# Patient Record
Sex: Female | Born: 1935
Health system: Southern US, Community
[De-identification: ages and names within clinical notes are randomized; demographics above are authoritative.]

## PROBLEM LIST (undated history)

## (undated) DIAGNOSIS — E079 Disorder of thyroid, unspecified: Secondary | ICD-10-CM

## (undated) DIAGNOSIS — K219 Gastro-esophageal reflux disease without esophagitis: Secondary | ICD-10-CM

## (undated) DIAGNOSIS — N289 Disorder of kidney and ureter, unspecified: Secondary | ICD-10-CM

## (undated) DIAGNOSIS — D649 Anemia, unspecified: Secondary | ICD-10-CM

## (undated) DIAGNOSIS — K8020 Calculus of gallbladder without cholecystitis without obstruction: Secondary | ICD-10-CM

## (undated) DIAGNOSIS — I10 Essential (primary) hypertension: Secondary | ICD-10-CM

## (undated) DIAGNOSIS — N186 End stage renal disease: Secondary | ICD-10-CM

## (undated) DIAGNOSIS — I82409 Acute embolism and thrombosis of unspecified deep veins of unspecified lower extremity: Secondary | ICD-10-CM

## (undated) HISTORY — PX: COLONOSCOPY: SHX174

## (undated) HISTORY — DX: End stage renal disease: N18.6

## (undated) HISTORY — PX: LIPOMA EXCISION: SHX5283

## (undated) HISTORY — DX: Anemia, unspecified: D64.9

## (undated) HISTORY — DX: Acute embolism and thrombosis of unspecified deep veins of unspecified lower extremity: I82.409

## (undated) HISTORY — PX: EYE SURGERY: SHX253

## (undated) HISTORY — PX: DILATION AND CURETTAGE OF UTERUS: SHX78

## (undated) HISTORY — DX: Gastro-esophageal reflux disease without esophagitis: K21.9

---
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
## 2015-05-25 DIAGNOSIS — H524 Presbyopia: Secondary | ICD-10-CM | POA: Diagnosis not present

## 2015-05-25 DIAGNOSIS — H26493 Other secondary cataract, bilateral: Secondary | ICD-10-CM | POA: Diagnosis not present

## 2015-06-13 DIAGNOSIS — H26492 Other secondary cataract, left eye: Secondary | ICD-10-CM | POA: Diagnosis not present

## 2015-06-20 DIAGNOSIS — R5382 Chronic fatigue, unspecified: Secondary | ICD-10-CM | POA: Diagnosis not present

## 2015-06-20 DIAGNOSIS — E785 Hyperlipidemia, unspecified: Secondary | ICD-10-CM | POA: Diagnosis not present

## 2015-06-20 DIAGNOSIS — E039 Hypothyroidism, unspecified: Secondary | ICD-10-CM | POA: Diagnosis not present

## 2015-06-20 DIAGNOSIS — Z0000 Encounter for general adult medical examination without abnormal findings: Secondary | ICD-10-CM | POA: Diagnosis not present

## 2015-06-20 DIAGNOSIS — R001 Bradycardia, unspecified: Secondary | ICD-10-CM | POA: Diagnosis not present

## 2015-06-20 DIAGNOSIS — R05 Cough: Secondary | ICD-10-CM | POA: Diagnosis not present

## 2015-06-20 DIAGNOSIS — M129 Arthropathy, unspecified: Secondary | ICD-10-CM | POA: Diagnosis not present

## 2015-06-20 DIAGNOSIS — E559 Vitamin D deficiency, unspecified: Secondary | ICD-10-CM | POA: Diagnosis not present

## 2015-06-20 DIAGNOSIS — N3000 Acute cystitis without hematuria: Secondary | ICD-10-CM | POA: Diagnosis not present

## 2015-08-03 DIAGNOSIS — N184 Chronic kidney disease, stage 4 (severe): Secondary | ICD-10-CM | POA: Diagnosis not present

## 2015-08-03 DIAGNOSIS — I10 Essential (primary) hypertension: Secondary | ICD-10-CM | POA: Diagnosis not present

## 2015-08-03 DIAGNOSIS — E039 Hypothyroidism, unspecified: Secondary | ICD-10-CM | POA: Diagnosis not present

## 2015-08-03 DIAGNOSIS — D649 Anemia, unspecified: Secondary | ICD-10-CM | POA: Diagnosis not present

## 2015-08-07 DIAGNOSIS — N184 Chronic kidney disease, stage 4 (severe): Secondary | ICD-10-CM | POA: Diagnosis not present

## 2015-08-07 DIAGNOSIS — E211 Secondary hyperparathyroidism, not elsewhere classified: Secondary | ICD-10-CM | POA: Diagnosis not present

## 2015-08-07 DIAGNOSIS — I10 Essential (primary) hypertension: Secondary | ICD-10-CM | POA: Diagnosis not present

## 2015-11-07 ENCOUNTER — Emergency Department (HOSPITAL_COMMUNITY)
Admission: EM | Admit: 2015-11-07 | Discharge: 2015-11-07 | Disposition: A | Discharge status: Home / Self Care | Payer: Medicare Other | Attending: Emergency Medicine | Admitting: Emergency Medicine

## 2015-11-07 ENCOUNTER — Encounter (HOSPITAL_COMMUNITY): Payer: Self-pay | Admitting: *Deleted

## 2015-11-07 ENCOUNTER — Emergency Department (HOSPITAL_COMMUNITY): Payer: Medicare Other

## 2015-11-07 DIAGNOSIS — Z79899 Other long term (current) drug therapy: Secondary | ICD-10-CM | POA: Insufficient documentation

## 2015-11-07 DIAGNOSIS — N189 Chronic kidney disease, unspecified: Secondary | ICD-10-CM | POA: Insufficient documentation

## 2015-11-07 DIAGNOSIS — I129 Hypertensive chronic kidney disease with stage 1 through stage 4 chronic kidney disease, or unspecified chronic kidney disease: Secondary | ICD-10-CM | POA: Diagnosis not present

## 2015-11-07 DIAGNOSIS — R0602 Shortness of breath: Secondary | ICD-10-CM | POA: Diagnosis not present

## 2015-11-07 DIAGNOSIS — R05 Cough: Secondary | ICD-10-CM | POA: Diagnosis not present

## 2015-11-07 DIAGNOSIS — R059 Cough, unspecified: Secondary | ICD-10-CM

## 2015-11-07 DIAGNOSIS — R0989 Other specified symptoms and signs involving the circulatory and respiratory systems: Secondary | ICD-10-CM | POA: Diagnosis not present

## 2015-11-07 DIAGNOSIS — R002 Palpitations: Secondary | ICD-10-CM | POA: Diagnosis not present

## 2015-11-07 HISTORY — DX: Disorder of kidney and ureter, unspecified: N28.9

## 2015-11-07 HISTORY — DX: Essential (primary) hypertension: I10

## 2015-11-07 HISTORY — DX: Disorder of thyroid, unspecified: E07.9

## 2015-11-07 LAB — I-STAT CHEM 8, ED
BUN: 41 mg/dL — ABNORMAL HIGH (ref 6–20)
CHLORIDE: 101 mmol/L (ref 101–111)
Calcium, Ion: 1.19 mmol/L (ref 1.12–1.23)
Creatinine, Ser: 2.3 mg/dL — ABNORMAL HIGH (ref 0.44–1.00)
Glucose, Bld: 123 mg/dL — ABNORMAL HIGH (ref 65–99)
HEMATOCRIT: 32 % — AB (ref 36.0–46.0)
Hemoglobin: 10.9 g/dL — ABNORMAL LOW (ref 12.0–15.0)
POTASSIUM: 4.2 mmol/L (ref 3.5–5.1)
SODIUM: 132 mmol/L — AB (ref 135–145)
TCO2: 23 mmol/L (ref 0–100)

## 2015-11-07 LAB — CBC WITH DIFFERENTIAL/PLATELET
BASOS PCT: 0 %
Basophils Absolute: 0 10*3/uL (ref 0.0–0.1)
EOS ABS: 0.2 10*3/uL (ref 0.0–0.7)
Eosinophils Relative: 3 %
HCT: 30.4 % — ABNORMAL LOW (ref 36.0–46.0)
HEMOGLOBIN: 11 g/dL — AB (ref 12.0–15.0)
LYMPHS ABS: 2.4 10*3/uL (ref 0.7–4.0)
Lymphocytes Relative: 36 %
MCH: 32.5 pg (ref 26.0–34.0)
MCHC: 36.2 g/dL — ABNORMAL HIGH (ref 30.0–36.0)
MCV: 89.9 fL (ref 78.0–100.0)
Monocytes Absolute: 0.4 10*3/uL (ref 0.1–1.0)
Monocytes Relative: 6 %
NEUTROS PCT: 55 %
Neutro Abs: 3.7 10*3/uL (ref 1.7–7.7)
Platelets: 223 10*3/uL (ref 150–400)
RBC: 3.38 MIL/uL — AB (ref 3.87–5.11)
RDW: 12.9 % (ref 11.5–15.5)
WBC: 6.7 10*3/uL (ref 4.0–10.5)

## 2015-11-07 LAB — BRAIN NATRIURETIC PEPTIDE: B NATRIURETIC PEPTIDE 5: 145.1 pg/mL — AB (ref 0.0–100.0)

## 2015-11-07 LAB — T4, FREE: FREE T4: 1.28 ng/dL — AB (ref 0.61–1.12)

## 2015-11-07 LAB — I-STAT TROPONIN, ED: Troponin i, poc: 0.01 ng/mL (ref 0.00–0.08)

## 2015-11-07 LAB — TSH: TSH: 4.195 u[IU]/mL (ref 0.350–4.500)

## 2015-11-07 LAB — MAGNESIUM: MAGNESIUM: 2.3 mg/dL (ref 1.7–2.4)

## 2015-11-07 MED ORDER — IPRATROPIUM-ALBUTEROL 0.5-2.5 (3) MG/3ML IN SOLN
RESPIRATORY_TRACT | Status: AC
  Filled 2015-11-07: qty 3

## 2015-11-07 MED ORDER — IPRATROPIUM-ALBUTEROL 0.5-2.5 (3) MG/3ML IN SOLN
3.0000 mL | Freq: Once | RESPIRATORY_TRACT | Status: AC
  Administered 2015-11-07: 3 mL via RESPIRATORY_TRACT

## 2015-11-07 MED ORDER — IPRATROPIUM-ALBUTEROL 0.5-2.5 (3) MG/3ML IN SOLN
3.0000 mL | Freq: Once | RESPIRATORY_TRACT | Status: AC
  Administered 2015-11-07: 3 mL via RESPIRATORY_TRACT
  Filled 2015-11-07: qty 3

## 2015-11-07 MED ORDER — ALBUTEROL SULFATE (2.5 MG/3ML) 0.083% IN NEBU: 5.0000 mg | INHALATION_SOLUTION | Freq: Once | RESPIRATORY_TRACT | Status: DC

## 2015-11-07 MED ORDER — PREDNISONE 20 MG PO TABS: 60.0000 mg | ORAL_TABLET | Freq: Every day | ORAL | Status: DC

## 2015-11-07 MED ORDER — ALBUTEROL SULFATE HFA 108 (90 BASE) MCG/ACT IN AERS
2.0000 | INHALATION_SPRAY | Freq: Once | RESPIRATORY_TRACT | Status: AC
  Administered 2015-11-07: 2 via RESPIRATORY_TRACT
  Filled 2015-11-07: qty 6.7

## 2015-11-07 NOTE — ED Notes (Signed)
Pt states that she woke up from sleep this am around 1:15am feeling short of breath and having palpations; pt states that she feels like she cannot catch her breath and that her heart is racing

## 2015-11-07 NOTE — ED Provider Notes (Signed)
CSN: [id number]     Arrival date & time 11/07/15  0305 History   First MD Initiated Contact with Patient 11/07/15 0319     Chief Complaint  Patient presents with  . Shortness of Breath     (Consider location/radiation/quality/duration/timing/severity/associated sxs/prior Treatment) HPI  Yvonne Powell is a 82 y.o. female with PMH HTN, Graves Disease, presenting with palpitations, SOB and throat tightening.  This started all of a sudden while asleep.  States this occurred once before 2 yrs ago and resolved with breathing treatment.  She denies any chest pain, SOB, or recent infection. She is visiting from [place].  She denies heart attack or history of clots.  She denies food being stuck or any unusual foods last night.  There are no further complaints.  10 Systems reviewed and are negative for acute change except as noted in the HPI.     Past Medical History  Diagnosis Date  . Hypertension   . Renal disorder   . Thyroid disease     graves dx   History reviewed. No pertinent past surgical history. No family history on file. Social History  Substance Use Topics  . Smoking status: Never Smoker   . Smokeless tobacco: None  . Alcohol Use: No   OB History    No data available     Review of Systems    Allergies  Compazine; Iodine; Penicillins; and Sulfa antibiotics  Home Medications   Prior to Admission medications   Not on File   BP 162/62 mmHg  Pulse 73  Temp(Src) 97.4 F (36.3 C) (Oral)  Resp 15  SpO2 98% Physical Exam  Constitutional: She is oriented to person, place, and time. She appears well-developed and well-nourished. No distress.  HENT:  Head: Normocephalic and atraumatic.  Nose: Nose normal.  Mouth/Throat: Oropharynx is clear and moist. No oropharyngeal exudate.  Eyes: Conjunctivae and EOM are normal. Pupils are equal, round, and reactive to light. No scleral icterus.  Neck: Normal range of motion. Neck supple. No JVD present. No tracheal deviation present.  No thyromegaly present.  Cardiovascular: Normal rate, regular rhythm and normal heart sounds.  Exam reveals no gallop and no friction rub.   No murmur heard. Pulmonary/Chest: Effort normal and breath sounds normal. No respiratory distress. She has no wheezes. She exhibits no tenderness.  Abdominal: Soft. Bowel sounds are normal. She exhibits no distension and no mass. There is no tenderness. There is no rebound and no guarding.  Musculoskeletal: Normal range of motion. She exhibits no edema or tenderness.  Lymphadenopathy:    She has no cervical adenopathy.  Neurological: She is alert and oriented to person, place, and time. No cranial nerve deficit. She exhibits normal muscle tone.  Skin: Skin is warm and dry. No rash noted. No erythema. No pallor.  Nursing note and vitals reviewed.   ED Course  Procedures (including critical care time) Labs Review Labs Reviewed  CBC WITH DIFFERENTIAL/PLATELET - Abnormal; Notable for the following:    RBC 3.38 (*)    Hemoglobin 11.0 (*)    HCT 30.4 (*)    MCHC 36.2 (*)    All other components within normal limits  I-STAT CHEM 8, ED - Abnormal; Notable for the following:    Sodium 132 (*)    BUN 41 (*)    Creatinine, Ser 2.30 (*)    Glucose, Bld 123 (*)    Hemoglobin 10.9 (*)    HCT 32.0 (*)    All other components within normal limits  MAGNESIUM  BRAIN NATRIURETIC PEPTIDE  T4, FREE  TSH  I-STAT TROPOININ, ED    Imaging Review Dg Chest 2 View  11/07/2015  CLINICAL DATA:  Acute onset of shortness of breath and heart palpitations. Initial encounter. EXAM: CHEST  2 VIEW COMPARISON:  None. FINDINGS: The lungs are well-aerated and clear. There is no evidence of focal opacification, pleural effusion or pneumothorax. The heart is normal in size; the mediastinal contour is within normal limits. No acute osseous abnormalities are seen. IMPRESSION: No acute cardiopulmonary process seen. Electronically Signed   By: Garald Balding M.D.   On: 11/07/2015  03:32   I have personally reviewed and evaluated these images and lab results as part of my medical decision-making.   EKG Interpretation   Date/Time:  Wednesday November 07 2015 03:31:31 EDT Ventricular Rate:  83 PR Interval:    QRS Duration: 97 QT Interval:  421 QTC Calculation: 495 R Axis:   0 Text Interpretation:  Sinus rhythm Interpretation limited secondary to  artifact STD seen in prior EKG likely from artifact Confirmed by Glynn Octave [phone]) on 11/07/2015 3:42:23 AM      MDM   Final diagnoses:  None   Patient presents to the ED for throat tightening and SOB.  Will obtain CXR, EKG and cardiac work  Up.  She was also given breathing treatment as she states this made it better in the past.    EKG does not show any signs of ischemia.  CXR is negative. Labs are unremarkable as patient has a history of CKD.  She is requesting another treatment for her throat tightness.  Again, I see no airway involvement and there was no choking history to suggest FB.  She states she was dx with tracheitis last time and got better with albuterol inhaler. This will be provided as well as prednisone for 5 days.  She appears well and in NAD. VS remain within her normal limits and she is safe for DC.  Everlene Balls, MD 11/07/15 [id number]

## 2015-11-07 NOTE — Discharge Instructions (Signed)
Cough, Adult Yvonne Powell, use albuterol inhaler 2 puffs, every 6 hours as needed.  Take steroids daily for 5 days.  See a primary care doctor within 3 days for close follow up. If symptoms worsen, come back to the ED immediately. Thank you.  A cough helps to clear your throat and lungs. A cough may last only 2-3 weeks (acute), or it may last longer than 8 weeks (chronic). Many different things can cause a cough. A cough may be a sign of an illness or another medical condition. HOME CARE  Pay attention to any changes in your cough.  Take medicines only as told by your doctor.  If you were prescribed an antibiotic medicine, take it as told by your doctor. Do not stop taking it even if you start to feel better.  Talk with your doctor before you try using a cough medicine.  Drink enough fluid to keep your pee (urine) clear or pale yellow.  If the air is dry, use a cold steam vaporizer or humidifier in your home.  Stay away from things that make you cough at work or at home.  If your cough is worse at night, try using extra pillows to raise your head up higher while you sleep.  Do not smoke, and try not to be around smoke. If you need help quitting, ask your doctor.  Do not have caffeine.  Do not drink alcohol.  Rest as needed. GET HELP IF:  You have new problems (symptoms).  You cough up yellow fluid (pus).  Your cough does not get better after 2-3 weeks, or your cough gets worse.  Medicine does not help your cough and you are not sleeping well.  You have pain that gets worse or pain that is not helped with medicine.  You have a fever.  You are losing weight and you do not know why.  You have night sweats. GET HELP RIGHT AWAY IF:  You cough up blood.  You have trouble breathing.  Your heartbeat is very fast.   This information is not intended to replace advice given to you by your health care provider. Make sure you discuss any questions you have with your health care  provider.   Document Released: 01/02/2011 Document Revised: 01/10/2015 Document Reviewed: 06/28/2014 Elsevier Interactive Patient Education [employer].

## 2015-11-28 ENCOUNTER — Emergency Department (HOSPITAL_COMMUNITY): Payer: Medicare Other

## 2015-11-28 ENCOUNTER — Emergency Department (HOSPITAL_COMMUNITY)
Admission: EM | Admit: 2015-11-28 | Discharge: 2015-11-28 | Disposition: A | Discharge status: Home / Self Care | Payer: Medicare Other | Attending: Physician Assistant | Admitting: Physician Assistant

## 2015-11-28 ENCOUNTER — Encounter (HOSPITAL_COMMUNITY): Payer: Self-pay | Admitting: Emergency Medicine

## 2015-11-28 DIAGNOSIS — R0602 Shortness of breath: Secondary | ICD-10-CM | POA: Diagnosis not present

## 2015-11-28 DIAGNOSIS — Z79899 Other long term (current) drug therapy: Secondary | ICD-10-CM | POA: Diagnosis not present

## 2015-11-28 DIAGNOSIS — K219 Gastro-esophageal reflux disease without esophagitis: Secondary | ICD-10-CM | POA: Insufficient documentation

## 2015-11-28 DIAGNOSIS — R0789 Other chest pain: Secondary | ICD-10-CM | POA: Insufficient documentation

## 2015-11-28 DIAGNOSIS — I10 Essential (primary) hypertension: Secondary | ICD-10-CM | POA: Insufficient documentation

## 2015-11-28 LAB — CBC WITH DIFFERENTIAL/PLATELET
Basophils Absolute: 0 10*3/uL (ref 0.0–0.1)
Basophils Relative: 0 %
EOS ABS: 0.1 10*3/uL (ref 0.0–0.7)
EOS PCT: 1 %
HCT: 29.1 % — ABNORMAL LOW (ref 36.0–46.0)
Hemoglobin: 10.4 g/dL — ABNORMAL LOW (ref 12.0–15.0)
LYMPHS ABS: 1.6 10*3/uL (ref 0.7–4.0)
LYMPHS PCT: 29 %
MCH: 32.4 pg (ref 26.0–34.0)
MCHC: 35.7 g/dL (ref 30.0–36.0)
MCV: 90.7 fL (ref 78.0–100.0)
MONO ABS: 0.4 10*3/uL (ref 0.1–1.0)
MONOS PCT: 8 %
Neutro Abs: 3.4 10*3/uL (ref 1.7–7.7)
Neutrophils Relative %: 62 %
PLATELETS: 174 10*3/uL (ref 150–400)
RBC: 3.21 MIL/uL — AB (ref 3.87–5.11)
RDW: 13.3 % (ref 11.5–15.5)
WBC: 5.5 10*3/uL (ref 4.0–10.5)

## 2015-11-28 LAB — COMPREHENSIVE METABOLIC PANEL
ALT: 18 U/L (ref 14–54)
ANION GAP: 8 (ref 5–15)
AST: 18 U/L (ref 15–41)
Albumin: 3.7 g/dL (ref 3.5–5.0)
Alkaline Phosphatase: 50 U/L (ref 38–126)
BUN: 46 mg/dL — ABNORMAL HIGH (ref 6–20)
CHLORIDE: 105 mmol/L (ref 101–111)
CO2: 21 mmol/L — AB (ref 22–32)
CREATININE: 2.44 mg/dL — AB (ref 0.44–1.00)
Calcium: 9.2 mg/dL (ref 8.9–10.3)
GFR, EST AFRICAN AMERICAN: 20 mL/min — AB (ref >60)
GFR, EST NON AFRICAN AMERICAN: 17 mL/min — AB (ref >60)
Glucose, Bld: 106 mg/dL — ABNORMAL HIGH (ref 65–99)
POTASSIUM: 4.4 mmol/L (ref 3.5–5.1)
SODIUM: 134 mmol/L — AB (ref 135–145)
Total Bilirubin: 0.9 mg/dL (ref 0.3–1.2)
Total Protein: 5.9 g/dL — ABNORMAL LOW (ref 6.5–8.1)

## 2015-11-28 LAB — I-STAT TROPONIN, ED: TROPONIN I, POC: 0.01 ng/mL (ref 0.00–0.08)

## 2015-11-28 MED ORDER — OMEPRAZOLE 20 MG PO CPDR: 20.0000 mg | DELAYED_RELEASE_CAPSULE | Freq: Every day | ORAL | 0 refills | Status: DC

## 2015-11-28 MED ORDER — GI COCKTAIL ~~LOC~~
30.0000 mL | Freq: Once | ORAL | Status: AC
  Administered 2015-11-28: 30 mL via ORAL
  Filled 2015-11-28: qty 30

## 2015-11-28 NOTE — ED Triage Notes (Signed)
Pt states she woke up this morning and felt like her throat was closing up on her  Pt states the feeling has increased throughout the day  Pt states she has used her inhaler without relief   Pt states she was seen here on the 5th of July for palpitations  Pt states this feels different

## 2015-11-28 NOTE — ED Provider Notes (Signed)
[facility] DEPT Provider Note   CSN: [id number] Arrival date & time: 11/28/15  [id number]  First Provider Contact:  First MD Initiated Contact with Patient 11/28/15 2155        History   Chief Complaint Chief Complaint  Patient presents with  . Shortness of Breath    HPI Yvonne Powell is a 82 y.o. female.  HPI   Patient is a very pleasant 82 year old female presenting with feeling of tightness in her throat. She was here recently with the same thing. Patient reports that it wakes her up in the middle of the night. It is associated with that bad taste in her mouth and trouble swallowing. It is not associated with exercise or radiation to jaw or shoulder. It is been constant since this morning upon wakening.  Patient had workup in the emergency department on July 5 for this and nothing was found. Patient was discharged home. Patient is returning home to state where she lives on Monday and she has follow-up with her primary care physician scheduled for then.  Patient had no chest pain. No shortness of breath.  Past Medical History:  Diagnosis Date  . Hypertension   . Renal disorder   . Thyroid disease    graves dx    There are no active problems to display for this patient.   History reviewed. No pertinent surgical history.  OB History    No data available       Home Medications    Prior to Admission medications   Medication Sig Start Date End Date Taking? Authorizing Provider  calcitRIOL (ROCALTROL) 0.25 MCG capsule Take 0.25 mcg by mouth every other day.   Yes Historical Provider, MD  cetirizine (ZYRTEC) 10 MG tablet Take 10 mg by mouth daily.   Yes Historical Provider, MD  diltiazem (TIAZAC) 300 MG 24 hr capsule Take 300 mg by mouth daily.   Yes Historical Provider, MD  docusate sodium (COLACE) 100 MG capsule Take 100 mg by mouth 2 (two) times daily.   Yes Historical Provider, MD  doxazosin (CARDURA) 8 MG tablet Take 8 mg by mouth daily.   Yes Historical  Provider, MD  Iron-FA-DSS-B Cmplx-Vit C (NEPHRON FA PO) Take 1 tablet by mouth daily.   Yes Historical Provider, MD  levothyroxine (SYNTHROID, LEVOTHROID) 88 MCG tablet Take 88 mcg by mouth daily before breakfast.   Yes Historical Provider, MD  Vitamin D, Ergocalciferol, (DRISDOL) 50000 units CAPS capsule Take 50,000 Units by mouth every 30 (thirty) days.   Yes Historical Provider, MD  predniSONE (DELTASONE) 20 MG tablet Take 3 tablets (60 mg total) by mouth daily. Patient not taking: Reported on 11/28/2015 11/07/15   Everlene Balls, MD    Family History History reviewed. No pertinent family history.  Social History Social History  Substance Use Topics  . Smoking status: Never Smoker  . Smokeless tobacco: Never Used  . Alcohol use No     Allergies   Compazine [prochlorperazine edisylate]; Iodine; Sulfa antibiotics; and Penicillins   Review of Systems Review of Systems  Constitutional: Negative for chills and fever.  HENT: Positive for trouble swallowing. Negative for ear pain, sinus pressure and sore throat.   Eyes: Negative for pain and visual disturbance.  Respiratory: Negative for cough and shortness of breath.   Cardiovascular: Negative for chest pain and palpitations.  Gastrointestinal: Negative for abdominal pain and vomiting.  Genitourinary: Negative for dysuria and hematuria.  Musculoskeletal: Negative for arthralgias and back pain.  Skin: Negative for color change and  rash.  Neurological: Negative for seizures and syncope.  All other systems reviewed and are negative.    Physical Exam Updated Vital Signs BP 160/64 (BP Location: Right Arm)   Pulse 66   Temp 97.5 F (36.4 C) (Oral)   Resp 16   SpO2 99%   Physical Exam  Constitutional: She appears well-developed and well-nourished. No distress.  HENT:  Head: Normocephalic and atraumatic.  Eyes: Conjunctivae are normal.  Neck: Neck supple.  Cardiovascular: Normal rate and regular rhythm.   No murmur  heard. Pulmonary/Chest: Effort normal and breath sounds normal. No respiratory distress.  Abdominal: Soft. There is no tenderness.  Musculoskeletal: She exhibits no edema.  Neurological: She is alert.  Skin: Skin is warm and dry.  Psychiatric: She has a normal mood and affect.  Nursing note and vitals reviewed.    ED Treatments / Results  Labs (all labs ordered are listed, but only abnormal results are displayed) Labs Reviewed  CBC WITH DIFFERENTIAL/PLATELET - Abnormal; Notable for the following:       Result Value   RBC 3.21 (*)    Hemoglobin 10.4 (*)    HCT 29.1 (*)    All other components within normal limits  COMPREHENSIVE METABOLIC PANEL - Abnormal; Notable for the following:    Sodium 134 (*)    CO2 21 (*)    Glucose, Bld 106 (*)    BUN 46 (*)    Creatinine, Ser 2.44 (*)    Total Protein 5.9 (*)    GFR calc non Af Amer 17 (*)    GFR calc Af Amer 20 (*)    All other components within normal limits  I-STAT TROPOININ, ED    EKG  EKG Interpretation  Date/Time:  Wednesday November 28 2015 22:00:33 EDT Ventricular Rate:  65 PR Interval:    QRS Duration: 92 QT Interval:  408 QTC Calculation: 425 R Axis:   38 Text Interpretation:  Sinus rhythm Borderline prolonged PR interval No significant change since last tracing Confirmed by Gerald Leitz ([id number]) on 11/28/2015 10:08:31 PM       Radiology Dg Chest 2 View  Result Date: 11/28/2015 CLINICAL DATA:  82 year old female with shortness of breath EXAM: CHEST  2 VIEW COMPARISON:  Chest radiograph dated 11/07/2015 FINDINGS: Two views of the chest demonstrate emphysematous changes lower lungs with flattening of the diaphragm. There is no focal consolidation, pleural effusion, or pneumothorax. The cardiac silhouette is within normal limits. No acute osseous pathology. IMPRESSION: No active cardiopulmonary disease. Electronically Signed   By: Anner Crete M.D.   On: 11/28/2015 22:19   Procedures Procedures (including  critical care time)  Medications Ordered in ED Medications  gi cocktail (Maalox,Lidocaine,Donnatal) (30 mLs Oral Given 11/28/15 2221)     Initial Impression / Assessment and Plan / ED Course  I have reviewed the triage vital signs and the nursing notes.  Pertinent labs & imaging results that were available during my care of the patient were reviewed by me and considered in my medical decision making (see chart for details).  Clinical Course    Patient's very friendly 82 year old female presenting with atypical pain in her esophagus with trouble swallowing. It sounds to me as this is GERD given the location, and accompanied symptoms. Patient has not had any chest pain or any radiation. However given the fact that she is elderly female we will do single troponin. Her pain has been going on for 7 hours and anticipate being positive if it were true Cardiac  pain. Her EKG is normal. We'll treat her present really for GERD and have her follow-up with her primary care physician on Monday. Patient has a normal physical exam vital signs and baseline labs.  Final Clinical Impressions(s) / ED Diagnoses   Final diagnoses:  None    New Prescriptions New Prescriptions   No medications on file     La Dibella Julio Alm, MD 11/28/15 2321

## 2015-11-28 NOTE — Discharge Instructions (Signed)
We are unsure what is causing her symptoms. However it may be that you're having increased acid. We want you to take this pill for 2 weeks and monitor symptoms. Please follow-up with your primary care physician on Monday as planned.

## 2015-11-28 NOTE — ED Notes (Signed)
Patient transported to X-ray 

## 2015-11-28 NOTE — Progress Notes (Signed)
Patient listed as having Medicare insurance without a pcp.  EDCM spoke to patient at bedside.  Patient reports she is visiting from [place] and has a pcp there.  No further EDCM needs at this time.

## 2015-11-28 NOTE — ED Notes (Signed)
Pt having complaints of SOB. Pt able to talk without difficulty and sats remain at 100%-RA. 2L of O2 placed for pt comfort.

## 2015-12-07 DIAGNOSIS — K219 Gastro-esophageal reflux disease without esophagitis: Secondary | ICD-10-CM | POA: Diagnosis not present

## 2015-12-07 DIAGNOSIS — R1084 Generalized abdominal pain: Secondary | ICD-10-CM | POA: Diagnosis not present

## 2015-12-14 DIAGNOSIS — D649 Anemia, unspecified: Secondary | ICD-10-CM | POA: Diagnosis not present

## 2015-12-14 DIAGNOSIS — I10 Essential (primary) hypertension: Secondary | ICD-10-CM | POA: Diagnosis not present

## 2015-12-14 DIAGNOSIS — N2581 Secondary hyperparathyroidism of renal origin: Secondary | ICD-10-CM | POA: Diagnosis not present

## 2015-12-14 DIAGNOSIS — E039 Hypothyroidism, unspecified: Secondary | ICD-10-CM | POA: Diagnosis not present

## 2015-12-14 DIAGNOSIS — N184 Chronic kidney disease, stage 4 (severe): Secondary | ICD-10-CM | POA: Diagnosis not present

## 2015-12-18 DIAGNOSIS — E871 Hypo-osmolality and hyponatremia: Secondary | ICD-10-CM | POA: Diagnosis not present

## 2015-12-18 DIAGNOSIS — N184 Chronic kidney disease, stage 4 (severe): Secondary | ICD-10-CM | POA: Diagnosis not present

## 2015-12-18 DIAGNOSIS — I10 Essential (primary) hypertension: Secondary | ICD-10-CM | POA: Diagnosis not present

## 2016-02-16 DIAGNOSIS — J358 Other chronic diseases of tonsils and adenoids: Secondary | ICD-10-CM | POA: Diagnosis not present

## 2016-02-16 DIAGNOSIS — E039 Hypothyroidism, unspecified: Secondary | ICD-10-CM | POA: Diagnosis not present

## 2016-02-16 DIAGNOSIS — N183 Chronic kidney disease, stage 3 (moderate): Secondary | ICD-10-CM | POA: Diagnosis not present

## 2016-02-16 DIAGNOSIS — I10 Essential (primary) hypertension: Secondary | ICD-10-CM | POA: Diagnosis not present

## 2016-02-22 DIAGNOSIS — R002 Palpitations: Secondary | ICD-10-CM | POA: Diagnosis not present

## 2016-02-22 DIAGNOSIS — J209 Acute bronchitis, unspecified: Secondary | ICD-10-CM | POA: Diagnosis not present

## 2016-02-22 DIAGNOSIS — T7840XA Allergy, unspecified, initial encounter: Secondary | ICD-10-CM | POA: Diagnosis not present

## 2016-02-22 DIAGNOSIS — T364X5A Adverse effect of tetracyclines, initial encounter: Secondary | ICD-10-CM | POA: Diagnosis not present

## 2016-03-17 DIAGNOSIS — N2581 Secondary hyperparathyroidism of renal origin: Secondary | ICD-10-CM | POA: Diagnosis not present

## 2016-03-17 DIAGNOSIS — E559 Vitamin D deficiency, unspecified: Secondary | ICD-10-CM | POA: Diagnosis not present

## 2016-03-17 DIAGNOSIS — D509 Iron deficiency anemia, unspecified: Secondary | ICD-10-CM | POA: Diagnosis not present

## 2016-03-17 DIAGNOSIS — N184 Chronic kidney disease, stage 4 (severe): Secondary | ICD-10-CM | POA: Diagnosis not present

## 2016-03-17 DIAGNOSIS — N183 Chronic kidney disease, stage 3 (moderate): Secondary | ICD-10-CM | POA: Diagnosis not present

## 2016-03-17 DIAGNOSIS — I10 Essential (primary) hypertension: Secondary | ICD-10-CM | POA: Diagnosis not present

## 2016-03-17 DIAGNOSIS — E1121 Type 2 diabetes mellitus with diabetic nephropathy: Secondary | ICD-10-CM | POA: Diagnosis not present

## 2016-03-17 DIAGNOSIS — E785 Hyperlipidemia, unspecified: Secondary | ICD-10-CM | POA: Diagnosis not present

## 2016-03-20 DIAGNOSIS — I10 Essential (primary) hypertension: Secondary | ICD-10-CM | POA: Diagnosis not present

## 2016-03-20 DIAGNOSIS — N184 Chronic kidney disease, stage 4 (severe): Secondary | ICD-10-CM | POA: Diagnosis not present

## 2016-04-14 DIAGNOSIS — L2089 Other atopic dermatitis: Secondary | ICD-10-CM | POA: Diagnosis not present

## 2016-04-14 DIAGNOSIS — L2084 Intrinsic (allergic) eczema: Secondary | ICD-10-CM | POA: Diagnosis not present

## 2016-04-14 DIAGNOSIS — T7840XA Allergy, unspecified, initial encounter: Secondary | ICD-10-CM | POA: Diagnosis not present

## 2016-04-14 DIAGNOSIS — D485 Neoplasm of uncertain behavior of skin: Secondary | ICD-10-CM | POA: Diagnosis not present

## 2016-04-14 DIAGNOSIS — L570 Actinic keratosis: Secondary | ICD-10-CM | POA: Diagnosis not present

## 2016-04-14 DIAGNOSIS — L299 Pruritus, unspecified: Secondary | ICD-10-CM | POA: Diagnosis not present

## 2016-04-15 DIAGNOSIS — R239 Unspecified skin changes: Secondary | ICD-10-CM | POA: Diagnosis not present

## 2016-04-15 DIAGNOSIS — L820 Inflamed seborrheic keratosis: Secondary | ICD-10-CM | POA: Diagnosis not present

## 2016-04-29 DIAGNOSIS — L2089 Other atopic dermatitis: Secondary | ICD-10-CM | POA: Diagnosis not present

## 2016-04-29 DIAGNOSIS — T7840XA Allergy, unspecified, initial encounter: Secondary | ICD-10-CM | POA: Diagnosis not present

## 2016-04-29 DIAGNOSIS — L2084 Intrinsic (allergic) eczema: Secondary | ICD-10-CM | POA: Diagnosis not present

## 2016-04-29 DIAGNOSIS — L570 Actinic keratosis: Secondary | ICD-10-CM | POA: Diagnosis not present

## 2016-04-29 DIAGNOSIS — L299 Pruritus, unspecified: Secondary | ICD-10-CM | POA: Diagnosis not present

## 2016-04-29 DIAGNOSIS — D485 Neoplasm of uncertain behavior of skin: Secondary | ICD-10-CM | POA: Diagnosis not present

## 2016-05-05 HISTORY — PX: LUMBAR EPIDURAL INJECTION: SHX1980

## 2016-05-30 DIAGNOSIS — N184 Chronic kidney disease, stage 4 (severe): Secondary | ICD-10-CM | POA: Diagnosis not present

## 2016-05-30 DIAGNOSIS — E559 Vitamin D deficiency, unspecified: Secondary | ICD-10-CM | POA: Diagnosis not present

## 2016-05-30 DIAGNOSIS — E1121 Type 2 diabetes mellitus with diabetic nephropathy: Secondary | ICD-10-CM | POA: Diagnosis not present

## 2016-05-30 DIAGNOSIS — N183 Chronic kidney disease, stage 3 (moderate): Secondary | ICD-10-CM | POA: Diagnosis not present

## 2016-05-30 DIAGNOSIS — D509 Iron deficiency anemia, unspecified: Secondary | ICD-10-CM | POA: Diagnosis not present

## 2016-06-03 DIAGNOSIS — J019 Acute sinusitis, unspecified: Secondary | ICD-10-CM | POA: Diagnosis not present

## 2016-06-03 DIAGNOSIS — K1120 Sialoadenitis, unspecified: Secondary | ICD-10-CM | POA: Diagnosis not present

## 2016-06-03 DIAGNOSIS — J209 Acute bronchitis, unspecified: Secondary | ICD-10-CM | POA: Diagnosis not present

## 2016-06-04 DIAGNOSIS — I10 Essential (primary) hypertension: Secondary | ICD-10-CM | POA: Diagnosis not present

## 2016-06-04 DIAGNOSIS — N184 Chronic kidney disease, stage 4 (severe): Secondary | ICD-10-CM | POA: Diagnosis not present

## 2016-06-04 DIAGNOSIS — R809 Proteinuria, unspecified: Secondary | ICD-10-CM | POA: Diagnosis not present

## 2016-06-06 DIAGNOSIS — K1120 Sialoadenitis, unspecified: Secondary | ICD-10-CM | POA: Diagnosis not present

## 2016-06-06 DIAGNOSIS — J209 Acute bronchitis, unspecified: Secondary | ICD-10-CM | POA: Diagnosis not present

## 2016-06-06 DIAGNOSIS — J019 Acute sinusitis, unspecified: Secondary | ICD-10-CM | POA: Diagnosis not present

## 2016-06-24 DIAGNOSIS — N189 Chronic kidney disease, unspecified: Secondary | ICD-10-CM | POA: Diagnosis not present

## 2016-06-24 DIAGNOSIS — N281 Cyst of kidney, acquired: Secondary | ICD-10-CM | POA: Diagnosis not present

## 2016-07-01 DIAGNOSIS — N281 Cyst of kidney, acquired: Secondary | ICD-10-CM | POA: Diagnosis not present

## 2016-07-01 DIAGNOSIS — K8080 Other cholelithiasis without obstruction: Secondary | ICD-10-CM | POA: Diagnosis not present

## 2016-07-04 DIAGNOSIS — Z23 Encounter for immunization: Secondary | ICD-10-CM | POA: Diagnosis not present

## 2016-07-04 DIAGNOSIS — R001 Bradycardia, unspecified: Secondary | ICD-10-CM | POA: Diagnosis not present

## 2016-07-04 DIAGNOSIS — C649 Malignant neoplasm of unspecified kidney, except renal pelvis: Secondary | ICD-10-CM | POA: Diagnosis not present

## 2016-07-04 DIAGNOSIS — E559 Vitamin D deficiency, unspecified: Secondary | ICD-10-CM | POA: Diagnosis not present

## 2016-07-04 DIAGNOSIS — Z0000 Encounter for general adult medical examination without abnormal findings: Secondary | ICD-10-CM | POA: Diagnosis not present

## 2016-07-04 DIAGNOSIS — N183 Chronic kidney disease, stage 3 (moderate): Secondary | ICD-10-CM | POA: Diagnosis not present

## 2016-07-04 DIAGNOSIS — R05 Cough: Secondary | ICD-10-CM | POA: Diagnosis not present

## 2016-07-04 DIAGNOSIS — E039 Hypothyroidism, unspecified: Secondary | ICD-10-CM | POA: Diagnosis not present

## 2016-07-04 DIAGNOSIS — M810 Age-related osteoporosis without current pathological fracture: Secondary | ICD-10-CM | POA: Diagnosis not present

## 2016-07-04 DIAGNOSIS — E785 Hyperlipidemia, unspecified: Secondary | ICD-10-CM | POA: Diagnosis not present

## 2016-07-10 DIAGNOSIS — N2889 Other specified disorders of kidney and ureter: Secondary | ICD-10-CM | POA: Diagnosis not present

## 2016-07-10 DIAGNOSIS — R3129 Other microscopic hematuria: Secondary | ICD-10-CM | POA: Diagnosis not present

## 2016-07-14 DIAGNOSIS — L259 Unspecified contact dermatitis, unspecified cause: Secondary | ICD-10-CM | POA: Diagnosis not present

## 2016-07-14 DIAGNOSIS — L570 Actinic keratosis: Secondary | ICD-10-CM | POA: Diagnosis not present

## 2016-07-15 DIAGNOSIS — H26491 Other secondary cataract, right eye: Secondary | ICD-10-CM | POA: Diagnosis not present

## 2016-07-15 DIAGNOSIS — H04123 Dry eye syndrome of bilateral lacrimal glands: Secondary | ICD-10-CM | POA: Diagnosis not present

## 2016-07-15 DIAGNOSIS — H524 Presbyopia: Secondary | ICD-10-CM | POA: Diagnosis not present

## 2016-10-28 DIAGNOSIS — N184 Chronic kidney disease, stage 4 (severe): Secondary | ICD-10-CM | POA: Diagnosis not present

## 2016-10-28 DIAGNOSIS — D638 Anemia in other chronic diseases classified elsewhere: Secondary | ICD-10-CM | POA: Diagnosis not present

## 2016-10-28 DIAGNOSIS — M4716 Other spondylosis with myelopathy, lumbar region: Secondary | ICD-10-CM | POA: Diagnosis not present

## 2016-10-28 DIAGNOSIS — I10 Essential (primary) hypertension: Secondary | ICD-10-CM | POA: Diagnosis not present

## 2016-10-28 DIAGNOSIS — M1612 Unilateral primary osteoarthritis, left hip: Secondary | ICD-10-CM | POA: Diagnosis not present

## 2016-10-28 DIAGNOSIS — E559 Vitamin D deficiency, unspecified: Secondary | ICD-10-CM | POA: Diagnosis not present

## 2016-10-28 DIAGNOSIS — N2889 Other specified disorders of kidney and ureter: Secondary | ICD-10-CM | POA: Diagnosis not present

## 2016-11-25 DIAGNOSIS — I10 Essential (primary) hypertension: Secondary | ICD-10-CM | POA: Diagnosis not present

## 2016-11-25 DIAGNOSIS — N184 Chronic kidney disease, stage 4 (severe): Secondary | ICD-10-CM | POA: Diagnosis not present

## 2016-11-25 DIAGNOSIS — M1612 Unilateral primary osteoarthritis, left hip: Secondary | ICD-10-CM | POA: Diagnosis not present

## 2016-12-03 ENCOUNTER — Ambulatory Visit
Admission: RE | Admit: 2016-12-03 | Discharge: 2016-12-03 | Disposition: A | Discharge status: Home / Self Care | Payer: Medicare Other | Source: Ambulatory Visit | Attending: Internal Medicine | Admitting: Internal Medicine

## 2016-12-03 ENCOUNTER — Other Ambulatory Visit: Payer: Self-pay | Admitting: Internal Medicine

## 2016-12-03 DIAGNOSIS — R2230 Localized swelling, mass and lump, unspecified upper limb: Secondary | ICD-10-CM | POA: Diagnosis not present

## 2016-12-03 DIAGNOSIS — M79632 Pain in left forearm: Secondary | ICD-10-CM | POA: Diagnosis not present

## 2016-12-03 DIAGNOSIS — N184 Chronic kidney disease, stage 4 (severe): Secondary | ICD-10-CM | POA: Diagnosis not present

## 2016-12-03 DIAGNOSIS — N2581 Secondary hyperparathyroidism of renal origin: Secondary | ICD-10-CM | POA: Diagnosis not present

## 2016-12-03 DIAGNOSIS — I10 Essential (primary) hypertension: Secondary | ICD-10-CM | POA: Diagnosis not present

## 2016-12-03 DIAGNOSIS — D638 Anemia in other chronic diseases classified elsewhere: Secondary | ICD-10-CM | POA: Diagnosis not present

## 2016-12-03 DIAGNOSIS — M19042 Primary osteoarthritis, left hand: Secondary | ICD-10-CM | POA: Diagnosis not present

## 2016-12-03 DIAGNOSIS — D631 Anemia in chronic kidney disease: Secondary | ICD-10-CM | POA: Diagnosis not present

## 2017-02-02 DIAGNOSIS — D631 Anemia in chronic kidney disease: Secondary | ICD-10-CM | POA: Diagnosis not present

## 2017-02-02 DIAGNOSIS — N184 Chronic kidney disease, stage 4 (severe): Secondary | ICD-10-CM | POA: Diagnosis not present

## 2017-02-02 DIAGNOSIS — N281 Cyst of kidney, acquired: Secondary | ICD-10-CM | POA: Diagnosis not present

## 2017-02-02 DIAGNOSIS — Z23 Encounter for immunization: Secondary | ICD-10-CM | POA: Diagnosis not present

## 2017-02-02 DIAGNOSIS — N2581 Secondary hyperparathyroidism of renal origin: Secondary | ICD-10-CM | POA: Diagnosis not present

## 2017-02-12 ENCOUNTER — Other Ambulatory Visit: Payer: Self-pay

## 2017-02-12 DIAGNOSIS — N185 Chronic kidney disease, stage 5: Secondary | ICD-10-CM

## 2017-03-12 ENCOUNTER — Ambulatory Visit (HOSPITAL_COMMUNITY)
Admission: RE | Admit: 2017-03-12 | Discharge: 2017-03-12 | Disposition: A | Discharge status: Home / Self Care | Payer: Medicare Other | Source: Ambulatory Visit | Attending: Vascular Surgery | Admitting: Vascular Surgery

## 2017-03-12 ENCOUNTER — Encounter: Payer: Self-pay | Admitting: Vascular Surgery

## 2017-03-12 ENCOUNTER — Ambulatory Visit (INDEPENDENT_AMBULATORY_CARE_PROVIDER_SITE_OTHER): Payer: Medicare Other | Admitting: Vascular Surgery

## 2017-03-12 ENCOUNTER — Encounter: Payer: Self-pay | Admitting: *Deleted

## 2017-03-12 ENCOUNTER — Ambulatory Visit (INDEPENDENT_AMBULATORY_CARE_PROVIDER_SITE_OTHER)
Admission: RE | Admit: 2017-03-12 | Discharge: 2017-03-12 | Disposition: A | Discharge status: Home / Self Care | Payer: Medicare Other | Source: Ambulatory Visit | Attending: Vascular Surgery | Admitting: Vascular Surgery

## 2017-03-12 VITALS — BP 164/68 | HR 67 | Temp 96.7°F | Resp 18 | Wt 129.2 lb

## 2017-03-12 DIAGNOSIS — N185 Chronic kidney disease, stage 5: Secondary | ICD-10-CM | POA: Insufficient documentation

## 2017-03-12 DIAGNOSIS — N184 Chronic kidney disease, stage 4 (severe): Secondary | ICD-10-CM

## 2017-03-12 NOTE — H&P (View-Only) (Signed)
Referring Physician: Dr Hollie Salk Patient name: Yvonne Powell MRN: [id number] DOB: 1928/07/12 Sex: female  REASON FOR CONSULT: Hemodialysis access  HPI: Yvonne Powell is a 83 y.o. female referred for placement of long-term hemodialysis access. The patient is left-handed. She has had no prior access procedures. She is not currently on hemodialysis. Her renal failure is thought to be secondary to hypertension. She is CKD4.  Past Medical History:  Diagnosis Date  . Anemia   . DVT (deep venous thrombosis) ([place])   . Hypertension   . Renal disorder   . Thyroid disease    graves dx   History reviewed. No pertinent surgical history.  History reviewed. No pertinent family history.  SOCIAL HISTORY: Social History   Socioeconomic History  . Marital status: Divorced    Spouse name: Not on file  . Number of children: Not on file  . Years of education: Not on file  . Highest education level: Not on file  Social Needs  . Financial resource strain: Not on file  . Food insecurity - worry: Not on file  . Food insecurity - inability: Not on file  . Transportation needs - medical: Not on file  . Transportation needs - non-medical: Not on file  Occupational History  . Not on file  Tobacco Use  . Smoking status: Never Smoker  . Smokeless tobacco: Never Used  Substance and Sexual Activity  . Alcohol use: No  . Drug use: No  . Sexual activity: Not on file  Other Topics Concern  . Not on file  Social History Narrative  . Not on file    Current Outpatient Medications  Medication Sig Dispense Refill  . calcitRIOL (ROCALTROL) 0.25 MCG capsule Take 0.25 mcg by mouth every other day.    . diltiazem (TIAZAC) 300 MG 24 hr capsule Take 300 mg by mouth daily.    Marland Kitchen doxazosin (CARDURA) 8 MG tablet Take 8 mg by mouth daily.    . Iron-FA-DSS-B Cmplx-Vit C (NEPHRON FA PO) Take 1 tablet by mouth daily.    Marland Kitchen levothyroxine (SYNTHROID, LEVOTHROID) 88 MCG tablet Take 88 mcg by mouth daily before  breakfast.    . Vitamin D, Ergocalciferol, (DRISDOL) 50000 units CAPS capsule Take 50,000 Units by mouth every 30 (thirty) days.    . cetirizine (ZYRTEC) 10 MG tablet Take 10 mg by mouth daily.    Marland Kitchen docusate sodium (COLACE) 100 MG capsule Take 100 mg by mouth 2 (two) times daily.    Marland Kitchen omeprazole (PRILOSEC) 20 MG capsule Take 1 capsule (20 mg total) by mouth daily. (Patient not taking: Reported on 03/12/2017) 20 capsule 0  . predniSONE (DELTASONE) 20 MG tablet Take 3 tablets (60 mg total) by mouth daily. (Patient not taking: Reported on 11/28/2015) 15 tablet 0   No current facility-administered medications for this visit.     ROS:   General:  No weight loss, Fever, chills  HEENT: No recent headaches, no nasal bleeding, no visual changes, no sore throat  Neurologic: No dizziness, blackouts, seizures. No recent symptoms of stroke or mini- stroke. No recent episodes of slurred speech, or temporary blindness.  Cardiac: No recent episodes of chest pain/pressure, no shortness of breath at rest.  + shortness of breath with exertion.  Denies history of atrial fibrillation or irregular heartbeat  Vascular: No history of rest pain in feet.  No history of claudication.  No history of non-healing ulcer, +history of DVT   Pulmonary: No home oxygen, no productive cough, no hemoptysis,  No asthma or wheezing  Musculoskeletal:  [ ]  Arthritis, [ ]  Low back pain,  [ ]  Joint pain  Hematologic:No history of hypercoagulable state.  No history of easy bleeding.  + history of anemia  Gastrointestinal: No hematochezia or melena,  No gastroesophageal reflux, no trouble swallowing  Urinary: [X]  chronic Kidney disease, [ ]  on HD - [ ]  MWF or [ ]  TTHS, [ ]  Burning with urination, [ ]  Frequent urination, [ ]  Difficulty urinating;   Skin: No rashes  Psychological: No history of anxiety,  No history of depression   Physical Examination  Vitals:   03/12/17 1243 03/12/17 1244  BP: (!) 162/67 (!) 164/68  Pulse:  67   Resp: 18   Temp: (!) 96.7 F (35.9 C)   TempSrc: Oral   SpO2: 98%   Weight: 129 lb 3.2 oz (58.6 kg)     There is no height or weight on file to calculate BMI.  General:  Alert and oriented, no acute distress Cardiac: Regular Rate and Rhythm  Extremity Pulses:  2+ radial, brachial pulses bilaterally Musculoskeletal: No deformity or edema  Neurologic: Upper and lower extremity motor 5/5 and symmetric  DATA:  Patient had bilateral upper extremity vein mapping as well as approximate arterial duplex exam. Radial artery was 3 mm on the left 2 on the right brachial artery was 4 mm bilaterally upper arm cephalic vein was 3 mm on the right poor quality in the forearm right basilic vein was 4-5 mm. On the left side the left cephalic vein was 3 mm throughout most of its course except at the wrist where it dropped down to 2 mm the basilic vein was 3-4 mm  ASSESSMENT:  Patient needs long-term hemodialysis access. Since she is left-handed we will go in the nondominant arm first. She has an upper arm cephalic vein that appears to be of good quality.   PLAN:  Right brachiocephalic AV fistula planned for 04/07/2017 since the patient wanted to wait until after Thanksgiving. Risks benefits possible complications and procedure details including but not limited to bleeding infection non-maturation of the fistula ischemic steal were all explained to the patient today. She understands and agrees to proceed.   [name], MD [facility] Office: [phone] Pager: [phone]

## 2017-03-12 NOTE — Progress Notes (Signed)
Referring Physician: Dr Hollie Salk Patient name: Yvonne Powell MRN: [id number] DOB: 1928-06-25 Sex: female  REASON FOR CONSULT: Hemodialysis access  HPI: Yvonne Powell is a 83 y.o. female referred for placement of long-term hemodialysis access. The patient is left-handed. She has had no prior access procedures. She is not currently on hemodialysis. Her renal failure is thought to be secondary to hypertension. She is CKD4.  Past Medical History:  Diagnosis Date  . Anemia   . DVT (deep venous thrombosis) ([facility])   . Hypertension   . Renal disorder   . Thyroid disease    graves dx   History reviewed. No pertinent surgical history.  History reviewed. No pertinent family history.  SOCIAL HISTORY: Social History   Socioeconomic History  . Marital status: Divorced    Spouse name: Not on file  . Number of children: Not on file  . Years of education: Not on file  . Highest education level: Not on file  Social Needs  . Financial resource strain: Not on file  . Food insecurity - worry: Not on file  . Food insecurity - inability: Not on file  . Transportation needs - medical: Not on file  . Transportation needs - non-medical: Not on file  Occupational History  . Not on file  Tobacco Use  . Smoking status: Never Smoker  . Smokeless tobacco: Never Used  Substance and Sexual Activity  . Alcohol use: No  . Drug use: No  . Sexual activity: Not on file  Other Topics Concern  . Not on file  Social History Narrative  . Not on file    Current Outpatient Medications  Medication Sig Dispense Refill  . calcitRIOL (ROCALTROL) 0.25 MCG capsule Take 0.25 mcg by mouth every other day.    . diltiazem (TIAZAC) 300 MG 24 hr capsule Take 300 mg by mouth daily.    Marland Kitchen doxazosin (CARDURA) 8 MG tablet Take 8 mg by mouth daily.    . Iron-FA-DSS-B Cmplx-Vit C (NEPHRON FA PO) Take 1 tablet by mouth daily.    Marland Kitchen levothyroxine (SYNTHROID, LEVOTHROID) 88 MCG tablet Take 88 mcg by mouth daily before  breakfast.    . Vitamin D, Ergocalciferol, (DRISDOL) 50000 units CAPS capsule Take 50,000 Units by mouth every 30 (thirty) days.    . cetirizine (ZYRTEC) 10 MG tablet Take 10 mg by mouth daily.    Marland Kitchen docusate sodium (COLACE) 100 MG capsule Take 100 mg by mouth 2 (two) times daily.    Marland Kitchen omeprazole (PRILOSEC) 20 MG capsule Take 1 capsule (20 mg total) by mouth daily. (Patient not taking: Reported on 03/12/2017) 20 capsule 0  . predniSONE (DELTASONE) 20 MG tablet Take 3 tablets (60 mg total) by mouth daily. (Patient not taking: Reported on 11/28/2015) 15 tablet 0   No current facility-administered medications for this visit.     ROS:   General:  No weight loss, Fever, chills  HEENT: No recent headaches, no nasal bleeding, no visual changes, no sore throat  Neurologic: No dizziness, blackouts, seizures. No recent symptoms of stroke or mini- stroke. No recent episodes of slurred speech, or temporary blindness.  Cardiac: No recent episodes of chest pain/pressure, no shortness of breath at rest.  + shortness of breath with exertion.  Denies history of atrial fibrillation or irregular heartbeat  Vascular: No history of rest pain in feet.  No history of claudication.  No history of non-healing ulcer, +history of DVT   Pulmonary: No home oxygen, no productive cough, no hemoptysis,  No asthma or wheezing  Musculoskeletal:  [ ]  Arthritis, [ ]  Low back pain,  [ ]  Joint pain  Hematologic:No history of hypercoagulable state.  No history of easy bleeding.  + history of anemia  Gastrointestinal: No hematochezia or melena,  No gastroesophageal reflux, no trouble swallowing  Urinary: [X]  chronic Kidney disease, [ ]  on HD - [ ]  MWF or [ ]  TTHS, [ ]  Burning with urination, [ ]  Frequent urination, [ ]  Difficulty urinating;   Skin: No rashes  Psychological: No history of anxiety,  No history of depression   Physical Examination  Vitals:   03/12/17 1243 03/12/17 1244  BP: (!) 162/67 (!) 164/68  Pulse:  67   Resp: 18   Temp: (!) 96.7 F (35.9 C)   TempSrc: Oral   SpO2: 98%   Weight: 129 lb 3.2 oz (58.6 kg)     There is no height or weight on file to calculate BMI.  General:  Alert and oriented, no acute distress Cardiac: Regular Rate and Rhythm  Extremity Pulses:  2+ radial, brachial pulses bilaterally Musculoskeletal: No deformity or edema  Neurologic: Upper and lower extremity motor 5/5 and symmetric  DATA:  Patient had bilateral upper extremity vein mapping as well as approximate arterial duplex exam. Radial artery was 3 mm on the left 2 on the right brachial artery was 4 mm bilaterally upper arm cephalic vein was 3 mm on the right poor quality in the forearm right basilic vein was 4-5 mm. On the left side the left cephalic vein was 3 mm throughout most of its course except at the wrist where it dropped down to 2 mm the basilic vein was 3-4 mm  ASSESSMENT:  Patient needs long-term hemodialysis access. Since she is left-handed we will go in the nondominant arm first. She has an upper arm cephalic vein that appears to be of good quality.   PLAN:  Right brachiocephalic AV fistula planned for 04/07/2017 since the patient wanted to wait until after Thanksgiving. Risks benefits possible complications and procedure details including but not limited to bleeding infection non-maturation of the fistula ischemic steal were all explained to the patient today. She understands and agrees to proceed.   [name], MD [facility] Office: [phone] Pager: [phone]

## 2017-03-13 ENCOUNTER — Encounter: Payer: Self-pay | Admitting: Nephrology

## 2017-04-01 ENCOUNTER — Other Ambulatory Visit: Payer: Self-pay | Admitting: *Deleted

## 2017-04-03 ENCOUNTER — Encounter (HOSPITAL_COMMUNITY): Payer: Self-pay | Admitting: *Deleted

## 2017-04-03 ENCOUNTER — Other Ambulatory Visit: Payer: Self-pay

## 2017-04-03 NOTE — Progress Notes (Signed)
Gave patient pre-op instructions patient verbalized understanding of instructions and had not further questions.  7 days prior to surgery STOP taking any Aspirin(unless otherwise instructed by your surgeon), Aleve, Naproxen, Ibuprofen, Motrin, Advil, Goody's, BC's, all herbal medications, fish oil, and all vitamins

## 2017-04-06 ENCOUNTER — Encounter (HOSPITAL_COMMUNITY): Payer: Self-pay | Admitting: Anesthesiology

## 2017-04-06 MED ORDER — SODIUM CHLORIDE 0.9 % IV SOLN
1250.0000 mg | INTRAVENOUS | Status: DC
  Filled 2017-04-06: qty 1250

## 2017-04-06 NOTE — Anesthesia Preprocedure Evaluation (Addendum)
Anesthesia Evaluation  Patient identified by MRN, date of birth, ID band Patient awake    Reviewed: Allergy & Precautions, NPO status , Patient's Chart, lab work & pertinent test results  Airway Mallampati: III  TM Distance: >3 FB Neck ROM: Full    Dental  (+) Teeth Intact   Pulmonary neg pulmonary ROS,    Pulmonary exam normal breath sounds clear to auscultation       Cardiovascular hypertension, Pt. on medications Normal cardiovascular exam Rhythm:Regular Rate:Normal     Neuro/Psych negative neurological ROS  negative psych ROS   GI/Hepatic negative GI ROS, Neg liver ROS,   Endo/Other  negative endocrine ROS  Renal/GU ESRFRenal disease  negative genitourinary   Musculoskeletal Low back pain    Abdominal   Peds  Hematology  (+) anemia ,   Anesthesia Other Findings   Reproductive/Obstetrics                            Anesthesia Physical Anesthesia Plan  ASA: III  Anesthesia Plan: MAC   Post-op Pain Management:    Induction: Intravenous  PONV Risk Score and Plan: 2 and Ondansetron and Propofol infusion  Airway Management Planned: Natural Airway, Nasal Cannula and Simple Face Mask  Additional Equipment:   Intra-op Plan:   Post-operative Plan:   Informed Consent: I have reviewed the patients History and Physical, chart, labs and discussed the procedure including the risks, benefits and alternatives for the proposed anesthesia with the patient or authorized representative who has indicated his/her understanding and acceptance.   Dental advisory given  Plan Discussed with: CRNA, Anesthesiologist and Surgeon  Anesthesia Plan Comments:        Anesthesia Quick Evaluation

## 2017-04-07 ENCOUNTER — Ambulatory Visit (HOSPITAL_COMMUNITY): Payer: Medicare Other | Admitting: Certified Registered Nurse Anesthetist

## 2017-04-07 ENCOUNTER — Other Ambulatory Visit: Payer: Self-pay

## 2017-04-07 ENCOUNTER — Encounter (HOSPITAL_COMMUNITY): Payer: Self-pay | Admitting: *Deleted

## 2017-04-07 ENCOUNTER — Encounter (HOSPITAL_COMMUNITY)
Admission: RE | Disposition: A | Discharge status: Home Health | Payer: Self-pay | Source: Ambulatory Visit | Attending: Vascular Surgery

## 2017-04-07 ENCOUNTER — Telehealth: Payer: Self-pay | Admitting: Vascular Surgery

## 2017-04-07 ENCOUNTER — Ambulatory Visit (HOSPITAL_COMMUNITY)
Admission: RE | Admit: 2017-04-07 | Discharge: 2017-04-07 | Disposition: A | Discharge status: Home Health | Payer: Medicare Other | Source: Ambulatory Visit | Attending: Vascular Surgery | Admitting: Vascular Surgery

## 2017-04-07 DIAGNOSIS — M545 Low back pain: Secondary | ICD-10-CM | POA: Insufficient documentation

## 2017-04-07 DIAGNOSIS — I129 Hypertensive chronic kidney disease with stage 1 through stage 4 chronic kidney disease, or unspecified chronic kidney disease: Secondary | ICD-10-CM | POA: Insufficient documentation

## 2017-04-07 DIAGNOSIS — I120 Hypertensive chronic kidney disease with stage 5 chronic kidney disease or end stage renal disease: Secondary | ICD-10-CM | POA: Diagnosis not present

## 2017-04-07 DIAGNOSIS — Z86718 Personal history of other venous thrombosis and embolism: Secondary | ICD-10-CM | POA: Insufficient documentation

## 2017-04-07 DIAGNOSIS — Z79899 Other long term (current) drug therapy: Secondary | ICD-10-CM | POA: Diagnosis not present

## 2017-04-07 DIAGNOSIS — D631 Anemia in chronic kidney disease: Secondary | ICD-10-CM | POA: Insufficient documentation

## 2017-04-07 DIAGNOSIS — E0500 Thyrotoxicosis with diffuse goiter without thyrotoxic crisis or storm: Secondary | ICD-10-CM | POA: Diagnosis not present

## 2017-04-07 DIAGNOSIS — D649 Anemia, unspecified: Secondary | ICD-10-CM | POA: Diagnosis not present

## 2017-04-07 DIAGNOSIS — N184 Chronic kidney disease, stage 4 (severe): Secondary | ICD-10-CM | POA: Insufficient documentation

## 2017-04-07 DIAGNOSIS — N186 End stage renal disease: Secondary | ICD-10-CM | POA: Diagnosis not present

## 2017-04-07 HISTORY — PX: AV FISTULA PLACEMENT: SHX1204

## 2017-04-07 LAB — POCT I-STAT 4, (NA,K, GLUC, HGB,HCT)
Glucose, Bld: 110 mg/dL — ABNORMAL HIGH (ref 65–99)
HCT: 28 % — ABNORMAL LOW (ref 36.0–46.0)
HEMOGLOBIN: 9.5 g/dL — AB (ref 12.0–15.0)
POTASSIUM: 4.6 mmol/L (ref 3.5–5.1)
Sodium: 140 mmol/L (ref 135–145)

## 2017-04-07 SURGERY — ARTERIOVENOUS (AV) FISTULA CREATION
Anesthesia: Monitor Anesthesia Care | Site: Arm Upper | Laterality: Right

## 2017-04-07 MED ORDER — LIDOCAINE HCL 1 % IJ SOLN
INTRAMUSCULAR | Status: AC
  Filled 2017-04-07: qty 20

## 2017-04-07 MED ORDER — SODIUM CHLORIDE 0.9 % IV SOLN
INTRAVENOUS | Status: DC | PRN
  Administered 2017-04-07: 1250 mg via INTRAVENOUS

## 2017-04-07 MED ORDER — ONDANSETRON HCL 4 MG/2ML IJ SOLN
INTRAMUSCULAR | Status: AC
  Filled 2017-04-07: qty 2

## 2017-04-07 MED ORDER — OXYCODONE-ACETAMINOPHEN 5-325 MG PO TABS: 1.0000 | ORAL_TABLET | Freq: Three times a day (TID) | ORAL | 0 refills | Status: DC | PRN

## 2017-04-07 MED ORDER — HEPARIN SODIUM (PORCINE) 1000 UNIT/ML IJ SOLN
INTRAMUSCULAR | Status: DC | PRN
  Administered 2017-04-07: 3000 [IU] via INTRAVENOUS

## 2017-04-07 MED ORDER — OXYCODONE HCL 5 MG PO TABS: 5.0000 mg | ORAL_TABLET | Freq: Once | ORAL | Status: DC | PRN

## 2017-04-07 MED ORDER — FENTANYL CITRATE (PF) 100 MCG/2ML IJ SOLN
INTRAMUSCULAR | Status: DC | PRN
  Administered 2017-04-07 (×4): 25 ug via INTRAVENOUS

## 2017-04-07 MED ORDER — ONDANSETRON HCL 4 MG/2ML IJ SOLN
INTRAMUSCULAR | Status: DC | PRN
  Administered 2017-04-07: 4 mg via INTRAVENOUS

## 2017-04-07 MED ORDER — LIDOCAINE 2% (20 MG/ML) 5 ML SYRINGE
INTRAMUSCULAR | Status: AC
  Filled 2017-04-07: qty 5

## 2017-04-07 MED ORDER — PROPOFOL 10 MG/ML IV BOLUS
INTRAVENOUS | Status: AC
  Filled 2017-04-07: qty 20

## 2017-04-07 MED ORDER — LIDOCAINE HCL (CARDIAC) 20 MG/ML IV SOLN
INTRAVENOUS | Status: DC | PRN
  Administered 2017-04-07: 60 mg via INTRATRACHEAL

## 2017-04-07 MED ORDER — PROPOFOL 500 MG/50ML IV EMUL
INTRAVENOUS | Status: DC | PRN
  Administered 2017-04-07: 25 ug/kg/min via INTRAVENOUS

## 2017-04-07 MED ORDER — LIDOCAINE HCL (PF) 1 % IJ SOLN
INTRAMUSCULAR | Status: DC | PRN
  Administered 2017-04-07: 20 mL

## 2017-04-07 MED ORDER — SODIUM CHLORIDE 0.9 % IV SOLN
INTRAVENOUS | Status: DC | PRN
  Administered 2017-04-07: 08:00:00

## 2017-04-07 MED ORDER — CHLORHEXIDINE GLUCONATE 4 % EX LIQD: 60.0000 mL | Freq: Once | CUTANEOUS | Status: DC

## 2017-04-07 MED ORDER — FENTANYL CITRATE (PF) 100 MCG/2ML IJ SOLN: 25.0000 ug | INTRAMUSCULAR | Status: DC | PRN

## 2017-04-07 MED ORDER — FENTANYL CITRATE (PF) 250 MCG/5ML IJ SOLN
INTRAMUSCULAR | Status: AC
  Filled 2017-04-07: qty 5

## 2017-04-07 MED ORDER — OXYCODONE HCL 5 MG/5ML PO SOLN
5.0000 mg | Freq: Once | ORAL | Status: DC | PRN
Start: 2017-04-07 — End: 2017-04-07

## 2017-04-07 MED ORDER — ONDANSETRON HCL 4 MG/2ML IJ SOLN: 4.0000 mg | Freq: Once | INTRAMUSCULAR | Status: DC | PRN

## 2017-04-07 MED ORDER — GLYCOPYRROLATE 0.2 MG/ML IJ SOLN
INTRAMUSCULAR | Status: DC | PRN
  Administered 2017-04-07: 0.1 mg via INTRAVENOUS
  Administered 2017-04-07: .1 mg via INTRAVENOUS

## 2017-04-07 MED ORDER — PROPOFOL 10 MG/ML IV BOLUS
INTRAVENOUS | Status: DC | PRN
  Administered 2017-04-07: 20 mg via INTRAVENOUS
  Administered 2017-04-07: 50 mg via INTRAVENOUS
  Administered 2017-04-07: 20 mg via INTRAVENOUS

## 2017-04-07 MED ORDER — 0.9 % SODIUM CHLORIDE (POUR BTL) OPTIME
TOPICAL | Status: DC | PRN
  Administered 2017-04-07: 1000 mL

## 2017-04-07 MED ORDER — SODIUM CHLORIDE 0.9 % IV SOLN
INTRAVENOUS | Status: DC
  Administered 2017-04-07: 07:00:00 via INTRAVENOUS

## 2017-04-07 MED ORDER — MEPERIDINE HCL 25 MG/ML IJ SOLN: 6.2500 mg | INTRAMUSCULAR | Status: DC | PRN

## 2017-04-07 MED ORDER — HEPARIN SODIUM (PORCINE) 1000 UNIT/ML IJ SOLN
INTRAMUSCULAR | Status: AC
  Filled 2017-04-07: qty 1

## 2017-04-07 SURGICAL SUPPLY — 34 items
ARMBAND PINK RESTRICT EXTREMIT (MISCELLANEOUS) ×4 IMPLANT
CANISTER SUCT 3000ML PPV (MISCELLANEOUS) ×2 IMPLANT
CANNULA VESSEL 3MM 2 BLNT TIP (CANNULA) ×2 IMPLANT
CATH SILICONE 5CC 18FR (INSTRUMENTS) ×2 IMPLANT
CLIP VESOCCLUDE MED 6/CT (CLIP) ×2 IMPLANT
CLIP VESOCCLUDE SM WIDE 6/CT (CLIP) ×2 IMPLANT
COVER PROBE W GEL 5X96 (DRAPES) IMPLANT
DECANTER SPIKE VIAL GLASS SM (MISCELLANEOUS) ×2 IMPLANT
DERMABOND ADVANCED (GAUZE/BANDAGES/DRESSINGS) ×1
DERMABOND ADVANCED .7 DNX12 (GAUZE/BANDAGES/DRESSINGS) ×1 IMPLANT
DRAIN PENROSE 1/4X12 LTX STRL (WOUND CARE) ×2 IMPLANT
ELECT REM PT RETURN 9FT ADLT (ELECTROSURGICAL) ×2
ELECTRODE REM PT RTRN 9FT ADLT (ELECTROSURGICAL) ×1 IMPLANT
GLOVE BIOGEL PI IND STRL 6.5 (GLOVE) ×6 IMPLANT
GLOVE BIOGEL PI INDICATOR 6.5 (GLOVE) ×6
GLOVE SURG SS PI 6.5 STRL IVOR (GLOVE) ×6 IMPLANT
GLOVE SURG SS PI 7.5 STRL IVOR (GLOVE) ×2 IMPLANT
GOWN STRL REUS W/ TWL LRG LVL3 (GOWN DISPOSABLE) ×5 IMPLANT
GOWN STRL REUS W/TWL LRG LVL3 (GOWN DISPOSABLE) ×5
KIT BASIN OR (CUSTOM PROCEDURE TRAY) ×2 IMPLANT
KIT ROOM TURNOVER OR (KITS) ×2 IMPLANT
LOOP VESSEL MINI RED (MISCELLANEOUS) IMPLANT
NS IRRIG 1000ML POUR BTL (IV SOLUTION) ×2 IMPLANT
PACK CV ACCESS (CUSTOM PROCEDURE TRAY) ×2 IMPLANT
PAD ARMBOARD 7.5X6 YLW CONV (MISCELLANEOUS) ×4 IMPLANT
SPONGE SURGIFOAM ABS GEL 100 (HEMOSTASIS) IMPLANT
SUT PROLENE 6 0 BV (SUTURE) ×2 IMPLANT
SUT PROLENE 7 0 BV 1 (SUTURE) ×2 IMPLANT
SUT VIC AB 3-0 SH 27 (SUTURE) ×1
SUT VIC AB 3-0 SH 27X BRD (SUTURE) ×1 IMPLANT
SUT VICRYL 4-0 PS2 18IN ABS (SUTURE) ×2 IMPLANT
TOWEL GREEN STERILE (TOWEL DISPOSABLE) ×2 IMPLANT
UNDERPAD 30X30 (UNDERPADS AND DIAPERS) ×2 IMPLANT
WATER STERILE IRR 1000ML POUR (IV SOLUTION) ×2 IMPLANT

## 2017-04-07 NOTE — Anesthesia Postprocedure Evaluation (Signed)
Anesthesia Post Note  Patient: Yvonne Powell  Procedure(s) Performed: ARTERIOVENOUS (AV) FISTULA CREATION (Right Arm Upper)     Patient location during evaluation: PACU Anesthesia Type: MAC Level of consciousness: awake and alert and oriented Pain management: pain level controlled Vital Signs Assessment: post-procedure vital signs reviewed and stable Respiratory status: spontaneous breathing, nonlabored ventilation and respiratory function stable Cardiovascular status: stable and blood pressure returned to baseline Postop Assessment: no apparent nausea or vomiting Anesthetic complications: no    Last Vitals:  Vitals:   04/07/17 0915 04/07/17 0924  BP: (!) 118/91 136/72  Pulse: 77 79  Resp: 11 15  Temp:  (!) 36.1 C  SpO2: 99% 97%    Last Pain:  Vitals:   04/07/17 0625  TempSrc: Tympanic                 Fatih Stalvey A.

## 2017-04-07 NOTE — Interval H&P Note (Signed)
History and Physical Interval Note:  04/07/2017 7:24 AM  Yvonne Powell  has presented today for surgery, with the diagnosis of Chronic kidney disease  The various methods of treatment have been discussed with the patient and family. After consideration of risks, benefits and other options for treatment, the patient has consented to  Procedure(s): ARTERIOVENOUS (AV) FISTULA CREATION (Right) as a surgical intervention .  The patient's history has been reviewed, patient examined, no change in status, stable for surgery.  I have reviewed the patient's chart and labs.  Questions were answered to the patient's satisfaction.     [name]

## 2017-04-07 NOTE — Discharge Instructions (Signed)
° °  [facility]  Discharge Instructions  AV Fistula or Graft Surgery for Dialysis Access  Please refer to the following instructions for your post-procedure care. Your surgeon or physician assistant will discuss any changes with you.  Activity  You may drive the day following your surgery, if you are comfortable and no longer taking prescription pain medication. Resume full activity as the soreness in your incision resolves.  Bathing/Showering  You may shower after you go home. Keep your incision dry for 48 hours. Do not soak in a bathtub, hot tub, or swim until the incision heals completely. You may not shower if you have a hemodialysis catheter.  Incision Care  Clean your incision with mild soap and water after 48 hours. Pat the area dry with a clean towel. You do not need a bandage unless otherwise instructed. Do not apply any ointments or creams to your incision. You may have skin glue on your incision. Do not peel it off. It will come off on its own in about one week. Your arm may swell a bit after surgery. To reduce swelling use pillows to elevate your arm so it is above your heart. Your doctor will tell you if you need to lightly wrap your arm with an ACE bandage.  Diet  Resume your normal diet. There are not special food restrictions following this procedure. In order to heal from your surgery, it is CRITICAL to get adequate nutrition. Your body requires vitamins, minerals, and protein. Vegetables are the best source of vitamins and minerals. Vegetables also provide the perfect balance of protein. Processed food has little nutritional value, so try to avoid this.  Medications  Resume taking all of your medications. If your incision is causing pain, you may take over-the counter pain relievers such as acetaminophen (Tylenol). If you were prescribed a stronger pain medication, please be aware these medications can cause nausea and constipation. Prevent  nausea by taking the medication with a snack or meal. Avoid constipation by drinking plenty of fluids and eating foods with high amount of fiber, such as fruits, vegetables, and grains. Do not take Tylenol if you are taking prescription pain medications.  Follow up Your surgeon may want to see you in the office following your access surgery. If so, this will be arranged at the time of your surgery.  Please call us immediately for any of the following conditions:  Increased pain, redness, drainage (pus) from your incision site Fever of 101 degrees or higher Severe or worsening pain at your incision site Hand pain or numbness.  Reduce your risk of vascular disease:  Stop smoking. If you would like help, call [facility] at 1-800-QUIT-NOW 219-457-2186) or [place] at [place] your cholesterol Maintain a desired weight Control your diabetes Keep your blood pressure down  Dialysis  It will take several weeks to several months for your new dialysis access to be ready for use. Your surgeon will determine when it is OK to use it. Your nephrologist will continue to direct your dialysis. You can continue to use your Permcath until your new access is ready for use.   04/07/2017 Yvonne Powell [id number] 08/27/1928  Surgeon(s): Elam Dutch, MD  Procedure(s): Right Brachial Cephalic AV Fistula   x Do not stick fistula for 12 weeks    If you have any questions, please call the office at [phone].

## 2017-04-07 NOTE — Anesthesia Procedure Notes (Signed)
Procedure Name: MAC Date/Time: 04/07/2017 7:38 AM Performed by: Candis Shine, CRNA Pre-anesthesia Checklist: Patient identified, Emergency Drugs available, Suction available, Patient being monitored and Timeout performed Patient Re-evaluated:Patient Re-evaluated prior to induction Oxygen Delivery Method: Simple face mask Dental Injury: Teeth and Oropharynx as per pre-operative assessment

## 2017-04-07 NOTE — Telephone Encounter (Signed)
-----   Message from Mena Goes, RN sent at 04/07/2017  9:32 AM EST ----- Regarding: 4-6 weeks w/ duplex   ----- Message ----- From: Gabriel Earing, PA-C Sent: 04/07/2017   8:44 AM To: Vvs Charge Pool  S/p right BC AVF.  F/u with Dr. Oneida Alar with duplex in 4-6 weeks.  Thanks

## 2017-04-07 NOTE — Op Note (Addendum)
Procedure: Right Brachial Cephalic AV fistula  Preop: CKD 4  Postop: same  Anesthesia: Local with sedation  Assistant: Leontine Locket PA-C  Findings: 3 mm cephalic vein  Procedure: After obtaining informed consent, the patient was taken to the operating room.  After adequate sedation, the right upper extremity was prepped and draped in usual sterile fashion.  Local anesthesia was infiltrated near the antecubital crease.  A transverse incision was then made near the antecubital crease the right arm. The incision was carried into the subcutaneous tissues down to level of the cephalic vein. The cephalic vein was approximately 3 mm in diameter. It was of good quality. This was dissected free circumferentially and small side branches ligated and divided between silk ties or clips. Next the brachial artery was dissected free in the medial portion of the incision. The artery was  3 mm in diameter. The vessel loops were placed proximal and distal to the planned site of arteriotomy. The patient was given 3000 units of intravenous heparin. After appropriate circulation time, the vessel loops were used to control the artery. A longitudinal opening was made in the brachial artery.  The vein was ligated distally with a 3-0 silk tie. The vein was controlled proximally with a fine bulldog clamp. The vein was then swung over to the artery and sewn end of vein to side of artery using a running 7-0 Prolene suture. Just prior to completion of the anastomosis, everything was fore bled back bled and thoroughly flushed. The anastomosis was secured, vessel loops released, and there was a palpable thrill in the fistula immediately. After hemostasis was obtained, the subcutaneous tissues were reapproximated using a running 3-0 Vicryl suture. The skin was then closed with a 4 0 Vicryl subcuticular stitch. Dermabond was applied to the skin incision.  The patient had a palpable radial pulse at the end of the case.  [name], MD [facility] Office: [phone] Pager: [phone]

## 2017-04-07 NOTE — Telephone Encounter (Signed)
Sched appt 05/21/16; lab at 11:00 and MD at 11:30. Spoke to pt.

## 2017-04-07 NOTE — Transfer of Care (Signed)
Immediate Anesthesia Transfer of Care Note  Patient: Veronnica Hennings  Procedure(s) Performed: ARTERIOVENOUS (AV) FISTULA CREATION (Right Arm Upper)  Patient Location: PACU  Anesthesia Type:MAC  Level of Consciousness: awake, alert , oriented and patient cooperative  Airway & Oxygen Therapy: Patient Spontanous Breathing  Post-op Assessment: Report given to RN  Post vital signs: Reviewed and stable  Last Vitals:  Vitals:   04/07/17 0625 04/07/17 0853  BP:    Pulse:  81  Resp:  14  Temp: (!) 36.1 C (!) 36.3 C  SpO2:  99%    Last Pain:  Vitals:   04/07/17 0625  TempSrc: Tympanic      Patients Stated Pain Goal: 7 (37/54/23 7023)  Complications: No apparent anesthesia complications

## 2017-04-08 ENCOUNTER — Other Ambulatory Visit: Payer: Self-pay

## 2017-04-08 ENCOUNTER — Encounter (HOSPITAL_COMMUNITY): Payer: Self-pay | Admitting: Vascular Surgery

## 2017-04-08 DIAGNOSIS — Z48812 Encounter for surgical aftercare following surgery on the circulatory system: Secondary | ICD-10-CM

## 2017-04-08 DIAGNOSIS — N186 End stage renal disease: Secondary | ICD-10-CM

## 2017-04-12 ENCOUNTER — Emergency Department (HOSPITAL_COMMUNITY)
Admission: EM | Admit: 2017-04-12 | Discharge: 2017-04-12 | Disposition: A | Discharge status: Home / Self Care | Payer: Medicare Other | Attending: Emergency Medicine | Admitting: Emergency Medicine

## 2017-04-12 ENCOUNTER — Other Ambulatory Visit: Payer: Self-pay

## 2017-04-12 ENCOUNTER — Encounter (HOSPITAL_COMMUNITY): Payer: Self-pay | Admitting: Emergency Medicine

## 2017-04-12 ENCOUNTER — Emergency Department (HOSPITAL_COMMUNITY): Payer: Medicare Other

## 2017-04-12 DIAGNOSIS — J029 Acute pharyngitis, unspecified: Secondary | ICD-10-CM | POA: Insufficient documentation

## 2017-04-12 DIAGNOSIS — R070 Pain in throat: Secondary | ICD-10-CM | POA: Diagnosis present

## 2017-04-12 DIAGNOSIS — K122 Cellulitis and abscess of mouth: Secondary | ICD-10-CM | POA: Insufficient documentation

## 2017-04-12 DIAGNOSIS — I10 Essential (primary) hypertension: Secondary | ICD-10-CM | POA: Insufficient documentation

## 2017-04-12 DIAGNOSIS — R05 Cough: Secondary | ICD-10-CM | POA: Diagnosis not present

## 2017-04-12 DIAGNOSIS — Z79899 Other long term (current) drug therapy: Secondary | ICD-10-CM | POA: Diagnosis not present

## 2017-04-12 MED ORDER — DIPHENHYDRAMINE HCL 25 MG PO TABS: 25.0000 mg | ORAL_TABLET | Freq: Four times a day (QID) | ORAL | 0 refills | Status: DC | PRN

## 2017-04-12 MED ORDER — GI COCKTAIL ~~LOC~~
30.0000 mL | Freq: Once | ORAL | Status: AC
  Administered 2017-04-12: 30 mL via ORAL
  Filled 2017-04-12: qty 30

## 2017-04-12 MED ORDER — DIPHENHYDRAMINE HCL 25 MG PO CAPS
25.0000 mg | ORAL_CAPSULE | Freq: Once | ORAL | Status: AC
  Administered 2017-04-12: 25 mg via ORAL
  Filled 2017-04-12: qty 1

## 2017-04-12 MED ORDER — DEXAMETHASONE SODIUM PHOSPHATE 10 MG/ML IJ SOLN
10.0000 mg | Freq: Once | INTRAMUSCULAR | Status: AC
  Administered 2017-04-12: 10 mg via INTRAMUSCULAR
  Filled 2017-04-12: qty 1

## 2017-04-12 NOTE — Discharge Instructions (Signed)
You were seen today for sore throat.  This is likely related to your recent intubation.  You have evidence of uvula swelling which is likely again related to intubation and inflammation.  Take Benadryl as needed at home.  You are given his dose of steroids.  You can also use Chloraseptic for any pain.  If you develop difficulty swallowing, worsening pain or symptoms, you should be reevaluated.

## 2017-04-12 NOTE — ED Notes (Signed)
Patient transported to X-ray 

## 2017-04-12 NOTE — ED Triage Notes (Signed)
Pt states she had a surgery on her right arm 2 days ago and her pcp told her that she may develop some sore throat, pt states she is having this today with difficulty swallowing, pt able to talk on complete sentences no swollen noticed.

## 2017-04-12 NOTE — ED Provider Notes (Signed)
[facility] EMERGENCY DEPARTMENT Provider Note   CSN: [id number] Arrival date & time: 04/12/17  0053     History   Chief Complaint Chief Complaint  Patient presents with  . Sore Throat    HPI Yvonne Powell is a 83 y.o. female.  HPI  This is an 83 year old female with a history of anemia, DVT, hypertension, renal disease who presents with sore throat.  Patient had a fistula placed on Tuesday.  She was intubated for the procedure.  She reports since that time she has had sore throat.  She has had no difficulty swallowing or eating.  However, today she had worsening of swelling sensation and felt like "my throat was going to close up."  Denies any recent fevers or infectious symptoms.  She does report nonproductive cough.  She has not taken anything for her symptoms. Past Medical History:  Diagnosis Date  . Anemia    due to kidney  . DVT (deep venous thrombosis) ([facility])    hx  . Hypertension   . Renal disorder   . Thyroid disease    graves dx    There are no active problems to display for this patient.   Past Surgical History:  Procedure Laterality Date  . AV FISTULA PLACEMENT Right 04/07/2017   Procedure: ARTERIOVENOUS (AV) FISTULA CREATION;  Surgeon: Elam Dutch, MD;  Location: [facility];  Service: Vascular;  Laterality: Right;  . COLONOSCOPY    . DILATION AND CURETTAGE OF UTERUS    . EYE SURGERY     cactaracts  . LIPOMA EXCISION    . LUMBAR EPIDURAL INJECTION  2018    OB History    No data available       Home Medications    Prior to Admission medications   Medication Sig Start Date End Date Taking? Authorizing Provider  calcitRIOL (ROCALTROL) 0.25 MCG capsule Take 0.25 mcg by mouth every other day.    [provider]  Cholecalciferol (VITAMIN D) 2000 units tablet Take 2,000 Units by mouth daily.    [provider]  diltiazem (TIAZAC) 300 MG 24 hr capsule Take 300 mg by mouth daily.    [provider]    diphenhydrAMINE (BENADRYL) 25 MG tablet Take 1 tablet (25 mg total) by mouth every 6 (six) hours as needed. 04/12/17   Horton, Barbette Hair, MD  docusate sodium (COLACE) 100 MG capsule Take 100 mg by mouth daily as needed for moderate constipation.     [provider]  doxazosin (CARDURA) 8 MG tablet Take 8 mg by mouth daily.    [provider]  Iron-FA-DSS-B Cmplx-Vit C (NEPHRON FA PO) Take 1 tablet by mouth daily.    [provider]  levothyroxine (SYNTHROID, LEVOTHROID) 88 MCG tablet Take 88 mcg by mouth daily before breakfast.    [provider]  oxyCODONE-acetaminophen (ROXICET) 5-325 MG tablet Take 1 tablet by mouth every 8 (eight) hours as needed for severe pain. 04/07/17   Gabriel Earing, PA-C    Family History No family history on file.  Social History Social History   Tobacco Use  . Smoking status: Never Smoker  . Smokeless tobacco: Never Used  Substance Use Topics  . Alcohol use: No  . Drug use: No     Allergies   Doxycycline; Clindamycin/lincomycin; Compazine [prochlorperazine edisylate]; Iodine; Sulfa antibiotics; and Penicillins   Review of Systems Review of Systems  Constitutional: Negative for fever.  HENT: Positive for sore throat. Negative for trouble swallowing.  Respiratory: Positive for cough. Negative for shortness of breath.   Cardiovascular: Negative for chest pain.  Gastrointestinal: Negative for abdominal pain, nausea and vomiting.  All other systems reviewed and are negative.    Physical Exam Updated Vital Signs BP (!) 156/63   Pulse 62   Temp 98.6 F (37 C) (Oral)   Resp 15   SpO2 100%   Physical Exam  Constitutional: She is oriented to person, place, and time. She appears well-developed and well-nourished.  HENT:  Head: Normocephalic and atraumatic.  Mildly erythematous, no oropharyngeal swelling noted, isolated uvula edema without airway compromise  Neck: Neck supple.  Cardiovascular: Normal rate,  regular rhythm and normal heart sounds.  Pulmonary/Chest: Effort normal. No respiratory distress. She has no wheezes.  Neurological: She is alert and oriented to person, place, and time.  Skin: Skin is warm and dry.  Psychiatric: She has a normal mood and affect.  Nursing note and vitals reviewed.    ED Treatments / Results  Labs (all labs ordered are listed, but only abnormal results are displayed) Labs Reviewed - No data to display  EKG  EKG Interpretation None       Radiology Dg Chest 2 View  Result Date: 04/12/2017 CLINICAL DATA:  83 year old female with cough. EXAM: CHEST  2 VIEW COMPARISON:  Chest radiograph dated 11/28/2015 FINDINGS: There is emphysematous changes of the lungs with hyperinflation. There is no focal consolidation, pleural effusion, or pneumothorax. The cardiac silhouette is within normal limits. There is atherosclerotic calcification of the aortic arch. The aorta is slightly tortuous. Osteopenia with degenerative changes of the spine. No acute osseous pathology. IMPRESSION: 1. No acute cardiopulmonary process. 2. Emphysema. Electronically Signed   By: Anner Crete M.D.   On: 04/12/2017 02:09    Procedures Procedures (including critical care time)  Medications Ordered in ED Medications  dexamethasone (DECADRON) injection 10 mg (not administered)  gi cocktail (Maalox,Lidocaine,Donnatal) (30 mLs Oral Given 04/12/17 0207)  diphenhydrAMINE (BENADRYL) capsule 25 mg (25 mg Oral Given 04/12/17 0207)     Initial Impression / Assessment and Plan / ED Course  I have reviewed the triage vital signs and the nursing notes.  Pertinent labs & imaging results that were available during my care of the patient were reviewed by me and considered in my medical decision making (see chart for details).     Patient presents sore throat and increasing feeling of swelling today.  She does have what appears to be mild uvulitis on exam.  No infectious symptoms.  Likely  related to recent intubation.  Patient was given a GI cocktail, Decadron, and Benadryl.  She feels much better after the GI cocktail.  Chest x-ray shows no evidence of pneumonia.  Feel cough is likely related to atelectasis postprocedure.  Recommend Benadryl as needed at home.  She can also use Chloraseptic.  After history, exam, and medical workup I feel the patient has been appropriately medically screened and is safe for discharge home. Pertinent diagnoses were discussed with the patient. Patient was given return precautions.   Final Clinical Impressions(s) / ED Diagnoses   Final diagnoses:  Uvulitis  Sore throat    ED Discharge Orders        Ordered    diphenhydrAMINE (BENADRYL) 25 MG tablet  Every 6 hours PRN     04/12/17 0239       Merryl Hacker, MD 04/12/17 [phone]

## 2017-04-14 DIAGNOSIS — Z872 Personal history of diseases of the skin and subcutaneous tissue: Secondary | ICD-10-CM | POA: Diagnosis not present

## 2017-04-14 DIAGNOSIS — L821 Other seborrheic keratosis: Secondary | ICD-10-CM | POA: Diagnosis not present

## 2017-04-14 DIAGNOSIS — L570 Actinic keratosis: Secondary | ICD-10-CM | POA: Diagnosis not present

## 2017-05-14 DIAGNOSIS — N185 Chronic kidney disease, stage 5: Secondary | ICD-10-CM | POA: Diagnosis not present

## 2017-05-14 DIAGNOSIS — I120 Hypertensive chronic kidney disease with stage 5 chronic kidney disease or end stage renal disease: Secondary | ICD-10-CM | POA: Diagnosis not present

## 2017-05-14 DIAGNOSIS — N2581 Secondary hyperparathyroidism of renal origin: Secondary | ICD-10-CM | POA: Diagnosis not present

## 2017-05-14 DIAGNOSIS — D631 Anemia in chronic kidney disease: Secondary | ICD-10-CM | POA: Diagnosis not present

## 2017-05-14 DIAGNOSIS — N281 Cyst of kidney, acquired: Secondary | ICD-10-CM | POA: Diagnosis not present

## 2017-05-21 ENCOUNTER — Encounter: Payer: Self-pay | Admitting: Vascular Surgery

## 2017-05-21 ENCOUNTER — Ambulatory Visit (HOSPITAL_COMMUNITY)
Admission: RE | Admit: 2017-05-21 | Discharge: 2017-05-21 | Disposition: A | Discharge status: Home / Self Care | Payer: Medicare Other | Source: Ambulatory Visit | Attending: Vascular Surgery | Admitting: Vascular Surgery

## 2017-05-21 ENCOUNTER — Ambulatory Visit (INDEPENDENT_AMBULATORY_CARE_PROVIDER_SITE_OTHER): Payer: Self-pay | Admitting: Vascular Surgery

## 2017-05-21 VITALS — BP 159/68 | HR 72 | Resp 18 | Ht 64.0 in | Wt 131.0 lb

## 2017-05-21 DIAGNOSIS — N186 End stage renal disease: Secondary | ICD-10-CM

## 2017-05-21 DIAGNOSIS — Z48812 Encounter for surgical aftercare following surgery on the circulatory system: Secondary | ICD-10-CM | POA: Diagnosis not present

## 2017-05-21 DIAGNOSIS — N184 Chronic kidney disease, stage 4 (severe): Secondary | ICD-10-CM

## 2017-05-21 NOTE — Progress Notes (Signed)
Patient is an 84 year old female who returns for follow-up today after placement of a right brachiocephalic AV fistula 79/1/50.  She is currently not on hemodialysis.  She reports no numbness or tingling in her hand.  She currently reports no drainage from the incision.  Physical exam:  Vitals:   05/21/17 1144  BP: (!) 159/68  Pulse: 72  Resp: 18  SpO2: 100%  Weight: 131 lb (59.4 kg)  Height: 5\' 4"  (4.136 m)    Extremities: Palpable thrill right arm AV fistula well-healed antecubital incision fistula is quite prominent and visible throughout the upper arm  Data: Patient had a duplex ultrasound of her AV fistula today.  This showed the diameter to be 6-8 mm.  It is less than 6 mm from the skin throughout its course.  Assessment: Maturing AV fistula right arm.  No evidence of steal.  Plan: The fistula should be ready for cannulation in March 2018 if necessary.  She will follow-up on an as-needed basis.  [name], MD [facility] Office: [phone] Pager: [phone]

## 2017-05-29 DIAGNOSIS — D638 Anemia in other chronic diseases classified elsewhere: Secondary | ICD-10-CM | POA: Diagnosis not present

## 2017-05-29 DIAGNOSIS — Z1389 Encounter for screening for other disorder: Secondary | ICD-10-CM | POA: Diagnosis not present

## 2017-05-29 DIAGNOSIS — E785 Hyperlipidemia, unspecified: Secondary | ICD-10-CM | POA: Diagnosis not present

## 2017-05-29 DIAGNOSIS — Z0000 Encounter for general adult medical examination without abnormal findings: Secondary | ICD-10-CM | POA: Diagnosis not present

## 2017-05-29 DIAGNOSIS — M1612 Unilateral primary osteoarthritis, left hip: Secondary | ICD-10-CM | POA: Diagnosis not present

## 2017-05-29 DIAGNOSIS — I10 Essential (primary) hypertension: Secondary | ICD-10-CM | POA: Diagnosis not present

## 2017-05-29 DIAGNOSIS — N184 Chronic kidney disease, stage 4 (severe): Secondary | ICD-10-CM | POA: Diagnosis not present

## 2017-05-29 DIAGNOSIS — M810 Age-related osteoporosis without current pathological fracture: Secondary | ICD-10-CM | POA: Diagnosis not present

## 2017-05-29 DIAGNOSIS — E559 Vitamin D deficiency, unspecified: Secondary | ICD-10-CM | POA: Diagnosis not present

## 2017-07-18 ENCOUNTER — Other Ambulatory Visit: Payer: Self-pay

## 2017-07-18 ENCOUNTER — Encounter (HOSPITAL_COMMUNITY): Payer: Self-pay | Admitting: Emergency Medicine

## 2017-07-18 DIAGNOSIS — B029 Zoster without complications: Secondary | ICD-10-CM | POA: Diagnosis not present

## 2017-07-18 DIAGNOSIS — J029 Acute pharyngitis, unspecified: Secondary | ICD-10-CM | POA: Diagnosis not present

## 2017-07-18 DIAGNOSIS — Z79899 Other long term (current) drug therapy: Secondary | ICD-10-CM | POA: Diagnosis not present

## 2017-07-18 DIAGNOSIS — R21 Rash and other nonspecific skin eruption: Secondary | ICD-10-CM | POA: Diagnosis not present

## 2017-07-18 DIAGNOSIS — I10 Essential (primary) hypertension: Secondary | ICD-10-CM | POA: Diagnosis not present

## 2017-07-18 NOTE — ED Triage Notes (Signed)
Reports having a sore throat last weekend that was better but noticed a cluster of blisters to right leg on Friday.  Rash noted to sacrum  And radiating down back of right leg.  Some blisters are open and scabbed over.  Took 2 benadryl at 2300.  Reports feeling jittery since taking and feels anxious.

## 2017-07-19 ENCOUNTER — Emergency Department (HOSPITAL_COMMUNITY)
Admission: EM | Admit: 2017-07-19 | Discharge: 2017-07-19 | Disposition: A | Discharge status: Home / Self Care | Payer: Medicare Other | Attending: Emergency Medicine | Admitting: Emergency Medicine

## 2017-07-19 DIAGNOSIS — B029 Zoster without complications: Secondary | ICD-10-CM

## 2017-07-19 LAB — RAPID STREP SCREEN (MED CTR MEBANE ONLY): Streptococcus, Group A Screen (Direct): NEGATIVE

## 2017-07-19 LAB — CBC WITH DIFFERENTIAL/PLATELET
Basophils Absolute: 0 10*3/uL (ref 0.0–0.1)
Basophils Relative: 1 %
Eosinophils Absolute: 0.2 10*3/uL (ref 0.0–0.7)
Eosinophils Relative: 2 %
HEMATOCRIT: 28.6 % — AB (ref 36.0–46.0)
Hemoglobin: 9.7 g/dL — ABNORMAL LOW (ref 12.0–15.0)
LYMPHS ABS: 1.7 10*3/uL (ref 0.7–4.0)
Lymphocytes Relative: 27 %
MCH: 31.8 pg (ref 26.0–34.0)
MCHC: 33.9 g/dL (ref 30.0–36.0)
MCV: 93.8 fL (ref 78.0–100.0)
MONOS PCT: 11 %
Monocytes Absolute: 0.7 10*3/uL (ref 0.1–1.0)
NEUTROS ABS: 3.7 10*3/uL (ref 1.7–7.7)
NEUTROS PCT: 59 %
Platelets: 155 10*3/uL (ref 150–400)
RBC: 3.05 MIL/uL — ABNORMAL LOW (ref 3.87–5.11)
RDW: 13.2 % (ref 11.5–15.5)
WBC: 6.3 10*3/uL (ref 4.0–10.5)

## 2017-07-19 LAB — BASIC METABOLIC PANEL
Anion gap: 9 (ref 5–15)
BUN: 42 mg/dL — AB (ref 6–20)
CHLORIDE: 108 mmol/L (ref 101–111)
CO2: 20 mmol/L — AB (ref 22–32)
Calcium: 9.3 mg/dL (ref 8.9–10.3)
Creatinine, Ser: 3.22 mg/dL — ABNORMAL HIGH (ref 0.44–1.00)
GFR calc non Af Amer: 12 mL/min — ABNORMAL LOW (ref >60)
GFR, EST AFRICAN AMERICAN: 14 mL/min — AB (ref >60)
Glucose, Bld: 111 mg/dL — ABNORMAL HIGH (ref 65–99)
Potassium: 5.2 mmol/L — ABNORMAL HIGH (ref 3.5–5.1)
Sodium: 137 mmol/L (ref 135–145)

## 2017-07-19 MED ORDER — HYDROCODONE-ACETAMINOPHEN 5-325 MG PO TABS
1.0000 | ORAL_TABLET | Freq: Once | ORAL | Status: AC
  Administered 2017-07-19: 1 via ORAL
  Filled 2017-07-19: qty 1

## 2017-07-19 MED ORDER — VALACYCLOVIR HCL 1 G PO TABS: 1000.0000 mg | ORAL_TABLET | Freq: Every day | ORAL | 0 refills | Status: AC

## 2017-07-19 MED ORDER — VALACYCLOVIR HCL 500 MG PO TABS
1000.0000 mg | ORAL_TABLET | Freq: Once | ORAL | Status: AC
  Administered 2017-07-19: 1000 mg via ORAL
  Filled 2017-07-19 (×2): qty 2

## 2017-07-19 MED ORDER — HYDROCODONE-ACETAMINOPHEN 5-325 MG PO TABS: 1.0000 | ORAL_TABLET | ORAL | 0 refills | Status: DC | PRN

## 2017-07-19 NOTE — Discharge Instructions (Signed)
Take the medications as prescribed.  Follow-up with your nephrologist.  Return to the ED if you develop new or worsening symptoms.

## 2017-07-19 NOTE — ED Provider Notes (Signed)
[facility] EMERGENCY DEPARTMENT Provider Note   CSN: [id number] Arrival date & time: 07/18/17  2334     History   Chief Complaint Chief Complaint  Patient presents with  . Allergic Reaction    HPI Yvonne Powell is a 84 y.o. female.  Patient presents with painful itchy red rash noted to her low back that radiates down her right leg and buttock.  Denies any falls or trauma.  She started to feel pain around her tailbone 1 day ago and then noticed a rash with pain last night starting on her buttock and rating down her right leg.  She took 2 Benadryl at home at 11 PM.  She denies any difficulty breathing or swallowing.  No new exposures.  No chest pain or shortness of breath.  She had a sore throat last week which is since improved but still has a dry sensation in her throat.  No cough or fever.  History of CKD not yet on dialysis.   The history is provided by the patient and a relative.  Allergic Reaction  Presenting symptoms: rash     Past Medical History:  Diagnosis Date  . Anemia    due to kidney  . DVT (deep venous thrombosis) (HCC)    hx  . Hypertension   . Renal disorder   . Thyroid disease    graves dx    There are no active problems to display for this patient.   Past Surgical History:  Procedure Laterality Date  . AV FISTULA PLACEMENT Right 04/07/2017   Procedure: ARTERIOVENOUS (AV) FISTULA CREATION;  Surgeon: Elam Dutch, MD;  Location: [facility];  Service: Vascular;  Laterality: Right;  . COLONOSCOPY    . DILATION AND CURETTAGE OF UTERUS    . EYE SURGERY     cactaracts  . LIPOMA EXCISION    . LUMBAR EPIDURAL INJECTION  2018    OB History    No data available       Home Medications    Prior to Admission medications   Medication Sig Start Date End Date Taking? Authorizing Provider  calcitRIOL (ROCALTROL) 0.25 MCG capsule Take 0.25 mcg by mouth every other day.    [provider]  Cholecalciferol (VITAMIN D) 2000 units  tablet Take 2,000 Units by mouth daily.    [provider]  diltiazem (TIAZAC) 300 MG 24 hr capsule Take 300 mg by mouth daily.    [provider]  diphenhydrAMINE (BENADRYL) 25 MG tablet Take 1 tablet (25 mg total) by mouth every 6 (six) hours as needed. 04/12/17   Horton, Barbette Hair, MD  docusate sodium (COLACE) 100 MG capsule Take 100 mg by mouth daily as needed for moderate constipation.     [provider]  doxazosin (CARDURA) 8 MG tablet Take 8 mg by mouth daily.    [provider]  Iron-FA-DSS-B Cmplx-Vit C (NEPHRON FA PO) Take 1 tablet by mouth daily.    [provider]  levothyroxine (SYNTHROID, LEVOTHROID) 88 MCG tablet Take 88 mcg by mouth daily before breakfast.    [provider]  oxyCODONE-acetaminophen (ROXICET) 5-325 MG tablet Take 1 tablet by mouth every 8 (eight) hours as needed for severe pain. 04/07/17   Gabriel Earing, PA-C    Family History No family history on file.  Social History Social History   Tobacco Use  . Smoking status: Never Smoker  . Smokeless tobacco: Never Used  Substance Use Topics  . Alcohol use: No  .  Drug use: No     Allergies   Doxycycline; Clindamycin/lincomycin; Compazine [prochlorperazine edisylate]; Iodine; Sulfa antibiotics; and Penicillins   Review of Systems Review of Systems  Constitutional: Negative for activity change, appetite change and fever.  HENT: Positive for sore throat. Negative for congestion, ear discharge and postnasal drip.   Eyes: Negative for visual disturbance.  Respiratory: Negative for cough, chest tightness and shortness of breath.   Cardiovascular: Negative for chest pain.  Gastrointestinal: Negative for abdominal pain, nausea and vomiting.  Genitourinary: Negative for dysuria, hematuria, vaginal bleeding and vaginal discharge.  Skin: Positive for rash.  Neurological: Negative for dizziness, weakness, light-headedness and headaches.    all other systems  are negative except as noted in the HPI and PMH.    Physical Exam Updated Vital Signs BP (!) 175/64   Pulse 65   Temp 98.1 F (36.7 C) (Oral)   Resp 16   Ht 5' 3.5" (1.613 m)   Wt 57.6 kg (127 lb)   SpO2 99%   BMI 22.14 kg/m   Physical Exam  Constitutional: She is oriented to person, place, and time. She appears well-developed and well-nourished. No distress.  HENT:  Head: Normocephalic and atraumatic.  Mouth/Throat: Oropharynx is clear and moist. No oropharyngeal exudate.  Eyes: Conjunctivae and EOM are normal. Pupils are equal, round, and reactive to light.  Neck: Normal range of motion. Neck supple.  No meningismus.  Cardiovascular: Normal rate, regular rhythm, normal heart sounds and intact distal pulses.  No murmur heard. Pulmonary/Chest: Effort normal and breath sounds normal. No respiratory distress.  Abdominal: Soft. There is no tenderness. There is no rebound and no guarding.  Musculoskeletal: Normal range of motion. She exhibits no edema or tenderness.  Neurological: She is alert and oriented to person, place, and time. No cranial nerve deficit. She exhibits normal muscle tone. Coordination normal.  No ataxia on finger to nose bilaterally. No pronator drift. 5/5 strength throughout. CN 2-12 intact.Equal grip strength. Sensation intact.   Skin: Skin is warm. Rash noted.  Vesicular erythematous rash starts at lumbar spine and radiates down right buttock and posterior right leg.  Does not cross midline.  Some areas of excoriation overlying lower back  Psychiatric: She has a normal mood and affect. Her behavior is normal.  Nursing note and vitals reviewed.    ED Treatments / Results  Labs (all labs ordered are listed, but only abnormal results are displayed) Labs Reviewed  CBC WITH DIFFERENTIAL/PLATELET - Abnormal; Notable for the following components:      Result Value   RBC 3.05 (*)    Hemoglobin 9.7 (*)    HCT 28.6 (*)    All other components within normal  limits  BASIC METABOLIC PANEL - Abnormal; Notable for the following components:   Potassium 5.2 (*)    CO2 20 (*)    Glucose, Bld 111 (*)    BUN 42 (*)    Creatinine, Ser 3.22 (*)    GFR calc non Af Amer 12 (*)    GFR calc Af Amer 14 (*)    All other components within normal limits  RAPID STREP SCREEN (NOT AT [facility])    EKG  EKG Interpretation  Date/Time:  Saturday July 18 2017 23:37:48 EDT Ventricular Rate:  83 PR Interval:  184 QRS Duration: 76 QT Interval:  364 QTC Calculation: 427 R Axis:   0 Text Interpretation:  Normal sinus rhythm Septal infarct , age undetermined Abnormal ECG Artifact No significant change was found Confirmed by  Ezequiel Essex ([id number]) on 07/19/2017 4:52:13 AM       Radiology No results found.  Procedures Procedures (including critical care time)  Medications Ordered in ED Medications  HYDROcodone-acetaminophen (NORCO/VICODIN) 5-325 MG per tablet 1 tablet (1 tablet Oral Given 07/19/17 0420)  valACYclovir (VALTREX) tablet 1,000 mg (1,000 mg Oral Given 07/19/17 0425)     Initial Impression / Assessment and Plan / ED Course  I have reviewed the triage vital signs and the nursing notes.  Pertinent labs & imaging results that were available during my care of the patient were reviewed by me and considered in my medical decision making (see chart for details).    Presentation consistent with shingles.  No fever.  No chest pain or shortness of breath.  Patient's creatinine slightly worse from baseline.  Cr 3.2 was 2.4 in 2017.  Patient does not know what her baseline creatinine is. She has a maturing AVF but has not yet started dialysis.  She was known CKD and is being scheduled for dialysis.  She will be given a reduced dose of Valtrex given her kidney function.  Discussed with patient and son. PCP followup. Return precautions discussed. Final Clinical Impressions(s) / ED Diagnoses   Final diagnoses:  Herpes zoster without complication     ED Discharge Orders    None       Miliana Gangwer, Annie Main, MD 07/19/17 [phone]

## 2017-07-21 DIAGNOSIS — N184 Chronic kidney disease, stage 4 (severe): Secondary | ICD-10-CM | POA: Diagnosis not present

## 2017-07-21 DIAGNOSIS — B028 Zoster with other complications: Secondary | ICD-10-CM | POA: Diagnosis not present

## 2017-07-21 LAB — CULTURE, GROUP A STREP (THRC)

## 2017-07-22 DIAGNOSIS — D631 Anemia in chronic kidney disease: Secondary | ICD-10-CM | POA: Diagnosis not present

## 2017-07-22 DIAGNOSIS — N185 Chronic kidney disease, stage 5: Secondary | ICD-10-CM | POA: Diagnosis not present

## 2017-07-22 DIAGNOSIS — B029 Zoster without complications: Secondary | ICD-10-CM | POA: Diagnosis not present

## 2017-07-22 DIAGNOSIS — I120 Hypertensive chronic kidney disease with stage 5 chronic kidney disease or end stage renal disease: Secondary | ICD-10-CM | POA: Diagnosis not present

## 2017-09-24 DIAGNOSIS — D631 Anemia in chronic kidney disease: Secondary | ICD-10-CM | POA: Diagnosis not present

## 2017-09-24 DIAGNOSIS — I120 Hypertensive chronic kidney disease with stage 5 chronic kidney disease or end stage renal disease: Secondary | ICD-10-CM | POA: Diagnosis not present

## 2017-09-24 DIAGNOSIS — N185 Chronic kidney disease, stage 5: Secondary | ICD-10-CM | POA: Diagnosis not present

## 2017-09-24 DIAGNOSIS — N281 Cyst of kidney, acquired: Secondary | ICD-10-CM | POA: Diagnosis not present

## 2017-09-24 DIAGNOSIS — N2581 Secondary hyperparathyroidism of renal origin: Secondary | ICD-10-CM | POA: Diagnosis not present

## 2017-11-02 DIAGNOSIS — H04123 Dry eye syndrome of bilateral lacrimal glands: Secondary | ICD-10-CM | POA: Diagnosis not present

## 2017-11-02 DIAGNOSIS — H5203 Hypermetropia, bilateral: Secondary | ICD-10-CM | POA: Diagnosis not present

## 2017-11-02 DIAGNOSIS — H52223 Regular astigmatism, bilateral: Secondary | ICD-10-CM | POA: Diagnosis not present

## 2017-11-02 DIAGNOSIS — H524 Presbyopia: Secondary | ICD-10-CM | POA: Diagnosis not present

## 2017-11-23 DIAGNOSIS — D631 Anemia in chronic kidney disease: Secondary | ICD-10-CM | POA: Diagnosis not present

## 2017-11-23 DIAGNOSIS — N2581 Secondary hyperparathyroidism of renal origin: Secondary | ICD-10-CM | POA: Diagnosis not present

## 2017-11-23 DIAGNOSIS — N185 Chronic kidney disease, stage 5: Secondary | ICD-10-CM | POA: Diagnosis not present

## 2017-11-23 DIAGNOSIS — I120 Hypertensive chronic kidney disease with stage 5 chronic kidney disease or end stage renal disease: Secondary | ICD-10-CM | POA: Diagnosis not present

## 2017-11-23 DIAGNOSIS — N281 Cyst of kidney, acquired: Secondary | ICD-10-CM | POA: Diagnosis not present

## 2017-12-16 DIAGNOSIS — E7849 Other hyperlipidemia: Secondary | ICD-10-CM | POA: Diagnosis not present

## 2017-12-16 DIAGNOSIS — N2581 Secondary hyperparathyroidism of renal origin: Secondary | ICD-10-CM | POA: Diagnosis not present

## 2017-12-16 DIAGNOSIS — M791 Myalgia, unspecified site: Secondary | ICD-10-CM | POA: Diagnosis not present

## 2017-12-16 DIAGNOSIS — I10 Essential (primary) hypertension: Secondary | ICD-10-CM | POA: Diagnosis not present

## 2017-12-16 DIAGNOSIS — Z1389 Encounter for screening for other disorder: Secondary | ICD-10-CM | POA: Diagnosis not present

## 2017-12-16 DIAGNOSIS — D638 Anemia in other chronic diseases classified elsewhere: Secondary | ICD-10-CM | POA: Diagnosis not present

## 2017-12-16 DIAGNOSIS — Z6822 Body mass index (BMI) 22.0-22.9, adult: Secondary | ICD-10-CM | POA: Diagnosis not present

## 2017-12-16 DIAGNOSIS — N184 Chronic kidney disease, stage 4 (severe): Secondary | ICD-10-CM | POA: Diagnosis not present

## 2017-12-16 DIAGNOSIS — E038 Other specified hypothyroidism: Secondary | ICD-10-CM | POA: Diagnosis not present

## 2017-12-16 DIAGNOSIS — I129 Hypertensive chronic kidney disease with stage 1 through stage 4 chronic kidney disease, or unspecified chronic kidney disease: Secondary | ICD-10-CM | POA: Diagnosis not present

## 2017-12-16 DIAGNOSIS — Z8619 Personal history of other infectious and parasitic diseases: Secondary | ICD-10-CM | POA: Diagnosis not present

## 2018-01-27 DIAGNOSIS — N185 Chronic kidney disease, stage 5: Secondary | ICD-10-CM | POA: Diagnosis not present

## 2018-01-27 DIAGNOSIS — I120 Hypertensive chronic kidney disease with stage 5 chronic kidney disease or end stage renal disease: Secondary | ICD-10-CM | POA: Diagnosis not present

## 2018-01-27 DIAGNOSIS — D631 Anemia in chronic kidney disease: Secondary | ICD-10-CM | POA: Diagnosis not present

## 2018-01-27 DIAGNOSIS — N2581 Secondary hyperparathyroidism of renal origin: Secondary | ICD-10-CM | POA: Diagnosis not present

## 2018-01-27 DIAGNOSIS — N281 Cyst of kidney, acquired: Secondary | ICD-10-CM | POA: Diagnosis not present

## 2018-03-22 DIAGNOSIS — Z6822 Body mass index (BMI) 22.0-22.9, adult: Secondary | ICD-10-CM | POA: Diagnosis not present

## 2018-03-22 DIAGNOSIS — I10 Essential (primary) hypertension: Secondary | ICD-10-CM | POA: Diagnosis not present

## 2018-03-22 DIAGNOSIS — D638 Anemia in other chronic diseases classified elsewhere: Secondary | ICD-10-CM | POA: Diagnosis not present

## 2018-03-22 DIAGNOSIS — R6883 Chills (without fever): Secondary | ICD-10-CM | POA: Diagnosis not present

## 2018-03-22 DIAGNOSIS — N184 Chronic kidney disease, stage 4 (severe): Secondary | ICD-10-CM | POA: Diagnosis not present

## 2018-03-22 DIAGNOSIS — B9789 Other viral agents as the cause of diseases classified elsewhere: Secondary | ICD-10-CM | POA: Diagnosis not present

## 2018-03-31 DIAGNOSIS — D631 Anemia in chronic kidney disease: Secondary | ICD-10-CM | POA: Diagnosis not present

## 2018-03-31 DIAGNOSIS — I120 Hypertensive chronic kidney disease with stage 5 chronic kidney disease or end stage renal disease: Secondary | ICD-10-CM | POA: Diagnosis not present

## 2018-03-31 DIAGNOSIS — N2581 Secondary hyperparathyroidism of renal origin: Secondary | ICD-10-CM | POA: Diagnosis not present

## 2018-03-31 DIAGNOSIS — N185 Chronic kidney disease, stage 5: Secondary | ICD-10-CM | POA: Diagnosis not present

## 2018-06-03 DIAGNOSIS — D631 Anemia in chronic kidney disease: Secondary | ICD-10-CM | POA: Diagnosis not present

## 2018-06-03 DIAGNOSIS — N2581 Secondary hyperparathyroidism of renal origin: Secondary | ICD-10-CM | POA: Diagnosis not present

## 2018-06-03 DIAGNOSIS — N281 Cyst of kidney, acquired: Secondary | ICD-10-CM | POA: Diagnosis not present

## 2018-06-03 DIAGNOSIS — N185 Chronic kidney disease, stage 5: Secondary | ICD-10-CM | POA: Diagnosis not present

## 2018-06-03 DIAGNOSIS — I120 Hypertensive chronic kidney disease with stage 5 chronic kidney disease or end stage renal disease: Secondary | ICD-10-CM | POA: Diagnosis not present

## 2018-06-03 DIAGNOSIS — N189 Chronic kidney disease, unspecified: Secondary | ICD-10-CM | POA: Diagnosis not present

## 2018-08-03 DIAGNOSIS — N2581 Secondary hyperparathyroidism of renal origin: Secondary | ICD-10-CM | POA: Diagnosis not present

## 2018-08-03 DIAGNOSIS — N281 Cyst of kidney, acquired: Secondary | ICD-10-CM | POA: Diagnosis not present

## 2018-08-03 DIAGNOSIS — I120 Hypertensive chronic kidney disease with stage 5 chronic kidney disease or end stage renal disease: Secondary | ICD-10-CM | POA: Diagnosis not present

## 2018-08-03 DIAGNOSIS — D631 Anemia in chronic kidney disease: Secondary | ICD-10-CM | POA: Diagnosis not present

## 2018-08-03 DIAGNOSIS — N185 Chronic kidney disease, stage 5: Secondary | ICD-10-CM | POA: Diagnosis not present

## 2018-08-19 DIAGNOSIS — E038 Other specified hypothyroidism: Secondary | ICD-10-CM | POA: Diagnosis not present

## 2018-08-19 DIAGNOSIS — E7849 Other hyperlipidemia: Secondary | ICD-10-CM | POA: Diagnosis not present

## 2018-08-19 DIAGNOSIS — I10 Essential (primary) hypertension: Secondary | ICD-10-CM | POA: Diagnosis not present

## 2018-08-25 DIAGNOSIS — R82998 Other abnormal findings in urine: Secondary | ICD-10-CM | POA: Diagnosis not present

## 2018-08-26 DIAGNOSIS — N184 Chronic kidney disease, stage 4 (severe): Secondary | ICD-10-CM | POA: Diagnosis not present

## 2018-08-26 DIAGNOSIS — I129 Hypertensive chronic kidney disease with stage 1 through stage 4 chronic kidney disease, or unspecified chronic kidney disease: Secondary | ICD-10-CM | POA: Diagnosis not present

## 2018-08-26 DIAGNOSIS — N2581 Secondary hyperparathyroidism of renal origin: Secondary | ICD-10-CM | POA: Diagnosis not present

## 2018-08-26 DIAGNOSIS — D638 Anemia in other chronic diseases classified elsewhere: Secondary | ICD-10-CM | POA: Diagnosis not present

## 2018-08-26 DIAGNOSIS — E039 Hypothyroidism, unspecified: Secondary | ICD-10-CM | POA: Diagnosis not present

## 2018-08-26 DIAGNOSIS — Z8619 Personal history of other infectious and parasitic diseases: Secondary | ICD-10-CM | POA: Diagnosis not present

## 2018-08-26 DIAGNOSIS — Z0000 Encounter for general adult medical examination without abnormal findings: Secondary | ICD-10-CM | POA: Diagnosis not present

## 2018-09-30 DIAGNOSIS — N281 Cyst of kidney, acquired: Secondary | ICD-10-CM | POA: Diagnosis not present

## 2018-09-30 DIAGNOSIS — N185 Chronic kidney disease, stage 5: Secondary | ICD-10-CM | POA: Diagnosis not present

## 2018-09-30 DIAGNOSIS — D631 Anemia in chronic kidney disease: Secondary | ICD-10-CM | POA: Diagnosis not present

## 2018-09-30 DIAGNOSIS — I120 Hypertensive chronic kidney disease with stage 5 chronic kidney disease or end stage renal disease: Secondary | ICD-10-CM | POA: Diagnosis not present

## 2018-09-30 DIAGNOSIS — N2581 Secondary hyperparathyroidism of renal origin: Secondary | ICD-10-CM | POA: Diagnosis not present

## 2018-12-01 DIAGNOSIS — N2581 Secondary hyperparathyroidism of renal origin: Secondary | ICD-10-CM | POA: Diagnosis not present

## 2018-12-01 DIAGNOSIS — I120 Hypertensive chronic kidney disease with stage 5 chronic kidney disease or end stage renal disease: Secondary | ICD-10-CM | POA: Diagnosis not present

## 2018-12-01 DIAGNOSIS — N189 Chronic kidney disease, unspecified: Secondary | ICD-10-CM | POA: Diagnosis not present

## 2018-12-01 DIAGNOSIS — N185 Chronic kidney disease, stage 5: Secondary | ICD-10-CM | POA: Diagnosis not present

## 2018-12-01 DIAGNOSIS — D631 Anemia in chronic kidney disease: Secondary | ICD-10-CM | POA: Diagnosis not present

## 2018-12-04 DIAGNOSIS — K8020 Calculus of gallbladder without cholecystitis without obstruction: Secondary | ICD-10-CM

## 2018-12-04 HISTORY — DX: Calculus of gallbladder without cholecystitis without obstruction: K80.20

## 2018-12-20 ENCOUNTER — Other Ambulatory Visit: Payer: Self-pay

## 2018-12-20 ENCOUNTER — Inpatient Hospital Stay (HOSPITAL_COMMUNITY)
Admission: EM | Admit: 2018-12-20 | Discharge: 2018-12-28 | DRG: 418 | Disposition: A | Discharge status: Home Health | Payer: Medicare Other | Attending: Internal Medicine | Admitting: Internal Medicine

## 2018-12-20 ENCOUNTER — Emergency Department (HOSPITAL_COMMUNITY): Payer: Medicare Other

## 2018-12-20 ENCOUNTER — Encounter (HOSPITAL_COMMUNITY): Payer: Self-pay | Admitting: Emergency Medicine

## 2018-12-20 DIAGNOSIS — R0989 Other specified symptoms and signs involving the circulatory and respiratory systems: Secondary | ICD-10-CM | POA: Diagnosis present

## 2018-12-20 DIAGNOSIS — Z79899 Other long term (current) drug therapy: Secondary | ICD-10-CM

## 2018-12-20 DIAGNOSIS — E872 Acidosis: Secondary | ICD-10-CM | POA: Diagnosis present

## 2018-12-20 DIAGNOSIS — K56609 Unspecified intestinal obstruction, unspecified as to partial versus complete obstruction: Secondary | ICD-10-CM | POA: Diagnosis not present

## 2018-12-20 DIAGNOSIS — K838 Other specified diseases of biliary tract: Secondary | ICD-10-CM | POA: Diagnosis not present

## 2018-12-20 DIAGNOSIS — E876 Hypokalemia: Secondary | ICD-10-CM | POA: Diagnosis present

## 2018-12-20 DIAGNOSIS — N289 Disorder of kidney and ureter, unspecified: Secondary | ICD-10-CM | POA: Diagnosis not present

## 2018-12-20 DIAGNOSIS — D631 Anemia in chronic kidney disease: Secondary | ICD-10-CM | POA: Diagnosis present

## 2018-12-20 DIAGNOSIS — R140 Abdominal distension (gaseous): Secondary | ICD-10-CM | POA: Diagnosis not present

## 2018-12-20 DIAGNOSIS — R52 Pain, unspecified: Secondary | ICD-10-CM | POA: Diagnosis not present

## 2018-12-20 DIAGNOSIS — I120 Hypertensive chronic kidney disease with stage 5 chronic kidney disease or end stage renal disease: Secondary | ICD-10-CM | POA: Diagnosis present

## 2018-12-20 DIAGNOSIS — K913 Postprocedural intestinal obstruction, unspecified as to partial versus complete: Secondary | ICD-10-CM | POA: Diagnosis not present

## 2018-12-20 DIAGNOSIS — K559 Vascular disorder of intestine, unspecified: Secondary | ICD-10-CM

## 2018-12-20 DIAGNOSIS — Z79890 Hormone replacement therapy (postmenopausal): Secondary | ICD-10-CM | POA: Diagnosis not present

## 2018-12-20 DIAGNOSIS — R1110 Vomiting, unspecified: Secondary | ICD-10-CM

## 2018-12-20 DIAGNOSIS — K8000 Calculus of gallbladder with acute cholecystitis without obstruction: Principal | ICD-10-CM | POA: Diagnosis present

## 2018-12-20 DIAGNOSIS — R1084 Generalized abdominal pain: Secondary | ICD-10-CM | POA: Diagnosis not present

## 2018-12-20 DIAGNOSIS — K219 Gastro-esophageal reflux disease without esophagitis: Secondary | ICD-10-CM | POA: Diagnosis present

## 2018-12-20 DIAGNOSIS — K8020 Calculus of gallbladder without cholecystitis without obstruction: Secondary | ICD-10-CM

## 2018-12-20 DIAGNOSIS — R1010 Upper abdominal pain, unspecified: Secondary | ICD-10-CM | POA: Diagnosis not present

## 2018-12-20 DIAGNOSIS — N185 Chronic kidney disease, stage 5: Secondary | ICD-10-CM | POA: Diagnosis present

## 2018-12-20 DIAGNOSIS — I499 Cardiac arrhythmia, unspecified: Secondary | ICD-10-CM | POA: Diagnosis present

## 2018-12-20 DIAGNOSIS — Z86718 Personal history of other venous thrombosis and embolism: Secondary | ICD-10-CM

## 2018-12-20 DIAGNOSIS — Z992 Dependence on renal dialysis: Secondary | ICD-10-CM

## 2018-12-20 DIAGNOSIS — E039 Hypothyroidism, unspecified: Secondary | ICD-10-CM | POA: Diagnosis present

## 2018-12-20 DIAGNOSIS — K7031 Alcoholic cirrhosis of liver with ascites: Secondary | ICD-10-CM | POA: Diagnosis not present

## 2018-12-20 DIAGNOSIS — K8001 Calculus of gallbladder with acute cholecystitis with obstruction: Secondary | ICD-10-CM | POA: Diagnosis not present

## 2018-12-20 DIAGNOSIS — N186 End stage renal disease: Secondary | ICD-10-CM | POA: Diagnosis not present

## 2018-12-20 DIAGNOSIS — Z20828 Contact with and (suspected) exposure to other viral communicable diseases: Secondary | ICD-10-CM | POA: Diagnosis present

## 2018-12-20 DIAGNOSIS — K529 Noninfective gastroenteritis and colitis, unspecified: Secondary | ICD-10-CM | POA: Diagnosis not present

## 2018-12-20 DIAGNOSIS — R109 Unspecified abdominal pain: Secondary | ICD-10-CM | POA: Diagnosis not present

## 2018-12-20 DIAGNOSIS — I10 Essential (primary) hypertension: Secondary | ICD-10-CM | POA: Diagnosis not present

## 2018-12-20 DIAGNOSIS — K8010 Calculus of gallbladder with chronic cholecystitis without obstruction: Secondary | ICD-10-CM | POA: Diagnosis not present

## 2018-12-20 DIAGNOSIS — N179 Acute kidney failure, unspecified: Secondary | ICD-10-CM | POA: Diagnosis present

## 2018-12-20 LAB — COMPREHENSIVE METABOLIC PANEL
ALT: 15 U/L (ref 0–44)
AST: 17 U/L (ref 15–41)
Albumin: 3.7 g/dL (ref 3.5–5.0)
Alkaline Phosphatase: 50 U/L (ref 38–126)
Anion gap: 9 (ref 5–15)
BUN: 65 mg/dL — ABNORMAL HIGH (ref 8–23)
CO2: 18 mmol/L — ABNORMAL LOW (ref 22–32)
Calcium: 9.2 mg/dL (ref 8.9–10.3)
Chloride: 113 mmol/L — ABNORMAL HIGH (ref 98–111)
Creatinine, Ser: 4.29 mg/dL — ABNORMAL HIGH (ref 0.44–1.00)
GFR calc Af Amer: 10 mL/min — ABNORMAL LOW (ref >60)
GFR calc non Af Amer: 9 mL/min — ABNORMAL LOW (ref >60)
Glucose, Bld: 129 mg/dL — ABNORMAL HIGH (ref 70–99)
Potassium: 4.5 mmol/L (ref 3.5–5.1)
Sodium: 140 mmol/L (ref 135–145)
Total Bilirubin: 0.7 mg/dL (ref 0.3–1.2)
Total Protein: 6.2 g/dL — ABNORMAL LOW (ref 6.5–8.1)

## 2018-12-20 LAB — CBC WITH DIFFERENTIAL/PLATELET
Abs Immature Granulocytes: 0.01 10*3/uL (ref 0.00–0.07)
Basophils Absolute: 0 10*3/uL (ref 0.0–0.1)
Basophils Relative: 1 %
Eosinophils Absolute: 0.1 10*3/uL (ref 0.0–0.5)
Eosinophils Relative: 2 %
HCT: 30.7 % — ABNORMAL LOW (ref 36.0–46.0)
Hemoglobin: 10 g/dL — ABNORMAL LOW (ref 12.0–15.0)
Immature Granulocytes: 0 %
Lymphocytes Relative: 26 %
Lymphs Abs: 1.5 10*3/uL (ref 0.7–4.0)
MCH: 31.8 pg (ref 26.0–34.0)
MCHC: 32.6 g/dL (ref 30.0–36.0)
MCV: 97.8 fL (ref 80.0–100.0)
Monocytes Absolute: 0.4 10*3/uL (ref 0.1–1.0)
Monocytes Relative: 7 %
Neutro Abs: 3.7 10*3/uL (ref 1.7–7.7)
Neutrophils Relative %: 64 %
Platelets: 189 10*3/uL (ref 150–400)
RBC: 3.14 MIL/uL — ABNORMAL LOW (ref 3.87–5.11)
RDW: 12.7 % (ref 11.5–15.5)
WBC: 5.7 10*3/uL (ref 4.0–10.5)
nRBC: 0 % (ref 0.0–0.2)

## 2018-12-20 LAB — LIPASE, BLOOD: Lipase: 35 U/L (ref 11–51)

## 2018-12-20 LAB — SARS CORONAVIRUS 2 BY RT PCR (HOSPITAL ORDER, PERFORMED IN ~~LOC~~ HOSPITAL LAB): SARS Coronavirus 2: NEGATIVE

## 2018-12-20 LAB — LACTIC ACID, PLASMA
Lactic Acid, Venous: 1.3 mmol/L (ref 0.5–1.9)
Lactic Acid, Venous: 0.9 mmol/L (ref 0.5–1.9)
Lactic Acid, Venous: 0.9 mmol/L (ref 0.5–1.9)

## 2018-12-20 MED ORDER — HEPARIN SODIUM (PORCINE) 5000 UNIT/ML IJ SOLN
5000.0000 [IU] | Freq: Two times a day (BID) | INTRAMUSCULAR | Status: DC
  Administered 2018-12-20 – 2018-12-22 (×4): 5000 [IU] via SUBCUTANEOUS
  Filled 2018-12-20 (×4): qty 1

## 2018-12-20 MED ORDER — DEXTROSE-NACL 5-0.45 % IV SOLN
INTRAVENOUS | Status: DC
  Administered 2018-12-20: 18:00:00 via INTRAVENOUS

## 2018-12-20 MED ORDER — METRONIDAZOLE IN NACL 5-0.79 MG/ML-% IV SOLN: 500.0000 mg | Freq: Three times a day (TID) | INTRAVENOUS | Status: DC

## 2018-12-20 MED ORDER — SODIUM CHLORIDE 0.9 % IV SOLN
2.0000 g | INTRAVENOUS | Status: DC
  Administered 2018-12-20 – 2018-12-28 (×9): 2 g via INTRAVENOUS
  Filled 2018-12-20 (×9): qty 20

## 2018-12-20 MED ORDER — LEVOTHYROXINE SODIUM 88 MCG PO TABS
88.0000 ug | ORAL_TABLET | Freq: Every day | ORAL | Status: DC
  Administered 2018-12-22 – 2018-12-28 (×7): 88 ug via ORAL
  Filled 2018-12-20 (×8): qty 1

## 2018-12-20 MED ORDER — ONDANSETRON HCL 4 MG/2ML IJ SOLN
4.0000 mg | Freq: Four times a day (QID) | INTRAMUSCULAR | Status: DC | PRN
  Administered 2018-12-20 – 2018-12-27 (×9): 4 mg via INTRAVENOUS
  Filled 2018-12-20 (×9): qty 2

## 2018-12-20 MED ORDER — MORPHINE SULFATE (PF) 4 MG/ML IV SOLN
4.0000 mg | INTRAVENOUS | Status: AC | PRN
  Administered 2018-12-20 (×2): 4 mg via INTRAVENOUS
  Filled 2018-12-20 (×2): qty 1

## 2018-12-20 MED ORDER — CALCITRIOL 0.25 MCG PO CAPS
0.2500 ug | ORAL_CAPSULE | ORAL | Status: DC
  Administered 2018-12-20: 0.25 ug via ORAL
  Filled 2018-12-20 (×2): qty 1

## 2018-12-20 MED ORDER — HYDROCODONE-ACETAMINOPHEN 5-325 MG PO TABS
1.0000 | ORAL_TABLET | ORAL | Status: DC | PRN
  Administered 2018-12-20 – 2018-12-21 (×4): 1 via ORAL
  Filled 2018-12-20 (×4): qty 1

## 2018-12-20 MED ORDER — HYPROMELLOSE (GONIOSCOPIC) 2.5 % OP SOLN
1.0000 [drp] | OPHTHALMIC | Status: DC | PRN
  Filled 2018-12-20: qty 15

## 2018-12-20 MED ORDER — FAMOTIDINE 20 MG PO TABS
20.0000 mg | ORAL_TABLET | Freq: Once | ORAL | Status: AC
  Administered 2018-12-20: 20 mg via ORAL
  Filled 2018-12-20: qty 1

## 2018-12-20 MED ORDER — METRONIDAZOLE IN NACL 5-0.79 MG/ML-% IV SOLN
500.0000 mg | Freq: Three times a day (TID) | INTRAVENOUS | Status: DC
  Administered 2018-12-20 – 2018-12-28 (×25): 500 mg via INTRAVENOUS
  Filled 2018-12-20 (×26): qty 100

## 2018-12-20 MED ORDER — HYDROMORPHONE HCL 1 MG/ML IJ SOLN
0.5000 mg | Freq: Once | INTRAMUSCULAR | Status: AC
  Administered 2018-12-20: 0.5 mg via INTRAVENOUS
  Filled 2018-12-20: qty 1

## 2018-12-20 MED ORDER — ONDANSETRON HCL 4 MG PO TABS: 4.0000 mg | ORAL_TABLET | Freq: Four times a day (QID) | ORAL | Status: DC | PRN

## 2018-12-20 MED ORDER — MORPHINE SULFATE (PF) 2 MG/ML IV SOLN
2.0000 mg | INTRAVENOUS | Status: DC | PRN
  Administered 2018-12-20 – 2018-12-21 (×4): 2 mg via INTRAVENOUS
  Filled 2018-12-20 (×4): qty 1

## 2018-12-20 MED ORDER — SODIUM CHLORIDE 0.9 % IV BOLUS
250.0000 mL | Freq: Once | INTRAVENOUS | Status: AC
  Administered 2018-12-20: 250 mL via INTRAVENOUS

## 2018-12-20 MED ORDER — ONDANSETRON HCL 4 MG/2ML IJ SOLN
4.0000 mg | Freq: Once | INTRAMUSCULAR | Status: AC
  Administered 2018-12-20: 4 mg via INTRAVENOUS
  Filled 2018-12-20: qty 2

## 2018-12-20 MED ORDER — DILTIAZEM HCL ER COATED BEADS 180 MG PO CP24
300.0000 mg | ORAL_CAPSULE | Freq: Every day | ORAL | Status: DC
  Administered 2018-12-20 – 2018-12-28 (×8): 300 mg via ORAL
  Filled 2018-12-20 (×9): qty 1

## 2018-12-20 MED ORDER — DILTIAZEM HCL ER BEADS 300 MG PO CP24: 300.0000 mg | ORAL_CAPSULE | Freq: Every day | ORAL | Status: DC

## 2018-12-20 MED ORDER — DOXAZOSIN MESYLATE 2 MG PO TABS
8.0000 mg | ORAL_TABLET | Freq: Every day | ORAL | Status: DC
  Administered 2018-12-20 – 2018-12-28 (×8): 8 mg via ORAL
  Filled 2018-12-20 (×7): qty 4
  Filled 2018-12-20: qty 1
  Filled 2018-12-20: qty 4

## 2018-12-20 NOTE — ED Provider Notes (Addendum)
[facility] EMERGENCY DEPARTMENT Provider Note   CSN: [id number] Arrival date & time: 12/20/18  4967     History   Chief Complaint Chief Complaint  Patient presents with   Abdominal Pain    HPI Yvonne Powell is a 85 y.o. female.     Patient with history of chronic kidney disease (nearing dialysis), no abdominal surgery history presents the emergency department with acute onset of severe upper abdominal pain about 6:30 AM.  Patient states that initially she had pain up into her chest that she relates to her acid reflux.  She took Pepcid which improved her symptoms.  After that time she developed a bandlike tightness across her upper abdomen on both the right and left side.  She states it feels like it goes around her abdomen but is very tight in the front.  Patient denies any nausea, vomiting, diarrhea.  No urinary symptoms.  No history of diverticulitis or other abdominal problems.  She is not on anticoagulation.  No other treatments prior to arrival.     Past Medical History:  Diagnosis Date   Anemia    due to kidney   DVT (deep venous thrombosis) (HCC)    hx   Hypertension    Renal disorder    Thyroid disease    graves dx    There are no active problems to display for this patient.   Past Surgical History:  Procedure Laterality Date   AV FISTULA PLACEMENT Right 04/07/2017   Procedure: ARTERIOVENOUS (AV) FISTULA CREATION;  Surgeon: Elam Dutch, MD;  Location: [facility];  Service: Vascular;  Laterality: Right;   COLONOSCOPY     DILATION AND CURETTAGE OF UTERUS     EYE SURGERY     cactaracts   LIPOMA EXCISION     LUMBAR EPIDURAL INJECTION  2018     OB History   No obstetric history on file.      Home Medications    Prior to Admission medications   Medication Sig Start Date End Date Taking? Authorizing Provider  calcitRIOL (ROCALTROL) 0.25 MCG capsule Take 0.25 mcg by mouth every other day.    [provider]    Cholecalciferol (VITAMIN D) 2000 units tablet Take 2,000 Units by mouth daily.    [provider]  diltiazem (TIAZAC) 300 MG 24 hr capsule Take 300 mg by mouth daily.    [provider]  diphenhydrAMINE (BENADRYL) 25 MG tablet Take 1 tablet (25 mg total) by mouth every 6 (six) hours as needed. 04/12/17   Horton, Barbette Hair, MD  docusate sodium (COLACE) 100 MG capsule Take 100 mg by mouth daily as needed for moderate constipation.     [provider]  doxazosin (CARDURA) 8 MG tablet Take 8 mg by mouth daily.    [provider]  HYDROcodone-acetaminophen (NORCO/VICODIN) 5-325 MG tablet Take 1 tablet by mouth every 4 (four) hours as needed. 07/19/17   Rancour, Annie Main, MD  Iron-FA-DSS-B Cmplx-Vit C (NEPHRON FA PO) Take 1 tablet by mouth daily.    [provider]  levothyroxine (SYNTHROID, LEVOTHROID) 88 MCG tablet Take 88 mcg by mouth daily before breakfast.    [provider]  oxyCODONE-acetaminophen (ROXICET) 5-325 MG tablet Take 1 tablet by mouth every 8 (eight) hours as needed for severe pain. 04/07/17   Gabriel Earing, PA-C    Family History No family history on file.  Social History Social History   Tobacco Use   Smoking status: Never Smoker  Smokeless tobacco: Never Used  Substance Use Topics   Alcohol use: No   Drug use: No     Allergies   Doxycycline, Clindamycin/lincomycin, Compazine [prochlorperazine edisylate], Iodine, Sulfa antibiotics, and Penicillins   Review of Systems Review of Systems  Constitutional: Negative for fever.  HENT: Negative for rhinorrhea and sore throat.   Eyes: Negative for redness.  Respiratory: Negative for cough and shortness of breath.   Cardiovascular: Negative for chest pain.  Gastrointestinal: Positive for abdominal pain. Negative for diarrhea, nausea and vomiting.  Genitourinary: Negative for dysuria.  Musculoskeletal: Negative for myalgias.  Skin: Negative for rash.   Neurological: Negative for headaches.     Physical Exam Updated Vital Signs BP (!) 123/111    Pulse 76    Temp (!) 97.4 F (36.3 C) (Oral)    Resp 16    Ht 5\' 3"  (1.6 m)    Wt 57.6 kg    SpO2 100%    BMI 22.50 kg/m   Physical Exam Vitals signs and nursing note reviewed.  Constitutional:      General: She is in acute distress (Patient leaning forward, wincing in pain).     Appearance: She is well-developed.  HENT:     Head: Normocephalic and atraumatic.  Eyes:     General:        Right eye: No discharge.        Left eye: No discharge.     Conjunctiva/sclera: Conjunctivae normal.  Neck:     Musculoskeletal: Normal range of motion and neck supple.  Cardiovascular:     Rate and Rhythm: Normal rate and regular rhythm.     Heart sounds: Normal heart sounds.  Pulmonary:     Effort: Pulmonary effort is normal.     Breath sounds: Normal breath sounds.  Abdominal:     Palpations: Abdomen is soft.     Tenderness: There is abdominal tenderness in the right upper quadrant, epigastric area and left upper quadrant. There is no guarding or rebound.     Hernia: No hernia is present.  Skin:    General: Skin is warm and dry.  Neurological:     Mental Status: She is alert.      ED Treatments / Results  Labs (all labs ordered are listed, but only abnormal results are displayed) Labs Reviewed  CBC WITH DIFFERENTIAL/PLATELET - Abnormal; Notable for the following components:      Result Value   RBC 3.14 (*)    Hemoglobin 10.0 (*)    HCT 30.7 (*)    All other components within normal limits  COMPREHENSIVE METABOLIC PANEL - Abnormal; Notable for the following components:   Chloride 113 (*)    CO2 18 (*)    Glucose, Bld 129 (*)    BUN 65 (*)    Creatinine, Ser 4.29 (*)    Total Protein 6.2 (*)    GFR calc non Af Amer 9 (*)    GFR calc Af Amer 10 (*)    All other components within normal limits  SARS CORONAVIRUS 2 (HOSPITAL ORDER, [facility])  LIPASE,  BLOOD  LACTIC ACID, PLASMA  LACTIC ACID, PLASMA  LACTIC ACID, PLASMA    EKG None  Radiology Ct Abdomen Pelvis Wo Contrast  Result Date: 12/20/2018 CLINICAL DATA:  Abdominal pain starting this morning. Abdominal distention. Chronic renal disease. EXAM: CT ABDOMEN AND PELVIS WITHOUT CONTRAST TECHNIQUE: Multidetector CT imaging of the abdomen and pelvis was performed following the standard protocol without  IV contrast. COMPARISON:  None. FINDINGS: Lower chest: Patchy ground-glass opacities with some associated interstitial accentuation in both lower lobes, in the lingula, and in the right middle lobe. Descending thoracic aortic atherosclerotic calcification. Hepatobiliary: Mixed density in the gallbladder fundus with calcified elements, nitrogen gas elements, and surrounding density compatible with either large conglomerate gallstones or gallstones with surrounding sludge. Underlying mass of the gallbladder is less likely. I do not see gallbladder wall thickening in the proximal gallbladder. Noncontrast CT appearance of the liver otherwise unremarkable. Pancreas: Unremarkable Spleen: Unremarkable Adrenals/Urinary Tract: Both adrenal glands appear normal. The noncontrast CT appearance of the right kidney is unremarkable. In the left kidney upper pole, there is a homogeneously dense 3.3 by 2.9 cm partially exophytic lesion with internal density of 61 Hounsfield units, more dense than the adjacent renal parenchyma. Posteriorly in the right kidney lower pole there is a 1.1 cm hypodense lesion on image 28/3, probably a cyst. Centrally along the right kidney lower pole and partially extending into the renal pelvis, a 2.7 by 2.1 cm fluid density lesion is present on image 42/6, probably a parapelvic cyst but not entirely specific. In the right kidney upper pole, adjacent to the homogeneous complex lesion, there is a somewhat more heterogeneous 3.0 by 2.3 cm lesion shown on image 36/6, which appears to probably  have a punctate internal calcification. Stomach/Bowel: There is abnormal stranding in loops of small bowel mesentery in the central and right upper pelvis. Normal appendix. No dilated bowel. No pneumatosis. Sigmoid colon diverticulosis is present. No extraluminal gas or abscess. Vascular/Lymphatic: Aortoiliac atherosclerotic vascular disease. No pathologic adenopathy. Reproductive: Unremarkable Other: Small localized amount of ascites along the anterior margin of the inferior right hepatic lobe for example images 32-34 of series 3. Musculoskeletal: Left hip effusion. Questionable left iliopsoas bursitis. Grade 1 degenerative retrolisthesis at T12-L1 and grade 1 degenerative anterolisthesis at L3-4 and L4-5. Multilevel Schmorl's nodes in the lumbar spine with spondylosis and degenerative disc disease causing mild multilevel impingement. Degenerative arthropathy of both hips, left greater than right. Mild levoconvex lumbar scoliosis. IMPRESSION: 1. The dominant finding most likely correlating with the patient's symptoms is abnormal mesenteric stranding along several loops of small bowel the central and right upper pelvis. The appearance is nonspecific but could reflect edema associated with inflammation or even ischemia, although there is no appreciable pneumatosis or extraluminal gas at this time, and no dilated bowel. 2. There is a small amount of ascites adjacent to the inferior right hepatic lobe margin. 3. Large conglomerate gallstones filling much of the gallbladder, possibly with surrounding sludge. Strictly speaking, cholecystitis is not readily excluded given the unusual appearance of density around rim of the gallstone. 4. Multiple renal lesions are present. There are 2 complex lesions of the left kidney upper pole, 1 of these is homogeneously dense and probably a complex cyst, but the lesion next to it is somewhat more heterogeneous and could represent a mass/malignancy. I am aware that the patient cannot  have IV contrast at this time due to renal insufficiency. Possibilities for further workup might include pelvic ultrasound to assess for vascularity in either lesion, or follow up surveillance CT or MRI. 5. Patchy ground-glass opacities at both lung bases. Chronicity is not established. Possibilities may include hypersensitivity pneumonitis, atypical pneumonia, pulmonary hemorrhage, or chronic pulmonary embolus. 6. Left hip effusion with questionable left iliopsoas bursitis. Left greater than right degenerative hip arthropathy. 7. Mild multilevel impingement in the lumbar spine due to spondylosis and degenerative disc disease. 8.  Aortic Atherosclerosis (ICD10-I70.0). Electronically Signed   By: Van Clines M.D.   On: 12/20/2018 09:59    Procedures Procedures (including critical care time)  Medications Ordered in ED Medications  HYDROmorphone (DILAUDID) injection 0.5 mg (has no administration in time range)  sodium chloride 0.9 % bolus 250 mL (has no administration in time range)  metroNIDAZOLE (FLAGYL) IVPB 500 mg (has no administration in time range)  morphine 4 MG/ML injection 4 mg (4 mg Intravenous Given 12/20/18 1015)  ondansetron (ZOFRAN) injection 4 mg (4 mg Intravenous Given 12/20/18 0839)     Initial Impression / Assessment and Plan / ED Course  I have reviewed the triage vital signs and the nursing notes.  Pertinent labs & imaging results that were available during my care of the patient were reviewed by me and considered in my medical decision making (see chart for details).        Patient seen and examined. Work-up initiated. Medications ordered.   Vital signs reviewed and are as follows: BP (!) 123/111    Pulse 76    Temp (!) 97.4 F (36.3 C) (Oral)    Resp 16    Ht 5\' 3"  (1.6 m)    Wt 57.6 kg    SpO2 100%    BMI 22.50 kg/m   8:44 AM Discussed with Dr. Johnney Killian who will see patient.   10:26 AM initial evaluation showed normal white blood cell count, normal lactic  acid.  CT imaging shows multiple findings including possibility of early mesenteric ischemia and a dilated gallbladder with gallstones.  Liver and lipase are normal.  Patient continues to have significant pain and discomfort.  Pain is more generalized now. Additional pain medication given.  Will discuss case with general surgery and vascular surgery to get their opinion.  Creatinine today is slightly higher than baseline at 4.29.  10:32 AM Spoke with Dr. Donzetta Matters of vascular surgery -- they will consult. Requests mesenteric duplex. Awaiting callback from gen surgery.   10:46 AM Spoke with Jerene Pitch, general surgery. They will consult. Requests RUQ [place]. Ordered.   12:08 PM Lactate is stable. [place] with large gallstone. Pending mesenteric duplex.    12:36 PM patient still appears uncomfortable, additional pain medication ordered. Updated her on results. Mesenteric duplex reviewed with Dr. Donzetta Matters. This looks good.  States that venous clot is still on differential potentially.  Will admit to medicine.  12:45 PM Discussed with Dr. Laren Everts. Will give empiric abx until etiology of pain is more clear. Small fluid bolus ordered.   12:59 PM Discussed antibiotic coverage with ED pharmacist. Rocephin would be better choice than cipro given patient's age. Rocephin/Flagyl will be given as patient has no significant pseudomonal risk factors at this time.   CRITICAL CARE Performed by: Carlisle Cater PA-C Total critical care time: 40 minutes Critical care time was exclusive of separately billable procedures and treating other patients. Critical care was necessary to treat or prevent imminent or life-threatening deterioration. Critical care was time spent personally by me on the following activities: development of treatment plan with patient and/or surrogate as well as nursing, discussions with consultants, evaluation of patient's response to treatment, examination of patient, obtaining history from patient or surrogate,  ordering and performing treatments and interventions, ordering and review of laboratory studies, ordering and review of radiographic studies, pulse oximetry and re-evaluation of patient's condition.   Final Clinical Impressions(s) / ED Diagnoses   Final diagnoses:  Abdominal pain  Calculus of gallbladder without cholecystitis without obstruction  Stage  5 chronic kidney disease not on chronic dialysis ([place])   Admit.   ED Discharge Orders    None        Carlisle Cater, PA-C 12/20/18 1302    Carlisle Cater, PA-C 12/20/18 [place], MD 12/21/18 [phone]

## 2018-12-20 NOTE — ED Triage Notes (Signed)
Per [facility] pt coming from home c/o sudden onset of abdominal pain about 6:30 this am. Patient states she has some acid reflux, took Pepcid and had a bowel movement. describes pain as squeezing. Patient appears very uncomfortable during triage.

## 2018-12-20 NOTE — ED Notes (Signed)
ED TO INPATIENT HANDOFF REPORT  ED Nurse Name and Phone #:  Arby Barrette RN [place] Name/Age/Gender Yvonne Powell 85 y.o. female Room/Bed: 032C/032C  Code Status   Code Status: Full Code  Home/SNF/Other Home Patient oriented to: self, place, time and situation Is this baseline? Yes   Triage Complete: Triage complete  Chief Complaint abd pn  Triage Note Per [facility] pt coming from home c/o sudden onset of abdominal pain about 6:30 this am. Patient states she has some acid reflux, took Pepcid and had a bowel movement. describes pain as squeezing. Patient appears very uncomfortable during triage.    Allergies   Level of Care/Admitting Diagnosis ED Disposition    ED Disposition Condition [facility]: [place] [[id number]]  Level of Care: Med-Surg [16]  Covid Evaluation: Asymptomatic Screening Protocol (No Symptoms)  Diagnosis: Abdominal pain [[id number]]  Admitting Physician: Merton Border [[id number]]  Attending Physician: Laren Everts, ALI Marshal.Browner  Estimated length of stay: past midnight tomorrow  Certification:: I certify this patient will need inpatient services for at least 2 midnights  PT Class (Do Not Modify): Inpatient [101]  PT Acc Code (Do Not Modify): Private [1]       B Medical/Surgery History Past Medical History:  Diagnosis Date  . Anemia    due to kidney  . DVT (deep venous thrombosis) (HCC)    hx  . Hypertension   . Renal disorder   . Thyroid disease    graves dx   Past Surgical History:  Procedure Laterality Date  . AV FISTULA PLACEMENT Right 04/07/2017   Procedure: ARTERIOVENOUS (AV) FISTULA CREATION;  Surgeon: Elam Dutch, MD;  Location: [facility];  Service: Vascular;  Laterality: Right;  . COLONOSCOPY    . DILATION AND CURETTAGE OF UTERUS    . EYE SURGERY     cactaracts  . LIPOMA EXCISION    . LUMBAR EPIDURAL INJECTION  2018     A IV Location/Drains/Wounds Patient Lines/Drains/Airways Status   Active Line/Drains/Airways     Name:   Placement date:   Placement time:   Site:   Days:   Peripheral IV 12/20/18 Left Wrist   12/20/18    0836    Wrist   less than 1   Fistula / Graft Right Upper arm Arteriovenous fistula   04/07/17    0822    Upper arm   622   Incision (Closed) 04/07/17 Arm Right   04/07/17    0823     622          Intake/Output Last 24 hours No intake or output data in the 24 hours ending 12/20/18 1415  Labs/Imaging Results for orders placed or performed during the hospital encounter of 12/20/18 (from the past 48 hour(s))  CBC with Differential     Status: Abnormal   Collection Time: 12/20/18  8:35 AM  Result Value Ref Range   WBC 5.7 4.0 - 10.5 K/uL   RBC 3.14 (L) 3.87 - 5.11 MIL/uL   Hemoglobin 10.0 (L) 12.0 - 15.0 g/dL   HCT 30.7 (L) 36.0 - 46.0 %   MCV 97.8 80.0 - 100.0 fL   MCH 31.8 26.0 - 34.0 pg   MCHC 32.6 30.0 - 36.0 g/dL   RDW 12.7 11.5 - 15.5 %   Platelets 189 150 - 400 K/uL   nRBC 0.0 0.0 - 0.2 %   Neutrophils Relative % 64 %   Neutro Abs 3.7 1.7 - 7.7 K/uL  Lymphocytes Relative 26 %   Lymphs Abs 1.5 0.7 - 4.0 K/uL   Monocytes Relative 7 %   Monocytes Absolute 0.4 0.1 - 1.0 K/uL   Eosinophils Relative 2 %   Eosinophils Absolute 0.1 0.0 - 0.5 K/uL   Basophils Relative 1 %   Basophils Absolute 0.0 0.0 - 0.1 K/uL   Immature Granulocytes 0 %   Abs Immature Granulocytes 0.01 0.00 - 0.07 K/uL    Comment: Performed at [facility], [place] [address] [place], [place] [address]  Comprehensive metabolic panel     Status: Abnormal   Collection Time: 12/20/18  8:35 AM  Result Value Ref Range   Sodium 140 135 - 145 mmol/L   Potassium 4.5 3.5 - 5.1 mmol/L   Chloride 113 (H) 98 - 111 mmol/L   CO2 18 (L) 22 - 32 mmol/L   Glucose, Bld 129 (H) 70 - 99 mg/dL   BUN 65 (H) 8 - 23 mg/dL   Creatinine, Ser 4.29 (H) 0.44 - 1.00 mg/dL   Calcium 9.2 8.9 - 10.3 mg/dL   Total Protein 6.2 (L) 6.5 - 8.1 g/dL   Albumin 3.7 3.5 - 5.0 g/dL   AST 17 15 - 41 U/L   ALT 15 0 - 44 U/L    Alkaline Phosphatase 50 38 - 126 U/L   Total Bilirubin 0.7 0.3 - 1.2 mg/dL   GFR calc non Af Amer 9 (L) >60 mL/min   GFR calc Af Amer 10 (L) >60 mL/min   Anion gap 9 5 - 15    Comment: Performed at [facility], [place] [address], [place], [place] [address]  Lipase, blood     Status: None   Collection Time: 12/20/18  8:35 AM  Result Value Ref Range   Lipase 35 11 - 51 U/L    Comment: Performed at [facility] [address], [place], [place] [address]  Lactic acid, plasma     Status: None   Collection Time: 12/20/18  8:36 AM  Result Value Ref Range   Lactic Acid, Venous 1.3 0.5 - 1.9 mmol/L    Comment: Performed at [facility] [address] [place], [place] [address]  Lactic acid, plasma     Status: None   Collection Time: 12/20/18 10:36 AM  Result Value Ref Range   Lactic Acid, Venous 0.9 0.5 - 1.9 mmol/L    Comment: Performed at [facility] [address], [place], [place] [address]  SARS Coronavirus 2 [facility] order, Performed in [facility] hospital lab) Nasopharyngeal Nasopharyngeal Swab     Status: None   Collection Time: 12/20/18 11:41 AM   Specimen: Nasopharyngeal Swab  Result Value Ref Range   SARS Coronavirus 2 NEGATIVE NEGATIVE    Comment: (NOTE) If result is NEGATIVE SARS-CoV-2 target nucleic acids are NOT DETECTED. The SARS-CoV-2 RNA is generally detectable in upper and lower  respiratory specimens during the acute phase of infection. The lowest  concentration of SARS-CoV-2 viral copies this assay can detect is 250  copies / mL. A negative result does not preclude SARS-CoV-2 infection  and should not be used as the sole basis for treatment or other  patient management decisions.  A negative result may occur with  improper specimen collection / handling, submission of specimen other  than nasopharyngeal swab, presence of viral mutation(s) within the  areas targeted by this assay, and inadequate number of viral copies  (<250 copies / mL).  A  negative result must be combined with clinical  observations, patient history, and epidemiological information. If result is POSITIVE SARS-CoV-2 target nucleic acids are DETECTED. The SARS-CoV-2 RNA is generally detectable in upper and lower  respiratory specimens dur ing the acute phase of infection.  Positive  results are indicative of active infection with SARS-CoV-2.  Clinical  correlation with patient history and other diagnostic information is  necessary to determine patient infection status.  Positive results do  not rule out bacterial infection or co-infection with other viruses. If result is PRESUMPTIVE POSTIVE SARS-CoV-2 nucleic acids MAY BE PRESENT.   A presumptive positive result was obtained on the submitted specimen  and confirmed on repeat testing.  While 2019 novel coronavirus  (SARS-CoV-2) nucleic acids may be present in the submitted sample  additional confirmatory testing may be necessary for epidemiological  and / or clinical management purposes  to differentiate between  SARS-CoV-2 and other Sarbecovirus currently known to infect humans.  If clinically indicated additional testing with an alternate test  methodology [phone]) is advised. The SARS-CoV-2 RNA is generally  detectable in upper and lower respiratory sp ecimens during the acute  phase of infection. The expected result is Negative. Fact Sheet for Patients:  StrictlyIdeas.no Fact Sheet for Healthcare Providers: BankingDealers.co.za This test is not yet approved or cleared by the [place] FDA and has been authorized for detection and/or diagnosis of SARS-CoV-2 by FDA under an Emergency Use Authorization (EUA).  This EUA will remain in effect (meaning this test can be used) for the duration of the COVID-19 declaration under Section 564(b)(1) of the Act, 21 U.S.C. section 360bbb-3(b)(1), unless the authorization is terminated or revoked  sooner. Performed at [facility], [place] [address] [place],  [address]    Ct Abdomen Pelvis Wo Contrast  Result Date: 12/20/2018 CLINICAL DATA:  Abdominal pain starting this morning. Abdominal distention. Chronic renal disease. EXAM: CT ABDOMEN AND PELVIS WITHOUT CONTRAST TECHNIQUE: Multidetector CT imaging of the abdomen and pelvis was performed following the standard protocol without IV contrast. COMPARISON:  None. FINDINGS: Lower chest: Patchy ground-glass opacities with some associated interstitial accentuation in both lower lobes, in the lingula, and in the right middle lobe. Descending thoracic aortic atherosclerotic calcification. Hepatobiliary: Mixed density in the gallbladder fundus with calcified elements, nitrogen gas elements, and surrounding density compatible with either large conglomerate gallstones or gallstones with surrounding sludge. Underlying mass of the gallbladder is less likely. I do not see gallbladder wall thickening in the proximal gallbladder. Noncontrast CT appearance of the liver otherwise unremarkable. Pancreas: Unremarkable Spleen: Unremarkable Adrenals/Urinary Tract: Both adrenal glands appear normal. The noncontrast CT appearance of the right kidney is unremarkable. In the left kidney upper pole, there is a homogeneously dense 3.3 by 2.9 cm partially exophytic lesion with internal density of 61 Hounsfield units, more dense than the adjacent renal parenchyma. Posteriorly in the right kidney lower pole there is a 1.1 cm hypodense lesion on image 28/3, probably a cyst. Centrally along the right kidney lower pole and partially extending into the renal pelvis, a 2.7 by 2.1 cm fluid density lesion is present on image 42/6, probably a parapelvic cyst but not entirely specific. In the right kidney upper pole, adjacent to the homogeneous complex lesion, there is a somewhat more heterogeneous 3.0 by 2.3 cm lesion shown on image 36/6, which appears to probably have a  punctate internal calcification. Stomach/Bowel: There is abnormal stranding in loops of small bowel mesentery in the central and right upper pelvis. Normal  appendix. No dilated bowel. No pneumatosis. Sigmoid colon diverticulosis is present. No extraluminal gas or abscess. Vascular/Lymphatic: Aortoiliac atherosclerotic vascular disease. No pathologic adenopathy. Reproductive: Unremarkable Other: Small localized amount of ascites along the anterior margin of the inferior right hepatic lobe for example images 32-34 of series 3. Musculoskeletal: Left hip effusion. Questionable left iliopsoas bursitis. Grade 1 degenerative retrolisthesis at T12-L1 and grade 1 degenerative anterolisthesis at L3-4 and L4-5. Multilevel Schmorl's nodes in the lumbar spine with spondylosis and degenerative disc disease causing mild multilevel impingement. Degenerative arthropathy of both hips, left greater than right. Mild levoconvex lumbar scoliosis. IMPRESSION: 1. The dominant finding most likely correlating with the patient's symptoms is abnormal mesenteric stranding along several loops of small bowel the central and right upper pelvis. The appearance is nonspecific but could reflect edema associated with inflammation or even ischemia, although there is no appreciable pneumatosis or extraluminal gas at this time, and no dilated bowel. 2. There is a small amount of ascites adjacent to the inferior right hepatic lobe margin. 3. Large conglomerate gallstones filling much of the gallbladder, possibly with surrounding sludge. Strictly speaking, cholecystitis is not readily excluded given the unusual appearance of density around rim of the gallstone. 4. Multiple renal lesions are present. There are 2 complex lesions of the left kidney upper pole, 1 of these is homogeneously dense and probably a complex cyst, but the lesion next to it is somewhat more heterogeneous and could represent a mass/malignancy. I am aware that the patient cannot have IV  contrast at this time due to renal insufficiency. Possibilities for further workup might include pelvic ultrasound to assess for vascularity in either lesion, or follow up surveillance CT or MRI. 5. Patchy ground-glass opacities at both lung bases. Chronicity is not established. Possibilities may include hypersensitivity pneumonitis, atypical pneumonia, pulmonary hemorrhage, or chronic pulmonary embolus. 6. Left hip effusion with questionable left iliopsoas bursitis. Left greater than right degenerative hip arthropathy. 7. Mild multilevel impingement in the lumbar spine due to spondylosis and degenerative disc disease. 8.  Aortic Atherosclerosis (ICD10-I70.0). Electronically Signed   By: Van Clines M.D.   On: 12/20/2018 09:59   Vas [place] Mesenteric  Result Date: 12/20/2018 ABDOMINAL VISCERAL Indications: acute abdomen pain High Risk Factors: Hypertension. Performing Technologist: June Leap RDMS, RVT  Examination Guidelines: A complete evaluation includes B-mode imaging, spectral Doppler, color Doppler, and power Doppler as needed of all accessible portions of each vessel. Bilateral testing is considered an integral part of a complete examination. Limited examinations for reoccurring indications may be performed as noted.  Duplex Findings: +--------------------+--------+--------+------+--------+ Mesenteric          PSV cm/sEDV cm/sPlaqueComments +--------------------+--------+--------+------+--------+ Aorta Prox            158      29                  +--------------------+--------+--------+------+--------+ Aorta Mid              98      13                  +--------------------+--------+--------+------+--------+ Celiac Artery Origin  138      21                  +--------------------+--------+--------+------+--------+ SMA Proximal          201      26                  +--------------------+--------+--------+------+--------+ SMA Mid  143      0                    +--------------------+--------+--------+------+--------+ SMA Distal            189      0                   +--------------------+--------+--------+------+--------+ CHA                   138      22                  +--------------------+--------+--------+------+--------+ Splenic               238      38                  +--------------------+--------+--------+------+--------+ IMA                   100      2                   +--------------------+--------+--------+------+--------+  Summary: Mesenteric: Normal Celiac artery , Superior Mesenteric artery and Inferior Mesenteric artery findings.  *See table(s) above for measurements and observations.  Diagnosing physician: Servando Snare MD  Electronically signed by Servando Snare MD on 12/20/2018 at 2:14:40 PM.    Final    US Abdomen Limited Ruq  Result Date: 12/20/2018 CLINICAL DATA:  RIGHT upper quadrant pain, cholelithiasis by CT EXAM: ULTRASOUND ABDOMEN LIMITED RIGHT UPPER QUADRANT COMPARISON:  CT abdomen and pelvis 12/20/2018 FINDINGS: Gallbladder: Large calcified gallstone 5.6 cm diameter with significant shadowing fills the mid and fundal portions of the gallbladder. Lower gallbladder segment normal appearance. No definite gallbladder wall thickening seen, though assessment at the level of the large calculus is limited. No sonographic Murphy sign or pericholecystic fluid. Common bile duct: Diameter: 5 mm, normal Liver: Normal echogenicity without mass or nodularity. Portal vein is patent on color Doppler imaging with normal direction of blood flow towards the liver. Other: No RIGHT upper quadrant free fluid. IMPRESSION: Large calcified gallstone 5.6 cm diameter filling the mid to fundal portions of the gallbladder with significant shadowing. Remaining gallbladder normal appearance. No biliary dilatation or focal hepatic sonographic abnormalities. Electronically Signed   By: Lavonia Dana M.D.   On: 12/20/2018 11:36    Pending  Labs Unresulted Labs (From admission, onward)    Start     Ordered   12/21/18 4627  Basic metabolic panel  Tomorrow morning,   R     12/20/18 1305   12/20/18 1042  Lactic acid, plasma  Once,   STAT     12/20/18 1041          Vitals/Pain Today's Vitals   12/20/18 1316 12/20/18 1330 12/20/18 1400 12/20/18 1415  BP: (!) 167/86 (!) 162/67 (!) 160/86   Pulse: 79 86 86   Resp: (!) 23 (!) 22 20   Temp:      TempSrc:      SpO2: 98% 99% 98%   Weight:      Height:      PainSc:    5     Isolation Precautions No active isolations  Medications Medications  metroNIDAZOLE (FLAGYL) IVPB 500 mg (500 mg Intravenous Bolus from Bag 12/20/18 1315)  cefTRIAXone (ROCEPHIN) 2 g in sodium chloride 0.9 % 100 mL IVPB (2 g Intravenous Bolus from Bag 12/20/18 1315)  HYDROcodone-acetaminophen (NORCO/VICODIN) 5-325 MG per  tablet 1 tablet (has no administration in time range)  doxazosin (CARDURA) tablet 8 mg (has no administration in time range)  calcitRIOL (ROCALTROL) capsule 0.25 mcg (has no administration in time range)  levothyroxine (SYNTHROID) tablet 88 mcg (has no administration in time range)  famotidine (PEPCID) tablet 20 mg (has no administration in time range)  hydroxypropyl methylcellulose / hypromellose (ISOPTO TEARS / GONIOVISC) 2.5 % ophthalmic solution 1 drop (has no administration in time range)  heparin injection 5,000 Units (has no administration in time range)  ondansetron (ZOFRAN) tablet 4 mg (has no administration in time range)    Or  ondansetron (ZOFRAN) injection 4 mg (has no administration in time range)  diltiazem (CARDIZEM CD) 24 hr capsule 300 mg (has no administration in time range)  morphine 4 MG/ML injection 4 mg (4 mg Intravenous Given 12/20/18 1015)  ondansetron (ZOFRAN) injection 4 mg (4 mg Intravenous Given 12/20/18 0839)  HYDROmorphone (DILAUDID) injection 0.5 mg (0.5 mg Intravenous Given 12/20/18 1306)  sodium chloride 0.9 % bolus 250 mL (250 mLs Intravenous Bolus from  Bag 12/20/18 1309)    Mobility walks Low fall risk   Focused Assessments- -Renal Assessment Handoff:  Stage 5 CKD, Not a dialysis pt at this time.   Restricted appendage: right arm     R Recommendations: See Admitting Provider Note  Report given to: 5M09  Additional Notes: A/Ox4, verbal-able to make needs known, daughter at bedside at this time, very pleasant & cooperative.

## 2018-12-20 NOTE — ED Notes (Signed)
Patient transported to Ultrasound 

## 2018-12-20 NOTE — Progress Notes (Signed)
Mesenteric duplex       has been completed. Preliminary results can be found under CV proc through chart review. June Leap, BS, RDMS, RVT

## 2018-12-20 NOTE — ED Provider Notes (Signed)
Medical screening examination/treatment/procedure(s) were conducted as a shared visit with non-physician practitioner(s) and myself.  I personally evaluated the patient during the encounter.    Patient had sudden onset of severe upper abdominal pain about 6:30 AM.  She denies she is had any vomiting.  She tried Pepcid without much relief.  She reports is expanded to be bandlike pain across her upper abdomen and into her right back.  Denies ever having similar pain.  Patient is alert and appropriate.  She appears to be in pain.  No respiratory distress.  Lungs clear without wheeze rhonchi or rale.  Heart regular no gross rub murmur gallop.  Abdomen is tender in the epigastrium and right upper quadrant.  Lower extremities warm and dry with good peripheral pulses.  No peripheral edema calves are soft and nontender.  Agree with proceeding with pain control and CT scans.  I agree with starting noncontrast due to patient's underlying kidney dysfunction.  I agree with plan of management.   Charlesetta Shanks, MD 12/20/18 [phone]

## 2018-12-20 NOTE — ED Notes (Signed)
Attempted to give report to 56M, the RN was in a contact room, will return call asap.

## 2018-12-20 NOTE — Consult Note (Signed)
[facility]    NEPHROLOGY CONSULTATION NOTE  PATIENT ID:  Yvonne Powell, DOB:  [birth year]  HPI: The patient is a 85 y.o. year old female with PMH CKD Stage V, anemia of chronic disease, hypertension, complex renal cysts who presented to the ED with upper abdominal pain which started yesterday morning, initially relieved with pepcid at home as she felt she had acid reflux. The pain then begin to feel like a band around her abdomen and has not resolved. She continues to have some pain although this has improved with pain medications. Prior to onset of pain she was eating and drinking normally. She has mild nausea but denies previous nausea or vomiting. She denies dysuria, hematruia or other changes in urination and is making urine well. She denies constipation or diarrhea. She denise chest pain, shortness of breath or recent illness.  She sees Dr. Hollie Salk with nephrology and last followed up two weeks ago.    Past Medical History:  Diagnosis Date  . Anemia    due to kidney  . DVT (deep venous thrombosis) (HCC)    hx  . Hypertension   . Renal disorder   . Thyroid disease    graves dx    Past Surgical History:  Procedure Laterality Date  . AV FISTULA PLACEMENT Right 04/07/2017   Procedure: ARTERIOVENOUS (AV) FISTULA CREATION;  Surgeon: Elam Dutch, MD;  Location: [place];  Service: Vascular;  Laterality: Right;  . COLONOSCOPY    . DILATION AND CURETTAGE OF UTERUS    . EYE SURGERY     cactaracts  . LIPOMA EXCISION    . LUMBAR EPIDURAL INJECTION  2018    No family history on file.  Social History   Tobacco Use  . Smoking status: Never Smoker  . Smokeless tobacco: Never Used  Substance Use Topics  . Alcohol use: No  . Drug use: No    REVIEW OF SYSTEMS:  General:  no fatigue, no weakness Head:  no headaches Eyes:  no blurred vision ENT:  no sore throat Neck:  no masses CV:  no chest pain, no orthopnea Lungs:  no shortness of breath, no cough GI:  Mild  nausea, positive abdominal pain, no vomiting, no diarrhea GU:  no dysuria or hematuria Skin:  no rashes or lesions Neuro:  no focal numbness or weakness Psych:  no depression or anxiety    PHYSICAL EXAM:  Vitals:   12/20/18 1500 12/20/18 1530  BP: (!) 155/76 (!) 168/67  Pulse: 80 86  Resp: 17   Temp:    SpO2: 95% 97%   No intake/output data recorded.   General:  AAOx3 NAD HEENT: MMM [place] AT anicteric sclera Neck:  No JVD, no adenopathy CV:  Irregular rhythm, no m/r/g  Lungs:  L/S CTA bilaterally Abd:  Decrease BS, TTP, non-distended, soft GU:  Bladder non-palpable Extremities:  No LE edema. Skin:  No skin rash Psych:  normal mood and affect Neuro:  no focal deficits   CURRENT MEDICATIONS:  . calcitRIOL  0.25 mcg Oral 2 times weekly  . diltiazem  300 mg Oral Daily  . doxazosin  8 mg Oral Daily  . heparin  5,000 Units Subcutaneous Q12H  . [START ON 12/21/2018] levothyroxine  88 mcg Oral QAC breakfast     HOME MEDICATIONS:  Prior to Admission medications   Medication Sig Start Date End Date Taking? Authorizing Provider  calcitRIOL (ROCALTROL) 0.25 MCG capsule Take 0.25 mcg by mouth 2 (two) times a week. Monday,  Friday   Yes [provider]  Cholecalciferol (VITAMIN D) 2000 units tablet Take 2,000 Units by mouth daily.   Yes [provider]  diltiazem (TIAZAC) 300 MG 24 hr capsule Take 300 mg by mouth daily.   Yes [provider]  docusate sodium (COLACE) 100 MG capsule Take 100 mg by mouth daily as needed for moderate constipation.    Yes [provider]  doxazosin (CARDURA) 8 MG tablet Take 8 mg by mouth daily.   Yes [provider]  famotidine (PEPCID) 20 MG tablet Take 20 mg by mouth once.   Yes [provider]  hydroxypropyl methylcellulose / hypromellose (ISOPTO TEARS / GONIOVISC) 2.5 % ophthalmic solution Place 1 drop into both eyes as needed for dry eyes.   Yes [provider]  Iron-FA-DSS-B Cmplx-Vit  C (NEPHRON FA PO) Take 1 tablet by mouth daily.   Yes [provider]  levothyroxine (SYNTHROID, LEVOTHROID) 88 MCG tablet Take 88 mcg by mouth daily before breakfast.   Yes [provider]  OVER THE COUNTER MEDICATION Take 2.5 mLs by mouth daily. Sodium Bicarb powder in 4 0z of H20   Yes [provider]  diphenhydrAMINE (BENADRYL) 25 MG tablet Take 1 tablet (25 mg total) by mouth every 6 (six) hours as needed. Patient not taking: Reported on 12/20/2018 04/12/17   Horton, Barbette Hair, MD  HYDROcodone-acetaminophen (NORCO/VICODIN) 5-325 MG tablet Take 1 tablet by mouth every 4 (four) hours as needed. Patient not taking: Reported on 12/20/2018 07/19/17   Ezequiel Essex, MD  oxyCODONE-acetaminophen (ROXICET) 5-325 MG tablet Take 1 tablet by mouth every 8 (eight) hours as needed for severe pain. Patient not taking: Reported on 12/20/2018 04/07/17   Gabriel Earing, PA-C       LABS:  CBC Latest Ref Rng & Units 12/20/2018 07/19/2017 04/07/2017  WBC 4.0 - 10.5 K/uL 5.7 6.3 -  Hemoglobin 12.0 - 15.0 g/dL 10.0(L) 9.7(L) 9.5(L)  Hematocrit 36.0 - 46.0 % 30.7(L) 28.6(L) 28.0(L)  Platelets 150 - 400 K/uL 189 155 -    CMP Latest Ref Rng & Units 12/20/2018 07/19/2017 04/07/2017  Glucose 70 - 99 mg/dL 129(H) 111(H) 110(H)  BUN 8 - 23 mg/dL 65(H) 42(H) -  Creatinine 0.44 - 1.00 mg/dL 4.29(H) 3.22(H) -  Sodium 135 - 145 mmol/L 140 137 140  Potassium 3.5 - 5.1 mmol/L 4.5 5.2(H) 4.6  Chloride 98 - 111 mmol/L 113(H) 108 -  CO2 22 - 32 mmol/L 18(L) 20(L) -  Calcium 8.9 - 10.3 mg/dL 9.2 9.3 -  Total Protein 6.5 - 8.1 g/dL 6.2(L) - -  Total Bilirubin 0.3 - 1.2 mg/dL 0.7 - -  Alkaline Phos 38 - 126 U/L 50 - -  AST 15 - 41 U/L 17 - -  ALT 0 - 44 U/L 15 - -    Lab Results  Component Value Date   CALCIUM 9.2 12/20/2018   CAION 1.19 11/07/2015    No results found for: COLORURINE, APPEARANCEUR, LABSPEC, PHURINE, GLUCOSEU, HGBUR, BILIRUBINUR, KETONESUR, PROTEINUR, UROBILINOGEN, NITRITE,  LEUKOCYTESUR    Component Value Date/Time   TCO2 23 11/07/2015 0352    No results found for: IRON, TIBC, FERRITIN, IRONPCTSAT     ASSESSMENT/PLAN:      85 y.o. year old female with PMH CKD Stage V, anemia of chronic disease, hypertension, complex renal cysts who presented to the ED with upper abdominal pain which started yesterday morning, diagnosed with possible mesenteric ischemia vs. Acute cholecystitis. Nephrology consulted for AKI on chronic  CKD.   Acute Abdominal Pain Differential includes mesenteric ischemia vs. Acute cholecystitis. HIDA planned for the am. Vascular and general surgery consulted. No indication for acute surgery and admitted to floor for observation. Abx include rocephin and metronidazole per primary.   AKI on CKD Stage V not on HD Presents today with worsening renal function likely secondary to decrease po intake for the last 48 hours. Baseline Cr. ~3.8 at last nephrology clinic visit 7/29, today elevated to 4.3. Functioning AV fistula placed in 2018. She has chronic complex lesions in the upper pole of left kidney which are under chronic observation in the outpatient setting.   - am BMP, Mg - IVF with CLD and decrease po intake - cont calcitriol .25 mcg 2x per week, Nephron FA, sodium bicarb 2.5cc qd and doxazosin 8 mg qd  Hypertension Irregular Heart Rate Home medications include diltiazem 24 hr 300 mg qd. Blood pressure currently elevated, heart rate with irregular rhythm but she has been without home medication, which was given two hours ago. Hypertension likely secondary to current abdominal pain as well.  - cont. dilt 300 mg qd.    Molli Hazard A, DO 12/20/2018, 3:46 PM Pager: [phone]

## 2018-12-20 NOTE — H&P (Signed)
[facility]                                                                                    Patient Demographics  Yvonne Powell, is a 85 y.o. female  CSN: [id number]  MRN: [id number]  DOB - [birth year]  Admit Date - 12/20/2018  Outpatient Primary MD for the patient is Leeroy Cha, MD   With History of -  Past Medical History:  Diagnosis Date  . Anemia    due to kidney  . DVT (deep venous thrombosis) (HCC)    hx  . Hypertension   . Renal disorder   . Thyroid disease    graves dx      Past Surgical History:  Procedure Laterality Date  . AV FISTULA PLACEMENT Right 04/07/2017   Procedure: ARTERIOVENOUS (AV) FISTULA CREATION;  Surgeon: Elam Dutch, MD;  Location: [place];  Service: Vascular;  Laterality: Right;  . COLONOSCOPY    . DILATION AND CURETTAGE OF UTERUS    . EYE SURGERY     cactaracts  . LIPOMA EXCISION    . LUMBAR EPIDURAL INJECTION  2018    in for   Chief Complaint  Patient presents with  . Abdominal Pain     HPI  Yvonne Powell  is a 85 y.o. female, with past medical history significant for hypertension, chronic kidney disease stage V not on hemodialysis yet, history of DVTs and anemia presenting today with 1 day history of generalized abdominal pain that started early in the morning today.  No nausea vomiting or diarrhea, no history of blood in the stools.  No urinary symptoms Patient denies any cough or shortness of breath. Patient has history of advanced renal failure and had an AV fistula placed on the right in 2018. Work-up in the emergency room showed cholelithiasis with no evidence of cholecystitis since CT was done without contrast and some evidence of mesenteric ischemia versus infectious colitis.  Blood work is unremarkable, except for elevated creatinine at 4.29 hemoglobin of 10. General and vascular surgery evaluated patient in the emergency room and wanted to be admitted and observed at this time. Advised HIDA  scan.    Review of Systems    In addition to the HPI above, No Fever-chills, No Headache, No changes with Vision or hearing, No problems swallowing food or Liquids, No Chest pain, Cough or Shortness of Breath,  No Nausea or Vommitting, Bowel movements are regular, No Blood in stool or Urine, No dysuria, No new skin rashes or bruises, No new joints pains-aches,  No new weakness, tingling, numbness in any extremity, No recent weight gain or loss, No polyuria, polydypsia or polyphagia, All other systems were reviewed and were negative.   Social History Social History   Tobacco Use  . Smoking status: Never Smoker  . Smokeless tobacco: Never Used  Substance Use Topics  . Alcohol use: No     Family History No family history on file.   Prior to Admission medications   Medication Sig Start Date End Date Taking? Authorizing Provider  calcitRIOL (ROCALTROL) 0.25 MCG capsule Take 0.25 mcg by mouth 2 (two) times a week. Monday, Friday  Yes [provider]  Cholecalciferol (VITAMIN D) 2000 units tablet Take 2,000 Units by mouth daily.   Yes [provider]  diltiazem (TIAZAC) 300 MG 24 hr capsule Take 300 mg by mouth daily.   Yes [provider]  docusate sodium (COLACE) 100 MG capsule Take 100 mg by mouth daily as needed for moderate constipation.    Yes [provider]  doxazosin (CARDURA) 8 MG tablet Take 8 mg by mouth daily.   Yes [provider]  famotidine (PEPCID) 20 MG tablet Take 20 mg by mouth once.   Yes [provider]  hydroxypropyl methylcellulose / hypromellose (ISOPTO TEARS / GONIOVISC) 2.5 % ophthalmic solution Place 1 drop into both eyes as needed for dry eyes.   Yes [provider]  Iron-FA-DSS-B Cmplx-Vit C (NEPHRON FA PO) Take 1 tablet by mouth daily.   Yes [provider]  levothyroxine (SYNTHROID, LEVOTHROID) 88 MCG tablet Take 88 mcg by mouth daily before breakfast.   Yes [provider]  OVER THE COUNTER MEDICATION Take 2.5 mLs by mouth daily. Sodium Bicarb powder in 4 0z of H20   Yes [provider]  diphenhydrAMINE (BENADRYL) 25 MG tablet Take 1 tablet (25 mg total) by mouth every 6 (six) hours as needed. Patient not taking: Reported on 12/20/2018 04/12/17   Horton, Barbette Hair, MD  HYDROcodone-acetaminophen (NORCO/VICODIN) 5-325 MG tablet Take 1 tablet by mouth every 4 (four) hours as needed. Patient not taking: Reported on 12/20/2018 07/19/17   Ezequiel Essex, MD  oxyCODONE-acetaminophen (ROXICET) 5-325 MG tablet Take 1 tablet by mouth every 8 (eight) hours as needed for severe pain. Patient not taking: Reported on 12/20/2018 04/07/17   Gabriel Earing, PA-C      Physical Exam  Vitals  Blood pressure (!) 121/94, pulse 73, temperature (!) 97.4 F (36.3 C), temperature source Oral, resp. rate 14, height 5\' 3"  (1.6 m), weight 57.6 kg, SpO2 97 %.  GA: Elderly female, looks chronically ill, in pain HEENT no jaundice or pallor, no facial deviation or oral thrush Neck supple, no neck vein distention Chest clear Heart normal S1-S2, no murmurs gallops rubs Abdomen generalized tenderness noted, decreased bowel sounds Extremities no clubbing cyanosis or edema Skin no rashes or ulcers Psych: Patient is in extreme pain Neuro: Nonfocal, patient moving all extremities  Data Review  CBC Recent Labs  Lab 12/20/18 0835  WBC 5.7  HGB 10.0*  HCT 30.7*  PLT 189  MCV 97.8  MCH 31.8  MCHC 32.6  RDW 12.7  LYMPHSABS 1.5  MONOABS 0.4  EOSABS 0.1  BASOSABS 0.0   ------------------------------------------------------------------------------------------------------------------  Chemistries  Recent Labs  Lab 12/20/18 0835  NA 140  K 4.5  CL 113*  CO2 18*  GLUCOSE 129*  BUN 65*  CREATININE 4.29*  CALCIUM 9.2  AST 17  ALT 15  ALKPHOS 50  BILITOT 0.7    ------------------------------------------------------------------------------------------------------------------ estimated creatinine clearance is 7.5 mL/min (A) (by C-G formula based on SCr of 4.29 mg/dL (H)). ------------------------------------------------------------------------------------------------------------------ No results for input(s): TSH, T4TOTAL, T3FREE, THYROIDAB in the last 72 hours.  Invalid input(s): FREET3   Coagulation profile No results for input(s): INR, PROTIME in the last 168 hours. ------------------------------------------------------------------------------------------------------------------- No results for input(s): DDIMER in the last 72 hours. -------------------------------------------------------------------------------------------------------------------  Cardiac Enzymes No results for input(s): CKMB, TROPONINI, MYOGLOBIN in the last 168 hours.  Invalid input(s): CK ------------------------------------------------------------------------------------------------------------------ Invalid input(s): POCBNP   ---------------------------------------------------------------------------------------------------------------  Urinalysis No results found for: COLORURINE, APPEARANCEUR, [place], [place], [place], [place], BILIRUBINUR, KETONESUR,  PROTEINUR, UROBILINOGEN, NITRITE, LEUKOCYTESUR  ----------------------------------------------------------------------------------------------------------------   Imaging results:   Ct Abdomen Pelvis Wo Contrast  Result Date: 12/20/2018 CLINICAL DATA:  Abdominal pain starting this morning. Abdominal distention. Chronic renal disease. EXAM: CT ABDOMEN AND PELVIS WITHOUT CONTRAST TECHNIQUE: Multidetector CT imaging of the abdomen and pelvis was performed following the standard protocol without IV contrast. COMPARISON:  None. FINDINGS: Lower chest: Patchy ground-glass opacities with some associated interstitial  accentuation in both lower lobes, in the lingula, and in the right middle lobe. Descending thoracic aortic atherosclerotic calcification. Hepatobiliary: Mixed density in the gallbladder fundus with calcified elements, nitrogen gas elements, and surrounding density compatible with either large conglomerate gallstones or gallstones with surrounding sludge. Underlying mass of the gallbladder is less likely. I do not see gallbladder wall thickening in the proximal gallbladder. Noncontrast CT appearance of the liver otherwise unremarkable. Pancreas: Unremarkable Spleen: Unremarkable Adrenals/Urinary Tract: Both adrenal glands appear normal. The noncontrast CT appearance of the right kidney is unremarkable. In the left kidney upper pole, there is a homogeneously dense 3.3 by 2.9 cm partially exophytic lesion with internal density of 61 Hounsfield units, more dense than the adjacent renal parenchyma. Posteriorly in the right kidney lower pole there is a 1.1 cm hypodense lesion on image 28/3, probably a cyst. Centrally along the right kidney lower pole and partially extending into the renal pelvis, a 2.7 by 2.1 cm fluid density lesion is present on image 42/6, probably a parapelvic cyst but not entirely specific. In the right kidney upper pole, adjacent to the homogeneous complex lesion, there is a somewhat more heterogeneous 3.0 by 2.3 cm lesion shown on image 36/6, which appears to probably have a punctate internal calcification. Stomach/Bowel: There is abnormal stranding in loops of small bowel mesentery in the central and right upper pelvis. Normal appendix. No dilated bowel. No pneumatosis. Sigmoid colon diverticulosis is present. No extraluminal gas or abscess. Vascular/Lymphatic: Aortoiliac atherosclerotic vascular disease. No pathologic adenopathy. Reproductive: Unremarkable Other: Small localized amount of ascites along the anterior margin of the inferior right hepatic lobe for example images 32-34 of series 3.  Musculoskeletal: Left hip effusion. Questionable left iliopsoas bursitis. Grade 1 degenerative retrolisthesis at T12-L1 and grade 1 degenerative anterolisthesis at L3-4 and L4-5. Multilevel Schmorl's nodes in the lumbar spine with spondylosis and degenerative disc disease causing mild multilevel impingement. Degenerative arthropathy of both hips, left greater than right. Mild levoconvex lumbar scoliosis. IMPRESSION: 1. The dominant finding most likely correlating with the patient's symptoms is abnormal mesenteric stranding along several loops of small bowel the central and right upper pelvis. The appearance is nonspecific but could reflect edema associated with inflammation or even ischemia, although there is no appreciable pneumatosis or extraluminal gas at this time, and no dilated bowel. 2. There is a small amount of ascites adjacent to the inferior right hepatic lobe margin. 3. Large conglomerate gallstones filling much of the gallbladder, possibly with surrounding sludge. Strictly speaking, cholecystitis is not readily excluded given the unusual appearance of density around rim of the gallstone. 4. Multiple renal lesions are present. There are 2 complex lesions of the left kidney upper pole, 1 of these is homogeneously dense and probably a complex cyst, but the lesion next to it is somewhat more heterogeneous and could represent a mass/malignancy. I am aware that the patient cannot have IV contrast at this time due to renal insufficiency. Possibilities for further workup might include pelvic ultrasound to assess for vascularity in either lesion, or follow up surveillance CT or MRI. 5.  Patchy ground-glass opacities at both lung bases. Chronicity is not established. Possibilities may include hypersensitivity pneumonitis, atypical pneumonia, pulmonary hemorrhage, or chronic pulmonary embolus. 6. Left hip effusion with questionable left iliopsoas bursitis. Left greater than right degenerative hip arthropathy. 7.  Mild multilevel impingement in the lumbar spine due to spondylosis and degenerative disc disease. 8.  Aortic Atherosclerosis (ICD10-I70.0). Electronically Signed   By: Van Clines M.D.   On: 12/20/2018 09:59   Vas [place] Mesenteric  Result Date: 12/20/2018 ABDOMINAL VISCERAL Indications: acute abdomen pain High Risk Factors: Hypertension. Performing Technologist: June Leap RDMS, RVT  Examination Guidelines: A complete evaluation includes B-mode imaging, spectral Doppler, color Doppler, and power Doppler as needed of all accessible portions of each vessel. Bilateral testing is considered an integral part of a complete examination. Limited examinations for reoccurring indications may be performed as noted.  Duplex Findings: +--------------------+--------+--------+------+--------+ Mesenteric          PSV cm/sEDV cm/sPlaqueComments +--------------------+--------+--------+------+--------+ Aorta Prox            158      29                  +--------------------+--------+--------+------+--------+ Aorta Mid              98      13                  +--------------------+--------+--------+------+--------+ Celiac Artery Origin  138      21                  +--------------------+--------+--------+------+--------+ SMA Proximal          201      26                  +--------------------+--------+--------+------+--------+ SMA Mid               143      0                   +--------------------+--------+--------+------+--------+ SMA Distal            189      0                   +--------------------+--------+--------+------+--------+ CHA                   138      22                  +--------------------+--------+--------+------+--------+ Splenic               238      38                  +--------------------+--------+--------+------+--------+ IMA                   100      2                   +--------------------+--------+--------+------+--------+  Summary:  Mesenteric: Normal Celiac artery , Superior Mesenteric artery and Inferior Mesenteric artery findings.  *See table(s) above for measurements and observations.     Preliminary    US Abdomen Limited Ruq  Result Date: 12/20/2018 CLINICAL DATA:  RIGHT upper quadrant pain, cholelithiasis by CT EXAM: ULTRASOUND ABDOMEN LIMITED RIGHT UPPER QUADRANT COMPARISON:  CT abdomen and pelvis 12/20/2018 FINDINGS: Gallbladder: Large calcified gallstone 5.6 cm diameter with significant shadowing fills the mid and fundal portions of the  gallbladder. Lower gallbladder segment normal appearance. No definite gallbladder wall thickening seen, though assessment at the level of the large calculus is limited. No sonographic Murphy sign or pericholecystic fluid. Common bile duct: Diameter: 5 mm, normal Liver: Normal echogenicity without mass or nodularity. Portal vein is patent on color Doppler imaging with normal direction of blood flow towards the liver. Other: No RIGHT upper quadrant free fluid. IMPRESSION: Large calcified gallstone 5.6 cm diameter filling the mid to fundal portions of the gallbladder with significant shadowing. Remaining gallbladder normal appearance. No biliary dilatation or focal hepatic sonographic abnormalities. Electronically Signed   By: Lavonia Dana M.D.   On: 12/20/2018 11:36    My personal review of EKG: With him at 47 bpm with old anteroseptal MI  Assessment & Plan  Abdominal pain Cholelithiasis, mesenteric ischemia and colitis are considerations We will admit for hydration and pain control IV antibiotics Rocephin and Flagyl started General vascular surgery consulted HIDA scan will be ordered. Start patient on clear liquid diet  Chronic kidney disease stage V Status post AV fistula in 2018 Nephrology consulted  Hypertension Continue with Tiazac  Hypothyroidism Continue Synthroid   DVT Prophylaxis Heparin  AM Labs Ordered, also please review Full Orders  Family Communication:  Discussed with daughter at bedside.  Code Status full  Disposition Plan: Home  Time spent in minutes : 46 minutes  Condition GUARDED   @SIGNATURE @

## 2018-12-20 NOTE — ED Notes (Signed)
Lunch tray ordered 

## 2018-12-20 NOTE — ED Notes (Addendum)
[facility] called this RN & verified the last time this pt had a narcotic. Due to the frequency of pain meds needed & given the pt will have her hepato-biliary scan done at 0700 in the morning (per nuclear med caller).

## 2018-12-20 NOTE — ED Notes (Signed)
Patient transported to CT 

## 2018-12-20 NOTE — Consult Note (Signed)
[facility] Surgery Consult Note  Yvonne Powell 1929/01/07  [id number].    Requesting MD: Charlesetta Shanks Chief Complaint/Reason for Consult: abdominal pain  HPI:  Yvonne Powell is an 85yo female HTN and CKD with previous right arm AV fistula creation but has never been on dialysis, who presented to [facility] earlier this morning complaining of abdominal pain. States that the pain woke her up from sleep this morning. Never had pain like this before. It started with indigestion and upper abdominal pain bilaterally. Tried taking Pepcid but this did not help. Denies nausea, vomiting, bloating, fever, chills, dysuria, diarrhea, constipation. She had a normal BM this morning. Pain is constant with intermittent sharp/crampy pains. Currently points to epigastric region/RUQ as to where the most severe pain is. She has had nothing to eat/drink today. ED workup included CT abdomen/pelvis wo contrast (due to kidney disease) which showed abnormal mesenteric stranding along several loops of small bowel the central and right upper pelvis nonspecific but could reflect edema associated with inflammation or even ischemia; no appreciable pneumatosis or extraluminal gas, no dilated bowel; small amount of ascites adjacent to the inferior right hepatic lobe margin; Large conglomerate gallstones filling much of the gallbladder; Multiple renal lesions are present; Patchy ground-glass opacities at both lung bases. WBC 5.7. Lactic acid 1.3. Creatinine 4.29. LFTs and lipase WNL. Covid pending. Vascular surgery has been consulted and plans to obtain a mesenteric duplex.  Abdominal surgical history: none Anticoagulants: none Never smoker Lives at home with son  ROS: Review of Systems  Constitutional: Negative.  Negative for chills and fever.  HENT: Negative.   Eyes: Negative.   Respiratory: Negative.   Cardiovascular: Negative.   Gastrointestinal: Positive for abdominal pain. Negative for constipation, diarrhea,  nausea and vomiting.  Genitourinary: Negative.   Musculoskeletal: Negative.   Skin: Negative.   Neurological: Negative.    All systems reviewed and otherwise negative except for as above  No family history on file.  Past Medical History:  Diagnosis Date  . Anemia    due to kidney  . DVT (deep venous thrombosis) (HCC)    hx  . Hypertension   . Renal disorder   . Thyroid disease    graves dx    Past Surgical History:  Procedure Laterality Date  . AV FISTULA PLACEMENT Right 04/07/2017   Procedure: ARTERIOVENOUS (AV) FISTULA CREATION;  Surgeon: Elam Dutch, MD;  Location: [facility];  Service: Vascular;  Laterality: Right;  . COLONOSCOPY    . DILATION AND CURETTAGE OF UTERUS    . EYE SURGERY     cactaracts  . LIPOMA EXCISION    . LUMBAR EPIDURAL INJECTION  2018    Social History:  reports that she has never smoked. She has never used smokeless tobacco. She reports that she does not drink alcohol or use drugs.  Allergies:  Allergies  Allergen Reactions  . Doxycycline Anaphylaxis  . Clindamycin/Lincomycin Diarrhea  . Compazine [Prochlorperazine Edisylate] Other (See Comments)    Makes her want to tear off her skin  . Iodine     All seafood  . Sulfa Antibiotics Hives  . Penicillins Rash        (Not in a hospital admission)   Prior to Admission medications   Medication Sig Start Date End Date Taking? Authorizing Provider  calcitRIOL (ROCALTROL) 0.25 MCG capsule Take 0.25 mcg by mouth 2 (two) times a week. Monday, Friday   Yes [provider]  Cholecalciferol (VITAMIN D) 2000 units tablet Take 2,000 Units by  mouth daily.   Yes [provider]  diltiazem (TIAZAC) 300 MG 24 hr capsule Take 300 mg by mouth daily.   Yes [provider]  docusate sodium (COLACE) 100 MG capsule Take 100 mg by mouth daily as needed for moderate constipation.    Yes [provider]  doxazosin (CARDURA) 8 MG tablet Take 8 mg by mouth daily.   Yes [provider]  famotidine (PEPCID) 20 MG tablet Take 20 mg by mouth once.   Yes [provider]  hydroxypropyl methylcellulose / hypromellose (ISOPTO TEARS / GONIOVISC) 2.5 % ophthalmic solution Place 1 drop into both eyes as needed for dry eyes.   Yes [provider]  Iron-FA-DSS-B Cmplx-Vit C (NEPHRON FA PO) Take 1 tablet by mouth daily.   Yes [provider]  levothyroxine (SYNTHROID, LEVOTHROID) 88 MCG tablet Take 88 mcg by mouth daily before breakfast.   Yes [provider]  OVER THE COUNTER MEDICATION Take 2.5 mLs by mouth daily. Sodium Bicarb powder in 4 0z of H20   Yes [provider]  diphenhydrAMINE (BENADRYL) 25 MG tablet Take 1 tablet (25 mg total) by mouth every 6 (six) hours as needed. Patient not taking: Reported on 12/20/2018 04/12/17   Horton, Barbette Hair, MD  HYDROcodone-acetaminophen (NORCO/VICODIN) 5-325 MG tablet Take 1 tablet by mouth every 4 (four) hours as needed. Patient not taking: Reported on 12/20/2018 07/19/17   Ezequiel Essex, MD  oxyCODONE-acetaminophen (ROXICET) 5-325 MG tablet Take 1 tablet by mouth every 8 (eight) hours as needed for severe pain. Patient not taking: Reported on 12/20/2018 04/07/17   Gabriel Earing, PA-C    Blood pressure 130/60, pulse 67, temperature (!) 97.4 F (36.3 C), temperature source Oral, resp. rate 12, height 5\' 3"  (1.6 m), weight 57.6 kg, SpO2 98 %. Physical Exam: General: pleasant, WD/WN white female who is laying in bed in NAD HEENT: head is normocephalic, atraumatic.  Sclera are noninjected.  Pupils equal and round.  Ears and nose without any masses or lesions.  Mouth is pink and moist. Dentition fair Heart: regular, rate, and rhythm.  No obvious murmurs, gallops, or rubs noted.  Palpable pedal pulses bilaterally Lungs: CTAB, no wheezes, rhonchi, or rales noted.  Respiratory effort nonlabored Abd: soft, nondistended, +BS, no masses, hernias, or organomegaly. Mild generalized tenderness  with more moderate TTP epigastric region and RUQ >LUQ. No rebound or guarding. No peritonitis MS: all 4 extremities are symmetrical with no cyanosis, clubbing, or edema. Skin: warm and dry with no masses, lesions, or rashes Psych: A&Ox3 with an appropriate affect. Neuro: cranial nerves grossly intact, extremity CSM intact bilaterally, normal speech  Results for orders placed or performed during the hospital encounter of 12/20/18 (from the past 48 hour(s))  CBC with Differential     Status: Abnormal   Collection Time: 12/20/18  8:35 AM  Result Value Ref Range   WBC 5.7 4.0 - 10.5 K/uL   RBC 3.14 (L) 3.87 - 5.11 MIL/uL   Hemoglobin 10.0 (L) 12.0 - 15.0 g/dL   HCT 30.7 (L) 36.0 - 46.0 %   MCV 97.8 80.0 - 100.0 fL   MCH 31.8 26.0 - 34.0 pg   MCHC 32.6 30.0 - 36.0 g/dL   RDW 12.7 11.5 - 15.5 %   Platelets 189 150 - 400 K/uL   nRBC 0.0 0.0 - 0.2 %   Neutrophils Relative % 64 %   Neutro Abs 3.7 1.7 - 7.7 K/uL   Lymphocytes Relative 26 %  Lymphs Abs 1.5 0.7 - 4.0 K/uL   Monocytes Relative 7 %   Monocytes Absolute 0.4 0.1 - 1.0 K/uL   Eosinophils Relative 2 %   Eosinophils Absolute 0.1 0.0 - 0.5 K/uL   Basophils Relative 1 %   Basophils Absolute 0.0 0.0 - 0.1 K/uL   Immature Granulocytes 0 %   Abs Immature Granulocytes 0.01 0.00 - 0.07 K/uL    Comment: Performed at [facility], [place] [address], [place], [place] [address]  Comprehensive metabolic panel     Status: Abnormal   Collection Time: 12/20/18  8:35 AM  Result Value Ref Range   Sodium 140 135 - 145 mmol/L   Potassium 4.5 3.5 - 5.1 mmol/L   Chloride 113 (H) 98 - 111 mmol/L   CO2 18 (L) 22 - 32 mmol/L   Glucose, Bld 129 (H) 70 - 99 mg/dL   BUN 65 (H) 8 - 23 mg/dL   Creatinine, Ser 4.29 (H) 0.44 - 1.00 mg/dL   Calcium 9.2 8.9 - 10.3 mg/dL   Total Protein 6.2 (L) 6.5 - 8.1 g/dL   Albumin 3.7 3.5 - 5.0 g/dL   AST 17 15 - 41 U/L   ALT 15 0 - 44 U/L   Alkaline Phosphatase 50 38 - 126 U/L   Total Bilirubin 0.7 0.3 - 1.2  mg/dL   GFR calc non Af Amer 9 (L) >60 mL/min   GFR calc Af Amer 10 (L) >60 mL/min   Anion gap 9 5 - 15    Comment: Performed at [facility], [place] [address] [place], [place] [address]  Lipase, blood     Status: None   Collection Time: 12/20/18  8:35 AM  Result Value Ref Range   Lipase 35 11 - 51 U/L    Comment: Performed at [facility] [address], [place], [place] [address]  Lactic acid, plasma     Status: None   Collection Time: 12/20/18  8:36 AM  Result Value Ref Range   Lactic Acid, Venous 1.3 0.5 - 1.9 mmol/L    Comment: Performed at [facility] [address], [place], [place] [address]   Ct Abdomen Pelvis Wo Contrast  Result Date: 12/20/2018 CLINICAL DATA:  Abdominal pain starting this morning. Abdominal distention. Chronic renal disease. EXAM: CT ABDOMEN AND PELVIS WITHOUT CONTRAST TECHNIQUE: Multidetector CT imaging of the abdomen and pelvis was performed following the standard protocol without IV contrast. COMPARISON:  None. FINDINGS: Lower chest: Patchy ground-glass opacities with some associated interstitial accentuation in both lower lobes, in the lingula, and in the right middle lobe. Descending thoracic aortic atherosclerotic calcification. Hepatobiliary: Mixed density in the gallbladder fundus with calcified elements, nitrogen gas elements, and surrounding density compatible with either large conglomerate gallstones or gallstones with surrounding sludge. Underlying mass of the gallbladder is less likely. I do not see gallbladder wall thickening in the proximal gallbladder. Noncontrast CT appearance of the liver otherwise unremarkable. Pancreas: Unremarkable Spleen: Unremarkable Adrenals/Urinary Tract: Both adrenal glands appear normal. The noncontrast CT appearance of the right kidney is unremarkable. In the left kidney upper pole, there is a homogeneously dense 3.3 by 2.9 cm partially exophytic lesion with internal density of 61 Hounsfield units, more  dense than the adjacent renal parenchyma. Posteriorly in the right kidney lower pole there is a 1.1 cm hypodense lesion on image 28/3, probably a cyst. Centrally along the right kidney lower pole and partially extending into the renal pelvis, a 2.7  by 2.1 cm fluid density lesion is present on image 42/6, probably a parapelvic cyst but not entirely specific. In the right kidney upper pole, adjacent to the homogeneous complex lesion, there is a somewhat more heterogeneous 3.0 by 2.3 cm lesion shown on image 36/6, which appears to probably have a punctate internal calcification. Stomach/Bowel: There is abnormal stranding in loops of small bowel mesentery in the central and right upper pelvis. Normal appendix. No dilated bowel. No pneumatosis. Sigmoid colon diverticulosis is present. No extraluminal gas or abscess. Vascular/Lymphatic: Aortoiliac atherosclerotic vascular disease. No pathologic adenopathy. Reproductive: Unremarkable Other: Small localized amount of ascites along the anterior margin of the inferior right hepatic lobe for example images 32-34 of series 3. Musculoskeletal: Left hip effusion. Questionable left iliopsoas bursitis. Grade 1 degenerative retrolisthesis at T12-L1 and grade 1 degenerative anterolisthesis at L3-4 and L4-5. Multilevel Schmorl's nodes in the lumbar spine with spondylosis and degenerative disc disease causing mild multilevel impingement. Degenerative arthropathy of both hips, left greater than right. Mild levoconvex lumbar scoliosis. IMPRESSION: 1. The dominant finding most likely correlating with the patient's symptoms is abnormal mesenteric stranding along several loops of small bowel the central and right upper pelvis. The appearance is nonspecific but could reflect edema associated with inflammation or even ischemia, although there is no appreciable pneumatosis or extraluminal gas at this time, and no dilated bowel. 2. There is a small amount of ascites adjacent to the inferior  right hepatic lobe margin. 3. Large conglomerate gallstones filling much of the gallbladder, possibly with surrounding sludge. Strictly speaking, cholecystitis is not readily excluded given the unusual appearance of density around rim of the gallstone. 4. Multiple renal lesions are present. There are 2 complex lesions of the left kidney upper pole, 1 of these is homogeneously dense and probably a complex cyst, but the lesion next to it is somewhat more heterogeneous and could represent a mass/malignancy. I am aware that the patient cannot have IV contrast at this time due to renal insufficiency. Possibilities for further workup might include pelvic ultrasound to assess for vascularity in either lesion, or follow up surveillance CT or MRI. 5. Patchy ground-glass opacities at both lung bases. Chronicity is not established. Possibilities may include hypersensitivity pneumonitis, atypical pneumonia, pulmonary hemorrhage, or chronic pulmonary embolus. 6. Left hip effusion with questionable left iliopsoas bursitis. Left greater than right degenerative hip arthropathy. 7. Mild multilevel impingement in the lumbar spine due to spondylosis and degenerative disc disease. 8.  Aortic Atherosclerosis (ICD10-I70.0). Electronically Signed   By: Van Clines M.D.   On: 12/20/2018 09:59      Assessment/Plan HTN AKI on CKD - Cr 4.29. previous right arm AV fistula creation but has never been on dialysis  Upper abdominal pain Gallstones ?Mesenteric ischemia ?Acute cholecystitis - Recommend medical admission. CT scan noncontrast due to kidney disease and therefore more difficult to distinguish what is going on. She does not have peritontis on exam and therefore no role for urgent surgical intervention.  Vascular surgery is seeing and plans to start with mesenteric duplex; if there is any concern may proceed with angiography. She could also have acute cholecystitis as the cause of her pain. Will start with an  abdominal u/s. May need HIDA. Will follow. Covid pending.   Wellington Hampshire, [facility] Surgery 12/20/2018, 11:10 AM Pager: [phone] Mon-Thurs 7:00 am-4:30 pm Fri 7:00 am -11:30 AM Sat-Sun 7:00 am-11:30 am

## 2018-12-20 NOTE — Consult Note (Addendum)
Hospital Consult    Reason for Consult: Abdominal pain with concern for mesenteric ischemia Referring Physician: Alecia Lemming, PA (ED) MRN #:  [id number]  History of Present Illness: This is a 85 y.o. female with history of chronic kidney disease has a previous right arm AV fistula creation but is never been on dialysis.  Denies any previous vascular interventions.  Had abdominal pain started this morning approximately 6 or 7 AM attempted to take antacid did not improve.  States it is across the upper abdomen bilaterally.  Does not radiate.  Has never had this issue before.  Does not smoke.  Does not take blood thinners.  No previous heart history other than hypertension.  Past Medical History:  Diagnosis Date  . Anemia    due to kidney  . DVT (deep venous thrombosis) (HCC)    hx  . Hypertension   . Renal disorder   . Thyroid disease    graves dx    Past Surgical History:  Procedure Laterality Date  . AV FISTULA PLACEMENT Right 04/07/2017   Procedure: ARTERIOVENOUS (AV) FISTULA CREATION;  Surgeon: Elam Dutch, MD;  Location: [facility];  Service: Vascular;  Laterality: Right;  . COLONOSCOPY    . DILATION AND CURETTAGE OF UTERUS    . EYE SURGERY     cactaracts  . LIPOMA EXCISION    . LUMBAR EPIDURAL INJECTION  2018      Prior to Admission medications   Medication Sig Start Date End Date Taking? Authorizing Provider  calcitRIOL (ROCALTROL) 0.25 MCG capsule Take 0.25 mcg by mouth 2 (two) times a week. Monday, Friday   Yes [provider]  Cholecalciferol (VITAMIN D) 2000 units tablet Take 2,000 Units by mouth daily.   Yes [provider]  diltiazem (TIAZAC) 300 MG 24 hr capsule Take 300 mg by mouth daily.   Yes [provider]  docusate sodium (COLACE) 100 MG capsule Take 100 mg by mouth daily as needed for moderate constipation.    Yes [provider]  doxazosin (CARDURA) 8 MG tablet Take 8 mg by mouth daily.   Yes [provider]   famotidine (PEPCID) 20 MG tablet Take 20 mg by mouth once.   Yes [provider]  hydroxypropyl methylcellulose / hypromellose (ISOPTO TEARS / GONIOVISC) 2.5 % ophthalmic solution Place 1 drop into both eyes as needed for dry eyes.   Yes [provider]  Iron-FA-DSS-B Cmplx-Vit C (NEPHRON FA PO) Take 1 tablet by mouth daily.   Yes [provider]  levothyroxine (SYNTHROID, LEVOTHROID) 88 MCG tablet Take 88 mcg by mouth daily before breakfast.   Yes [provider]  OVER THE COUNTER MEDICATION Take 2.5 mLs by mouth daily. Sodium Bicarb powder in 4 0z of H20   Yes [provider]  diphenhydrAMINE (BENADRYL) 25 MG tablet Take 1 tablet (25 mg total) by mouth every 6 (six) hours as needed. Patient not taking: Reported on 12/20/2018 04/12/17   Horton, Barbette Hair, MD  HYDROcodone-acetaminophen (NORCO/VICODIN) 5-325 MG tablet Take 1 tablet by mouth every 4 (four) hours as needed. Patient not taking: Reported on 12/20/2018 07/19/17   Ezequiel Essex, MD  oxyCODONE-acetaminophen (ROXICET) 5-325 MG tablet Take 1 tablet by mouth every 8 (eight) hours as needed for severe pain. Patient not taking: Reported on 12/20/2018 04/07/17   Gabriel Earing, PA-C    Social History   Socioeconomic History  . Marital status: Divorced    Spouse name: Not on file  . Number  of children: Not on file  . Years of education: Not on file  . Highest education level: Not on file  Occupational History  . Not on file  Social Needs  . Financial resource strain: Not on file  . Food insecurity    Worry: Not on file    Inability: Not on file  . Transportation needs    Medical: Not on file    Non-medical: Not on file  Tobacco Use  . Smoking status: Never Smoker  . Smokeless tobacco: Never Used  Substance and Sexual Activity  . Alcohol use: No  . Drug use: No  . Sexual activity: Not on file  Lifestyle  . Physical activity    Days per week: Not on file    Minutes per session:  Not on file  . Stress: Not on file  Relationships  . Social Herbalist on phone: Not on file    Gets together: Not on file    Attends religious service: Not on file    Active member of club or organization: Not on file    Attends meetings of clubs or organizations: Not on file    Relationship status: Not on file  . Intimate partner violence    Fear of current or ex partner: Not on file    Emotionally abused: Not on file    Physically abused: Not on file    Forced sexual activity: Not on file  Other Topics Concern  . Not on file  Social History Narrative  . Not on file     No family history on file.  ROS: Cardiovascular: []  chest pain/pressure []  palpitations []  SOB lying flat []  DOE []  pain in legs while walking []  pain in legs at rest []  pain in legs at night []  non-healing ulcers []  hx of DVT []  swelling in legs  Pulmonary: []  productive cough []  asthma/wheezing []  home O2  Neurologic: []  weakness in []  arms []  legs []  numbness in []  arms []  legs []  hx of CVA []  mini stroke [] difficulty speaking or slurred speech []  temporary loss of vision in one eye []  dizziness  Hematologic: []  hx of cancer []  bleeding problems []  problems with blood clotting easily  Endocrine:   []  diabetes []  thyroid disease  GI [x]  Abdominal pain as above []  blood in stool  GU: [x]  CKD/renal failure []  HD--[]  M/W/F or []  T/T/S []  burning with urination []  blood in urine  Psychiatric: []  anxiety []  depression  Musculoskeletal: []  arthritis []  joint pain  Integumentary: []  rashes []  ulcers  Constitutional: []  fever []  chills   Physical Examination  Vitals:   12/20/18 0930 12/20/18 1008  BP: (!) 153/52 130/60  Pulse: 68 67  Resp: 14 12  Temp:    SpO2: 100% 98%   Body mass index is 22.5 kg/m.  General: No active distress HENT: WNL, normocephalic Pulmonary: normal non-labored breathing Cardiac: Palpable radial and posterior tibial pulses  bilaterally Abdomen: soft, she has mild to moderate tenderness in the upper mid abdomen without any rebound or signs of peritonitis Extremities: Well perfused without clubbing cyanosis or edema  Neurologic: A&O X 3; Appropriate Affect ; SENSATION: normal; MOTOR FUNCTION:  moving all extremities equally. Speech is fluent/normal   CBC    Component Value Date/Time   WBC 5.7 12/20/2018 0835   RBC 3.14 (L) 12/20/2018 0835   HGB 10.0 (L) 12/20/2018 0835   HCT 30.7 (L) 12/20/2018 0835   PLT 189 12/20/2018 0835  MCV 97.8 12/20/2018 0835   MCH 31.8 12/20/2018 0835   MCHC 32.6 12/20/2018 0835   RDW 12.7 12/20/2018 0835   LYMPHSABS 1.5 12/20/2018 0835   MONOABS 0.4 12/20/2018 0835   EOSABS 0.1 12/20/2018 0835   BASOSABS 0.0 12/20/2018 0835    BMET    Component Value Date/Time   NA 140 12/20/2018 0835   K 4.5 12/20/2018 0835   CL 113 (H) 12/20/2018 0835   CO2 18 (L) 12/20/2018 0835   GLUCOSE 129 (H) 12/20/2018 0835   BUN 65 (H) 12/20/2018 0835   CREATININE 4.29 (H) 12/20/2018 0835   CALCIUM 9.2 12/20/2018 0835   GFRNONAA 9 (L) 12/20/2018 0835   GFRAA 10 (L) 12/20/2018 0835    COAGS: No results found for: INR, PROTIME   Non-Invasive Vascular Imaging:   IMPRESSION: 1. The dominant finding most likely correlating with the patient's symptoms is abnormal mesenteric stranding along several loops of small bowel the central and right upper pelvis. The appearance is nonspecific but could reflect edema associated with inflammation or even ischemia, although there is no appreciable pneumatosis or extraluminal gas at this time, and no dilated bowel. 2. There is a small amount of ascites adjacent to the inferior right hepatic lobe margin. 3. Large conglomerate gallstones filling much of the gallbladder, possibly with surrounding sludge. Strictly speaking, cholecystitis is not readily excluded given the unusual appearance of density around rim of the gallstone. 4. Multiple renal  lesions are present. There are 2 complex lesions of the left kidney upper pole, 1 of these is homogeneously dense and probably a complex cyst, but the lesion next to it is somewhat more heterogeneous and could represent a mass/malignancy. I am aware that the patient cannot have IV contrast at this time due to renal insufficiency. Possibilities for further workup might include pelvic ultrasound to assess for vascularity in either lesion, or follow up surveillance CT or MRI. 5. Patchy ground-glass opacities at both lung bases. Chronicity is not established. Possibilities may include hypersensitivity pneumonitis, atypical pneumonia, pulmonary hemorrhage, or chronic pulmonary embolus. 6. Left hip effusion with questionable left iliopsoas bursitis. Left greater than right degenerative hip arthropathy. 7. Mild multilevel impingement in the lumbar spine due to spondylosis and degenerative disc disease. 8.  Aortic Atherosclerosis (ICD10-I70.0).    ASSESSMENT/PLAN: This is a 85 y.o. female here with nonspecific abdominal pain CT scan demonstrating abnormal mesenteric stranding along loops of the small bowel.  CT was without contrast cannot demonstrably rule in or out mesenteric ischemia although she does have significant aortic calcification.  We will start with mesenteric duplex to evaluate arterial flow.  If there is any concern we can proceed with angiography to evaluate her intestinal blood flow.  Differential from a vascular standpoint would also be mesenteric venous thrombosis.  Kielee Care C. Donzetta Matters, MD [facility] Office: [phone] Pager: [phone]   Addendum:  I have independently interpreted the mesenteric duplex which does not demonstrate any flow-limiting stenosis in the celiac artery, SMA, IMA.  Symptoms could be from mesenteric venous thrombosis if there is no other cause of her symptoms could consider heparinization but would avoid CT scan given  chronic kidney disease issues.  I have ordered a liver Doppler to evaluate for flow in the SMV.  Servando Snare

## 2018-12-21 ENCOUNTER — Inpatient Hospital Stay (HOSPITAL_COMMUNITY): Payer: Medicare Other

## 2018-12-21 DIAGNOSIS — R1084 Generalized abdominal pain: Secondary | ICD-10-CM

## 2018-12-21 DIAGNOSIS — N185 Chronic kidney disease, stage 5: Secondary | ICD-10-CM

## 2018-12-21 DIAGNOSIS — N186 End stage renal disease: Secondary | ICD-10-CM

## 2018-12-21 DIAGNOSIS — K8020 Calculus of gallbladder without cholecystitis without obstruction: Secondary | ICD-10-CM

## 2018-12-21 LAB — BASIC METABOLIC PANEL
Anion gap: 8 (ref 5–15)
BUN: 55 mg/dL — ABNORMAL HIGH (ref 8–23)
CO2: 17 mmol/L — ABNORMAL LOW (ref 22–32)
Calcium: 8.7 mg/dL — ABNORMAL LOW (ref 8.9–10.3)
Chloride: 114 mmol/L — ABNORMAL HIGH (ref 98–111)
Creatinine, Ser: 4.06 mg/dL — ABNORMAL HIGH (ref 0.44–1.00)
GFR calc Af Amer: 11 mL/min — ABNORMAL LOW (ref >60)
GFR calc non Af Amer: 9 mL/min — ABNORMAL LOW (ref >60)
Glucose, Bld: 128 mg/dL — ABNORMAL HIGH (ref 70–99)
Potassium: 4.6 mmol/L (ref 3.5–5.1)
Sodium: 139 mmol/L (ref 135–145)

## 2018-12-21 LAB — MAGNESIUM: Magnesium: 2.2 mg/dL (ref 1.7–2.4)

## 2018-12-21 MED ORDER — DIPHENHYDRAMINE HCL 50 MG/ML IJ SOLN
25.0000 mg | Freq: Once | INTRAMUSCULAR | Status: AC
  Administered 2018-12-21: 25 mg via INTRAVENOUS

## 2018-12-21 MED ORDER — SODIUM BICARBONATE 650 MG PO TABS: 1300.0000 mg | ORAL_TABLET | Freq: Two times a day (BID) | ORAL | Status: DC

## 2018-12-21 MED ORDER — SODIUM BICARBONATE 650 MG PO TABS
650.0000 mg | ORAL_TABLET | Freq: Three times a day (TID) | ORAL | Status: DC
  Administered 2018-12-21 – 2018-12-23 (×6): 650 mg via ORAL
  Filled 2018-12-21 (×6): qty 1

## 2018-12-21 MED ORDER — PROMETHAZINE HCL 25 MG/ML IJ SOLN
12.5000 mg | Freq: Once | INTRAMUSCULAR | Status: AC
  Administered 2018-12-21: 12.5 mg via INTRAVENOUS
  Filled 2018-12-21: qty 1

## 2018-12-21 MED ORDER — SODIUM CHLORIDE 0.9 % IV SOLN: 12.5000 mg | Freq: Once | INTRAVENOUS | Status: DC

## 2018-12-21 MED ORDER — DEXTROSE-NACL 5-0.45 % IV SOLN
INTRAVENOUS | Status: AC
  Administered 2018-12-21: 13:00:00 via INTRAVENOUS

## 2018-12-21 MED ORDER — HYDROMORPHONE HCL 1 MG/ML IJ SOLN
0.5000 mg | INTRAMUSCULAR | Status: DC | PRN
  Administered 2018-12-23 – 2018-12-24 (×2): 0.5 mg via INTRAVENOUS
  Filled 2018-12-21 (×2): qty 1

## 2018-12-21 MED ORDER — DIPHENHYDRAMINE HCL 50 MG/ML IJ SOLN
INTRAMUSCULAR | Status: AC
  Filled 2018-12-21: qty 1

## 2018-12-21 MED ORDER — HYDROMORPHONE HCL 1 MG/ML IJ SOLN
1.0000 mg | INTRAMUSCULAR | Status: DC | PRN
  Administered 2018-12-21: 1 mg via INTRAVENOUS
  Filled 2018-12-21: qty 1

## 2018-12-21 MED ORDER — TECHNETIUM TC 99M MEBROFENIN IV KIT
5.0000 | PACK | Freq: Once | INTRAVENOUS | Status: AC | PRN
  Administered 2018-12-21: 5 via INTRAVENOUS

## 2018-12-21 NOTE — Progress Notes (Addendum)
PROGRESS NOTE    Yvonne Powell  WUG:[id number] DOB: 04/08/1929 DOA: 12/20/2018 PCP: Leeroy Cha, MD   Brief Narrative:  Yvonne Powell  is a 85 y.o. female, with past medical history significant for hypertension, chronic kidney disease stage V not on hemodialysis yet, history of DVTs and anemia presenting today with 1 day history of generalized abdominal pain that started early in the morning today.  No nausea vomiting or diarrhea, no history of blood in the stools.  No urinary symptoms. Patient denies any cough or shortness of breath. Patient has history of advanced renal failure and had an AV fistula placed on the right in 2018. Work-up in the emergency room showed cholelithiasis with no evidence of cholecystitis since CT was done without contrast and some evidence of mesenteric ischemia versus infectious colitis.  Blood work is unremarkable, except for elevated creatinine at 4.29 hemoglobin of 10. General and vascular surgery evaluated patient in the emergency room and wanted to be admitted at this time. Advised HIDA scan.   Assessment & Plan:   Principal Problem:   Intractable generalized abdominal pain Active Problems:   CKD (chronic kidney disease) stage 5, GFR less than 15 ml/min (HCC)   Cholelithiasis  Abdominal pain, intractable, POA, ongoing Unclear etiology - rule out colitis, mesenteric ischemia, biliary obstruction -Large gallstone noted on right upper quadrant ultrasound  -Mesenteric duplex unremarkable  -CT unremarkable for overt colitis, obstructive or infectious process  -Right upper quadrant ultrasound remarkable for 5.6 cm gallstone -Transition from morphine to Dilaudid given CKD 5 -IV antibiotics Rocephin and Flagyl ongoing to cover ? Infectious process though less likely given pt without leukocytosis or fever -General surgery, vascular surgery consulted to assist in further evaluation - possible surgery Thursday. Medically cleared from my standpoint.  Nephrology would likely need to weigh in should IV contrast be necessary but it this is less likely given vascular has signed off given unremarkable mesenteric study as above -HIDA scan completed, report still pending  -Initiate patient on clear liquid diet - advance to fulls by dinner if tolerating well.; likely need to be NPO Wed night pending surgical schedule and plan.  Chronic kidney disease stage V, stable Status post AV fistula in 2018 Nephrology consulted D5 half-normal saline ongoing while patient n.p.o. as above- will wean off as PO intake increases Transition from morphine to Dilaudid  Hypertension essential, well-controlled Hold p.o. medications  Hypothyroidism Continue    DVT prophylaxis: Heparin Code Status: Full Disposition Plan: Pending clinical course, improvement in abdominal pain, possible need for further imaging, evaluation and procedure.   Consultants:   Nephrology, surgery  Procedures:   None planned currently  Antimicrobials:  Ceftriaxone, Flagyl 12/20/2018 >> ongoing   Subjective: Patient continues to have abdominal pain/nausea, appears to be bandlike around her mid abdomen and epigastrium radiating towards her back.  Patient indicates her pain medicine currently does improve her pain but does not last long enough.  Pain is 8 out of 10 uncontrolled 4 out of 10 when controlled. Patient declines vomiting, headache, fever, chills.  Objective: Vitals:   12/20/18 1549 12/20/18 2030 12/21/18 0520 12/21/18 1010  BP: (!) 169/70 (!) 160/67 (!) 122/53 (!) 127/43  Pulse: (!) 45  67 (!) 59  Resp: 16 20 20 18   Temp: 97.7 F (36.5 C) 97.9 F (36.6 C) 97.8 F (36.6 C) (!) 97.4 F (36.3 C)  TempSrc: Oral Oral Oral Oral  SpO2: 99% 98% 99% 98%  Weight:      Height:  Intake/Output Summary (Last 24 hours) at 12/21/2018 1234 Last data filed at 12/21/2018 1100 Gross per 24 hour  Intake 1652.3 ml  Output 0 ml  Net 1652.3 ml   Filed Weights    12/20/18 0835  Weight: 57.6 kg    Examination:  General:  Pleasantly resting in bed, No acute distress. HEENT:  Normocephalic atraumatic.  Sclerae nonicteric, noninjected.  Extraocular movements intact bilaterally. Neck:  Without mass or deformity.  Trachea is midline. Lungs:  Clear to auscultate bilaterally without rhonchi, wheeze, or rales. Heart:  Regular rate and rhythm.  Without murmurs, rubs, or gallops. Abdomen:  Soft, mildly tender at the midepigastrium PMI, diffusely tender throughout, nondistended.  Without guarding or rebound. Extremities: Without cyanosis, clubbing, edema, or obvious deformity. Vascular:  Dorsalis pedis and posterior tibial pulses palpable bilaterally. Skin:  Warm and dry, no erythema, no ulcerations.  Data Reviewed: I have personally reviewed following labs and imaging studies  CBC: Recent Labs  Lab 12/20/18 0835  WBC 5.7  NEUTROABS 3.7  HGB 10.0*  HCT 30.7*  MCV 97.8  PLT 295   Basic Metabolic Panel: Recent Labs  Lab 12/20/18 0835 12/21/18 0539  NA 140 139  K 4.5 4.6  CL 113* 114*  CO2 18* 17*  GLUCOSE 129* 128*  BUN 65* 55*  CREATININE 4.29* 4.06*  CALCIUM 9.2 8.7*  MG  --  2.2   GFR: Estimated Creatinine Clearance: 7.9 mL/min (A) (by C-G formula based on SCr of 4.06 mg/dL (H)). Liver Function Tests: Recent Labs  Lab 12/20/18 0835  AST 17  ALT 15  ALKPHOS 50  BILITOT 0.7  PROT 6.2*  ALBUMIN 3.7   Recent Labs  Lab 12/20/18 0835  LIPASE 35   No results for input(s): AMMONIA in the last 168 hours. Coagulation Profile: No results for input(s): INR, PROTIME in the last 168 hours. Cardiac Enzymes: No results for input(s): CKTOTAL, CKMB, CKMBINDEX, TROPONINI in the last 168 hours. BNP (last 3 results) No results for input(s): PROBNP in the last 8760 hours. HbA1C: No results for input(s): HGBA1C in the last 72 hours. CBG: No results for input(s): GLUCAP in the last 168 hours. Lipid Profile: No results for input(s):  CHOL, HDL, LDLCALC, TRIG, CHOLHDL, LDLDIRECT in the last 72 hours. Thyroid Function Tests: No results for input(s): TSH, T4TOTAL, FREET4, T3FREE, THYROIDAB in the last 72 hours. Anemia Panel: No results for input(s): VITAMINB12, FOLATE, FERRITIN, TIBC, IRON, RETICCTPCT in the last 72 hours. Sepsis Labs: Recent Labs  Lab 12/20/18 0836 12/20/18 1036 12/20/18 1706  LATICACIDVEN 1.3 0.9 0.9    Recent Results (from the past 240 hour(s))  SARS Coronavirus 2 [facility] order, Performed in [facility] hospital lab) Nasopharyngeal Nasopharyngeal Swab     Status: None   Collection Time: 12/20/18 11:41 AM   Specimen: Nasopharyngeal Swab  Result Value Ref Range Status   SARS Coronavirus 2 NEGATIVE NEGATIVE Final    Comment: (NOTE) If result is NEGATIVE SARS-CoV-2 target nucleic acids are NOT DETECTED. The SARS-CoV-2 RNA is generally detectable in upper and lower  respiratory specimens during the acute phase of infection. The lowest  concentration of SARS-CoV-2 viral copies this assay can detect is 250  copies / mL. A negative result does not preclude SARS-CoV-2 infection  and should not be used as the sole basis for treatment or other  patient management decisions.  A negative result may occur with  improper specimen collection / handling, submission of specimen other  than nasopharyngeal swab, presence of viral mutation(s)  within the  areas targeted by this assay, and inadequate number of viral copies  (<250 copies / mL). A negative result must be combined with clinical  observations, patient history, and epidemiological information. If result is POSITIVE SARS-CoV-2 target nucleic acids are DETECTED. The SARS-CoV-2 RNA is generally detectable in upper and lower  respiratory specimens dur ing the acute phase of infection.  Positive  results are indicative of active infection with SARS-CoV-2.  Clinical  correlation with patient history and other diagnostic information is  necessary to  determine patient infection status.  Positive results do  not rule out bacterial infection or co-infection with other viruses. If result is PRESUMPTIVE POSTIVE SARS-CoV-2 nucleic acids MAY BE PRESENT.   A presumptive positive result was obtained on the submitted specimen  and confirmed on repeat testing.  While 2019 novel coronavirus  (SARS-CoV-2) nucleic acids may be present in the submitted sample  additional confirmatory testing may be necessary for epidemiological  and / or clinical management purposes  to differentiate between  SARS-CoV-2 and other Sarbecovirus currently known to infect humans.  If clinically indicated additional testing with an alternate test  methodology [id number]) is advised. The SARS-CoV-2 RNA is generally  detectable in upper and lower respiratory sp ecimens during the acute  phase of infection. The expected result is Negative. Fact Sheet for Patients:  StrictlyIdeas.no Fact Sheet for Healthcare Providers: BankingDealers.co.za This test is not yet approved or cleared by the [place] FDA and has been authorized for detection and/or diagnosis of SARS-CoV-2 by FDA under an Emergency Use Authorization (EUA).  This EUA will remain in effect (meaning this test can be used) for the duration of the COVID-19 declaration under Section 564(b)(1) of the Act, 21 U.S.C. section 360bbb-3(b)(1), unless the authorization is terminated or revoked sooner. Performed at [facility], [place] [address] [place], [place] [address]      Radiology Studies: Ct Abdomen Pelvis Wo Contrast  Result Date: 12/20/2018 CLINICAL DATA:  Abdominal pain starting this morning. Abdominal distention. Chronic renal disease. EXAM: CT ABDOMEN AND PELVIS WITHOUT CONTRAST TECHNIQUE: Multidetector CT imaging of the abdomen and pelvis was performed following the standard protocol without IV contrast. COMPARISON:  None. FINDINGS: Lower chest: Patchy  ground-glass opacities with some associated interstitial accentuation in both lower lobes, in the lingula, and in the right middle lobe. Descending thoracic aortic atherosclerotic calcification. Hepatobiliary: Mixed density in the gallbladder fundus with calcified elements, nitrogen gas elements, and surrounding density compatible with either large conglomerate gallstones or gallstones with surrounding sludge. Underlying mass of the gallbladder is less likely. I do not see gallbladder wall thickening in the proximal gallbladder. Noncontrast CT appearance of the liver otherwise unremarkable. Pancreas: Unremarkable Spleen: Unremarkable Adrenals/Urinary Tract: Both adrenal glands appear normal. The noncontrast CT appearance of the right kidney is unremarkable. In the left kidney upper pole, there is a homogeneously dense 3.3 by 2.9 cm partially exophytic lesion with internal density of 61 Hounsfield units, more dense than the adjacent renal parenchyma. Posteriorly in the right kidney lower pole there is a 1.1 cm hypodense lesion on image 28/3, probably a cyst. Centrally along the right kidney lower pole and partially extending into the renal pelvis, a 2.7 by 2.1 cm fluid density lesion is present on image 42/6, probably a parapelvic cyst but not entirely specific. In the right kidney upper pole, adjacent to the homogeneous complex lesion, there is a somewhat more heterogeneous 3.0 by 2.3 cm lesion shown on image 36/6, which appears to probably  have a punctate internal calcification. Stomach/Bowel: There is abnormal stranding in loops of small bowel mesentery in the central and right upper pelvis. Normal appendix. No dilated bowel. No pneumatosis. Sigmoid colon diverticulosis is present. No extraluminal gas or abscess. Vascular/Lymphatic: Aortoiliac atherosclerotic vascular disease. No pathologic adenopathy. Reproductive: Unremarkable Other: Small localized amount of ascites along the anterior margin of the inferior  right hepatic lobe for example images 32-34 of series 3. Musculoskeletal: Left hip effusion. Questionable left iliopsoas bursitis. Grade 1 degenerative retrolisthesis at T12-L1 and grade 1 degenerative anterolisthesis at L3-4 and L4-5. Multilevel Schmorl's nodes in the lumbar spine with spondylosis and degenerative disc disease causing mild multilevel impingement. Degenerative arthropathy of both hips, left greater than right. Mild levoconvex lumbar scoliosis. IMPRESSION: 1. The dominant finding most likely correlating with the patient's symptoms is abnormal mesenteric stranding along several loops of small bowel the central and right upper pelvis. The appearance is nonspecific but could reflect edema associated with inflammation or even ischemia, although there is no appreciable pneumatosis or extraluminal gas at this time, and no dilated bowel. 2. There is a small amount of ascites adjacent to the inferior right hepatic lobe margin. 3. Large conglomerate gallstones filling much of the gallbladder, possibly with surrounding sludge. Strictly speaking, cholecystitis is not readily excluded given the unusual appearance of density around rim of the gallstone. 4. Multiple renal lesions are present. There are 2 complex lesions of the left kidney upper pole, 1 of these is homogeneously dense and probably a complex cyst, but the lesion next to it is somewhat more heterogeneous and could represent a mass/malignancy. I am aware that the patient cannot have IV contrast at this time due to renal insufficiency. Possibilities for further workup might include pelvic ultrasound to assess for vascularity in either lesion, or follow up surveillance CT or MRI. 5. Patchy ground-glass opacities at both lung bases. Chronicity is not established. Possibilities may include hypersensitivity pneumonitis, atypical pneumonia, pulmonary hemorrhage, or chronic pulmonary embolus. 6. Left hip effusion with questionable left iliopsoas bursitis.  Left greater than right degenerative hip arthropathy. 7. Mild multilevel impingement in the lumbar spine due to spondylosis and degenerative disc disease. 8.  Aortic Atherosclerosis (ICD10-I70.0). Electronically Signed   By: Van Clines M.D.   On: 12/20/2018 09:59   Vas [place] Mesenteric  Result Date: 12/20/2018 ABDOMINAL VISCERAL Indications: acute abdomen pain High Risk Factors: Hypertension. Performing Technologist: June Leap RDMS, RVT  Examination Guidelines: A complete evaluation includes B-mode imaging, spectral Doppler, color Doppler, and power Doppler as needed of all accessible portions of each vessel. Bilateral testing is considered an integral part of a complete examination. Limited examinations for reoccurring indications may be performed as noted.  Duplex Findings: +--------------------+--------+--------+------+--------+ Mesenteric          PSV cm/sEDV cm/sPlaqueComments +--------------------+--------+--------+------+--------+ Aorta Prox            158      29                  +--------------------+--------+--------+------+--------+ Aorta Mid              98      13                  +--------------------+--------+--------+------+--------+ Celiac Artery Origin  138      21                  +--------------------+--------+--------+------+--------+ SMA Proximal          201  26                  +--------------------+--------+--------+------+--------+ SMA Mid               143      0                   +--------------------+--------+--------+------+--------+ SMA Distal            189      0                   +--------------------+--------+--------+------+--------+ CHA                   138      22                  +--------------------+--------+--------+------+--------+ Splenic               238      38                  +--------------------+--------+--------+------+--------+ IMA                   100      2                    +--------------------+--------+--------+------+--------+  Summary: Mesenteric: Normal Celiac artery , Superior Mesenteric artery and Inferior Mesenteric artery findings.  *See table(s) above for measurements and observations.  Diagnosing physician: Servando Snare MD  Electronically signed by Servando Snare MD on 12/20/2018 at 2:14:40 PM.    Final    US Abdomen Limited Ruq  Result Date: 12/20/2018 CLINICAL DATA:  RIGHT upper quadrant pain, cholelithiasis by CT EXAM: ULTRASOUND ABDOMEN LIMITED RIGHT UPPER QUADRANT COMPARISON:  CT abdomen and pelvis 12/20/2018 FINDINGS: Gallbladder: Large calcified gallstone 5.6 cm diameter with significant shadowing fills the mid and fundal portions of the gallbladder. Lower gallbladder segment normal appearance. No definite gallbladder wall thickening seen, though assessment at the level of the large calculus is limited. No sonographic Murphy sign or pericholecystic fluid. Common bile duct: Diameter: 5 mm, normal Liver: Normal echogenicity without mass or nodularity. Portal vein is patent on color Doppler imaging with normal direction of blood flow towards the liver. Other: No RIGHT upper quadrant free fluid. IMPRESSION: Large calcified gallstone 5.6 cm diameter filling the mid to fundal portions of the gallbladder with significant shadowing. Remaining gallbladder normal appearance. No biliary dilatation or focal hepatic sonographic abnormalities. Electronically Signed   By: Lavonia Dana M.D.   On: 12/20/2018 11:36   Scheduled Meds: . calcitRIOL  0.25 mcg Oral 2 times weekly  . diltiazem  300 mg Oral Daily  . doxazosin  8 mg Oral Daily  . heparin  5,000 Units Subcutaneous Q12H  . levothyroxine  88 mcg Oral QAC breakfast  . sodium bicarbonate  650 mg Oral TID   Continuous Infusions: . cefTRIAXone (ROCEPHIN)  IV Stopped (12/20/18 1437)  . dextrose 5 % and 0.45% NaCl    . metronidazole 500 mg (12/21/18 0416)     LOS: 1 day    Time spent: 59 min   Little Ishikawa,  DO [facility]  If 7PM-7AM, please contact night-coverage [email or web address] Password [facility] 12/21/2018, 12:34 PM

## 2018-12-21 NOTE — Progress Notes (Addendum)
[facility]    NEPHROLOGY PROGRESS NOTE  SUBJECTIVE:  She continues to have abdominal pain although this is improved from yesterday. Some nausea, no vomiting. She states she is urinating well. Last BM shortly prior to hospital presentation. She states she had a small amount of water to drink last night before being placed NPO. She was unable to complete HIDA due to pain.   OBJECTIVE:  Vitals:   12/20/18 2030 12/21/18 0520  BP: (!) 160/67 (!) 122/53  Pulse:  67  Resp: 20 20  Temp: 97.9 F (36.6 C) 97.8 F (36.6 C)  SpO2: 98% 99%    Intake/Output Summary (Last 24 hours) at 12/21/2018 2229 Last data filed at 12/21/2018 0600 Gross per 24 hour  Intake 1652.3 ml  Output -  Net 1652.3 ml      General:  AAOx3 NAD HEENT: MMM Simpson AT anicteric sclera CV:  Heart RRR, no m/r/g  Lungs:  L/S CTA bilaterally Abd:  abd soft, non-distended, +BS, TTP periumbilical region GU:  Bladder non-palpable Extremities:  No LE edema Skin:  C/d/i, no rash  MEDICATIONS:  . calcitRIOL  0.25 mcg Oral 2 times weekly  . diltiazem  300 mg Oral Daily  . doxazosin  8 mg Oral Daily  . heparin  5,000 Units Subcutaneous Q12H  . levothyroxine  88 mcg Oral QAC breakfast       LABS:   CBC Latest Ref Rng & Units 12/20/2018 07/19/2017 04/07/2017  WBC 4.0 - 10.5 K/uL 5.7 6.3 -  Hemoglobin 12.0 - 15.0 g/dL 10.0(L) 9.7(L) 9.5(L)  Hematocrit 36.0 - 46.0 % 30.7(L) 28.6(L) 28.0(L)  Platelets 150 - 400 K/uL 189 155 -    CMP Latest Ref Rng & Units 12/21/2018 12/20/2018 07/19/2017  Glucose 70 - 99 mg/dL 128(H) 129(H) 111(H)  BUN 8 - 23 mg/dL 55(H) 65(H) 42(H)  Creatinine 0.44 - 1.00 mg/dL 4.06(H) 4.29(H) 3.22(H)  Sodium 135 - 145 mmol/L 139 140 137  Potassium 3.5 - 5.1 mmol/L 4.6 4.5 5.2(H)  Chloride 98 - 111 mmol/L 114(H) 113(H) 108  CO2 22 - 32 mmol/L 17(L) 18(L) 20(L)  Calcium 8.9 - 10.3 mg/dL 8.7(L) 9.2 9.3  Total Protein 6.5 - 8.1 g/dL - 6.2(L) -  Total Bilirubin 0.3 - 1.2 mg/dL - 0.7 -   Alkaline Phos 38 - 126 U/L - 50 -  AST 15 - 41 U/L - 17 -  ALT 0 - 44 U/L - 15 -    Lab Results  Component Value Date   CALCIUM 8.7 (L) 12/21/2018   CAION 1.19 11/07/2015    No results found for: COLORURINE, APPEARANCEUR, LABSPEC, PHURINE, GLUCOSEU, HGBUR, BILIRUBINUR, KETONESUR, PROTEINUR, UROBILINOGEN, NITRITE, LEUKOCYTESUR    Component Value Date/Time   TCO2 23 11/07/2015 0352    No results found for: IRON, TIBC, FERRITIN, IRONPCTSAT     ASSESSMENT/PLAN:     85 y.o. year old female with PMH CKD Stage V, anemia of chronic disease, hypertension, complex renal cysts who presented to the ED with upper abdominal pain which started yesterday morning, diagnosed with possible mesenteric ischemia vs. Acute cholecystitis. Nephrology consulted for AKI on chronic CKD.   Acute Abdominal Pain Differential includes mesenteric ischemia vs. Acute cholecystitis. Unable to complete full HIDA due to pain, results pending. Liver US planned today. Vascular and general surgery consulted. No indication for acute surgery and admitted to floor for observation. Abx include rocephin and metronidazole per primary.   AKI on CKD Stage V not on HD Non-gapped Metabolic Acidosis Baseline  Cr. ~3.8 at last nephrology clinic visit 7/29, presented with Cr 4.3, which continues to be stable with gentle maint fluids, 4 this morning. Likely 2/2 to decreased po intake with abdominal pain. Functioning AV fistula placed in 2018. She has chronic complex lesions in the upper pole of left kidney which are under chronic observation with [facility].   - am RFP - increased D5 1/2NS to 100cc with NPO status  - cont calcitriol .25 mcg 2x per week, Nephron FA, and doxazosin 8 mg qd - restart sodium bicarb 2.5cc qd  Hypertension Irregular Heart Rate - resolved Home medications include diltiazem 24 hr 300 mg qd. Blood pressure improved today.  - cont. dilt 300 mg qd.    Molli Hazard A, DO 12/21/2018, 7:26  AM Pager: [phone]

## 2018-12-21 NOTE — Progress Notes (Signed)
  Progress Note    12/21/2018 3:24 PM * No surgery found *  Subjective: Has dull mid abdominal pain, just received Dilaudid prior to my evaluation  Vitals:   12/21/18 0520 12/21/18 1010  BP: (!) 122/53 (!) 127/43  Pulse: 67 (!) 59  Resp: 20 18  Temp: 97.8 F (36.6 C) (!) 97.4 F (36.3 C)  SpO2: 99% 98%    Physical Exam: Awake and alert Nonlabored respirations Abdomen is soft there is tenderness in the mid abdomen Palpable pedal pulses bilaterally  CBC    Component Value Date/Time   WBC 5.7 12/20/2018 0835   RBC 3.14 (L) 12/20/2018 0835   HGB 10.0 (L) 12/20/2018 0835   HCT 30.7 (L) 12/20/2018 0835   PLT 189 12/20/2018 0835   MCV 97.8 12/20/2018 0835   MCH 31.8 12/20/2018 0835   MCHC 32.6 12/20/2018 0835   RDW 12.7 12/20/2018 0835   LYMPHSABS 1.5 12/20/2018 0835   MONOABS 0.4 12/20/2018 0835   EOSABS 0.1 12/20/2018 0835   BASOSABS 0.0 12/20/2018 0835    BMET    Component Value Date/Time   NA 139 12/21/2018 0539   K 4.6 12/21/2018 0539   CL 114 (H) 12/21/2018 0539   CO2 17 (L) 12/21/2018 0539   GLUCOSE 128 (H) 12/21/2018 0539   BUN 55 (H) 12/21/2018 0539   CREATININE 4.06 (H) 12/21/2018 0539   CALCIUM 8.7 (L) 12/21/2018 0539   GFRNONAA 9 (L) 12/21/2018 0539   GFRAA 11 (L) 12/21/2018 0539    INR No results found for: INR   Intake/Output Summary (Last 24 hours) at 12/21/2018 1524 Last data filed at 12/21/2018 1100 Gross per 24 hour  Intake 802.3 ml  Output 0 ml  Net 802.3 ml     Assessment/plan:  85 y.o. female is here with abdominal pain with concern for mesenteric ischemia.  Arterial duplex negative for any arterial insufficiency and liver Doppler suggests flow within the portal and splenic veins making mesenteric venous thrombosis much less likely.  There is no role for vascular intervention she can follow-up on an as-needed basis.   [place] C. Donzetta Matters, MD [facility] [place] Office: [phone] Pager: [phone]   12/21/2018 3:24 PM

## 2018-12-21 NOTE — Progress Notes (Signed)
Pt continued to c/o nausea after administering the zofran. Notified MD Avon Gully and phenergan was ordered. Administered phenergan and pt stated that she felt her "throat close up" and that it was hard to swallow salvia. MD at the bedside gave verbal order for Benadryl 25 mg IV ONCE. Administered Benadryl and pt continued to c/o throat closing. Stayed in the room with patient and daughter. Pt is resting comfortably now. Denies any pain, or throat swelling.    Paulla Fore, RN

## 2018-12-21 NOTE — Progress Notes (Addendum)
    CC:  Abdominal pain  Subjective: Back from HIDA, having pain and recently got phenergan and morphine.  Still tender in the RUQ.  Some nausea and better with phenergan.  Objective: Vital signs in last 24 hours: Temp:  [97.4 F (36.3 C)-97.9 F (36.6 C)] 97.4 F (36.3 C) (08/18 1010) Pulse Rate:  [45-88] 59 (08/18 1010) Resp:  [14-27] 18 (08/18 1010) BP: (121-169)/(22-94) 127/43 (08/18 1010) SpO2:  [93 %-99 %] 98 % (08/18 1010) Last BM Date: 12/19/18  NPO so far 1652 IV Urine x 4 Afebrile, VSS WBC 5.7 12/20/18 BMP stable RUQ [place] 8/17: 5.6 cm gallbladder stone, otherwise normal GB; CBD 5 mm CT scan 8/17:  SB edema associated with inflammation or ischemia; no pneumatosis or extraluminal gas at this time, and no dilated bowel. No cholecystitis, large conglomerate gallstones/sludge.  Multiple renal lesions, upper pole left kidney, Patchy ground-glass opacities at both lung bases.  Left hip effusion with questionable left iliopsoas bursitis. Left greater than right degenerative hip arthropathy.   Mild multilevel impingement in the lumbar spine due to spondylosis and degenerative disc disease.  Aortic Atherosclerosis  HIDA 8/18:Normal hepatocellular function.  Patent cystic duct and common bile duct. No scintigraphic evidence of acute cholecystitis   Intake/Output from previous day: 08/17 0701 - 08/18 0700 In: 1652.3 [I.V.:702.3; IV Piggyback:950] Out: -  Intake/Output this shift: No intake/output data recorded.  General appearance: alert, cooperative and no distress Resp: clear to auscultation bilaterally GI: soft, but tender/pain in RUQ.  Some nausea, no vomiting.  Lab Results:  Recent Labs    12/20/18 0835  WBC 5.7  HGB 10.0*  HCT 30.7*  PLT 189    BMET Recent Labs    12/20/18 0835 12/21/18 0539  NA 140 139  K 4.5 4.6  CL 113* 114*  CO2 18* 17*  GLUCOSE 129* 128*  BUN 65* 55*  CREATININE 4.29* 4.06*  CALCIUM 9.2 8.7*   PT/INR No results for  input(s): LABPROT, INR in the last 72 hours.  Recent Labs  Lab 12/20/18 0835  AST 17  ALT 15  ALKPHOS 50  BILITOT 0.7  PROT 6.2*  ALBUMIN 3.7     Lipase     Component Value Date/Time   LIPASE 35 12/20/2018 0835     Medications: . calcitRIOL  0.25 mcg Oral 2 times weekly  . diltiazem  300 mg Oral Daily  . doxazosin  8 mg Oral Daily  . heparin  5,000 Units Subcutaneous Q12H  . levothyroxine  88 mcg Oral QAC breakfast   . cefTRIAXone (ROCEPHIN)  IV Stopped (12/20/18 1437)  . dextrose 5 % and 0.45% NaCl    . metronidazole 500 mg (12/21/18 0416)    Assessment/Plan Hypertension AKI  Not on HD - creatinine 4.29 COVID negative(8/17)  Abdominal pain   - Cholelithiasis/possible cholecystitis - HIDA negative  - 5.6 cm gallbladder stone, otherwise normal GB; CBD 5 mm  - Possible mesenteric ischemia   - Arterial duplex negative and liver doppler shows flow within portal and splenic        veins  FEN: Iv fluids/clear liquids ID:  Rocephin 8/17  >> Day 2 DVT:  SCD's added Follow up:  TBD  Plan:  Review with Dr. Brantley Stage, discuss possible surgery, if cleared by Medicine.  If cleared will aim for OR Thursday.     LOS: 1 day    Yvonne Powell 12/21/2018 [phone]

## 2018-12-22 ENCOUNTER — Inpatient Hospital Stay (HOSPITAL_COMMUNITY): Payer: Medicare Other

## 2018-12-22 DIAGNOSIS — I10 Essential (primary) hypertension: Secondary | ICD-10-CM

## 2018-12-22 DIAGNOSIS — E039 Hypothyroidism, unspecified: Secondary | ICD-10-CM

## 2018-12-22 LAB — RENAL FUNCTION PANEL
Albumin: 3 g/dL — ABNORMAL LOW (ref 3.5–5.0)
Anion gap: 7 (ref 5–15)
BUN: 56 mg/dL — ABNORMAL HIGH (ref 8–23)
CO2: 19 mmol/L — ABNORMAL LOW (ref 22–32)
Calcium: 8.5 mg/dL — ABNORMAL LOW (ref 8.9–10.3)
Chloride: 111 mmol/L (ref 98–111)
Creatinine, Ser: 4.9 mg/dL — ABNORMAL HIGH (ref 0.44–1.00)
GFR calc Af Amer: 9 mL/min — ABNORMAL LOW (ref >60)
GFR calc non Af Amer: 7 mL/min — ABNORMAL LOW (ref >60)
Glucose, Bld: 117 mg/dL — ABNORMAL HIGH (ref 70–99)
Phosphorus: 5.8 mg/dL — ABNORMAL HIGH (ref 2.5–4.6)
Potassium: 4.6 mmol/L (ref 3.5–5.1)
Sodium: 137 mmol/L (ref 135–145)

## 2018-12-22 LAB — CBC
HCT: 29.7 % — ABNORMAL LOW (ref 36.0–46.0)
Hemoglobin: 9.6 g/dL — ABNORMAL LOW (ref 12.0–15.0)
MCH: 32.1 pg (ref 26.0–34.0)
MCHC: 32.3 g/dL (ref 30.0–36.0)
MCV: 99.3 fL (ref 80.0–100.0)
Platelets: 176 10*3/uL (ref 150–400)
RBC: 2.99 MIL/uL — ABNORMAL LOW (ref 3.87–5.11)
RDW: 13.2 % (ref 11.5–15.5)
WBC: 10.2 10*3/uL (ref 4.0–10.5)
nRBC: 0 % (ref 0.0–0.2)

## 2018-12-22 MED ORDER — HYDRALAZINE HCL 20 MG/ML IJ SOLN
5.0000 mg | Freq: Four times a day (QID) | INTRAMUSCULAR | Status: DC | PRN
  Administered 2018-12-22 – 2018-12-25 (×2): 5 mg via INTRAVENOUS
  Filled 2018-12-22 (×3): qty 1

## 2018-12-22 MED ORDER — CHLORHEXIDINE GLUCONATE CLOTH 2 % EX PADS
6.0000 | MEDICATED_PAD | Freq: Once | CUTANEOUS | Status: AC
  Administered 2018-12-22: 6 via TOPICAL

## 2018-12-22 MED ORDER — CIPROFLOXACIN IN D5W 400 MG/200ML IV SOLN: 400.0000 mg | INTRAVENOUS | Status: DC

## 2018-12-22 MED ORDER — CHLORHEXIDINE GLUCONATE CLOTH 2 % EX PADS
6.0000 | MEDICATED_PAD | Freq: Once | CUTANEOUS | Status: AC
  Administered 2018-12-23: 6 via TOPICAL

## 2018-12-22 MED ORDER — SODIUM CHLORIDE 0.9 % IV SOLN
INTRAVENOUS | Status: AC
  Administered 2018-12-22: 15:00:00 via INTRAVENOUS

## 2018-12-22 MED ORDER — DOCUSATE SODIUM 100 MG PO CAPS
100.0000 mg | ORAL_CAPSULE | Freq: Two times a day (BID) | ORAL | Status: DC
  Administered 2018-12-22 – 2018-12-25 (×5): 100 mg via ORAL
  Filled 2018-12-22 (×13): qty 1

## 2018-12-22 MED ORDER — HEPARIN SODIUM (PORCINE) 5000 UNIT/ML IJ SOLN
5000.0000 [IU] | Freq: Three times a day (TID) | INTRAMUSCULAR | Status: DC
  Administered 2018-12-22 – 2018-12-28 (×18): 5000 [IU] via SUBCUTANEOUS
  Filled 2018-12-22 (×19): qty 1

## 2018-12-22 MED ORDER — ACETAMINOPHEN 500 MG PO TABS: 1000.0000 mg | ORAL_TABLET | ORAL | Status: DC

## 2018-12-22 MED ORDER — SODIUM CHLORIDE 0.9 % IV SOLN
INTRAVENOUS | Status: DC | PRN
  Administered 2018-12-22: 250 mL via INTRAVENOUS
  Administered 2018-12-23: 07:00:00 via INTRAVENOUS
  Administered 2018-12-24: 250 mL via INTRAVENOUS

## 2018-12-22 MED ORDER — GABAPENTIN 300 MG PO CAPS: 300.0000 mg | ORAL_CAPSULE | ORAL | Status: DC

## 2018-12-22 NOTE — Plan of Care (Signed)
  Problem: Education: Goal: Knowledge of General Education information will improve Description: Including pain rating scale, medication(s)/side effects and non-pharmacologic comfort measures Outcome: Progressing   Problem: Safety: Goal: Ability to remain free from injury will improve Outcome: Progressing   

## 2018-12-22 NOTE — Progress Notes (Addendum)
[facility] Progress Note     Subjective: CC-  Much more awake and alert today. States that the morphine/phenergan helped her pain and nausea yesterday but it knocked her out. Continues to have upper abdominal pain and intermittent nausea. No emesis. Tolerating few liquids. Feels bloated. No BM since admission.  About to head down for an abdominal film.  Objective: Vital signs in last 24 hours: Temp:  [97.4 F (36.3 C)-98.5 F (36.9 C)] 98.2 F (36.8 C) (08/19 0659) Pulse Rate:  [59-64] 60 (08/19 0659) Resp:  [18-20] 19 (08/19 0659) BP: (127-134)/(43-58) 131/52 (08/19 0659) SpO2:  [94 %-99 %] 99 % (08/19 0659) Weight:  [57.5 kg] 57.5 kg (08/18 2118) Last BM Date: 12/19/18  Intake/Output from previous day: 08/18 0701 - 08/19 0700 In: 693.5 [P.O.:300; I.V.:193.5; IV Piggyback:200] Out: 50 [Urine:50] Intake/Output this shift: Total I/O In: 100 [P.O.:100] Out: 200 [Urine:200]  PE: Gen:  Alert, NAD, pleasant HEENT: EOM's intact, pupils equal and round Pulm:  Rate and effort normal Abd: Soft, mild distension, +BS, tender RUQ/epigastric region without rebound or guarding Skin: no rashes noted, warm and dry  Lab Results:  Recent Labs    12/20/18 0835 12/22/18 0630  WBC 5.7 10.2  HGB 10.0* 9.6*  HCT 30.7* 29.7*  PLT 189 176   BMET Recent Labs    12/21/18 0539 12/22/18 0630  NA 139 137  K 4.6 4.6  CL 114* 111  CO2 17* 19*  GLUCOSE 128* 117*  BUN 55* 56*  CREATININE 4.06* 4.90*  CALCIUM 8.7* 8.5*   PT/INR No results for input(s): LABPROT, INR in the last 72 hours. CMP     Component Value Date/Time   NA 137 12/22/2018 0630   K 4.6 12/22/2018 0630   CL 111 12/22/2018 0630   CO2 19 (L) 12/22/2018 0630   GLUCOSE 117 (H) 12/22/2018 0630   BUN 56 (H) 12/22/2018 0630   CREATININE 4.90 (H) 12/22/2018 0630   CALCIUM 8.5 (L) 12/22/2018 0630   PROT 6.2 (L) 12/20/2018 0835   ALBUMIN 3.0 (L) 12/22/2018 0630   AST 17 12/20/2018 0835   ALT 15  12/20/2018 0835   ALKPHOS 50 12/20/2018 0835   BILITOT 0.7 12/20/2018 0835   GFRNONAA 7 (L) 12/22/2018 0630   GFRAA 9 (L) 12/22/2018 0630   Lipase     Component Value Date/Time   LIPASE 35 12/20/2018 0835       Studies/Results: Ct Abdomen Pelvis Wo Contrast  Result Date: 12/20/2018 CLINICAL DATA:  Abdominal pain starting this morning. Abdominal distention. Chronic renal disease. EXAM: CT ABDOMEN AND PELVIS WITHOUT CONTRAST TECHNIQUE: Multidetector CT imaging of the abdomen and pelvis was performed following the standard protocol without IV contrast. COMPARISON:  None. FINDINGS: Lower chest: Patchy ground-glass opacities with some associated interstitial accentuation in both lower lobes, in the lingula, and in the right middle lobe. Descending thoracic aortic atherosclerotic calcification. Hepatobiliary: Mixed density in the gallbladder fundus with calcified elements, nitrogen gas elements, and surrounding density compatible with either large conglomerate gallstones or gallstones with surrounding sludge. Underlying mass of the gallbladder is less likely. I do not see gallbladder wall thickening in the proximal gallbladder. Noncontrast CT appearance of the liver otherwise unremarkable. Pancreas: Unremarkable Spleen: Unremarkable Adrenals/Urinary Tract: Both adrenal glands appear normal. The noncontrast CT appearance of the right kidney is unremarkable. In the left kidney upper pole, there is a homogeneously dense 3.3 by 2.9 cm partially exophytic lesion with internal density of 61 Hounsfield units, more dense than the  adjacent renal parenchyma. Posteriorly in the right kidney lower pole there is a 1.1 cm hypodense lesion on image 28/3, probably a cyst. Centrally along the right kidney lower pole and partially extending into the renal pelvis, a 2.7 by 2.1 cm fluid density lesion is present on image 42/6, probably a parapelvic cyst but not entirely specific. In the right kidney upper pole, adjacent to  the homogeneous complex lesion, there is a somewhat more heterogeneous 3.0 by 2.3 cm lesion shown on image 36/6, which appears to probably have a punctate internal calcification. Stomach/Bowel: There is abnormal stranding in loops of small bowel mesentery in the central and right upper pelvis. Normal appendix. No dilated bowel. No pneumatosis. Sigmoid colon diverticulosis is present. No extraluminal gas or abscess. Vascular/Lymphatic: Aortoiliac atherosclerotic vascular disease. No pathologic adenopathy. Reproductive: Unremarkable Other: Small localized amount of ascites along the anterior margin of the inferior right hepatic lobe for example images 32-34 of series 3. Musculoskeletal: Left hip effusion. Questionable left iliopsoas bursitis. Grade 1 degenerative retrolisthesis at T12-L1 and grade 1 degenerative anterolisthesis at L3-4 and L4-5. Multilevel Schmorl's nodes in the lumbar spine with spondylosis and degenerative disc disease causing mild multilevel impingement. Degenerative arthropathy of both hips, left greater than right. Mild levoconvex lumbar scoliosis. IMPRESSION: 1. The dominant finding most likely correlating with the patient's symptoms is abnormal mesenteric stranding along several loops of small bowel the central and right upper pelvis. The appearance is nonspecific but could reflect edema associated with inflammation or even ischemia, although there is no appreciable pneumatosis or extraluminal gas at this time, and no dilated bowel. 2. There is a small amount of ascites adjacent to the inferior right hepatic lobe margin. 3. Large conglomerate gallstones filling much of the gallbladder, possibly with surrounding sludge. Strictly speaking, cholecystitis is not readily excluded given the unusual appearance of density around rim of the gallstone. 4. Multiple renal lesions are present. There are 2 complex lesions of the left kidney upper pole, 1 of these is homogeneously dense and probably a complex  cyst, but the lesion next to it is somewhat more heterogeneous and could represent a mass/malignancy. I am aware that the patient cannot have IV contrast at this time due to renal insufficiency. Possibilities for further workup might include pelvic ultrasound to assess for vascularity in either lesion, or follow up surveillance CT or MRI. 5. Patchy ground-glass opacities at both lung bases. Chronicity is not established. Possibilities may include hypersensitivity pneumonitis, atypical pneumonia, pulmonary hemorrhage, or chronic pulmonary embolus. 6. Left hip effusion with questionable left iliopsoas bursitis. Left greater than right degenerative hip arthropathy. 7. Mild multilevel impingement in the lumbar spine due to spondylosis and degenerative disc disease. 8.  Aortic Atherosclerosis (ICD10-I70.0). Electronically Signed   By: Van Clines M.D.   On: 12/20/2018 09:59   Nm Hepatobiliary Liver Func  Result Date: 12/21/2018 CLINICAL DATA:  Severe upper abdominal pain and bloating, cholelithiasis by CT EXAM: NUCLEAR MEDICINE HEPATOBILIARY IMAGING TECHNIQUE: Sequential images of the abdomen were obtained out to 60 minutes following intravenous administration of radiopharmaceutical. Patient was unable to perform assessment of gallbladder emptying following fatty meal stimulation due to severe pain and inability to drink ensure. RADIOPHARMACEUTICALS:  5.2 mCi Tc-44m  Choletec IV COMPARISON:  None Correlation: CT abdomen and pelvis 12/20/2018 FINDINGS: Prompt tracer clearance from bloodstream indicating normal hepatocellular function. Prompt excretion of tracer into biliary tree. Small bowel visualized at 28 minutes. Beginning at 36 minutes, tracer is visualized within the gallbladder. No hepatic retention of tracer.  IMPRESSION: Normal hepatocellular function. Patent cystic duct and common bile duct. No scintigraphic evidence of acute cholecystitis. Electronically Signed   By: Lavonia Dana M.D.   On: 12/21/2018  14:07   Vas [place] Mesenteric  Result Date: 12/20/2018 ABDOMINAL VISCERAL Indications: acute abdomen pain High Risk Factors: Hypertension. Performing Technologist: June Leap RDMS, RVT  Examination Guidelines: A complete evaluation includes B-mode imaging, spectral Doppler, color Doppler, and power Doppler as needed of all accessible portions of each vessel. Bilateral testing is considered an integral part of a complete examination. Limited examinations for reoccurring indications may be performed as noted.  Duplex Findings: +--------------------+--------+--------+------+--------+ Mesenteric          PSV cm/sEDV cm/sPlaqueComments +--------------------+--------+--------+------+--------+ Aorta Prox            158      29                  +--------------------+--------+--------+------+--------+ Aorta Mid              98      13                  +--------------------+--------+--------+------+--------+ Celiac Artery Origin  138      21                  +--------------------+--------+--------+------+--------+ SMA Proximal          201      26                  +--------------------+--------+--------+------+--------+ SMA Mid               143      0                   +--------------------+--------+--------+------+--------+ SMA Distal            189      0                   +--------------------+--------+--------+------+--------+ CHA                   138      22                  +--------------------+--------+--------+------+--------+ Splenic               238      38                  +--------------------+--------+--------+------+--------+ IMA                   100      2                   +--------------------+--------+--------+------+--------+  Summary: Mesenteric: Normal Celiac artery , Superior Mesenteric artery and Inferior Mesenteric artery findings.  *See table(s) above for measurements and observations.  Diagnosing physician: Servando Snare MD   Electronically signed by Servando Snare MD on 12/20/2018 at 2:14:40 PM.    Final    US Liver Doppler  Result Date: 12/21/2018 CLINICAL DATA:  85 year old female with possible cirrhosis EXAM: DUPLEX ULTRASOUND OF LIVER TECHNIQUE: Color and duplex Doppler ultrasound was performed to evaluate the hepatic in-flow and out-flow vessels. COMPARISON:  None. FINDINGS: Portal Vein Velocities Main:  37 cm/sec Right:  29 cm/sec Left:  24 cm/sec Hepatic Vein Velocities Right:  42 cm/sec Middle:  41 cm/sec Left:  47 cm/sec Hepatic Artery Velocity:  55 cm/sec Splenic  Vein Velocity:  31 cm/sec Varices: Absent Ascites: Small volume ascites of the abdomen. Spleen measures 7 cm x 8.4 cm x 4.8 cm with a volume of 145 cc Mild nodular contour of the liver surface on the high-resolution images. IMPRESSION: Unremarkable directed duplex of the hepatic vasculature. Evidence of early cirrhotic changes with scant ascites. Electronically Signed   By: Corrie Mckusick D.O.   On: 12/21/2018 12:40   US Abdomen Limited Ruq  Result Date: 12/20/2018 CLINICAL DATA:  RIGHT upper quadrant pain, cholelithiasis by CT EXAM: ULTRASOUND ABDOMEN LIMITED RIGHT UPPER QUADRANT COMPARISON:  CT abdomen and pelvis 12/20/2018 FINDINGS: Gallbladder: Large calcified gallstone 5.6 cm diameter with significant shadowing fills the mid and fundal portions of the gallbladder. Lower gallbladder segment normal appearance. No definite gallbladder wall thickening seen, though assessment at the level of the large calculus is limited. No sonographic Murphy sign or pericholecystic fluid. Common bile duct: Diameter: 5 mm, normal Liver: Normal echogenicity without mass or nodularity. Portal vein is patent on color Doppler imaging with normal direction of blood flow towards the liver. Other: No RIGHT upper quadrant free fluid. IMPRESSION: Large calcified gallstone 5.6 cm diameter filling the mid to fundal portions of the gallbladder with significant shadowing. Remaining  gallbladder normal appearance. No biliary dilatation or focal hepatic sonographic abnormalities. Electronically Signed   By: Lavonia Dana M.D.   On: 12/20/2018 11:36    Anti-infectives: Anti-infectives (From admission, onward)   Start     Dose/Rate Route Frequency Ordered Stop   12/20/18 1630  metroNIDAZOLE (FLAGYL) IVPB 500 mg  Status:  Discontinued     500 mg 100 mL/hr over 60 Minutes Intravenous Every 8 hours 12/20/18 1554 12/20/18 1607   12/20/18 1300  cefTRIAXone (ROCEPHIN) 2 g in sodium chloride 0.9 % 100 mL IVPB     2 g 200 mL/hr over 30 Minutes Intravenous Every 24 hours 12/20/18 1253     12/20/18 1245  metroNIDAZOLE (FLAGYL) IVPB 500 mg     500 mg 100 mL/hr over 60 Minutes Intravenous Every 8 hours 12/20/18 1244         Assessment/Plan Hypertension AKI on CKD - previous right arm AV fistula creation but has never been on dialysis - creatinine 4.9 COVID negative(8/17)  Upper abdominal pain  Gallstones  - Cholelithiasis/possible cholecystitis - HIDA negative - arterial duplex negative for any arterial insufficiency and liver Doppler suggests flow within the portal and splenic veins making mesenteric venous thrombosis much less likely - vascular has signed off  - 5.6 cm gallbladder stone, otherwise normal GB; CBD 5 mm  FEN: Iv fluids/clear liquids, NPO after MN ID:  Rocephin/flagyl 8/17  >> Day 3 DVT:  sq heparin Follow up:  TBD  Plan:  Abdominal film pending. Tentatively plan for laparoscopic cholecystectomy tomorrow if cleared by Medicine. Continue clear liquids, NPO after midnight.   LOS: 2 days    Wellington Hampshire , [facility] 12/22/2018, 8:32 AM Pager: [phone] Mon-Thurs 7:00 am-4:30 pm Fri 7:00 am -11:30 AM Sat-Sun 7:00 am-11:30 am

## 2018-12-22 NOTE — Anesthesia Preprocedure Evaluation (Addendum)
Anesthesia Evaluation  Patient identified by MRN, date of birth, ID band Patient awake    Reviewed: Allergy & Precautions, H&P , NPO status , Patient's Chart, lab work & pertinent test results  Airway Mallampati: II  TM Distance: >3 FB Neck ROM: Full    Dental no notable dental hx. (+) Teeth Intact, Dental Advisory Given   Pulmonary neg pulmonary ROS,    Pulmonary exam normal breath sounds clear to auscultation       Cardiovascular Exercise Tolerance: Good hypertension, Pt. on medications  Rhythm:Regular Rate:Normal     Neuro/Psych negative neurological ROS  negative psych ROS   GI/Hepatic negative GI ROS, Neg liver ROS,   Endo/Other  negative endocrine ROS  Renal/GU Renal disease  negative genitourinary   Musculoskeletal   Abdominal   Peds  Hematology  (+) Blood dyscrasia, anemia ,   Anesthesia Other Findings   Reproductive/Obstetrics negative OB ROS                            Anesthesia Physical Anesthesia Plan  ASA: II  Anesthesia Plan: General   Post-op Pain Management:    Induction: Intravenous  PONV Risk Score and Plan: 4 or greater and Ondansetron, Dexamethasone and Midazolam  Airway Management Planned: Oral ETT  Additional Equipment:   Intra-op Plan:   Post-operative Plan: Extubation in OR  Informed Consent: I have reviewed the patients History and Physical, chart, labs and discussed the procedure including the risks, benefits and alternatives for the proposed anesthesia with the patient or authorized representative who has indicated his/her understanding and acceptance.     Dental advisory given  Plan Discussed with: CRNA and Surgeon  Anesthesia Plan Comments:        Anesthesia Quick Evaluation

## 2018-12-22 NOTE — H&P (View-Only) (Signed)
[facility] Progress Note     Subjective: CC-  Much more awake and alert today. States that the morphine/phenergan helped her pain and nausea yesterday but it knocked her out. Continues to have upper abdominal pain and intermittent nausea. No emesis. Tolerating few liquids. Feels bloated. No BM since admission.  About to head down for an abdominal film.  Objective: Vital signs in last 24 hours: Temp:  [97.4 F (36.3 C)-98.5 F (36.9 C)] 98.2 F (36.8 C) (08/19 0659) Pulse Rate:  [59-64] 60 (08/19 0659) Resp:  [18-20] 19 (08/19 0659) BP: (127-134)/(43-58) 131/52 (08/19 0659) SpO2:  [94 %-99 %] 99 % (08/19 0659) Weight:  [57.5 kg] 57.5 kg (08/18 2118) Last BM Date: 12/19/18  Intake/Output from previous day: 08/18 0701 - 08/19 0700 In: 693.5 [P.O.:300; I.V.:193.5; IV Piggyback:200] Out: 50 [Urine:50] Intake/Output this shift: Total I/O In: 100 [P.O.:100] Out: 200 [Urine:200]  PE: Gen:  Alert, NAD, pleasant HEENT: EOM's intact, pupils equal and round Pulm:  Rate and effort normal Abd: Soft, mild distension, +BS, tender RUQ/epigastric region without rebound or guarding Skin: no rashes noted, warm and dry  Lab Results:  Recent Labs    12/20/18 0835 12/22/18 0630  WBC 5.7 10.2  HGB 10.0* 9.6*  HCT 30.7* 29.7*  PLT 189 176   BMET Recent Labs    12/21/18 0539 12/22/18 0630  NA 139 137  K 4.6 4.6  CL 114* 111  CO2 17* 19*  GLUCOSE 128* 117*  BUN 55* 56*  CREATININE 4.06* 4.90*  CALCIUM 8.7* 8.5*   PT/INR No results for input(s): LABPROT, INR in the last 72 hours. CMP     Component Value Date/Time   NA 137 12/22/2018 0630   K 4.6 12/22/2018 0630   CL 111 12/22/2018 0630   CO2 19 (L) 12/22/2018 0630   GLUCOSE 117 (H) 12/22/2018 0630   BUN 56 (H) 12/22/2018 0630   CREATININE 4.90 (H) 12/22/2018 0630   CALCIUM 8.5 (L) 12/22/2018 0630   PROT 6.2 (L) 12/20/2018 0835   ALBUMIN 3.0 (L) 12/22/2018 0630   AST 17 12/20/2018 0835   ALT 15  12/20/2018 0835   ALKPHOS 50 12/20/2018 0835   BILITOT 0.7 12/20/2018 0835   GFRNONAA 7 (L) 12/22/2018 0630   GFRAA 9 (L) 12/22/2018 0630   Lipase     Component Value Date/Time   LIPASE 35 12/20/2018 0835       Studies/Results: Ct Abdomen Pelvis Wo Contrast  Result Date: 12/20/2018 CLINICAL DATA:  Abdominal pain starting this morning. Abdominal distention. Chronic renal disease. EXAM: CT ABDOMEN AND PELVIS WITHOUT CONTRAST TECHNIQUE: Multidetector CT imaging of the abdomen and pelvis was performed following the standard protocol without IV contrast. COMPARISON:  None. FINDINGS: Lower chest: Patchy ground-glass opacities with some associated interstitial accentuation in both lower lobes, in the lingula, and in the right middle lobe. Descending thoracic aortic atherosclerotic calcification. Hepatobiliary: Mixed density in the gallbladder fundus with calcified elements, nitrogen gas elements, and surrounding density compatible with either large conglomerate gallstones or gallstones with surrounding sludge. Underlying mass of the gallbladder is less likely. I do not see gallbladder wall thickening in the proximal gallbladder. Noncontrast CT appearance of the liver otherwise unremarkable. Pancreas: Unremarkable Spleen: Unremarkable Adrenals/Urinary Tract: Both adrenal glands appear normal. The noncontrast CT appearance of the right kidney is unremarkable. In the left kidney upper pole, there is a homogeneously dense 3.3 by 2.9 cm partially exophytic lesion with internal density of 61 Hounsfield units, more dense than the  adjacent renal parenchyma. Posteriorly in the right kidney lower pole there is a 1.1 cm hypodense lesion on image 28/3, probably a cyst. Centrally along the right kidney lower pole and partially extending into the renal pelvis, a 2.7 by 2.1 cm fluid density lesion is present on image 42/6, probably a parapelvic cyst but not entirely specific. In the right kidney upper pole, adjacent to  the homogeneous complex lesion, there is a somewhat more heterogeneous 3.0 by 2.3 cm lesion shown on image 36/6, which appears to probably have a punctate internal calcification. Stomach/Bowel: There is abnormal stranding in loops of small bowel mesentery in the central and right upper pelvis. Normal appendix. No dilated bowel. No pneumatosis. Sigmoid colon diverticulosis is present. No extraluminal gas or abscess. Vascular/Lymphatic: Aortoiliac atherosclerotic vascular disease. No pathologic adenopathy. Reproductive: Unremarkable Other: Small localized amount of ascites along the anterior margin of the inferior right hepatic lobe for example images 32-34 of series 3. Musculoskeletal: Left hip effusion. Questionable left iliopsoas bursitis. Grade 1 degenerative retrolisthesis at T12-L1 and grade 1 degenerative anterolisthesis at L3-4 and L4-5. Multilevel Schmorl's nodes in the lumbar spine with spondylosis and degenerative disc disease causing mild multilevel impingement. Degenerative arthropathy of both hips, left greater than right. Mild levoconvex lumbar scoliosis. IMPRESSION: 1. The dominant finding most likely correlating with the patient's symptoms is abnormal mesenteric stranding along several loops of small bowel the central and right upper pelvis. The appearance is nonspecific but could reflect edema associated with inflammation or even ischemia, although there is no appreciable pneumatosis or extraluminal gas at this time, and no dilated bowel. 2. There is a small amount of ascites adjacent to the inferior right hepatic lobe margin. 3. Large conglomerate gallstones filling much of the gallbladder, possibly with surrounding sludge. Strictly speaking, cholecystitis is not readily excluded given the unusual appearance of density around rim of the gallstone. 4. Multiple renal lesions are present. There are 2 complex lesions of the left kidney upper pole, 1 of these is homogeneously dense and probably a complex  cyst, but the lesion next to it is somewhat more heterogeneous and could represent a mass/malignancy. I am aware that the patient cannot have IV contrast at this time due to renal insufficiency. Possibilities for further workup might include pelvic ultrasound to assess for vascularity in either lesion, or follow up surveillance CT or MRI. 5. Patchy ground-glass opacities at both lung bases. Chronicity is not established. Possibilities may include hypersensitivity pneumonitis, atypical pneumonia, pulmonary hemorrhage, or chronic pulmonary embolus. 6. Left hip effusion with questionable left iliopsoas bursitis. Left greater than right degenerative hip arthropathy. 7. Mild multilevel impingement in the lumbar spine due to spondylosis and degenerative disc disease. 8.  Aortic Atherosclerosis (ICD10-I70.0). Electronically Signed   By: Van Clines M.D.   On: 12/20/2018 09:59   Nm Hepatobiliary Liver Func  Result Date: 12/21/2018 CLINICAL DATA:  Severe upper abdominal pain and bloating, cholelithiasis by CT EXAM: NUCLEAR MEDICINE HEPATOBILIARY IMAGING TECHNIQUE: Sequential images of the abdomen were obtained out to 60 minutes following intravenous administration of radiopharmaceutical. Patient was unable to perform assessment of gallbladder emptying following fatty meal stimulation due to severe pain and inability to drink ensure. RADIOPHARMACEUTICALS:  5.2 mCi Tc-28m  Choletec IV COMPARISON:  None Correlation: CT abdomen and pelvis 12/20/2018 FINDINGS: Prompt tracer clearance from bloodstream indicating normal hepatocellular function. Prompt excretion of tracer into biliary tree. Small bowel visualized at 28 minutes. Beginning at 36 minutes, tracer is visualized within the gallbladder. No hepatic retention of tracer.  IMPRESSION: Normal hepatocellular function. Patent cystic duct and common bile duct. No scintigraphic evidence of acute cholecystitis. Electronically Signed   By: Lavonia Dana M.D.   On: 12/21/2018  14:07   Vas [place] Mesenteric  Result Date: 12/20/2018 ABDOMINAL VISCERAL Indications: acute abdomen pain High Risk Factors: Hypertension. Performing Technologist: June Leap RDMS, RVT  Examination Guidelines: A complete evaluation includes B-mode imaging, spectral Doppler, color Doppler, and power Doppler as needed of all accessible portions of each vessel. Bilateral testing is considered an integral part of a complete examination. Limited examinations for reoccurring indications may be performed as noted.  Duplex Findings: +--------------------+--------+--------+------+--------+ Mesenteric          PSV cm/sEDV cm/sPlaqueComments +--------------------+--------+--------+------+--------+ Aorta Prox            158      29                  +--------------------+--------+--------+------+--------+ Aorta Mid              98      13                  +--------------------+--------+--------+------+--------+ Celiac Artery Origin  138      21                  +--------------------+--------+--------+------+--------+ SMA Proximal          201      26                  +--------------------+--------+--------+------+--------+ SMA Mid               143      0                   +--------------------+--------+--------+------+--------+ SMA Distal            189      0                   +--------------------+--------+--------+------+--------+ CHA                   138      22                  +--------------------+--------+--------+------+--------+ Splenic               238      38                  +--------------------+--------+--------+------+--------+ IMA                   100      2                   +--------------------+--------+--------+------+--------+  Summary: Mesenteric: Normal Celiac artery , Superior Mesenteric artery and Inferior Mesenteric artery findings.  *See table(s) above for measurements and observations.  Diagnosing physician: Servando Snare MD   Electronically signed by Servando Snare MD on 12/20/2018 at 2:14:40 PM.    Final    US Liver Doppler  Result Date: 12/21/2018 CLINICAL DATA:  85 year old female with possible cirrhosis EXAM: DUPLEX ULTRASOUND OF LIVER TECHNIQUE: Color and duplex Doppler ultrasound was performed to evaluate the hepatic in-flow and out-flow vessels. COMPARISON:  None. FINDINGS: Portal Vein Velocities Main:  37 cm/sec Right:  29 cm/sec Left:  24 cm/sec Hepatic Vein Velocities Right:  42 cm/sec Middle:  41 cm/sec Left:  47 cm/sec Hepatic Artery Velocity:  55 cm/sec Splenic  Vein Velocity:  31 cm/sec Varices: Absent Ascites: Small volume ascites of the abdomen. Spleen measures 7 cm x 8.4 cm x 4.8 cm with a volume of 145 cc Mild nodular contour of the liver surface on the high-resolution images. IMPRESSION: Unremarkable directed duplex of the hepatic vasculature. Evidence of early cirrhotic changes with scant ascites. Electronically Signed   By: Corrie Mckusick D.O.   On: 12/21/2018 12:40   US Abdomen Limited Ruq  Result Date: 12/20/2018 CLINICAL DATA:  RIGHT upper quadrant pain, cholelithiasis by CT EXAM: ULTRASOUND ABDOMEN LIMITED RIGHT UPPER QUADRANT COMPARISON:  CT abdomen and pelvis 12/20/2018 FINDINGS: Gallbladder: Large calcified gallstone 5.6 cm diameter with significant shadowing fills the mid and fundal portions of the gallbladder. Lower gallbladder segment normal appearance. No definite gallbladder wall thickening seen, though assessment at the level of the large calculus is limited. No sonographic Murphy sign or pericholecystic fluid. Common bile duct: Diameter: 5 mm, normal Liver: Normal echogenicity without mass or nodularity. Portal vein is patent on color Doppler imaging with normal direction of blood flow towards the liver. Other: No RIGHT upper quadrant free fluid. IMPRESSION: Large calcified gallstone 5.6 cm diameter filling the mid to fundal portions of the gallbladder with significant shadowing. Remaining  gallbladder normal appearance. No biliary dilatation or focal hepatic sonographic abnormalities. Electronically Signed   By: Lavonia Dana M.D.   On: 12/20/2018 11:36    Anti-infectives: Anti-infectives (From admission, onward)   Start     Dose/Rate Route Frequency Ordered Stop   12/20/18 1630  metroNIDAZOLE (FLAGYL) IVPB 500 mg  Status:  Discontinued     500 mg 100 mL/hr over 60 Minutes Intravenous Every 8 hours 12/20/18 1554 12/20/18 1607   12/20/18 1300  cefTRIAXone (ROCEPHIN) 2 g in sodium chloride 0.9 % 100 mL IVPB     2 g 200 mL/hr over 30 Minutes Intravenous Every 24 hours 12/20/18 1253     12/20/18 1245  metroNIDAZOLE (FLAGYL) IVPB 500 mg     500 mg 100 mL/hr over 60 Minutes Intravenous Every 8 hours 12/20/18 1244         Assessment/Plan Hypertension AKI on CKD - previous right arm AV fistula creation but has never been on dialysis - creatinine 4.9 COVID negative(8/17)  Upper abdominal pain  Gallstones  - Cholelithiasis/possible cholecystitis - HIDA negative - arterial duplex negative for any arterial insufficiency and liver Doppler suggests flow within the portal and splenic veins making mesenteric venous thrombosis much less likely - vascular has signed off  - 5.6 cm gallbladder stone, otherwise normal GB; CBD 5 mm  FEN: Iv fluids/clear liquids, NPO after MN ID:  Rocephin/flagyl 8/17  >> Day 3 DVT:  sq heparin Follow up:  TBD  Plan:  Abdominal film pending. Tentatively plan for laparoscopic cholecystectomy tomorrow if cleared by Medicine. Continue clear liquids, NPO after midnight.   LOS: 2 days    Wellington Hampshire , [facility] 12/22/2018, 8:32 AM Pager: [phone] Mon-Thurs 7:00 am-4:30 pm Fri 7:00 am -11:30 AM Sat-Sun 7:00 am-11:30 am

## 2018-12-22 NOTE — Plan of Care (Signed)
  Problem: Education: Goal: Knowledge of General Education information will improve Description: Including pain rating scale, medication(s)/side effects and non-pharmacologic comfort measures Outcome: Progressing   Problem: Elimination: Goal: Will not experience complications related to bowel motility Outcome: Progressing   Problem: Safety: Goal: Ability to remain free from injury will improve Outcome: Progressing   

## 2018-12-22 NOTE — Progress Notes (Signed)
[facility] Progress Note  Name: Yvonne Powell MRN: [id number] DOB: [birth year]  Chief Complaint:  abd pain   Subjective:  Patient states she is having gallbladder out tomorrow.  Has been able to tolerate some clears.  Strict ins and outs are not available.  Nurse states she had 450 mL UOP this morning.  She wants HD when needed and was told they were happy with her fistula.   Review of systems:  abd pain better Denies n/v No shortness of breath    Intake/Output Summary (Last 24 hours) at 12/22/2018 1347 Last data filed at 12/22/2018 1140 Gross per 24 hour  Intake 793.46 ml  Output 500 ml  Net 293.46 ml    Vitals:  Vitals:   12/21/18 1641 12/21/18 2118 12/22/18 0659 12/22/18 0932  BP: (!) 127/58 (!) 134/47 (!) 131/52 (!) 158/100  Pulse: 61 64 60 83  Resp: 18 20 19 16   Temp: 98.5 F (36.9 C) 97.9 F (36.6 C) 98.2 F (36.8 C) 98.1 F (36.7 C)  TempSrc: Oral Oral Oral Oral  SpO2: 94% 95% 99% 95%  Weight:  57.5 kg    Height:         Physical Exam:  General adult female in bed in no acute distress HEENT normocephalic atraumatic extraocular movements intact sclera anicteric Neck supple trachea midline Lungs clear to auscultation bilaterally normal work of breathing at rest  Heart regular rate and rhythm no rubs or gallops appreciated Abdomen soft tender RUQ nondistended Extremities no edema  Psych normal mood and affect Access RUE AVF bruit and thrill   Medications reviewed   Labs:  BMP Latest Ref Rng & Units 12/22/2018 12/21/2018 12/20/2018  Glucose 70 - 99 mg/dL 117(H) 128(H) 129(H)  BUN 8 - 23 mg/dL 56(H) 55(H) 65(H)  Creatinine 0.44 - 1.00 mg/dL 4.90(H) 4.06(H) 4.29(H)  Sodium 135 - 145 mmol/L 137 139 140  Potassium 3.5 - 5.1 mmol/L 4.6 4.6 4.5  Chloride 98 - 111 mmol/L 111 114(H) 113(H)  CO2 22 - 32 mmol/L 19(L) 17(L) 18(L)  Calcium 8.9 - 10.3 mg/dL 8.5(L) 8.7(L) 9.2     Assessment/Plan:   # CKD stage V  - BL Cr reported as 3.8 per a 7/29  nephrology visit  - Has no acute indication for dialysis but has an AVF RUE with bruit and thrill.  - Creatinine is climbing and anticipate possible need for dialysis soon  - Strict ins and outs  - Normal saline at 75 ml/hr x 12 hours   # Abdominal pain  - Planning for laparascopic cholecystectomy on 8/20 - s/p vascular surgery consult and arterial duplex negative for any arterial insufficiency and mesenteric venous thrombosis felt unlikely.    # Metabolic acidosis   Restart oral bicarb as tolerated for acidosis.  On gentle IV fluids limited duration.   # HTN  - on dilt and doxazosin and previously controlled  - follow trends - Pain control has since improved   # anemia normocytic  - no acute indication for PRBC's  Claudia Desanctis, MD 12/22/2018 1:47 PM

## 2018-12-22 NOTE — Progress Notes (Signed)
PROGRESS NOTE    Yvonne Powell  KCL:[id number] DOB: 1929/02/16 DOA: 12/20/2018 PCP: Leeroy Cha, MD   Brief Narrative:  HPI on 12/20/2018 by Dr. Merton Border Yvonne Powell  is a 85 y.o. female, with past medical history significant for hypertension, chronic kidney disease stage V not on hemodialysis yet, history of DVTs and anemia presenting today with 1 day history of generalized abdominal pain that started early in the morning today.  No nausea vomiting or diarrhea, no history of blood in the stools.  No urinary symptoms Patient denies any cough or shortness of breath. Patient has history of advanced renal failure and had an AV fistula placed on the right in 2018. Work-up in the emergency room showed cholelithiasis with no evidence of cholecystitis since CT was done without contrast and some evidence of mesenteric ischemia versus infectious colitis.  Blood work is unremarkable, except for elevated creatinine at 4.29 hemoglobin of 10. General and vascular surgery evaluated patient in the emergency room and wanted to be admitted and observed at this time. Advised HIDA scan.  Interim history Patient admitted with abdominal pain.  CT showed evidence of mesenteric ischemia versus infectious colitis.  Abdominal ultrasound showed gallstones.  HIDA scan unremarkable.  General surgery and vascular surgery consulted and appreciated.  Planning for laparoscopic cholecystectomy on 12/23/2018. Assessment & Plan   Intractable abdominal pain -Present on admission and still ongoing -CT abdomen: Mesenteric stranding along several loops of small bowel, appearance nonspecific could reflect inflammation versus ischemia.  Large conglomerate gallstones feeling much of the gallbladder, possibly with surrounding sludge. -Right upper quadrant ultrasound showed 5.6 cm gallstone -HIDA scan unremarkable -Patient was placed on IV antibiotics with ceftriaxone and Flagyl -Vascular surgery consulted and  appreciated for possible mesenteric ischemia however arterial duplex is negative for any arterial insufficiency and liver Doppler suggests a flow within the portal as well as veins making mesenteric venous thrombosis much less likely.  No role for vascular intervention at this time. -General surgery consulted and appreciated, planning for laparoscopic cholecystectomy on 12/23/2018 -Continue pain control antiemetics as needed -Given abdominal distention, will obtain abdominal x-ray -Patient low to moderate risk for surgical intervention.  Chronic kidney disease, stage V -Status post AV fistula placement 2018 -Nephrology consulted and appreciated -Continue sodium bicarb tablets  Essential hypertension -Continue Cardizem, Cardura  Hypothyroidism -Continue Synthroid  DVT Prophylaxis Heparin  Code Status: Full  Family Communication: None at bedside  Disposition Plan: Admitted.  Pending laparoscopic cholecystectomy on 12/23/2018.  Suspect home thereafter  Consultants General surgery Vascular surgery  Procedures  Right upper quadrant ultrasound HIDA scan  Antibiotics   Anti-infectives (From admission, onward)   Start     Dose/Rate Route Frequency Ordered Stop   12/20/18 1630  metroNIDAZOLE (FLAGYL) IVPB 500 mg  Status:  Discontinued     500 mg 100 mL/hr over 60 Minutes Intravenous Every 8 hours 12/20/18 1554 12/20/18 1607   12/20/18 1300  cefTRIAXone (ROCEPHIN) 2 g in sodium chloride 0.9 % 100 mL IVPB     2 g 200 mL/hr over 30 Minutes Intravenous Every 24 hours 12/20/18 1253     12/20/18 1245  metroNIDAZOLE (FLAGYL) IVPB 500 mg     500 mg 100 mL/hr over 60 Minutes Intravenous Every 8 hours 12/20/18 1244        Subjective:   Yvonne Powell seen and examined today.  Feels her abdomen is distended this morning.  Continues to have some pain.  Denies current chest pain or shortness of breath, nausea or  vomiting, dizziness or headache.  States she has not had a bowel movement since  being here as she has not had much to eat.  Objective:   Vitals:   12/21/18 1641 12/21/18 2118 12/22/18 0659 12/22/18 0932  BP: (!) 127/58 (!) 134/47 (!) 131/52 (!) 158/100  Pulse: 61 64 60 83  Resp: 18 20 19 16   Temp: 98.5 F (36.9 C) 97.9 F (36.6 C) 98.2 F (36.8 C) 98.1 F (36.7 C)  TempSrc: Oral Oral Oral Oral  SpO2: 94% 95% 99% 95%  Weight:  57.5 kg    Height:        Intake/Output Summary (Last 24 hours) at 12/22/2018 1006 Last data filed at 12/22/2018 0800 Gross per 24 hour  Intake 793.46 ml  Output 250 ml  Net 543.46 ml   Filed Weights   12/20/18 0835 12/21/18 2118  Weight: 57.6 kg 57.5 kg    Exam  General: Well developed, well nourished, NAD, appears stated age  43: NCAT, mucous membranes moist.   Cardiovascular: S1 S2 auscultated, soft SEM, RRR  Respiratory: Clear to auscultation bilaterally with equal chest rise  Abdomen: Soft, RUQ/epigastric TTP, distended, + bowel sounds  Extremities: warm dry without cyanosis clubbing or edema  Neuro: AAOx3, nonfocal  Psych: Pleasant, appropriate mood and affect    Data Reviewed: I have personally reviewed following labs and imaging studies  CBC: Recent Labs  Lab 12/20/18 0835 12/22/18 0630  WBC 5.7 10.2  NEUTROABS 3.7  --   HGB 10.0* 9.6*  HCT 30.7* 29.7*  MCV 97.8 99.3  PLT 189 914   Basic Metabolic Panel: Recent Labs  Lab 12/20/18 0835 12/21/18 0539 12/22/18 0630  NA 140 139 137  K 4.5 4.6 4.6  CL 113* 114* 111  CO2 18* 17* 19*  GLUCOSE 129* 128* 117*  BUN 65* 55* 56*  CREATININE 4.29* 4.06* 4.90*  CALCIUM 9.2 8.7* 8.5*  MG  --  2.2  --   PHOS  --   --  5.8*   GFR: Estimated Creatinine Clearance: 6.6 mL/min (A) (by C-G formula based on SCr of 4.9 mg/dL (H)). Liver Function Tests: Recent Labs  Lab 12/20/18 0835 12/22/18 0630  AST 17  --   ALT 15  --   ALKPHOS 50  --   BILITOT 0.7  --   PROT 6.2*  --   ALBUMIN 3.7 3.0*   Recent Labs  Lab 12/20/18 0835  LIPASE 35   No  results for input(s): AMMONIA in the last 168 hours. Coagulation Profile: No results for input(s): INR, PROTIME in the last 168 hours. Cardiac Enzymes: No results for input(s): CKTOTAL, CKMB, CKMBINDEX, TROPONINI in the last 168 hours. BNP (last 3 results) No results for input(s): PROBNP in the last 8760 hours. HbA1C: No results for input(s): HGBA1C in the last 72 hours. CBG: No results for input(s): GLUCAP in the last 168 hours. Lipid Profile: No results for input(s): CHOL, HDL, LDLCALC, TRIG, CHOLHDL, LDLDIRECT in the last 72 hours. Thyroid Function Tests: No results for input(s): TSH, T4TOTAL, FREET4, T3FREE, THYROIDAB in the last 72 hours. Anemia Panel: No results for input(s): VITAMINB12, FOLATE, FERRITIN, TIBC, IRON, RETICCTPCT in the last 72 hours. Urine analysis: No results found for: COLORURINE, APPEARANCEUR, LABSPEC, PHURINE, GLUCOSEU, HGBUR, BILIRUBINUR, KETONESUR, PROTEINUR, UROBILINOGEN, NITRITE, LEUKOCYTESUR Sepsis Labs: @LABRCNTIP (procalcitonin:4,lacticidven:4)  ) Recent Results (from the past 240 hour(s))  SARS Coronavirus 2 [facility] order, Performed in [facility] hospital lab) Nasopharyngeal Nasopharyngeal Swab     Status: None  Collection Time: 12/20/18 11:41 AM   Specimen: Nasopharyngeal Swab  Result Value Ref Range Status   SARS Coronavirus 2 NEGATIVE NEGATIVE Final    Comment: (NOTE) If result is NEGATIVE SARS-CoV-2 target nucleic acids are NOT DETECTED. The SARS-CoV-2 RNA is generally detectable in upper and lower  respiratory specimens during the acute phase of infection. The lowest  concentration of SARS-CoV-2 viral copies this assay can detect is 250  copies / mL. A negative result does not preclude SARS-CoV-2 infection  and should not be used as the sole basis for treatment or other  patient management decisions.  A negative result may occur with  improper specimen collection / handling, submission of specimen other  than nasopharyngeal swab,  presence of viral mutation(s) within the  areas targeted by this assay, and inadequate number of viral copies  (<250 copies / mL). A negative result must be combined with clinical  observations, patient history, and epidemiological information. If result is POSITIVE SARS-CoV-2 target nucleic acids are DETECTED. The SARS-CoV-2 RNA is generally detectable in upper and lower  respiratory specimens dur ing the acute phase of infection.  Positive  results are indicative of active infection with SARS-CoV-2.  Clinical  correlation with patient history and other diagnostic information is  necessary to determine patient infection status.  Positive results do  not rule out bacterial infection or co-infection with other viruses. If result is PRESUMPTIVE POSTIVE SARS-CoV-2 nucleic acids MAY BE PRESENT.   A presumptive positive result was obtained on the submitted specimen  and confirmed on repeat testing.  While 2019 novel coronavirus  (SARS-CoV-2) nucleic acids may be present in the submitted sample  additional confirmatory testing may be necessary for epidemiological  and / or clinical management purposes  to differentiate between  SARS-CoV-2 and other Sarbecovirus currently known to infect humans.  If clinically indicated additional testing with an alternate test  methodology [phone]) is advised. The SARS-CoV-2 RNA is generally  detectable in upper and lower respiratory sp ecimens during the acute  phase of infection. The expected result is Negative. Fact Sheet for Patients:  StrictlyIdeas.no Fact Sheet for Healthcare Providers: BankingDealers.co.za This test is not yet approved or cleared by the [place] FDA and has been authorized for detection and/or diagnosis of SARS-CoV-2 by FDA under an Emergency Use Authorization (EUA).  This EUA will remain in effect (meaning this test can be used) for the duration of the COVID-19 declaration under  Section 564(b)(1) of the Act, 21 U.S.C. section 360bbb-3(b)(1), unless the authorization is terminated or revoked sooner. Performed at [facility], [place] [address] [place], [place] [address]       Radiology Studies: Dg Abd 1 View  Result Date: 12/22/2018 CLINICAL DATA:  Abdominal distension. EXAM: ABDOMEN - 1 VIEW COMPARISON:  None. FINDINGS: The bowel gas pattern is normal. No radio-opaque calculi or other significant radiographic abnormality are seen. IMPRESSION: No evidence of bowel obstruction or ileus. Electronically Signed   By: Marijo Conception M.D.   On: 12/22/2018 08:37   Nm Hepatobiliary Liver Func  Result Date: 12/21/2018 CLINICAL DATA:  Severe upper abdominal pain and bloating, cholelithiasis by CT EXAM: NUCLEAR MEDICINE HEPATOBILIARY IMAGING TECHNIQUE: Sequential images of the abdomen were obtained out to 60 minutes following intravenous administration of radiopharmaceutical. Patient was unable to perform assessment of gallbladder emptying following fatty meal stimulation due to severe pain and inability to drink ensure. RADIOPHARMACEUTICALS:  5.2 mCi Tc-102m  Choletec IV COMPARISON:  None Correlation: CT abdomen and pelvis  12/20/2018 FINDINGS: Prompt tracer clearance from bloodstream indicating normal hepatocellular function. Prompt excretion of tracer into biliary tree. Small bowel visualized at 28 minutes. Beginning at 36 minutes, tracer is visualized within the gallbladder. No hepatic retention of tracer. IMPRESSION: Normal hepatocellular function. Patent cystic duct and common bile duct. No scintigraphic evidence of acute cholecystitis. Electronically Signed   By: Lavonia Dana M.D.   On: 12/21/2018 14:07   Vas [place] Mesenteric  Result Date: 12/20/2018 ABDOMINAL VISCERAL Indications: acute abdomen pain High Risk Factors: Hypertension. Performing Technologist: June Leap RDMS, RVT  Examination Guidelines: A complete evaluation includes B-mode imaging, spectral Doppler, color  Doppler, and power Doppler as needed of all accessible portions of each vessel. Bilateral testing is considered an integral part of a complete examination. Limited examinations for reoccurring indications may be performed as noted.  Duplex Findings: +--------------------+--------+--------+------+--------+  Mesenteric           PSV cm/s EDV cm/s Plaque Comments  +--------------------+--------+--------+------+--------+  Aorta Prox             158       29                     +--------------------+--------+--------+------+--------+  Aorta Mid               98       13                     +--------------------+--------+--------+------+--------+  Celiac Artery Origin   138       21                     +--------------------+--------+--------+------+--------+  SMA Proximal           201       26                     +--------------------+--------+--------+------+--------+  SMA Mid                143       0                      +--------------------+--------+--------+------+--------+  SMA Distal             189       0                      +--------------------+--------+--------+------+--------+  CHA                    138       22                     +--------------------+--------+--------+------+--------+  Splenic                238       38                     +--------------------+--------+--------+------+--------+  IMA                    100       2                      +--------------------+--------+--------+------+--------+  Summary: Mesenteric: Normal Celiac artery , Superior Mesenteric artery and Inferior Mesenteric artery findings.  *See table(s) above for measurements and observations.  Diagnosing physician: Erlene Quan  Donzetta Matters MD  Electronically signed by Servando Snare MD on 12/20/2018 at 2:14:40 PM.    Final    US Liver Doppler  Result Date: 12/21/2018 CLINICAL DATA:  85 year old female with possible cirrhosis EXAM: DUPLEX ULTRASOUND OF LIVER TECHNIQUE: Color and duplex Doppler ultrasound was performed to evaluate  the hepatic in-flow and out-flow vessels. COMPARISON:  None. FINDINGS: Portal Vein Velocities Main:  37 cm/sec Right:  29 cm/sec Left:  24 cm/sec Hepatic Vein Velocities Right:  42 cm/sec Middle:  41 cm/sec Left:  47 cm/sec Hepatic Artery Velocity:  55 cm/sec Splenic Vein Velocity:  31 cm/sec Varices: Absent Ascites: Small volume ascites of the abdomen. Spleen measures 7 cm x 8.4 cm x 4.8 cm with a volume of 145 cc Mild nodular contour of the liver surface on the high-resolution images. IMPRESSION: Unremarkable directed duplex of the hepatic vasculature. Evidence of early cirrhotic changes with scant ascites. Electronically Signed   By: Corrie Mckusick D.O.   On: 12/21/2018 12:40   US Abdomen Limited Ruq  Result Date: 12/20/2018 CLINICAL DATA:  RIGHT upper quadrant pain, cholelithiasis by CT EXAM: ULTRASOUND ABDOMEN LIMITED RIGHT UPPER QUADRANT COMPARISON:  CT abdomen and pelvis 12/20/2018 FINDINGS: Gallbladder: Large calcified gallstone 5.6 cm diameter with significant shadowing fills the mid and fundal portions of the gallbladder. Lower gallbladder segment normal appearance. No definite gallbladder wall thickening seen, though assessment at the level of the large calculus is limited. No sonographic Murphy sign or pericholecystic fluid. Common bile duct: Diameter: 5 mm, normal Liver: Normal echogenicity without mass or nodularity. Portal vein is patent on color Doppler imaging with normal direction of blood flow towards the liver. Other: No RIGHT upper quadrant free fluid. IMPRESSION: Large calcified gallstone 5.6 cm diameter filling the mid to fundal portions of the gallbladder with significant shadowing. Remaining gallbladder normal appearance. No biliary dilatation or focal hepatic sonographic abnormalities. Electronically Signed   By: Lavonia Dana M.D.   On: 12/20/2018 11:36     Scheduled Meds:  calcitRIOL  0.25 mcg Oral 2 times weekly   diltiazem  300 mg Oral Daily   docusate sodium  100 mg Oral BID    doxazosin  8 mg Oral Daily   heparin  5,000 Units Subcutaneous Q12H   levothyroxine  88 mcg Oral QAC breakfast   sodium bicarbonate  650 mg Oral TID   Continuous Infusions:  sodium chloride 250 mL (12/22/18 0516)   cefTRIAXone (ROCEPHIN)  IV 2 g (12/21/18 1428)   metronidazole 500 mg (12/22/18 0501)     LOS: 2 days   Time Spent in minutes   30 minutes  Shaday Rayborn D.O. on 12/22/2018 at 10:06 AM  Between 7am to 7pm - Please see pager noted on [email or web address]  After 7pm go to [email or web address]  And look for the night coverage person covering for me after hours  [facility] Office  [phone]

## 2018-12-23 ENCOUNTER — Encounter (HOSPITAL_COMMUNITY)
Admission: EM | Disposition: A | Discharge status: Home Health | Payer: Self-pay | Source: Home / Self Care | Attending: Internal Medicine

## 2018-12-23 ENCOUNTER — Inpatient Hospital Stay (HOSPITAL_COMMUNITY): Payer: Medicare Other | Admitting: Anesthesiology

## 2018-12-23 HISTORY — PX: CHOLECYSTECTOMY: SHX55

## 2018-12-23 LAB — CBC
HCT: 27.2 % — ABNORMAL LOW (ref 36.0–46.0)
Hemoglobin: 9 g/dL — ABNORMAL LOW (ref 12.0–15.0)
MCH: 32 pg (ref 26.0–34.0)
MCHC: 33.1 g/dL (ref 30.0–36.0)
MCV: 96.8 fL (ref 80.0–100.0)
Platelets: 172 10*3/uL (ref 150–400)
RBC: 2.81 MIL/uL — ABNORMAL LOW (ref 3.87–5.11)
RDW: 12.9 % (ref 11.5–15.5)
WBC: 7.5 10*3/uL (ref 4.0–10.5)
nRBC: 0 % (ref 0.0–0.2)

## 2018-12-23 LAB — IRON AND TIBC
Iron: 39 ug/dL (ref 28–170)
Saturation Ratios: 24 % (ref 10.4–31.8)
TIBC: 165 ug/dL — ABNORMAL LOW (ref 250–450)
UIBC: 126 ug/dL

## 2018-12-23 LAB — BASIC METABOLIC PANEL
Anion gap: 9 (ref 5–15)
BUN: 52 mg/dL — ABNORMAL HIGH (ref 8–23)
CO2: 18 mmol/L — ABNORMAL LOW (ref 22–32)
Calcium: 8.5 mg/dL — ABNORMAL LOW (ref 8.9–10.3)
Chloride: 110 mmol/L (ref 98–111)
Creatinine, Ser: 4.59 mg/dL — ABNORMAL HIGH (ref 0.44–1.00)
GFR calc Af Amer: 9 mL/min — ABNORMAL LOW (ref >60)
GFR calc non Af Amer: 8 mL/min — ABNORMAL LOW (ref >60)
Glucose, Bld: 113 mg/dL — ABNORMAL HIGH (ref 70–99)
Potassium: 4.4 mmol/L (ref 3.5–5.1)
Sodium: 137 mmol/L (ref 135–145)

## 2018-12-23 LAB — FERRITIN: Ferritin: 987 ng/mL — ABNORMAL HIGH (ref 11–307)

## 2018-12-23 SURGERY — LAPAROSCOPIC CHOLECYSTECTOMY
Anesthesia: General | Site: Abdomen

## 2018-12-23 MED ORDER — BUPIVACAINE-EPINEPHRINE 0.25% -1:200000 IJ SOLN
INTRAMUSCULAR | Status: DC | PRN
  Administered 2018-12-23: 5 mL

## 2018-12-23 MED ORDER — PROPOFOL 10 MG/ML IV BOLUS
INTRAVENOUS | Status: AC
  Filled 2018-12-23: qty 20

## 2018-12-23 MED ORDER — FENTANYL CITRATE (PF) 250 MCG/5ML IJ SOLN
INTRAMUSCULAR | Status: DC | PRN
  Administered 2018-12-23: 50 ug via INTRAVENOUS
  Administered 2018-12-23: 25 ug via INTRAVENOUS

## 2018-12-23 MED ORDER — ALUM & MAG HYDROXIDE-SIMETH 200-200-20 MG/5ML PO SUSP
30.0000 mL | Freq: Four times a day (QID) | ORAL | Status: DC | PRN
  Administered 2018-12-23: 30 mL via ORAL
  Filled 2018-12-23: qty 30

## 2018-12-23 MED ORDER — ROCURONIUM BROMIDE 10 MG/ML (PF) SYRINGE
PREFILLED_SYRINGE | INTRAVENOUS | Status: DC | PRN
  Administered 2018-12-23: 30 mg via INTRAVENOUS

## 2018-12-23 MED ORDER — 0.9 % SODIUM CHLORIDE (POUR BTL) OPTIME
TOPICAL | Status: DC | PRN
  Administered 2018-12-23: 07:00:00 1000 mL

## 2018-12-23 MED ORDER — ROCURONIUM BROMIDE 10 MG/ML (PF) SYRINGE
PREFILLED_SYRINGE | INTRAVENOUS | Status: AC
  Filled 2018-12-23: qty 10

## 2018-12-23 MED ORDER — BUPIVACAINE-EPINEPHRINE (PF) 0.25% -1:200000 IJ SOLN
INTRAMUSCULAR | Status: AC
  Filled 2018-12-23: qty 30

## 2018-12-23 MED ORDER — PHENYLEPHRINE 40 MCG/ML (10ML) SYRINGE FOR IV PUSH (FOR BLOOD PRESSURE SUPPORT)
PREFILLED_SYRINGE | INTRAVENOUS | Status: AC
  Filled 2018-12-23: qty 10

## 2018-12-23 MED ORDER — LIDOCAINE 2% (20 MG/ML) 5 ML SYRINGE
INTRAMUSCULAR | Status: DC | PRN
  Administered 2018-12-23: 60 mg via INTRAVENOUS

## 2018-12-23 MED ORDER — SODIUM CHLORIDE 0.9 % IR SOLN
Status: DC | PRN
  Administered 2018-12-23 (×2): 1000 mL

## 2018-12-23 MED ORDER — SODIUM BICARBONATE 650 MG PO TABS
1300.0000 mg | ORAL_TABLET | Freq: Two times a day (BID) | ORAL | Status: DC
  Administered 2018-12-23: 1300 mg via ORAL
  Filled 2018-12-23: qty 2

## 2018-12-23 MED ORDER — ONDANSETRON HCL 4 MG/2ML IJ SOLN
INTRAMUSCULAR | Status: AC
  Filled 2018-12-23: qty 2

## 2018-12-23 MED ORDER — SUCCINYLCHOLINE CHLORIDE 200 MG/10ML IV SOSY
PREFILLED_SYRINGE | INTRAVENOUS | Status: AC
  Filled 2018-12-23: qty 10

## 2018-12-23 MED ORDER — LIDOCAINE 2% (20 MG/ML) 5 ML SYRINGE
INTRAMUSCULAR | Status: AC
  Filled 2018-12-23: qty 5

## 2018-12-23 MED ORDER — DEXAMETHASONE SODIUM PHOSPHATE 10 MG/ML IJ SOLN
INTRAMUSCULAR | Status: DC | PRN
  Administered 2018-12-23: 4 mg via INTRAVENOUS

## 2018-12-23 MED ORDER — SUCCINYLCHOLINE CHLORIDE 200 MG/10ML IV SOSY
PREFILLED_SYRINGE | INTRAVENOUS | Status: DC | PRN
  Administered 2018-12-23: 60 mg via INTRAVENOUS

## 2018-12-23 MED ORDER — SUGAMMADEX SODIUM 200 MG/2ML IV SOLN
INTRAVENOUS | Status: DC | PRN
  Administered 2018-12-23: 125 mg via INTRAVENOUS

## 2018-12-23 MED ORDER — SODIUM CHLORIDE 0.9 % IV SOLN
INTRAVENOUS | Status: AC
  Administered 2018-12-23: 14:00:00 via INTRAVENOUS

## 2018-12-23 MED ORDER — STERILE WATER FOR IRRIGATION IR SOLN
Status: DC | PRN
  Administered 2018-12-23: 1000 mL

## 2018-12-23 MED ORDER — DEXAMETHASONE SODIUM PHOSPHATE 10 MG/ML IJ SOLN
INTRAMUSCULAR | Status: AC
  Filled 2018-12-23: qty 1

## 2018-12-23 MED ORDER — HYDROMORPHONE HCL 1 MG/ML IJ SOLN: 0.2500 mg | INTRAMUSCULAR | Status: DC | PRN

## 2018-12-23 MED ORDER — PROPOFOL 10 MG/ML IV BOLUS
INTRAVENOUS | Status: DC | PRN
  Administered 2018-12-23: 100 mg via INTRAVENOUS

## 2018-12-23 MED ORDER — SODIUM CHLORIDE 0.9 % IV SOLN
INTRAVENOUS | Status: DC | PRN
  Administered 2018-12-23: 20 ug/min via INTRAVENOUS

## 2018-12-23 MED ORDER — FENTANYL CITRATE (PF) 250 MCG/5ML IJ SOLN
INTRAMUSCULAR | Status: AC
  Filled 2018-12-23: qty 5

## 2018-12-23 SURGICAL SUPPLY — 40 items
APPLIER CLIP ROT 10 11.4 M/L (STAPLE) ×4
CANISTER SUCT 3000ML PPV (MISCELLANEOUS) ×4 IMPLANT
CHLORAPREP W/TINT 26 (MISCELLANEOUS) ×4 IMPLANT
CLIP APPLIE ROT 10 11.4 M/L (STAPLE) ×2 IMPLANT
COVER SURGICAL LIGHT HANDLE (MISCELLANEOUS) ×4 IMPLANT
COVER WAND RF STERILE (DRAPES) ×4 IMPLANT
DERMABOND ADVANCED (GAUZE/BANDAGES/DRESSINGS) ×2
DERMABOND ADVANCED .7 DNX12 (GAUZE/BANDAGES/DRESSINGS) ×2 IMPLANT
ELECT REM PT RETURN 9FT ADLT (ELECTROSURGICAL) ×4
ELECTRODE REM PT RTRN 9FT ADLT (ELECTROSURGICAL) ×2 IMPLANT
GLOVE BIOGEL PI IND STRL 6.5 (GLOVE) IMPLANT
GLOVE BIOGEL PI IND STRL 8 (GLOVE) ×2 IMPLANT
GLOVE BIOGEL PI INDICATOR 6.5 (GLOVE) ×4
GLOVE BIOGEL PI INDICATOR 8 (GLOVE) ×2
GLOVE SURG SS PI 6.5 STRL IVOR (GLOVE) ×8 IMPLANT
GLOVE SURG SS PI 8.0 STRL IVOR (GLOVE) ×2 IMPLANT
GOWN STRL REUS W/ TWL LRG LVL3 (GOWN DISPOSABLE) ×4 IMPLANT
GOWN STRL REUS W/ TWL XL LVL3 (GOWN DISPOSABLE) ×2 IMPLANT
GOWN STRL REUS W/TWL LRG LVL3 (GOWN DISPOSABLE) ×4
GOWN STRL REUS W/TWL XL LVL3 (GOWN DISPOSABLE) ×2
KIT BASIN OR (CUSTOM PROCEDURE TRAY) ×4 IMPLANT
KIT TURNOVER KIT B (KITS) ×4 IMPLANT
NS IRRIG 1000ML POUR BTL (IV SOLUTION) ×4 IMPLANT
PAD ARMBOARD 7.5X6 YLW CONV (MISCELLANEOUS) ×4 IMPLANT
POUCH RETRIEVAL ECOSAC 10 (ENDOMECHANICALS) ×2 IMPLANT
POUCH RETRIEVAL ECOSAC 10MM (ENDOMECHANICALS) ×2
SCISSORS LAP 5X35 DISP (ENDOMECHANICALS) ×4 IMPLANT
SET CHOLANGIOGRAPH 5 50 .035 (SET/KITS/TRAYS/PACK) ×4 IMPLANT
SET IRRIG TUBING LAPAROSCOPIC (IRRIGATION / IRRIGATOR) ×4 IMPLANT
SET TUBE SMOKE EVAC HIGH FLOW (TUBING) ×4 IMPLANT
SLEEVE ENDOPATH XCEL 5M (ENDOMECHANICALS) ×4 IMPLANT
SPECIMEN JAR SMALL (MISCELLANEOUS) ×4 IMPLANT
SUT MNCRL AB 4-0 PS2 18 (SUTURE) ×4 IMPLANT
SUT VICRYL 0 UR6 27IN ABS (SUTURE) ×2 IMPLANT
TOWEL GREEN STERILE FF (TOWEL DISPOSABLE) ×6 IMPLANT
TRAY LAPAROSCOPIC MC (CUSTOM PROCEDURE TRAY) ×4 IMPLANT
TROCAR XCEL BLUNT TIP 100MML (ENDOMECHANICALS) ×4 IMPLANT
TROCAR XCEL NON-BLD 11X100MML (ENDOMECHANICALS) ×4 IMPLANT
TROCAR XCEL NON-BLD 5MMX100MML (ENDOMECHANICALS) ×4 IMPLANT
WATER STERILE IRR 1000ML POUR (IV SOLUTION) ×4 IMPLANT

## 2018-12-23 NOTE — Anesthesia Procedure Notes (Signed)
Procedure Name: Intubation Date/Time: 12/23/2018 7:40 AM Performed by: Renato Shin, CRNA Pre-anesthesia Checklist: Patient identified, Emergency Drugs available, Suction available and Patient being monitored Patient Re-evaluated:Patient Re-evaluated prior to induction Oxygen Delivery Method: Circle system utilized Preoxygenation: Pre-oxygenation with 100% oxygen Induction Type: IV induction and Rapid sequence Laryngoscope Size: Miller and 2 Grade View: Grade I Tube type: Oral Tube size: 7.0 mm Number of attempts: 1 Airway Equipment and Method: Stylet and Oral airway Placement Confirmation: ETT inserted through vocal cords under direct vision,  positive ETCO2 and breath sounds checked- equal and bilateral Secured at: 21 cm Tube secured with: Tape Dental Injury: Teeth and Oropharynx as per pre-operative assessment

## 2018-12-23 NOTE — Progress Notes (Signed)
[facility] Progress Note  Name: Yvonne Powell MRN: [id number] DOB: [birth year]  Chief Complaint:  abd pain   Subjective:  Feels ok today -had her gallbladder taken out this morning.  She wants HD when needed and was told they were happy with her fistula.  States that she is urinating without difficulty.   Review of systems:  abd pain better Denies n/v No shortness of breath    Intake/Output Summary (Last 24 hours) at 12/23/2018 1217 Last data filed at 12/23/2018 0825 Gross per 24 hour  Intake 1618.86 ml  Output 2171 ml  Net -552.14 ml    Vitals:  Vitals:   12/23/18 0434 12/23/18 0858 12/23/18 0915 12/23/18 0930  BP: (!) 137/59 (!) 141/61 (!) 139/53 (!) 148/59  Pulse: 85 77 69 68  Resp: 19 12 16 14   Temp: 98.7 F (37.1 C) 97.7 F (36.5 C)  97.6 F (36.4 C)  TempSrc: Oral     SpO2: 95% 97% 93% 92%  Weight:      Height:         Physical Exam:  General adult female in bed in no acute distress HEENT normocephalic atraumatic extraocular movements intact sclera anicteric Neck supple trachea midline Lungs clear to auscultation bilaterally normal work of breathing at rest  Heart regular rate and rhythm no rubs or gallops appreciated Abdomen soft appropriately tender RUQ nondistended Extremities no edema  Psych normal mood and affect Access RUE AVF bruit and thrill   Medications reviewed   Labs:  BMP Latest Ref Rng & Units 12/23/2018 12/22/2018 12/21/2018  Glucose 70 - 99 mg/dL 113(H) 117(H) 128(H)  BUN 8 - 23 mg/dL 52(H) 56(H) 55(H)  Creatinine 0.44 - 1.00 mg/dL 4.59(H) 4.90(H) 4.06(H)  Sodium 135 - 145 mmol/L 137 137 139  Potassium 3.5 - 5.1 mmol/L 4.4 4.6 4.6  Chloride 98 - 111 mmol/L 110 111 114(H)  CO2 22 - 32 mmol/L 18(L) 19(L) 17(L)  Calcium 8.9 - 10.3 mg/dL 8.5(L) 8.5(L) 8.7(L)     Assessment/Plan:   # CKD stage V  - BL Cr reported as 3.8 per a 7/29 nephrology visit  - Has no acute indication for dialysis.  Has an AVF RUE with bruit and  thrill.  - Strict ins and outs  - Normal saline at 75 ml/hr x 1 liter  - Follow renal function post-operatively to watch for decline and indication to initiate HD   # Abdominal pain  - s/p laparascopic cholecystectomy 8/20 - s/p vascular surgery consult and arterial duplex negative for any arterial insufficiency and mesenteric venous thrombosis felt unlikely.    # Metabolic acidosis  - change bicarb to 1300 mg BID      # HTN  - on dilt and doxazosin and previously controlled  - follow trends  # anemia normocytic  - no acute indication for PRBC's - check iron stores   Claudia Desanctis, MD 12/23/2018 12:17 PM

## 2018-12-23 NOTE — Transfer of Care (Signed)
Immediate Anesthesia Transfer of Care Note  Patient: Yvonne Powell  Procedure(s) Performed: LAPAROSCOPIC CHOLECYSTECTOMY (N/A Abdomen)  Patient Location: PACU  Anesthesia Type:General  Level of Consciousness: awake, oriented and patient cooperative  Airway & Oxygen Therapy: Patient Spontanous Breathing and Patient connected to face mask oxygen  Post-op Assessment: Report given to RN and Post -op Vital signs reviewed and stable  Post vital signs: Reviewed and stable  Last Vitals:  Vitals Value Taken Time  BP 141/61 12/23/18 0858  Temp 36.5 C 12/23/18 0858  Pulse 72 12/23/18 0902  Resp 13 12/23/18 0902  SpO2 98 % 12/23/18 0902  Vitals shown include unvalidated device data.  Last Pain:  Vitals:   12/23/18 0434  TempSrc: Oral  PainSc:       Patients Stated Pain Goal: 3 (14/27/67 0110)  Complications: No apparent anesthesia complications

## 2018-12-23 NOTE — Plan of Care (Signed)
  Problem: Activity: Goal: Risk for activity intolerance will decrease Outcome: Progressing   

## 2018-12-23 NOTE — Progress Notes (Signed)
PROGRESS NOTE    Yvonne Powell  OIZ:[id number] DOB: 1929/02/05 DOA: 12/20/2018 PCP: Leeroy Cha, MD   Brief Narrative:  HPI on 12/20/2018 by Dr. Merton Border Yvonne Powell  is a 85 y.o. female, with past medical history significant for hypertension, chronic kidney disease stage V not on hemodialysis yet, history of DVTs and anemia presenting today with 1 day history of generalized abdominal pain that started early in the morning today.  No nausea vomiting or diarrhea, no history of blood in the stools.  No urinary symptoms Patient denies any cough or shortness of breath. Patient has history of advanced renal failure and had an AV fistula placed on the right in 2018. Work-up in the emergency room showed cholelithiasis with no evidence of cholecystitis since CT was done without contrast and some evidence of mesenteric ischemia versus infectious colitis.  Blood work is unremarkable, except for elevated creatinine at 4.29 hemoglobin of 10. General and vascular surgery evaluated patient in the emergency room and wanted to be admitted and observed at this time. Advised HIDA scan.  Interim history Patient admitted with abdominal pain.  CT showed evidence of mesenteric ischemia versus infectious colitis.  Abdominal ultrasound showed gallstones.  HIDA scan unremarkable.  General surgery and vascular surgery consulted and appreciated.  S/p laparoscopic cholecystectomy today.  Assessment & Plan   Intractable abdominal pain/ Acute cholecystitis  -Present on admission and still ongoing -CT abdomen: Mesenteric stranding along several loops of small bowel, appearance nonspecific could reflect inflammation versus ischemia.  Large conglomerate gallstones feeling much of the gallbladder, possibly with surrounding sludge. -Right upper quadrant ultrasound showed 5.6 cm gallstone -HIDA scan unremarkable -Patient was placed on IV antibiotics with ceftriaxone and Flagyl -Vascular surgery consulted and  appreciated for possible mesenteric ischemia however arterial duplex is negative for any arterial insufficiency and liver Doppler suggests a flow within the portal as well as veins making mesenteric venous thrombosis much less likely.  No role for vascular intervention at this time. -General surgery consulted and appreciated, s/p laparoscopic cholecystectomtoday 12/23/2018 -Continue pain control antiemetics as needed  Chronic kidney disease, stage V -Status post AV fistula placement 2018 -Nephrology consulted and appreciated -Creatinine 4.59 -Continue sodium bicarb tablets  Essential hypertension -Continue Cardizem, Cardura  Hypothyroidism -Continue Synthroid  DVT Prophylaxis Heparin  Code Status: Full  Family Communication: None at bedside  Disposition Plan: Admitted. Dispo likely home in 1-2 days  Consultants General surgery Vascular surgery  Procedures  Right upper quadrant ultrasound HIDA scan Laparoscopic cholecystectomy  Antibiotics   Anti-infectives (From admission, onward)   Start     Dose/Rate Route Frequency Ordered Stop   12/23/18 0600  ciprofloxacin (CIPRO) IVPB 400 mg  Status:  Discontinued     400 mg 200 mL/hr over 60 Minutes Intravenous On call to O.R. 12/22/18 1930 12/23/18 0952   12/20/18 1630  metroNIDAZOLE (FLAGYL) IVPB 500 mg  Status:  Discontinued     500 mg 100 mL/hr over 60 Minutes Intravenous Every 8 hours 12/20/18 1554 12/20/18 1607   12/20/18 1300  cefTRIAXone (ROCEPHIN) 2 g in sodium chloride 0.9 % 100 mL IVPB     2 g 200 mL/hr over 30 Minutes Intravenous Every 24 hours 12/20/18 1253     12/20/18 1245  metroNIDAZOLE (FLAGYL) IVPB 500 mg     500 mg 100 mL/hr over 60 Minutes Intravenous Every 8 hours 12/20/18 1244        Subjective:   Yvonne Powell seen and examined today.  Feels she needs to burp.  Has some abdominal discomfort. Denies current chest pain, shortness of breath, nausea, vomiting, dizziness, headache.  Objective:   Vitals:    12/23/18 0434 12/23/18 0858 12/23/18 0915 12/23/18 0930  BP: (!) 137/59 (!) 141/61 (!) 139/53 (!) 148/59  Pulse: 85 77 69 68  Resp: 19 12 16 14   Temp: 98.7 F (37.1 C) 97.7 F (36.5 C)  97.6 F (36.4 C)  TempSrc: Oral     SpO2: 95% 97% 93% 92%  Weight:      Height:        Intake/Output Summary (Last 24 hours) at 12/23/2018 1138 Last data filed at 12/23/2018 0825 Gross per 24 hour  Intake 1618.86 ml  Output 2421 ml  Net -802.14 ml   Filed Weights   12/20/18 0835 12/21/18 2118 12/22/18 2010  Weight: 57.6 kg 57.5 kg 62.5 kg   Exam  General: Well developed, well nourished, NAD, appears stated age  26: NCAT,mucous membranes moist.   Cardiovascular: S1 S2 auscultated, RRR, soft SEM  Respiratory: Clear to auscultation bilaterally with equal chest rise  Abdomen: deferred today due to recent surgery  Extremities: warm dry without cyanosis clubbing or edema  Neuro: AAOx3, nonfocal  Psych:Pleasant, appropriate mood and affect  Data Reviewed: I have personally reviewed following labs and imaging studies  CBC: Recent Labs  Lab 12/20/18 0835 12/22/18 0630 12/23/18 0348  WBC 5.7 10.2 7.5  NEUTROABS 3.7  --   --   HGB 10.0* 9.6* 9.0*  HCT 30.7* 29.7* 27.2*  MCV 97.8 99.3 96.8  PLT 189 176 259   Basic Metabolic Panel: Recent Labs  Lab 12/20/18 0835 12/21/18 0539 12/22/18 0630 12/23/18 0348  NA 140 139 137 137  K 4.5 4.6 4.6 4.4  CL 113* 114* 111 110  CO2 18* 17* 19* 18*  GLUCOSE 129* 128* 117* 113*  BUN 65* 55* 56* 52*  CREATININE 4.29* 4.06* 4.90* 4.59*  CALCIUM 9.2 8.7* 8.5* 8.5*  MG  --  2.2  --   --   PHOS  --   --  5.8*  --    GFR: Estimated Creatinine Clearance: 7 mL/min (A) (by C-G formula based on SCr of 4.59 mg/dL (H)). Liver Function Tests: Recent Labs  Lab 12/20/18 0835 12/22/18 0630  AST 17  --   ALT 15  --   ALKPHOS 50  --   BILITOT 0.7  --   PROT 6.2*  --   ALBUMIN 3.7 3.0*   Recent Labs  Lab 12/20/18 0835  LIPASE 35   No  results for input(s): AMMONIA in the last 168 hours. Coagulation Profile: No results for input(s): INR, PROTIME in the last 168 hours. Cardiac Enzymes: No results for input(s): CKTOTAL, CKMB, CKMBINDEX, TROPONINI in the last 168 hours. BNP (last 3 results) No results for input(s): PROBNP in the last 8760 hours. HbA1C: No results for input(s): HGBA1C in the last 72 hours. CBG: No results for input(s): GLUCAP in the last 168 hours. Lipid Profile: No results for input(s): CHOL, HDL, LDLCALC, TRIG, CHOLHDL, LDLDIRECT in the last 72 hours. Thyroid Function Tests: No results for input(s): TSH, T4TOTAL, FREET4, T3FREE, THYROIDAB in the last 72 hours. Anemia Panel: No results for input(s): VITAMINB12, FOLATE, FERRITIN, TIBC, IRON, RETICCTPCT in the last 72 hours. Urine analysis: No results found for: COLORURINE, APPEARANCEUR, LABSPEC, PHURINE, GLUCOSEU, HGBUR, BILIRUBINUR, KETONESUR, PROTEINUR, UROBILINOGEN, NITRITE, LEUKOCYTESUR Sepsis Labs: @LABRCNTIP (procalcitonin:4,lacticidven:4)  ) Recent Results (from the past 240 hour(s))  SARS Coronavirus 2 [facility] order, Performed in [facility] hospital  lab) Nasopharyngeal Nasopharyngeal Swab     Status: None   Collection Time: 12/20/18 11:41 AM   Specimen: Nasopharyngeal Swab  Result Value Ref Range Status   SARS Coronavirus 2 NEGATIVE NEGATIVE Final    Comment: (NOTE) If result is NEGATIVE SARS-CoV-2 target nucleic acids are NOT DETECTED. The SARS-CoV-2 RNA is generally detectable in upper and lower  respiratory specimens during the acute phase of infection. The lowest  concentration of SARS-CoV-2 viral copies this assay can detect is 250  copies / mL. A negative result does not preclude SARS-CoV-2 infection  and should not be used as the sole basis for treatment or other  patient management decisions.  A negative result may occur with  improper specimen collection / handling, submission of specimen other  than nasopharyngeal swab,  presence of viral mutation(s) within the  areas targeted by this assay, and inadequate number of viral copies  (<250 copies / mL). A negative result must be combined with clinical  observations, patient history, and epidemiological information. If result is POSITIVE SARS-CoV-2 target nucleic acids are DETECTED. The SARS-CoV-2 RNA is generally detectable in upper and lower  respiratory specimens dur ing the acute phase of infection.  Positive  results are indicative of active infection with SARS-CoV-2.  Clinical  correlation with patient history and other diagnostic information is  necessary to determine patient infection status.  Positive results do  not rule out bacterial infection or co-infection with other viruses. If result is PRESUMPTIVE POSTIVE SARS-CoV-2 nucleic acids MAY BE PRESENT.   A presumptive positive result was obtained on the submitted specimen  and confirmed on repeat testing.  While 2019 novel coronavirus  (SARS-CoV-2) nucleic acids may be present in the submitted sample  additional confirmatory testing may be necessary for epidemiological  and / or clinical management purposes  to differentiate between  SARS-CoV-2 and other Sarbecovirus currently known to infect humans.  If clinically indicated additional testing with an alternate test  methodology [phone]) is advised. The SARS-CoV-2 RNA is generally  detectable in upper and lower respiratory sp ecimens during the acute  phase of infection. The expected result is Negative. Fact Sheet for Patients:  StrictlyIdeas.no Fact Sheet for Healthcare Providers: BankingDealers.co.za This test is not yet approved or cleared by the [place] FDA and has been authorized for detection and/or diagnosis of SARS-CoV-2 by FDA under an Emergency Use Authorization (EUA).  This EUA will remain in effect (meaning this test can be used) for the duration of the COVID-19 declaration under  Section 564(b)(1) of the Act, 21 U.S.C. section 360bbb-3(b)(1), unless the authorization is terminated or revoked sooner. Performed at [facility], [place] [address], [place], [place] [address]       Radiology Studies: Dg Abd 1 View  Result Date: 12/22/2018 CLINICAL DATA:  Abdominal distension. EXAM: ABDOMEN - 1 VIEW COMPARISON:  None. FINDINGS: The bowel gas pattern is normal. No radio-opaque calculi or other significant radiographic abnormality are seen. IMPRESSION: No evidence of bowel obstruction or ileus. Electronically Signed   By: Marijo Conception M.D.   On: 12/22/2018 08:37   Nm Hepatobiliary Liver Func  Result Date: 12/21/2018 CLINICAL DATA:  Severe upper abdominal pain and bloating, cholelithiasis by CT EXAM: NUCLEAR MEDICINE HEPATOBILIARY IMAGING TECHNIQUE: Sequential images of the abdomen were obtained out to 60 minutes following intravenous administration of radiopharmaceutical. Patient was unable to perform assessment of gallbladder emptying following fatty meal stimulation due to severe pain and inability to drink ensure. RADIOPHARMACEUTICALS:  5.2 mCi  Tc-30m  Choletec IV COMPARISON:  None Correlation: CT abdomen and pelvis 12/20/2018 FINDINGS: Prompt tracer clearance from bloodstream indicating normal hepatocellular function. Prompt excretion of tracer into biliary tree. Small bowel visualized at 28 minutes. Beginning at 36 minutes, tracer is visualized within the gallbladder. No hepatic retention of tracer. IMPRESSION: Normal hepatocellular function. Patent cystic duct and common bile duct. No scintigraphic evidence of acute cholecystitis. Electronically Signed   By: Lavonia Dana M.D.   On: 12/21/2018 14:07   US Liver Doppler  Result Date: 12/21/2018 CLINICAL DATA:  85 year old female with possible cirrhosis EXAM: DUPLEX ULTRASOUND OF LIVER TECHNIQUE: Color and duplex Doppler ultrasound was performed to evaluate the hepatic in-flow and out-flow vessels. COMPARISON:  None.  FINDINGS: Portal Vein Velocities Main:  37 cm/sec Right:  29 cm/sec Left:  24 cm/sec Hepatic Vein Velocities Right:  42 cm/sec Middle:  41 cm/sec Left:  47 cm/sec Hepatic Artery Velocity:  55 cm/sec Splenic Vein Velocity:  31 cm/sec Varices: Absent Ascites: Small volume ascites of the abdomen. Spleen measures 7 cm x 8.4 cm x 4.8 cm with a volume of 145 cc Mild nodular contour of the liver surface on the high-resolution images. IMPRESSION: Unremarkable directed duplex of the hepatic vasculature. Evidence of early cirrhotic changes with scant ascites. Electronically Signed   By: Corrie Mckusick D.O.   On: 12/21/2018 12:40     Scheduled Meds: . calcitRIOL  0.25 mcg Oral 2 times weekly  . diltiazem  300 mg Oral Daily  . docusate sodium  100 mg Oral BID  . doxazosin  8 mg Oral Daily  . heparin  5,000 Units Subcutaneous Q8H  . levothyroxine  88 mcg Oral QAC breakfast  . sodium bicarbonate  650 mg Oral TID   Continuous Infusions: . sodium chloride 250 mL (12/22/18 0516)  . cefTRIAXone (ROCEPHIN)  IV 2 g (12/22/18 1358)  . metronidazole 500 mg (12/23/18 0526)     LOS: 3 days   Time Spent in minutes   30 minutes  Wanda Rideout D.O. on 12/23/2018 at 11:38 AM  Between 7am to 7pm - Please see pager noted on [email or web address]  After 7pm go to [email or web address]  And look for the night coverage person covering for me after hours  [facility] Office  [phone]

## 2018-12-23 NOTE — Interval H&P Note (Signed)
History and Physical Interval Note:  12/23/2018 7:20 AM  Yvonne Powell  has presented today for surgery, with the diagnosis of cholecystitis.  The various methods of treatment have been discussed with the patient and family. After consideration of risks, benefits and other options for treatment, the patient has consented to  Procedure(s): LAPAROSCOPIC CHOLECYSTECTOMY WITH POSSIBLE INTRAOPERATIVE CHOLANGIOGRAM (N/A) as a surgical intervention.  The patient's history has been reviewed, patient examined, no change in status, stable for surgery.  I have reviewed the patient's chart and labs.  Questions were answered to the patient's satisfaction.  The procedure has been discussed with the patient. Operative and non operative treatments have been discussed. Risks of surgery include bleeding, infection,  Common bile duct injury,  Injury to the stomach,liver, colon,small intestine, abdominal wall,  Diaphragm,  Major blood vessels,  And the need for an open procedure.  Other risks include worsening of medical problems, death,  DVT and pulmonary embolism, and cardiovascular events.   Medical options have also been discussed. The patient has been informed of long term expectations of surgery and non surgical options,  The patient agrees to proceed.     [place]

## 2018-12-23 NOTE — Op Note (Signed)
Laparoscopic Cholecystectomy  Procedure Note  Indications: This patient presents with symptomatic gallbladder disease and will undergo laparoscopic cholecystectomy.The procedure has been discussed with the patient. Operative and non operative treatments have been discussed. Risks of surgery include bleeding, infection,  Common bile duct injury,  Injury to the stomach,liver, colon,small intestine, abdominal wall,  Diaphragm,  Major blood vessels,  And the need for an open procedure.  Other risks include worsening of medical problems, death,  DVT and pulmonary embolism, and cardiovascular events.   Medical options have also been discussed. The patient has been informed of long term expectations of surgery and non surgical options,  The patient agrees to proceed.    Pre-operative Diagnosis: Calculus of gallbladder with acute cholecystitis, without mention of obstruction  Post-operative Diagnosis: Same with enteritis of small bowel   Surgeon: Turner Daniels MD   Assistants: NONE   Anesthesia: General endotracheal anesthesia and Local anesthesia 0.25.% bupivacaine, with epinephrine  ASA Class: 3  Procedure Details  The patient was seen again in the Holding Room. The risks, benefits, complications, treatment options, and expected outcomes were discussed with the patient. The possibilities of reaction to medication, pulmonary aspiration, perforation of viscus, bleeding, recurrent infection, finding a normal gallbladder, the need for additional procedures, failure to diagnose a condition, the possible need to convert to an open procedure, and creating a complication requiring transfusion or operation were discussed with the patient. The patient and/or family concurred with the proposed plan, giving informed consent. The site of surgery properly noted/marked. The patient was taken to Operating Room, identified as Yvonne Powell and the procedure verified as Laparoscopic Cholecystectomy with Intraoperative  Cholangiograms. A Time Out was held and the above information confirmed.  Prior to the induction of general anesthesia, antibiotic prophylaxis was administered. General endotracheal anesthesia was then administered and tolerated well. After the induction, the abdomen was prepped in the usual sterile fashion. The patient was positioned in the supine position with the left arm comfortably tucked, along with some reverse Trendelenburg.  Local anesthetic agent was injected into the skin near the umbilicus and an incision made. The midline fascia was incised and the Hasson technique was used to introduce a 12 mm port under direct vision. It was secured with a figure of eight Vicryl suture placed in the usual fashion. Pneumoperitoneum was then created with CO2 and tolerated well without any adverse changes in the patient's vital signs. Additional trocars were introduced under direct vision with an 11 mm trocar in the epigastrium and 2 5 mm trocars in the right upper quadrant. All skin incisions were infiltrated with a local anesthetic agent before making the incision and placing the trocars.    Upon inspection of the abdominal cavity.  She had signs of significant enteritis.  There is no obvious ischemic or dead bowel but there is some bloody ascites in the abdominal cavity.  The small bowel appeared mildly dilated with signs of enteritis without perforation.  I did not see any overtly ischemic bowel and this appeared to be resolving process.  No evidence of perforation.   The gallbladder was identified, the fundus grasped and retracted cephalad. Adhesions were lysed bluntly and with the electrocautery where indicated, taking care not to injure any adjacent organs or viscus. The infundibulum was grasped and retracted laterally, exposing the peritoneum overlying the triangle of Calot. This was then divided and exposed in a blunt fashion. The cystic duct was clearly identified and bluntly dissected  circumferentially. The junctions of the gallbladder, cystic  duct and common bile duct were clearly identified prior to the division of any linear structure.   The gallbladder was extremely friable.  It is very difficult to handle due to the acute cholecystitis.  We were able to dissect out the critical view.  Given the extremely fragile nature of her gallbladder, I opted to forego cholangiogram since the critical view was obtained.  The cystic duct was then  ligated with surgical clips  on the patient side and  clipped on the gallbladder side and divided. The cystic artery was identified, dissected free, ligated with clips and divided as well. Posterior cystic artery clipped and divided.  The gallbladder was dissected from the liver bed in retrograde fashion with the electrocautery. The gallbladder was removed. The liver bed was irrigated and inspected. Hemostasis was achieved with the electrocautery. Copious irrigation was utilized and was repeatedly aspirated until clear all particulate matter. Hemostasis was achieved with no signs  Of bleeding or bile leakage.  Pneumoperitoneum was completely reduced after viewing removal of the trocars under direct vision. The wound was thoroughly irrigated and the fascia was then closed with a figure of eight suture; the skin was then closed with 4-0 Monocryl and a sterile dressing was applied.  Instrument, sponge, and needle counts were correct at closure and at the conclusion of the case.   Findings: Cholecystitis with Cholelithiasis small bowel enteritis without perforation  Estimated Blood Loss: Minimal         Drains: None         Total IV Fluids: Per record         Specimens: Gallbladder           Complications: None; patient tolerated the procedure well.         Disposition: PACU - hemodynamically stable.         Condition: stable

## 2018-12-23 NOTE — Anesthesia Postprocedure Evaluation (Signed)
Anesthesia Post Note  Patient: Yvonne Powell  Procedure(s) Performed: LAPAROSCOPIC CHOLECYSTECTOMY (N/A Abdomen)     Patient location during evaluation: PACU Anesthesia Type: General Level of consciousness: awake and alert Pain management: pain level controlled Vital Signs Assessment: post-procedure vital signs reviewed and stable Respiratory status: spontaneous breathing, nonlabored ventilation and respiratory function stable Cardiovascular status: blood pressure returned to baseline and stable Postop Assessment: no apparent nausea or vomiting Anesthetic complications: no    Last Vitals:  Vitals:   12/23/18 0915 12/23/18 0930  BP: (!) 139/53 (!) 148/59  Pulse: 69 68  Resp: 16 14  Temp:  36.4 C  SpO2: 93% 92%    Last Pain:  Vitals:   12/23/18 0930  TempSrc:   PainSc: 3                  [name],[name]

## 2018-12-24 ENCOUNTER — Encounter (HOSPITAL_COMMUNITY): Payer: Self-pay | Admitting: Surgery

## 2018-12-24 LAB — BASIC METABOLIC PANEL
Anion gap: 10 (ref 5–15)
BUN: 47 mg/dL — ABNORMAL HIGH (ref 8–23)
CO2: 18 mmol/L — ABNORMAL LOW (ref 22–32)
Calcium: 8.8 mg/dL — ABNORMAL LOW (ref 8.9–10.3)
Chloride: 111 mmol/L (ref 98–111)
Creatinine, Ser: 4.38 mg/dL — ABNORMAL HIGH (ref 0.44–1.00)
GFR calc Af Amer: 10 mL/min — ABNORMAL LOW (ref >60)
GFR calc non Af Amer: 8 mL/min — ABNORMAL LOW (ref >60)
Glucose, Bld: 113 mg/dL — ABNORMAL HIGH (ref 70–99)
Potassium: 4.4 mmol/L (ref 3.5–5.1)
Sodium: 139 mmol/L (ref 135–145)

## 2018-12-24 LAB — CBC
HCT: 27.4 % — ABNORMAL LOW (ref 36.0–46.0)
Hemoglobin: 9.1 g/dL — ABNORMAL LOW (ref 12.0–15.0)
MCH: 31.9 pg (ref 26.0–34.0)
MCHC: 33.2 g/dL (ref 30.0–36.0)
MCV: 96.1 fL (ref 80.0–100.0)
Platelets: 181 10*3/uL (ref 150–400)
RBC: 2.85 MIL/uL — ABNORMAL LOW (ref 3.87–5.11)
RDW: 12.9 % (ref 11.5–15.5)
WBC: 8.6 10*3/uL (ref 4.0–10.5)
nRBC: 0 % (ref 0.0–0.2)

## 2018-12-24 MED ORDER — PROMETHAZINE HCL 25 MG/ML IJ SOLN
12.5000 mg | Freq: Four times a day (QID) | INTRAMUSCULAR | Status: DC | PRN
  Administered 2018-12-24 – 2018-12-26 (×2): 12.5 mg via INTRAVENOUS
  Filled 2018-12-24 (×2): qty 1

## 2018-12-24 MED ORDER — ACETAMINOPHEN 325 MG PO TABS
650.0000 mg | ORAL_TABLET | Freq: Four times a day (QID) | ORAL | Status: DC | PRN
  Administered 2018-12-25: 650 mg via ORAL
  Filled 2018-12-24: qty 2

## 2018-12-24 MED ORDER — OXYCODONE HCL 5 MG PO TABS
5.0000 mg | ORAL_TABLET | ORAL | Status: DC | PRN
  Filled 2018-12-24: qty 1

## 2018-12-24 MED ORDER — HYDRALAZINE HCL 25 MG PO TABS
25.0000 mg | ORAL_TABLET | Freq: Three times a day (TID) | ORAL | Status: DC
  Administered 2018-12-24 – 2018-12-28 (×12): 25 mg via ORAL
  Filled 2018-12-24 (×12): qty 1

## 2018-12-24 MED ORDER — METHOCARBAMOL 500 MG PO TABS: 500.0000 mg | ORAL_TABLET | Freq: Three times a day (TID) | ORAL | Status: DC | PRN

## 2018-12-24 MED ORDER — SODIUM CHLORIDE 0.9 % IV SOLN
510.0000 mg | Freq: Once | INTRAVENOUS | Status: AC
  Administered 2018-12-24: 510 mg via INTRAVENOUS
  Filled 2018-12-24: qty 17

## 2018-12-24 NOTE — Progress Notes (Signed)
PROGRESS NOTE    Yvonne Powell  IWL:[id number] DOB: Dec 09, 1928 DOA: 12/20/2018 PCP: Leeroy Cha, MD   Brief Narrative:  HPI on 12/20/2018 by Dr. Merton Border Yvonne Powell  is a 85 y.o. female, with past medical history significant for hypertension, chronic kidney disease stage V not on hemodialysis yet, history of DVTs and anemia presenting today with 1 day history of generalized abdominal pain that started early in the morning today.  No nausea vomiting or diarrhea, no history of blood in the stools.  No urinary symptoms Patient denies any cough or shortness of breath. Patient has history of advanced renal failure and had an AV fistula placed on the right in 2018. Work-up in the emergency room showed cholelithiasis with no evidence of cholecystitis since CT was done without contrast and some evidence of mesenteric ischemia versus infectious colitis.  Blood work is unremarkable, except for elevated creatinine at 4.29 hemoglobin of 10. General and vascular surgery evaluated patient in the emergency room and wanted to be admitted and observed at this time. Advised HIDA scan.  Interim history Patient admitted with abdominal pain.  CT showed evidence of mesenteric ischemia versus infectious colitis.  Abdominal ultrasound showed gallstones.  HIDA scan unremarkable.  General surgery and vascular surgery consulted and appreciated.  S/p laparoscopic cholecystectomy. Assessment & Plan   Intractable abdominal pain/ Acute cholecystitis  -Present on admission and still ongoing -CT abdomen: Mesenteric stranding along several loops of small bowel, appearance nonspecific could reflect inflammation versus ischemia.  Large conglomerate gallstones feeling much of the gallbladder, possibly with surrounding sludge. -Right upper quadrant ultrasound showed 5.6 cm gallstone -HIDA scan unremarkable -Patient was placed on IV antibiotics with ceftriaxone and Flagyl -Vascular surgery consulted and  appreciated for possible mesenteric ischemia however arterial duplex is negative for any arterial insufficiency and liver Doppler suggests a flow within the portal as well as veins making mesenteric venous thrombosis much less likely.  No role for vascular intervention at this time. -General surgery consulted and appreciated, s/p laparoscopic cholecystectomtoday 12/23/2018 -Continue pain control antiemetics as needed -patient with continued pain today- per surgery ?entertitis vs ileus  Chronic kidney disease, stage V -Status post AV fistula placement 2018 -Nephrology consulted and appreciated -Creatinine 4.59 -Continue sodium bicarb tablets  Essential hypertension -Continue Cardizem, Cardura  Hypothyroidism -Continue Synthroid  DVT Prophylaxis Heparin  Code Status: Full  Family Communication: None at bedside  Disposition Plan: Admitted. Dispo TBD  Consultants General surgery Vascular surgery  Procedures  Right upper quadrant ultrasound HIDA scan Laparoscopic cholecystectomy  Antibiotics   Anti-infectives (From admission, onward)   Start     Dose/Rate Route Frequency Ordered Stop   12/23/18 0600  ciprofloxacin (CIPRO) IVPB 400 mg  Status:  Discontinued     400 mg 200 mL/hr over 60 Minutes Intravenous On call to O.R. 12/22/18 1930 12/23/18 0952   12/20/18 1630  metroNIDAZOLE (FLAGYL) IVPB 500 mg  Status:  Discontinued     500 mg 100 mL/hr over 60 Minutes Intravenous Every 8 hours 12/20/18 1554 12/20/18 1607   12/20/18 1300  cefTRIAXone (ROCEPHIN) 2 g in sodium chloride 0.9 % 100 mL IVPB     2 g 200 mL/hr over 30 Minutes Intravenous Every 24 hours 12/20/18 1253     12/20/18 1245  metroNIDAZOLE (FLAGYL) IVPB 500 mg     500 mg 100 mL/hr over 60 Minutes Intravenous Every 8 hours 12/20/18 1244        Subjective:   Yvonne Powell seen and examined today.  Continues  to have abdominal pain and nausea this morning.  Feels that the Zofran is not helping.  Also had one episode  of vomiting.  States that she did have a bowel movement yesterday.  She has not had any gas passage or bowel movement today.  Continues to have pain around her incision.  Denies current chest pain, shortness of breath, dizziness or headache.  Objective:   Vitals:   12/23/18 0915 12/23/18 0930 12/23/18 2011 12/24/18 0426  BP: (!) 139/53 (!) 148/59 (!) 165/77 (!) 161/81  Pulse: 69 68 93 87  Resp: 16 14 19 19   Temp:  97.6 F (36.4 C) 98.2 F (36.8 C) 98.4 F (36.9 C)  TempSrc:   Oral Oral  SpO2: 93% 92% 95% 95%  Weight:   62 kg   Height:        Intake/Output Summary (Last 24 hours) at 12/24/2018 8119 Last data filed at 12/24/2018 0700 Gross per 24 hour  Intake 1050 ml  Output 250 ml  Net 800 ml   Filed Weights   12/21/18 2118 12/22/18 2010 12/23/18 2011  Weight: 57.5 kg 62.5 kg 62 kg   Exam  General: Well developed, well nourished, NAD, appears stated age  50: NCAT, mucous membranes moist.   Neck: Supple  Cardiovascular: S1 S2 auscultated, soft SEM, RRR  Respiratory: Clear to auscultation bilaterally  Abdomen: Soft, nontender, nondistended, + bowel sounds  Extremities: warm dry without cyanosis clubbing or edema  Neuro: AAOx3, nonfocal  Psych: pleasant, appropriate mood and affect  Data Reviewed: I have personally reviewed following labs and imaging studies  CBC: Recent Labs  Lab 12/20/18 0835 12/22/18 0630 12/23/18 0348 12/24/18 0706  WBC 5.7 10.2 7.5 8.6  NEUTROABS 3.7  --   --   --   HGB 10.0* 9.6* 9.0* 9.1*  HCT 30.7* 29.7* 27.2* 27.4*  MCV 97.8 99.3 96.8 96.1  PLT 189 176 172 147   Basic Metabolic Panel: Recent Labs  Lab 12/20/18 0835 12/21/18 0539 12/22/18 0630 12/23/18 0348 12/24/18 0706  NA 140 139 137 137 139  K 4.5 4.6 4.6 4.4 4.4  CL 113* 114* 111 110 111  CO2 18* 17* 19* 18* 18*  GLUCOSE 129* 128* 117* 113* 113*  BUN 65* 55* 56* 52* 47*  CREATININE 4.29* 4.06* 4.90* 4.59* 4.38*  CALCIUM 9.2 8.7* 8.5* 8.5* 8.8*  MG  --  2.2   --   --   --   PHOS  --   --  5.8*  --   --    GFR: Estimated Creatinine Clearance: 7.3 mL/min (A) (by C-G formula based on SCr of 4.38 mg/dL (H)). Liver Function Tests: Recent Labs  Lab 12/20/18 0835 12/22/18 0630  AST 17  --   ALT 15  --   ALKPHOS 50  --   BILITOT 0.7  --   PROT 6.2*  --   ALBUMIN 3.7 3.0*   Recent Labs  Lab 12/20/18 0835  LIPASE 35   No results for input(s): AMMONIA in the last 168 hours. Coagulation Profile: No results for input(s): INR, PROTIME in the last 168 hours. Cardiac Enzymes: No results for input(s): CKTOTAL, CKMB, CKMBINDEX, TROPONINI in the last 168 hours. BNP (last 3 results) No results for input(s): PROBNP in the last 8760 hours. HbA1C: No results for input(s): HGBA1C in the last 72 hours. CBG: No results for input(s): GLUCAP in the last 168 hours. Lipid Profile: No results for input(s): CHOL, HDL, LDLCALC, TRIG, CHOLHDL, LDLDIRECT in the last  72 hours. Thyroid Function Tests: No results for input(s): TSH, T4TOTAL, FREET4, T3FREE, THYROIDAB in the last 72 hours. Anemia Panel: Recent Labs    12/23/18 1335  FERRITIN 987*  TIBC 165*  IRON 39   Urine analysis: No results found for: COLORURINE, APPEARANCEUR, LABSPEC, PHURINE, GLUCOSEU, HGBUR, BILIRUBINUR, KETONESUR, PROTEINUR, UROBILINOGEN, NITRITE, LEUKOCYTESUR Sepsis Labs: @LABRCNTIP (procalcitonin:4,lacticidven:4)  ) Recent Results (from the past 240 hour(s))  SARS Coronavirus 2 [facility] order, Performed in [facility] hospital lab) Nasopharyngeal Nasopharyngeal Swab     Status: None   Collection Time: 12/20/18 11:41 AM   Specimen: Nasopharyngeal Swab  Result Value Ref Range Status   SARS Coronavirus 2 NEGATIVE NEGATIVE Final    Comment: (NOTE) If result is NEGATIVE SARS-CoV-2 target nucleic acids are NOT DETECTED. The SARS-CoV-2 RNA is generally detectable in upper and lower  respiratory specimens during the acute phase of infection. The lowest  concentration of SARS-CoV-2  viral copies this assay can detect is 250  copies / mL. A negative result does not preclude SARS-CoV-2 infection  and should not be used as the sole basis for treatment or other  patient management decisions.  A negative result may occur with  improper specimen collection / handling, submission of specimen other  than nasopharyngeal swab, presence of viral mutation(s) within the  areas targeted by this assay, and inadequate number of viral copies  (<250 copies / mL). A negative result must be combined with clinical  observations, patient history, and epidemiological information. If result is POSITIVE SARS-CoV-2 target nucleic acids are DETECTED. The SARS-CoV-2 RNA is generally detectable in upper and lower  respiratory specimens dur ing the acute phase of infection.  Positive  results are indicative of active infection with SARS-CoV-2.  Clinical  correlation with patient history and other diagnostic information is  necessary to determine patient infection status.  Positive results do  not rule out bacterial infection or co-infection with other viruses. If result is PRESUMPTIVE POSTIVE SARS-CoV-2 nucleic acids MAY BE PRESENT.   A presumptive positive result was obtained on the submitted specimen  and confirmed on repeat testing.  While 2019 novel coronavirus  (SARS-CoV-2) nucleic acids may be present in the submitted sample  additional confirmatory testing may be necessary for epidemiological  and / or clinical management purposes  to differentiate between  SARS-CoV-2 and other Sarbecovirus currently known to infect humans.  If clinically indicated additional testing with an alternate test  methodology [phone]) is advised. The SARS-CoV-2 RNA is generally  detectable in upper and lower respiratory sp ecimens during the acute  phase of infection. The expected result is Negative. Fact Sheet for Patients:  StrictlyIdeas.no Fact Sheet for Healthcare Providers:  BankingDealers.co.za This test is not yet approved or cleared by the [place] FDA and has been authorized for detection and/or diagnosis of SARS-CoV-2 by FDA under an Emergency Use Authorization (EUA).  This EUA will remain in effect (meaning this test can be used) for the duration of the COVID-19 declaration under Section 564(b)(1) of the Act, 21 U.S.C. section 360bbb-3(b)(1), unless the authorization is terminated or revoked sooner. Performed at [facility], [place] [address], [place], [place] [address]       Radiology Studies: No results found.   Scheduled Meds: . calcitRIOL  0.25 mcg Oral 2 times weekly  . diltiazem  300 mg Oral Daily  . docusate sodium  100 mg Oral BID  . doxazosin  8 mg Oral Daily  . heparin  5,000 Units Subcutaneous Q8H  .  levothyroxine  88 mcg Oral QAC breakfast   Continuous Infusions: . sodium chloride 250 mL (12/22/18 0516)  . cefTRIAXone (ROCEPHIN)  IV 2 g (12/23/18 1337)  . ferumoxytol 510 mg (12/24/18 0946)  . metronidazole 500 mg (12/24/18 0634)     LOS: 4 days   Time Spent in minutes   30 minutes  Aziya Arena D.O. on 12/24/2018 at 9:52 AM  Between 7am to 7pm - Please see pager noted on [email or web address]  After 7pm go to [email or web address]  And look for the night coverage person covering for me after hours  [facility] Office  [phone]

## 2018-12-24 NOTE — Progress Notes (Signed)
[facility] Progress Note  1 Day Post-Op  Subjective: CC-  Main complaint this morning is nausea and 1 episode of emesis. She did have a bowel movement yesterday, but feels bloated and is not passing flatus today. She reports only mild abdominal pain today, and most of her pain is around the periumbilical incision. Sipping on clear liquids.  Objective: Vital signs in last 24 hours: Temp:  [97.6 F (36.4 C)-98.4 F (36.9 C)] 98.4 F (36.9 C) (08/21 0426) Pulse Rate:  [68-93] 87 (08/21 0426) Resp:  [12-19] 19 (08/21 0426) BP: (139-165)/(53-81) 161/81 (08/21 0426) SpO2:  [92 %-97 %] 95 % (08/21 0426) Weight:  [62 kg] 62 kg (08/20 2011) Last BM Date: 12/23/18  Intake/Output from previous day: 08/20 0701 - 08/21 0700 In: 1550 [P.O.:600; I.V.:650; IV Piggyback:300] Out: 670 [Urine:250; Blood:20] Intake/Output this shift: No intake/output data recorded.  PE: Gen:  Alert, NAD, pleasant HEENT: EOM's intact, pupils equal and round Pulm:  Rate and effort normal Abd: Soft, distended, few BS, only tender over incisions/ especially the periumbilical incision, incisions cdi without erythema or drainage Skin: no rashes noted, warm and dry  Lab Results:  Recent Labs    12/23/18 0348 12/24/18 0706  WBC 7.5 8.6  HGB 9.0* 9.1*  HCT 27.2* 27.4*  PLT 172 181   BMET Recent Labs    12/23/18 0348 12/24/18 0706  NA 137 139  K 4.4 4.4  CL 110 111  CO2 18* 18*  GLUCOSE 113* 113*  BUN 52* 47*  CREATININE 4.59* 4.38*  CALCIUM 8.5* 8.8*   PT/INR No results for input(s): LABPROT, INR in the last 72 hours. CMP     Component Value Date/Time   NA 139 12/24/2018 0706   K 4.4 12/24/2018 0706   CL 111 12/24/2018 0706   CO2 18 (L) 12/24/2018 0706   GLUCOSE 113 (H) 12/24/2018 0706   BUN 47 (H) 12/24/2018 0706   CREATININE 4.38 (H) 12/24/2018 0706   CALCIUM 8.8 (L) 12/24/2018 0706   PROT 6.2 (L) 12/20/2018 0835   ALBUMIN 3.0 (L) 12/22/2018 0630   AST 17 12/20/2018 0835    ALT 15 12/20/2018 0835   ALKPHOS 50 12/20/2018 0835   BILITOT 0.7 12/20/2018 0835   GFRNONAA 8 (L) 12/24/2018 0706   GFRAA 10 (L) 12/24/2018 0706   Lipase     Component Value Date/Time   LIPASE 35 12/20/2018 0835       Studies/Results: No results found.  Anti-infectives: Anti-infectives (From admission, onward)   Start     Dose/Rate Route Frequency Ordered Stop   12/23/18 0600  ciprofloxacin (CIPRO) IVPB 400 mg  Status:  Discontinued     400 mg 200 mL/hr over 60 Minutes Intravenous On call to O.R. 12/22/18 1930 12/23/18 0952   12/20/18 1630  metroNIDAZOLE (FLAGYL) IVPB 500 mg  Status:  Discontinued     500 mg 100 mL/hr over 60 Minutes Intravenous Every 8 hours 12/20/18 1554 12/20/18 1607   12/20/18 1300  cefTRIAXone (ROCEPHIN) 2 g in sodium chloride 0.9 % 100 mL IVPB     2 g 200 mL/hr over 30 Minutes Intravenous Every 24 hours 12/20/18 1253     12/20/18 1245  metroNIDAZOLE (FLAGYL) IVPB 500 mg     500 mg 100 mL/hr over 60 Minutes Intravenous Every 8 hours 12/20/18 1244         Assessment/Plan Hypertension AKI on CKD -previous right arm AV fistula creation but has never been on dialysis - creatinine 4.38. nephrology  following COVID negative(8/17)  Upper abdominal pain  - Cholelithiasis/possible cholecystitis - HIDAnegative - arterial duplex negative for any arterial insufficiency and liver Doppler suggests flow within the portal and splenic veins making mesenteric venous thrombosis much less likely - vascular has signed off -5.6 cm gallbladder stone, otherwise normal GB; CBD 5 mm  Calculus of gallbladder with acute cholecystitis, without mention of obstruction S/p laparoscopic cholecystectomy 8/20 Dr. Brantley Stage - POD #1 - intraoperative: noted to have some enteritis of small bowel - Abdominal pain improved/ different than prior to surgery, but patient is distended and nauseated today. Could be ileus, could be enteritis seen intraop. Continue just sips of  clears today as tolerated. Mobilize. zofran and phenergan ordered PRN nausea. Labs in AM.   FEN: IVF, CLD ID: [email or web address] 8/17 >>Day 4 DVT: sq heparin Follow up: TBD   LOS: 4 days    Wellington Hampshire , [facility] 12/24/2018, 8:49 AM Pager: [phone] Mon-Thurs 7:00 am-4:30 pm Fri 7:00 am -11:30 AM Sat-Sun 7:00 am-11:30 am

## 2018-12-24 NOTE — Progress Notes (Signed)
[facility] Progress Note  Name: Yvonne Powell MRN: [id number] DOB: March 19, 1929  Chief Complaint:  abd pain   Subjective:  Strict ins/outs not available but patient states urinating into hat with staff assistance and urinating normal amount without difficulty.  At home her BP is 140-150's often.  Doesn't like the bicarb tablets - uses baking soda at home.   Review of systems:   Had nausea with vomiting this morning  abd pain better No shortness of breath  No chest pain   Intake/Output Summary (Last 24 hours) at 12/24/2018 0838 Last data filed at 12/24/2018 0700 Gross per 24 hour  Intake 1050 ml  Output 250 ml  Net 800 ml    Vitals:  Vitals:   12/23/18 0915 12/23/18 0930 12/23/18 2011 12/24/18 0426  BP: (!) 139/53 (!) 148/59 (!) 165/77 (!) 161/81  Pulse: 69 68 93 87  Resp: 16 14 19 19   Temp:  97.6 F (36.4 C) 98.2 F (36.8 C) 98.4 F (36.9 C)  TempSrc:   Oral Oral  SpO2: 93% 92% 95% 95%  Weight:   62 kg   Height:         Physical Exam:   General adult female in bed in mild distress 2/2 nausea HEENT normocephalic atraumatic extraocular movements intact sclera anicteric Neck supple trachea midline Lungs clear to auscultation bilaterally normal work of breathing at rest  Heart RRR no rub Abdomen soft appropriately tender RUQ nondistended Extremities no edema  Psych normal mood and affect Access RUE AVF bruit and thrill   Medications reviewed   Labs:  BMP Latest Ref Rng & Units 12/24/2018 12/23/2018 12/22/2018  Glucose 70 - 99 mg/dL 113(H) 113(H) 117(H)  BUN 8 - 23 mg/dL 47(H) 52(H) 56(H)  Creatinine 0.44 - 1.00 mg/dL 4.38(H) 4.59(H) 4.90(H)  Sodium 135 - 145 mmol/L 139 137 137  Potassium 3.5 - 5.1 mmol/L 4.4 4.4 4.6  Chloride 98 - 111 mmol/L 111 110 111  CO2 22 - 32 mmol/L 18(L) 18(L) 19(L)  Calcium 8.9 - 10.3 mg/dL 8.8(L) 8.5(L) 8.5(L)     Assessment/Plan:   # CKD stage V  - BL Cr reported as 3.8 per a 7/29 nephrology visit  - Stabilized  and creatinine improving.  Has no acute indication for dialysis.  Has an AVF RUE with bruit and thrill.  - Strict ins and outs   # Abdominal pain  - s/p laparascopic cholecystectomy 8/20 - s/p vascular surgery consult and arterial duplex negative for any arterial insufficiency and mesenteric venous thrombosis felt unlikely.    # Metabolic acidosis  - patient not tolerating oral bicarb tablets      - Can resume prior home regimen on discharge  # HTN  - previously controlled on home regimen  - now s/p salt load and post-op - stopping PO bicarb and defer additional saline today - Trend blood pressure and will plan to add hydralazine if persistent   # anemia normocytic  - no acute indication for PRBC's - Feraheme x 1 on 8/21   # Secondary hyperpara - on calcitriol per outpatient regimen   Claudia Desanctis, MD 12/24/2018 8:38 AM

## 2018-12-24 NOTE — Plan of Care (Signed)
  Problem: Education: Goal: Knowledge of General Education information will improve Description: Including pain rating scale, medication(s)/side effects and non-pharmacologic comfort measures Outcome: Progressing   Problem: Pain Managment: Goal: General experience of comfort will improve Outcome: Progressing   

## 2018-12-24 NOTE — Plan of Care (Signed)
  Problem: Education: Goal: Knowledge of General Education information will improve Description Including pain rating scale, medication(s)/side effects and non-pharmacologic comfort measures Outcome: Progressing   

## 2018-12-24 NOTE — Care Management Important Message (Signed)
Important Message  Patient Details  Name: Yvonne Powell MRN: [id number] Date of Birth: 1928/06/16   Medicare Important Message Given:  Yes     Yvonne Powell [name] 12/24/2018, 4:23 PM

## 2018-12-24 NOTE — Progress Notes (Signed)
PT Cancellation Note  Patient Details Name: Yvonne Powell MRN: [id number] DOB: [birth year]   Cancelled Treatment:    Reason Eval/Treat Not Completed: Medical issues which prohibited therapy.  Severe nausea, asking for more meds.  Follow up as pt and time allow.   Ramond Dial 12/24/2018, 11:15 AM   Mee Hives, PT MS Acute Rehab Dept. Number: [place]

## 2018-12-24 NOTE — Evaluation (Signed)
Physical Therapy Evaluation Patient Details Name: Yvonne Powell MRN: [id number] DOB: 09-13-1928 Today's Date: 12/24/2018   History of Present Illness  85 yo female with onset of abd pain was admitted and noted ascites and early cirrhosis was noted to have Calculus of gallbladder with acute cholecystitis. Had laparoscopic surgery for cholecystectomy with small bowel enteritis found as well.  PMHx:  anemia, DVT, HTN, renal disorder, graves disease, HD for ESRD, lumbar epidural, hypothyroidism    Clinical Impression  Pt was seen for mobility and note her minor LOB at times with no complete loss, able to capture control.  However, would benefit from a SPC to increase control of balance, and to support mobility at home.  Recommending HHPT as well for work on recovery of her tolerance for movement, strength and safety to avoid a fall risk.  See her acutely for these same goals.    Follow Up Recommendations Home health PT    Equipment Recommendations  Cane    Recommendations for Other Services       Precautions / Restrictions Precautions Precautions: Fall Precaution Comments: may need SPC Restrictions Weight Bearing Restrictions: No      Mobility  Bed Mobility Overal bed mobility: Modified Independent                Transfers Overall transfer level: Needs assistance Equipment used: None Transfers: Sit to/from Stand Sit to Stand: Supervision         General transfer comment: sit to stand with supervised help  Ambulation/Gait Ambulation/Gait assistance: Min guard Gait Distance (Feet): 200 Feet Assistive device: None Gait Pattern/deviations: Step-through pattern;Shuffle;Wide base of support;Narrow base of support;Drifts right/left Gait velocity: reduced Gait velocity interpretation: <1.31 ft/sec, indicative of household ambulator General Gait Details: pt has a few steps to catch herself esp at end of walk  Stairs Stairs: Yes Stairs assistance: Min guard Stair  Management: One rail Right;Forwards;Alternating pattern;Step to pattern Number of Stairs: 6 General stair comments: pt was tired from walk and limited stairs to decrease opportunity to make her nauseated  Wheelchair Mobility    Modified Rankin (Stroke Patients Only)       Balance Overall balance assessment: Needs assistance Sitting-balance support: Feet supported Sitting balance-Leahy Scale: Fair     Standing balance support: No upper extremity supported Standing balance-Leahy Scale: Fair Standing balance comment: occas minor LOB with gait                             Pertinent Vitals/Pain Pain Assessment: Faces Faces Pain Scale: Hurts little more Pain Location: abdomen and nausea symptoms Pain Descriptors / Indicators: Operative site guarding Pain Intervention(s): Limited activity within patient's tolerance;Monitored during session;Premedicated before session;Repositioned    Home Living Family/patient expects to be discharged to:: Private residence Living Arrangements: Children Available Help at Discharge: Family;Available 24 hours/day Type of Home: House Home Access: Stairs to enter Entrance Stairs-Rails: Can reach both;Left;Right Entrance Stairs-Number of Steps: 8 Home Layout: One level Home Equipment: None      Prior Function Level of Independence: Independent               Hand Dominance   Dominant Hand: Right    Extremity/Trunk Assessment   Upper Extremity Assessment Upper Extremity Assessment: Overall WFL for tasks assessed    Lower Extremity Assessment Lower Extremity Assessment: Generalized weakness    Cervical / Trunk Assessment Cervical / Trunk Assessment: Kyphotic  Communication   Communication: No difficulties  Cognition Arousal/Alertness: Awake/alert  Behavior During Therapy: WFL for tasks assessed/performed Overall Cognitive Status: Within Functional Limits for tasks assessed                                         General Comments General comments (skin integrity, edema, etc.): pt is up to walk with min guard for safety, but reaches to wall rail at times and may need [facility]    Exercises     Assessment/Plan    PT Assessment Patient needs continued PT services  PT Problem List Decreased strength;Decreased range of motion;Decreased activity tolerance;Decreased balance;Decreased mobility;Decreased coordination;Decreased knowledge of use of DME;Pain;Decreased skin integrity       PT Treatment Interventions DME instruction;Gait training;Stair training;Functional mobility training;Therapeutic activities;Therapeutic exercise;Balance training;Neuromuscular re-education;Patient/family education    PT Goals (Current goals can be found in the Care Plan section)  Acute Rehab PT Goals Patient Stated Goal: to get home with son PT Goal Formulation: With patient Time For Goal Achievement: 01/07/19 Potential to Achieve Goals: Good    Frequency Min 3X/week   Barriers to discharge Inaccessible home environment 8 stairs to enter house    Co-evaluation               AM-PAC PT "6 Clicks" Mobility  Outcome Measure Help needed turning from your back to your side while in a flat bed without using bedrails?: None Help needed moving from lying on your back to sitting on the side of a flat bed without using bedrails?: A Little Help needed moving to and from a bed to a chair (including a wheelchair)?: A Little Help needed standing up from a chair using your arms (e.g., wheelchair or bedside chair)?: A Little Help needed to walk in hospital room?: A Little Help needed climbing 3-5 steps with a railing? : A Little 6 Click Score: 19    End of Session Equipment Utilized During Treatment: Gait belt Activity Tolerance: Patient limited by fatigue;Treatment limited secondary to medical complications (Comment) Patient left: in bed;with call bell/phone within reach Nurse Communication: Mobility status PT  Visit Diagnosis: Unsteadiness on feet (R26.81);Muscle weakness (generalized) (M62.81);Difficulty in walking, not elsewhere classified (R26.2)    Time: 1715-1740 PT Time Calculation (min) (ACUTE ONLY): 25 min   Charges:   PT Evaluation $PT Eval Moderate Complexity: 1 Mod PT Treatments $Gait Training: 8-22 mins       Ramond Dial 12/24/2018, 6:35 PM   Mee Hives, PT MS Acute Rehab Dept. Number: [place] [facility] [place]

## 2018-12-24 NOTE — Plan of Care (Signed)
  Problem: Clinical Measurements: Goal: Ability to maintain clinical measurements within normal limits will improve Outcome: Progressing   Problem: Clinical Measurements: Goal: Will remain free from infection Outcome: Progressing   

## 2018-12-25 ENCOUNTER — Inpatient Hospital Stay (HOSPITAL_COMMUNITY): Payer: Medicare Other

## 2018-12-25 LAB — CBC
HCT: 27.4 % — ABNORMAL LOW (ref 36.0–46.0)
Hemoglobin: 8.8 g/dL — ABNORMAL LOW (ref 12.0–15.0)
MCH: 32 pg (ref 26.0–34.0)
MCHC: 32.1 g/dL (ref 30.0–36.0)
MCV: 99.6 fL (ref 80.0–100.0)
Platelets: 184 10*3/uL (ref 150–400)
RBC: 2.75 MIL/uL — ABNORMAL LOW (ref 3.87–5.11)
RDW: 13 % (ref 11.5–15.5)
WBC: 7.7 10*3/uL (ref 4.0–10.5)
nRBC: 0 % (ref 0.0–0.2)

## 2018-12-25 LAB — BASIC METABOLIC PANEL
Anion gap: 11 (ref 5–15)
BUN: 45 mg/dL — ABNORMAL HIGH (ref 8–23)
CO2: 18 mmol/L — ABNORMAL LOW (ref 22–32)
Calcium: 8.6 mg/dL — ABNORMAL LOW (ref 8.9–10.3)
Chloride: 113 mmol/L — ABNORMAL HIGH (ref 98–111)
Creatinine, Ser: 4.16 mg/dL — ABNORMAL HIGH (ref 0.44–1.00)
GFR calc Af Amer: 10 mL/min — ABNORMAL LOW (ref >60)
GFR calc non Af Amer: 9 mL/min — ABNORMAL LOW (ref >60)
Glucose, Bld: 89 mg/dL (ref 70–99)
Potassium: 4.5 mmol/L (ref 3.5–5.1)
Sodium: 142 mmol/L (ref 135–145)

## 2018-12-25 NOTE — Progress Notes (Signed)
[facility] Progress Note  Name: Yvonne Powell MRN: [id number] DOB: [birth year]  Chief Complaint:  abd pain   Subjective:  States that she has felt better today.  She was hypertensive yesterday and hydralazine was added last night.  Note surgery plans for reimaging tomorrow and clears today.   Review of systems:   Nausea with vomiting yesterday - none today  abd pain better No shortness of breath  No chest pain   Intake/Output Summary (Last 24 hours) at 12/25/2018 1104 Last data filed at 12/25/2018 0600 Gross per 24 hour  Intake 174.91 ml  Output 800 ml  Net -625.09 ml    Vitals:  Vitals:   12/24/18 1022 12/24/18 1714 12/24/18 1952 12/25/18 0346  BP: (!) 194/84 (!) 191/80 (!) 189/74 (!) 180/67  Pulse: 83 84 90 84  Resp: 18 18 18 18   Temp: 98.8 F (37.1 C) 98.4 F (36.9 C) 99.9 F (37.7 C) 98.8 F (37.1 C)  TempSrc: Oral Oral Oral Oral  SpO2: 94% 93% 93% 95%  Weight:    62.2 kg  Height:         Physical Exam:   General adult female in bed in no acute distress HEENT normocephalic atraumatic extraocular movements intact sclera anicteric Neck supple trachea midline Lungs clear to auscultation bilaterally normal work of breathing at rest  Heart RRR no rub Abdomen soft NT ND Extremities no edema  Psych normal mood and affect Access RUE AVF bruit and thrill   Medications reviewed   Labs:  BMP Latest Ref Rng & Units 12/25/2018 12/24/2018 12/23/2018  Glucose 70 - 99 mg/dL 89 113(H) 113(H)  BUN 8 - 23 mg/dL 45(H) 47(H) 52(H)  Creatinine 0.44 - 1.00 mg/dL 4.16(H) 4.38(H) 4.59(H)  Sodium 135 - 145 mmol/L 142 139 137  Potassium 3.5 - 5.1 mmol/L 4.5 4.4 4.4  Chloride 98 - 111 mmol/L 113(H) 111 110  CO2 22 - 32 mmol/L 18(L) 18(L) 18(L)  Calcium 8.9 - 10.3 mg/dL 8.6(L) 8.8(L) 8.5(L)     Assessment/Plan:   # CKD stage V  - BL Cr reported as 3.8 per a 7/29 nephrology visit  - Stabilized and creatinine improving to near her baseline.  Has no acute  indication for dialysis.  Has an AVF RUE with bruit and thrill.   # Abdominal pain  - s/p laparascopic cholecystectomy 8/20 - s/p vascular surgery consult and arterial duplex negative for any arterial insufficiency and mesenteric venous thrombosis felt unlikely.    # Metabolic acidosis  - patient not tolerating oral bicarb tablets      - Can resume prior home regimen on discharge  # HTN  - previously controlled on home regimen  - now s/p salt load and post-op - Continue hydralazine (new addition as of 8/21 PM); improved to 170/72 on my exam   # anemia normocytic  - no acute indication for PRBC's - Feraheme x 1 on 8/21   # Secondary hyperpara - on calcitriol per outpatient regimen   Claudia Desanctis, MD 12/25/2018 11:04 AM

## 2018-12-25 NOTE — Progress Notes (Signed)
PROGRESS NOTE    Yvonne Powell  LGX:[id number] DOB: [birth year] DOA: 12/20/2018 PCP: Leeroy Cha, MD   Brief Narrative:  HPI on 12/20/2018 by Dr. Merton Border Yvonne Powell  is a 85 y.o. female, with past medical history significant for hypertension, chronic kidney disease stage V not on hemodialysis yet, history of DVTs and anemia presenting today with 1 day history of generalized abdominal pain that started early in the morning today.  No nausea vomiting or diarrhea, no history of blood in the stools.  No urinary symptoms Patient denies any cough or shortness of breath. Patient has history of advanced renal failure and had an AV fistula placed on the right in 2018. Work-up in the emergency room showed cholelithiasis with no evidence of cholecystitis since CT was done without contrast and some evidence of mesenteric ischemia versus infectious colitis.  Blood work is unremarkable, except for elevated creatinine at 4.29 hemoglobin of 10. General and vascular surgery evaluated patient in the emergency room and wanted to be admitted and observed at this time. Advised HIDA scan.  Interim history Patient admitted with abdominal pain.  CT showed evidence of mesenteric ischemia versus infectious colitis.  Abdominal ultrasound showed gallstones.  HIDA scan unremarkable.  General surgery and vascular surgery consulted and appreciated.  S/p laparoscopic cholecystectomy. Assessment & Plan   Intractable abdominal pain/ Acute cholecystitis  -Present on admission and still ongoing -CT abdomen: Mesenteric stranding along several loops of small bowel, appearance nonspecific could reflect inflammation versus ischemia.  Large conglomerate gallstones feeling much of the gallbladder, possibly with surrounding sludge. -Right upper quadrant ultrasound showed 5.6 cm gallstone -HIDA scan unremarkable -Patient was placed on IV antibiotics with ceftriaxone and Flagyl -Vascular surgery consulted and  appreciated for possible mesenteric ischemia however arterial duplex is negative for any arterial insufficiency and liver Doppler suggests a flow within the portal as well as veins making mesenteric venous thrombosis much less likely.  No role for vascular intervention at this time. -General surgery consulted and appreciated, s/p laparoscopic cholecystectomtoday 12/23/2018 -Continue pain control antiemetics as needed -patient with continued pain today- per surgery ?entertitis vs ileus -pending further recommendations from surgery  Chronic kidney disease, stage V -Status post AV fistula placement 2018 -Nephrology consulted and appreciated -Creatinine 4.16 -Continue sodium bicarb tablets  Essential hypertension -Continue Cardizem, Cardura  Hypothyroidism -Continue Synthroid  Normocytic anemia -hemoglobin down to 8.8 -possibly secondary to surgery -Continue to monitor CBC  Physical deconditioning -PT consulted recommended [place] with cane  DVT Prophylaxis Heparin  Code Status: Full  Family Communication: None at bedside  Disposition Plan: Admitted. Dispo home with home health when improved.  Consultants General surgery Vascular surgery  Procedures  Right upper quadrant ultrasound HIDA scan Laparoscopic cholecystectomy  Antibiotics   Anti-infectives (From admission, onward)   Start     Dose/Rate Route Frequency Ordered Stop   12/23/18 0600  ciprofloxacin (CIPRO) IVPB 400 mg  Status:  Discontinued     400 mg 200 mL/hr over 60 Minutes Intravenous On call to O.R. 12/22/18 1930 12/23/18 0952   12/20/18 1630  metroNIDAZOLE (FLAGYL) IVPB 500 mg  Status:  Discontinued     500 mg 100 mL/hr over 60 Minutes Intravenous Every 8 hours 12/20/18 1554 12/20/18 1607   12/20/18 1300  cefTRIAXone (ROCEPHIN) 2 g in sodium chloride 0.9 % 100 mL IVPB     2 g 200 mL/hr over 30 Minutes Intravenous Every 24 hours 12/20/18 1253     12/20/18 1245  metroNIDAZOLE (FLAGYL) IVPB 500 mg  500 mg  100 mL/hr over 60 Minutes Intravenous Every 8 hours 12/20/18 1244        Subjective:   Yvonne Powell seen and examined today.  Pains of abdominal soreness this morning.  Denies current nausea.  Did have some nausea overnight without vomiting.  Denies current chest pain, shortness of breath, diarrhea constipation, dizziness or headache.  Objective:   Vitals:   12/24/18 1022 12/24/18 1714 12/24/18 1952 12/25/18 0346  BP: (!) 194/84 (!) 191/80 (!) 189/74 (!) 180/67  Pulse: 83 84 90 84  Resp: 18 18 18 18   Temp: 98.8 F (37.1 C) 98.4 F (36.9 C) 99.9 F (37.7 C) 98.8 F (37.1 C)  TempSrc: Oral Oral Oral Oral  SpO2: 94% 93% 93% 95%  Weight:    62.2 kg  Height:        Intake/Output Summary (Last 24 hours) at 12/25/2018 0919 Last data filed at 12/25/2018 0600 Gross per 24 hour  Intake 174.91 ml  Output 800 ml  Net -625.09 ml   Filed Weights   12/22/18 2010 12/23/18 2011 12/25/18 0346  Weight: 62.5 kg 62 kg 62.2 kg   Exam  General: Well developed, well nourished, elderly, NAD, appears stated age  69: NCAT, mucous membranes moist.   Neck: Supple  Cardiovascular: S1 S2 auscultated, soft SEM, RRR  Respiratory: Clear to auscultation bilaterally  Abdomen: Soft, nontender, nondistended, + bowel sounds  Extremities: warm dry without cyanosis clubbing or edema  Neuro: AAOx3, nonfocal  Psych: Pleasant, appropriate mood and affect  Data Reviewed: I have personally reviewed following labs and imaging studies  CBC: Recent Labs  Lab 12/20/18 0835 12/22/18 0630 12/23/18 0348 12/24/18 0706 12/25/18 0427  WBC 5.7 10.2 7.5 8.6 7.7  NEUTROABS 3.7  --   --   --   --   HGB 10.0* 9.6* 9.0* 9.1* 8.8*  HCT 30.7* 29.7* 27.2* 27.4* 27.4*  MCV 97.8 99.3 96.8 96.1 99.6  PLT 189 176 172 181 916   Basic Metabolic Panel: Recent Labs  Lab 12/21/18 0539 12/22/18 0630 12/23/18 0348 12/24/18 0706 12/25/18 0427  NA 139 137 137 139 142  K 4.6 4.6 4.4 4.4 4.5  CL 114* 111 110  111 113*  CO2 17* 19* 18* 18* 18*  GLUCOSE 128* 117* 113* 113* 89  BUN 55* 56* 52* 47* 45*  CREATININE 4.06* 4.90* 4.59* 4.38* 4.16*  CALCIUM 8.7* 8.5* 8.5* 8.8* 8.6*  MG 2.2  --   --   --   --   PHOS  --  5.8*  --   --   --    GFR: Estimated Creatinine Clearance: 7.7 mL/min (A) (by C-G formula based on SCr of 4.16 mg/dL (H)). Liver Function Tests: Recent Labs  Lab 12/20/18 0835 12/22/18 0630  AST 17  --   ALT 15  --   ALKPHOS 50  --   BILITOT 0.7  --   PROT 6.2*  --   ALBUMIN 3.7 3.0*   Recent Labs  Lab 12/20/18 0835  LIPASE 35   No results for input(s): AMMONIA in the last 168 hours. Coagulation Profile: No results for input(s): INR, PROTIME in the last 168 hours. Cardiac Enzymes: No results for input(s): CKTOTAL, CKMB, CKMBINDEX, TROPONINI in the last 168 hours. BNP (last 3 results) No results for input(s): PROBNP in the last 8760 hours. HbA1C: No results for input(s): HGBA1C in the last 72 hours. CBG: No results for input(s): GLUCAP in the last 168 hours. Lipid Profile: No results  for input(s): CHOL, HDL, LDLCALC, TRIG, CHOLHDL, LDLDIRECT in the last 72 hours. Thyroid Function Tests: No results for input(s): TSH, T4TOTAL, FREET4, T3FREE, THYROIDAB in the last 72 hours. Anemia Panel: Recent Labs    12/23/18 1335  FERRITIN 987*  TIBC 165*  IRON 39   Urine analysis: No results found for: COLORURINE, APPEARANCEUR, LABSPEC, PHURINE, GLUCOSEU, HGBUR, BILIRUBINUR, KETONESUR, PROTEINUR, UROBILINOGEN, NITRITE, LEUKOCYTESUR Sepsis Labs: @LABRCNTIP (procalcitonin:4,lacticidven:4)  ) Recent Results (from the past 240 hour(s))  SARS Coronavirus 2 [facility] order, Performed in [facility] hospital lab) Nasopharyngeal Nasopharyngeal Swab     Status: None   Collection Time: 12/20/18 11:41 AM   Specimen: Nasopharyngeal Swab  Result Value Ref Range Status   SARS Coronavirus 2 NEGATIVE NEGATIVE Final    Comment: (NOTE) If result is NEGATIVE SARS-CoV-2 target nucleic  acids are NOT DETECTED. The SARS-CoV-2 RNA is generally detectable in upper and lower  respiratory specimens during the acute phase of infection. The lowest  concentration of SARS-CoV-2 viral copies this assay can detect is 250  copies / mL. A negative result does not preclude SARS-CoV-2 infection  and should not be used as the sole basis for treatment or other  patient management decisions.  A negative result may occur with  improper specimen collection / handling, submission of specimen other  than nasopharyngeal swab, presence of viral mutation(s) within the  areas targeted by this assay, and inadequate number of viral copies  (<250 copies / mL). A negative result must be combined with clinical  observations, patient history, and epidemiological information. If result is POSITIVE SARS-CoV-2 target nucleic acids are DETECTED. The SARS-CoV-2 RNA is generally detectable in upper and lower  respiratory specimens dur ing the acute phase of infection.  Positive  results are indicative of active infection with SARS-CoV-2.  Clinical  correlation with patient history and other diagnostic information is  necessary to determine patient infection status.  Positive results do  not rule out bacterial infection or co-infection with other viruses. If result is PRESUMPTIVE POSTIVE SARS-CoV-2 nucleic acids MAY BE PRESENT.   A presumptive positive result was obtained on the submitted specimen  and confirmed on repeat testing.  While 2019 novel coronavirus  (SARS-CoV-2) nucleic acids may be present in the submitted sample  additional confirmatory testing may be necessary for epidemiological  and / or clinical management purposes  to differentiate between  SARS-CoV-2 and other Sarbecovirus currently known to infect humans.  If clinically indicated additional testing with an alternate test  methodology [phone]) is advised. The SARS-CoV-2 RNA is generally  detectable in upper and lower respiratory sp  ecimens during the acute  phase of infection. The expected result is Negative. Fact Sheet for Patients:  StrictlyIdeas.no Fact Sheet for Healthcare Providers: BankingDealers.co.za This test is not yet approved or cleared by the [place] FDA and has been authorized for detection and/or diagnosis of SARS-CoV-2 by FDA under an Emergency Use Authorization (EUA).  This EUA will remain in effect (meaning this test can be used) for the duration of the COVID-19 declaration under Section 564(b)(1) of the Act, 21 U.S.C. section 360bbb-3(b)(1), unless the authorization is terminated or revoked sooner. Performed at [facility], [place] [address] [place], [place] [address]       Radiology Studies: No results found.   Scheduled Meds: . calcitRIOL  0.25 mcg Oral 2 times weekly  . diltiazem  300 mg Oral Daily  . docusate sodium  100 mg Oral BID  . doxazosin  8 mg Oral  Daily  . heparin  5,000 Units Subcutaneous Q8H  . hydrALAZINE  25 mg Oral Q8H  . levothyroxine  88 mcg Oral QAC breakfast   Continuous Infusions: . sodium chloride 250 mL (12/24/18 2024)  . cefTRIAXone (ROCEPHIN)  IV 2 g (12/24/18 1310)  . metronidazole 500 mg (12/25/18 0620)     LOS: 5 days   Time Spent in minutes   30 minutes  Yvonne Powell D.O. on 12/25/2018 at 9:19 AM  Between 7am to 7pm - Please see pager noted on [email or web address]  After 7pm go to [email or web address]  And look for the night coverage person covering for me after hours  [facility] Office  [phone]

## 2018-12-25 NOTE — Progress Notes (Signed)
2 Days Post-Op  Subjective: CC: Abdominal pain.  Patient reports she is doing well today. Yesterday she had abdominal bloating, nausea and about 5 episodes of emesis. She notes later in the afternoon she began having passing more flatus and began having loose, non-watery, non-bloody stools x 3 yesterday. She has also had one this morning. This AM she is no longer nauseated. She has drank some CLD without any emesis. T max 99.9. She denies urinary symptoms, sob, cough. She is not using her IS. She reports she is hungry and wants something different than CLD.   Objective: Vital signs in last 24 hours: Temp:  [98.4 F (36.9 C)-99.9 F (37.7 C)] 98.8 F (37.1 C) (08/22 0346) Pulse Rate:  [83-90] 84 (08/22 0346) Resp:  [18] 18 (08/22 0346) BP: (180-194)/(67-84) 180/67 (08/22 0346) SpO2:  [93 %-95 %] 95 % (08/22 0346) Weight:  [62.2 kg] 62.2 kg (08/22 0346) Last BM Date: 12/24/18  Intake/Output from previous day: 08/21 0701 - 08/22 0700 In: 174.9 [P.O.:120; I.V.:54.9] Out: 800 [Urine:800] Intake/Output this shift: No intake/output data recorded.  PE: Gen: Alert, NAD, pleasant HEENT: EOM's intact, pupils equal and round Pulm:Rate andeffort normal Abd: Soft,distended, slightly hypoactive BS, only tender over incisions/ especially the periumbilical incision, incisions cdi without erythema or drainage Skin: no rashes noted, warm and dry  Lab Results:  Recent Labs    12/24/18 0706 12/25/18 0427  WBC 8.6 7.7  HGB 9.1* 8.8*  HCT 27.4* 27.4*  PLT 181 184   BMET Recent Labs    12/24/18 0706 12/25/18 0427  NA 139 142  K 4.4 4.5  CL 111 113*  CO2 18* 18*  GLUCOSE 113* 89  BUN 47* 45*  CREATININE 4.38* 4.16*  CALCIUM 8.8* 8.6*   PT/INR No results for input(s): LABPROT, INR in the last 72 hours. CMP     Component Value Date/Time   NA 142 12/25/2018 0427   K 4.5 12/25/2018 0427   CL 113 (H) 12/25/2018 0427   CO2 18 (L) 12/25/2018 0427   GLUCOSE 89 12/25/2018  0427   BUN 45 (H) 12/25/2018 0427   CREATININE 4.16 (H) 12/25/2018 0427   CALCIUM 8.6 (L) 12/25/2018 0427   PROT 6.2 (L) 12/20/2018 0835   ALBUMIN 3.0 (L) 12/22/2018 0630   AST 17 12/20/2018 0835   ALT 15 12/20/2018 0835   ALKPHOS 50 12/20/2018 0835   BILITOT 0.7 12/20/2018 0835   GFRNONAA 9 (L) 12/25/2018 0427   GFRAA 10 (L) 12/25/2018 0427   Lipase     Component Value Date/Time   LIPASE 35 12/20/2018 0835       Studies/Results: Dg Abd Portable 1v  Result Date: 12/25/2018 CLINICAL DATA:  Pt complains of superior abdominal pain. She states it feels like "gas pain." Hx laparoscopic cholecystectomy on 12/23/18. EXAM: PORTABLE ABDOMEN - 1 VIEW COMPARISON:  None. FINDINGS: There are dilated loops of small bowel in the LEFT central abdomen. No evidence for free intraperitoneal air. There is a small LEFT pleural effusion. LEFT basilar atelectasis. Note is made of body wall edema. IMPRESSION: Small-bowel obstruction. Recommend further evaluation CT of the abdomen and pelvis with intravenous and oral contrast, if tolerated. Electronically Signed   By: Nolon Nations M.D.   On: 12/25/2018 09:56    Anti-infectives: Anti-infectives (From admission, onward)   Start     Dose/Rate Route Frequency Ordered Stop   12/23/18 0600  ciprofloxacin (CIPRO) IVPB 400 mg  Status:  Discontinued     400 mg  200 mL/hr over 60 Minutes Intravenous On call to O.R. 12/22/18 1930 12/23/18 0952   12/20/18 1630  metroNIDAZOLE (FLAGYL) IVPB 500 mg  Status:  Discontinued     500 mg 100 mL/hr over 60 Minutes Intravenous Every 8 hours 12/20/18 1554 12/20/18 1607   12/20/18 1300  cefTRIAXone (ROCEPHIN) 2 g in sodium chloride 0.9 % 100 mL IVPB     2 g 200 mL/hr over 30 Minutes Intravenous Every 24 hours 12/20/18 1253     12/20/18 1245  metroNIDAZOLE (FLAGYL) IVPB 500 mg     500 mg 100 mL/hr over 60 Minutes Intravenous Every 8 hours 12/20/18 1244         Assessment/Plan Hypertension AKIon CKD -previous right  arm AV fistula creation but has never been on dialysis- creatinine 4.16. nephrology following COVID negative(8/17)  Upper abdominal pain - Cholelithiasis/possible cholecystitis - HIDAnegative - arterial duplex negative for any arterial insufficiency and liver Doppler suggests flow within the portal and splenic veins making mesenteric venous thrombosis much less likely- vascular has signed off -5.6 cm gallbladder stone, otherwise normal GB; CBD 5 mm. See below  Calculus of gallbladder with acute cholecystitis, without mention of obstruction S/p laparoscopic cholecystectomy 8/20 Dr. Brantley Stage - POD #2 - intraoperative: noted to have some enteritis of small bowel - Had some N/V/D yesterday. Abdominal pain improved and tolerating CLD this morning. Xray with ? SBO. T max 99.9. WBC 7.7. Will leave on CLD today and follow WBC and repeat an xray in the am. If patient worsens will perform a CT tomorrow. If develops abdominal bloating, worsening nausea or emesis, would recommend a NGT.   FEN: IVF, CLD ID: [id number] >>Day6 DVT:sq heparin Follow up: TBD   LOS: 5 days    Jillyn Ledger , [facility] 12/25/2018, 10:12 AM Pager: [phone]

## 2018-12-25 NOTE — Plan of Care (Signed)
  Problem: Education: Goal: Knowledge of General Education information will improve Description Including pain rating scale, medication(s)/side effects and non-pharmacologic comfort measures Outcome: Progressing   

## 2018-12-26 ENCOUNTER — Inpatient Hospital Stay (HOSPITAL_COMMUNITY): Payer: Medicare Other

## 2018-12-26 LAB — CBC
HCT: 30.3 % — ABNORMAL LOW (ref 36.0–46.0)
Hemoglobin: 9.7 g/dL — ABNORMAL LOW (ref 12.0–15.0)
MCH: 31.9 pg (ref 26.0–34.0)
MCHC: 32 g/dL (ref 30.0–36.0)
MCV: 99.7 fL (ref 80.0–100.0)
Platelets: 214 10*3/uL (ref 150–400)
RBC: 3.04 MIL/uL — ABNORMAL LOW (ref 3.87–5.11)
RDW: 13.2 % (ref 11.5–15.5)
WBC: 7.4 10*3/uL (ref 4.0–10.5)
nRBC: 0 % (ref 0.0–0.2)

## 2018-12-26 LAB — BASIC METABOLIC PANEL
Anion gap: 9 (ref 5–15)
BUN: 40 mg/dL — ABNORMAL HIGH (ref 8–23)
CO2: 17 mmol/L — ABNORMAL LOW (ref 22–32)
Calcium: 8.8 mg/dL — ABNORMAL LOW (ref 8.9–10.3)
Chloride: 114 mmol/L — ABNORMAL HIGH (ref 98–111)
Creatinine, Ser: 3.74 mg/dL — ABNORMAL HIGH (ref 0.44–1.00)
GFR calc Af Amer: 12 mL/min — ABNORMAL LOW (ref >60)
GFR calc non Af Amer: 10 mL/min — ABNORMAL LOW (ref >60)
Glucose, Bld: 97 mg/dL (ref 70–99)
Potassium: 4.1 mmol/L (ref 3.5–5.1)
Sodium: 140 mmol/L (ref 135–145)

## 2018-12-26 MED ORDER — DARBEPOETIN ALFA 40 MCG/0.4ML IJ SOSY
40.0000 ug | PREFILLED_SYRINGE | Freq: Once | INTRAMUSCULAR | Status: AC
  Administered 2018-12-26: 40 ug via SUBCUTANEOUS
  Filled 2018-12-26: qty 0.4

## 2018-12-26 MED ORDER — SODIUM BICARBONATE-DEXTROSE 150-5 MEQ/L-% IV SOLN
150.0000 meq | INTRAVENOUS | Status: AC
  Administered 2018-12-26: 150 meq via INTRAVENOUS
  Filled 2018-12-26: qty 1000

## 2018-12-26 NOTE — Progress Notes (Signed)
[facility] Progress Note  Name: Yvonne Powell MRN: [id number] DOB: 1929/03/03  Chief Complaint:  abd pain   Subjective:  Note portable film with concern for small bowel obstruction.  States she had a BM this AM and three yesterday and they are letting her have clears for now.  She doesn't have strict ins/outs available for 8/22 but has 5 voids and 150 mL UOP charted.    Review of systems:    Nausea without emesis  abd pain better No shortness of breath  No chest pain   Intake/Output Summary (Last 24 hours) at 12/26/2018 1038 Last data filed at 12/25/2018 2300 Gross per 24 hour  Intake 320 ml  Output 151 ml  Net 169 ml    Vitals:  Vitals:   12/25/18 1952 12/26/18 0314 12/26/18 0452 12/26/18 1002  BP: (!) 158/68  (!) 154/79 (!) 166/69  Pulse: 88  98 83  Resp: 18  18 18   Temp: 98.6 F (37 C)  97.8 F (36.6 C) 97.8 F (36.6 C)  TempSrc: Oral  Oral Oral  SpO2: 96%  97% 98%  Weight:  61.8 kg    Height:         Physical Exam:   General adult female in bed in no acute distress HEENT normocephalic atraumatic extraocular movements intact sclera anicteric Neck supple trachea midline Lungs clear to auscultation bilaterally normal work of breathing at rest  Heart RRR no rub Abdomen soft NT ND Extremities no edema  Psych normal mood and affect Access RUE AVF bruit and thrill   Medications reviewed   Labs:  BMP Latest Ref Rng & Units 12/26/2018 12/25/2018 12/24/2018  Glucose 70 - 99 mg/dL 97 89 113(H)  BUN 8 - 23 mg/dL 40(H) 45(H) 47(H)  Creatinine 0.44 - 1.00 mg/dL 3.74(H) 4.16(H) 4.38(H)  Sodium 135 - 145 mmol/L 140 142 139  Potassium 3.5 - 5.1 mmol/L 4.1 4.5 4.4  Chloride 98 - 111 mmol/L 114(H) 113(H) 111  CO2 22 - 32 mmol/L 17(L) 18(L) 18(L)  Calcium 8.9 - 10.3 mg/dL 8.8(L) 8.6(L) 8.8(L)     Assessment/Plan:   # CKD stage V  - BL Cr reported as 3.8 per a 7/29 nephrology visit  - Stabilized and creatinine improved to her baseline.  Has no acute  indication for dialysis.  Has an AVF RUE with bruit and thrill if this is needed - limited duration IV fluids - bicarb x 10 hours   # Acute cholecystitis   - s/p laparascopic cholecystectomy 8/20 - s/p vascular surgery consult and arterial duplex negative for any arterial insufficiency and mesenteric venous thrombosis felt unlikely.    # Small bowel obstruction - Surgery is following    # Metabolic acidosis  - patient not tolerating oral bicarb tablets      - limited duration IV fluids as above - Can resume prior home regimen on discharge  # HTN  - previously controlled on home regimen.  Added hydralazine and improving control   # anemia normocytic  - no acute indication for PRBC's - Feraheme x 1 on 8/21 - Aranesp x 1  # Secondary hyperpara - on calcitriol per outpatient regimen   Claudia Desanctis, MD 12/26/2018 10:38 AM

## 2018-12-26 NOTE — Progress Notes (Signed)
Progress Note: General Surgery Service   Assessment/Plan: Principal Problem:   Intractable generalized abdominal pain Active Problems:   CKD (chronic kidney disease) stage 5, GFR less than 15 ml/min (HCC)   Cholelithiasis  s/p Procedure(s): LAPAROSCOPIC CHOLECYSTECTOMY 12/23/2018  Calculus of gallbladder with acute cholecystitis, without mention of obstruction S/p laparoscopic cholecystectomy 8/20 Dr. Brantley Stage - POD #3 - intraoperative: noted to have some enteritis of small bowel - continue to have nausea/diarrhea, tolerating some liquids, abdomen distended, normal WBC  Enteritis  -continue current diet and supportive care   LOS: 6 days  Chief Complaint/Subjective: +diarrhea, tolerating liquids, some bloating, some eructations  Objective: Vital signs in last 24 hours: Temp:  [97.8 F (36.6 C)-99 F (37.2 C)] 97.8 F (36.6 C) (08/23 0452) Pulse Rate:  [85-98] 98 (08/23 0452) Resp:  [18-20] 18 (08/23 0452) BP: (142-158)/(54-79) 154/79 (08/23 0452) SpO2:  [95 %-100 %] 97 % (08/23 0452) Weight:  [61.8 kg] 61.8 kg (08/23 0314) Last BM Date: 12/25/18  Intake/Output from previous day: 08/22 0701 - 08/23 0700 In: 780 [P.O.:580; IV Piggyback:200] Out: 151 [Urine:150; Stool:1] Intake/Output this shift: No intake/output data recorded.  Lungs: nonlabored  Cardiovascular: RRR  Abd: soft, ATTP, distended, incisions c/d/i no erythema  Extremities: no edema  Neuro: AOx4  Lab Results: CBC  Recent Labs    12/25/18 0427 12/26/18 0634  WBC 7.7 7.4  HGB 8.8* 9.7*  HCT 27.4* 30.3*  PLT 184 214   BMET Recent Labs    12/25/18 0427 12/26/18 0634  NA 142 140  K 4.5 4.1  CL 113* 114*  CO2 18* 17*  GLUCOSE 89 97  BUN 45* 40*  CREATININE 4.16* 3.74*  CALCIUM 8.6* 8.8*   PT/INR No results for input(s): LABPROT, INR in the last 72 hours. ABG No results for input(s): PHART, HCO3 in the last 72 hours.  Invalid input(s): PCO2, PO2  Studies/Results:   Anti-infectives: Anti-infectives (From admission, onward)   Start     Dose/Rate Route Frequency Ordered Stop   12/23/18 0600  ciprofloxacin (CIPRO) IVPB 400 mg  Status:  Discontinued     400 mg 200 mL/hr over 60 Minutes Intravenous On call to O.R. 12/22/18 1930 12/23/18 0952   12/20/18 1630  metroNIDAZOLE (FLAGYL) IVPB 500 mg  Status:  Discontinued     500 mg 100 mL/hr over 60 Minutes Intravenous Every 8 hours 12/20/18 1554 12/20/18 1607   12/20/18 1300  cefTRIAXone (ROCEPHIN) 2 g in sodium chloride 0.9 % 100 mL IVPB     2 g 200 mL/hr over 30 Minutes Intravenous Every 24 hours 12/20/18 1253     12/20/18 1245  metroNIDAZOLE (FLAGYL) IVPB 500 mg     500 mg 100 mL/hr over 60 Minutes Intravenous Every 8 hours 12/20/18 1244        Medications: Scheduled Meds: . calcitRIOL  0.25 mcg Oral 2 times weekly  . diltiazem  300 mg Oral Daily  . docusate sodium  100 mg Oral BID  . doxazosin  8 mg Oral Daily  . heparin  5,000 Units Subcutaneous Q8H  . hydrALAZINE  25 mg Oral Q8H  . levothyroxine  88 mcg Oral QAC breakfast   Continuous Infusions: . sodium chloride 250 mL (12/24/18 2024)  . cefTRIAXone (ROCEPHIN)  IV 2 g (12/25/18 1414)  . metronidazole 500 mg (12/26/18 0501)   PRN Meds:.sodium chloride, acetaminophen, alum & mag hydroxide-simeth, hydrALAZINE, HYDROmorphone (DILAUDID) injection, hydroxypropyl methylcellulose / hypromellose, methocarbamol, ondansetron **OR** ondansetron (ZOFRAN) IV, oxyCODONE, promethazine  Yvonne Bruce Frances Joynt,  MD [facility]

## 2018-12-26 NOTE — Plan of Care (Signed)
  Problem: Pain Managment: Goal: General experience of comfort will improve Outcome: Progressing   Problem: Safety: Goal: Ability to remain free from injury will improve Outcome: Progressing   Problem: Skin Integrity: Goal: Risk for impaired skin integrity will decrease Outcome: Progressing   

## 2018-12-26 NOTE — Progress Notes (Signed)
Pt. suddenly  complain of being nauseous and started vomiting with greenish colored mucous. Pt. teary -eyed states "its because of that sodium bicarb, no matter what form I take it, this is the effect. I dont want it". This RN  explain the purpose of the sodium Bicarb  IV infusion. Pt. Adamant to stop it. This RN stopped the infusion. MD Mikhail made aware. Will continue to monitor the patient.

## 2018-12-26 NOTE — Progress Notes (Signed)
PROGRESS NOTE    Yvonne Powell  SHF:[id number] DOB: [birth year] DOA: 12/20/2018 PCP: Leeroy Cha, MD   Brief Narrative:  HPI on 12/20/2018 by Dr. Merton Border Yvonne Powell  is a 85 y.o. female, with past medical history significant for hypertension, chronic kidney disease stage V not on hemodialysis yet, history of DVTs and anemia presenting today with 1 day history of generalized abdominal pain that started early in the morning today.  No nausea vomiting or diarrhea, no history of blood in the stools.  No urinary symptoms Patient denies any cough or shortness of breath. Patient has history of advanced renal failure and had an AV fistula placed on the right in 2018. Work-up in the emergency room showed cholelithiasis with no evidence of cholecystitis since CT was done without contrast and some evidence of mesenteric ischemia versus infectious colitis.  Blood work is unremarkable, except for elevated creatinine at 4.29 hemoglobin of 10. General and vascular surgery evaluated patient in the emergency room and wanted to be admitted and observed at this time. Advised HIDA scan.  Interim history Patient admitted with abdominal pain.  CT showed evidence of mesenteric ischemia versus infectious colitis.  Abdominal ultrasound showed gallstones.  HIDA scan unremarkable.  General surgery and vascular surgery consulted and appreciated.  S/p laparoscopic cholecystectomy. Assessment & Plan   Intractable abdominal pain/ Acute cholecystitis  -Present on admission and still ongoing -CT abdomen: Mesenteric stranding along several loops of small bowel, appearance nonspecific could reflect inflammation versus ischemia.  Large conglomerate gallstones feeling much of the gallbladder, possibly with surrounding sludge. -Right upper quadrant ultrasound showed 5.6 cm gallstone -HIDA scan unremarkable -Patient was placed on IV antibiotics with ceftriaxone and Flagyl -Vascular surgery consulted and  appreciated for possible mesenteric ischemia however arterial duplex is negative for any arterial insufficiency and liver Doppler suggests a flow within the portal as well as veins making mesenteric venous thrombosis much less likely.  No role for vascular intervention at this time. -General surgery consulted and appreciated, s/p laparoscopic cholecystectomtoday 12/23/2018 -Continue pain control antiemetics as needed -patient with continued pain today- per surgery ?entertitis vs ileus -X-ray this morning shows increasing gaseous distended loops of small bowel in the central abdomen most compatible with SBO -pending further recommendations from surgery  Chronic kidney disease, stage V -Status post AV fistula placement 2018 -Nephrology consulted and appreciated -Creatinine 3.74 -Continue sodium bicarb tablets  Essential hypertension -Continue Cardizem, Cardura  Hypothyroidism -Continue Synthroid  Normocytic anemia -hemoglobin down to 9.7 -possibly secondary to surgery -Continue to monitor CBC  Physical deconditioning -PT consulted recommended HH with cane  DVT Prophylaxis Heparin  Code Status: Full  Family Communication: None at bedside  Disposition Plan: Admitted. Pending further recommendations from surgery. Dispo home with home health when improved.  Consultants General surgery Vascular surgery  Procedures  Right upper quadrant ultrasound HIDA scan Laparoscopic cholecystectomy  Antibiotics   Anti-infectives (From admission, onward)   Start     Dose/Rate Route Frequency Ordered Stop   12/23/18 0600  ciprofloxacin (CIPRO) IVPB 400 mg  Status:  Discontinued     400 mg 200 mL/hr over 60 Minutes Intravenous On call to O.R. 12/22/18 1930 12/23/18 0952   12/20/18 1630  metroNIDAZOLE (FLAGYL) IVPB 500 mg  Status:  Discontinued     500 mg 100 mL/hr over 60 Minutes Intravenous Every 8 hours 12/20/18 1554 12/20/18 1607   12/20/18 1300  cefTRIAXone (ROCEPHIN) 2 g in sodium  chloride 0.9 % 100 mL IVPB     2  g 200 mL/hr over 30 Minutes Intravenous Every 24 hours 12/20/18 1253     12/20/18 1245  metroNIDAZOLE (FLAGYL) IVPB 500 mg     500 mg 100 mL/hr over 60 Minutes Intravenous Every 8 hours 12/20/18 1244        Subjective:   Yvonne Powell seen and examined today.  Patient having mild pain.  Able to tolerate some liquids.  Was able to have bowel movement yesterday.  Denies current chest pain, shortness of breath, dizziness or headache.    Objective:   Vitals:   12/25/18 1815 12/25/18 1952 12/26/18 0314 12/26/18 0452  BP: (!) 152/54 (!) 158/68  (!) 154/79  Pulse: 88 88  98  Resp: 20 18  18   Temp:  98.6 F (37 C)  97.8 F (36.6 C)  TempSrc:  Oral  Oral  SpO2: 100% 96%  97%  Weight:   61.8 kg   Height:        Intake/Output Summary (Last 24 hours) at 12/26/2018 0956 Last data filed at 12/25/2018 2300 Gross per 24 hour  Intake 560 ml  Output 151 ml  Net 409 ml   Filed Weights   12/23/18 2011 12/25/18 0346 12/26/18 0314  Weight: 62 kg 62.2 kg 61.8 kg   Exam  General: Well developed, well nourished, NAD, appears stated age  85: NCAT, mucous membranes moist.   Neck: Supple  Cardiovascular: S1 S2 auscultated, soft SEM, RRR  Respiratory: Clear to auscultation bilaterally with equal chest rise  Abdomen: Soft, nontender, nondistended, + bowel sounds  Extremities: warm dry without cyanosis clubbing or edema  Neuro: AAOx3, nonfocal  Psych: Pleasant, appropriate mood and affect  Data Reviewed: I have personally reviewed following labs and imaging studies  CBC: Recent Labs  Lab 12/20/18 0835 12/22/18 0630 12/23/18 0348 12/24/18 0706 12/25/18 0427 12/26/18 0634  WBC 5.7 10.2 7.5 8.6 7.7 7.4  NEUTROABS 3.7  --   --   --   --   --   HGB 10.0* 9.6* 9.0* 9.1* 8.8* 9.7*  HCT 30.7* 29.7* 27.2* 27.4* 27.4* 30.3*  MCV 97.8 99.3 96.8 96.1 99.6 99.7  PLT 189 176 172 181 184 622   Basic Metabolic Panel: Recent Labs  Lab 12/21/18 0539  12/22/18 0630 12/23/18 0348 12/24/18 0706 12/25/18 0427 12/26/18 0634  NA 139 137 137 139 142 140  K 4.6 4.6 4.4 4.4 4.5 4.1  CL 114* 111 110 111 113* 114*  CO2 17* 19* 18* 18* 18* 17*  GLUCOSE 128* 117* 113* 113* 89 97  BUN 55* 56* 52* 47* 45* 40*  CREATININE 4.06* 4.90* 4.59* 4.38* 4.16* 3.74*  CALCIUM 8.7* 8.5* 8.5* 8.8* 8.6* 8.8*  MG 2.2  --   --   --   --   --   PHOS  --  5.8*  --   --   --   --    GFR: Estimated Creatinine Clearance: 8.6 mL/min (A) (by C-G formula based on SCr of 3.74 mg/dL (H)). Liver Function Tests: Recent Labs  Lab 12/20/18 0835 12/22/18 0630  AST 17  --   ALT 15  --   ALKPHOS 50  --   BILITOT 0.7  --   PROT 6.2*  --   ALBUMIN 3.7 3.0*   Recent Labs  Lab 12/20/18 0835  LIPASE 35   No results for input(s): AMMONIA in the last 168 hours. Coagulation Profile: No results for input(s): INR, PROTIME in the last 168 hours. Cardiac Enzymes: No results for input(s):  CKTOTAL, CKMB, CKMBINDEX, TROPONINI in the last 168 hours. BNP (last 3 results) No results for input(s): PROBNP in the last 8760 hours. HbA1C: No results for input(s): HGBA1C in the last 72 hours. CBG: No results for input(s): GLUCAP in the last 168 hours. Lipid Profile: No results for input(s): CHOL, HDL, LDLCALC, TRIG, CHOLHDL, LDLDIRECT in the last 72 hours. Thyroid Function Tests: No results for input(s): TSH, T4TOTAL, FREET4, T3FREE, THYROIDAB in the last 72 hours. Anemia Panel: Recent Labs    12/23/18 1335  FERRITIN 987*  TIBC 165*  IRON 39   Urine analysis: No results found for: COLORURINE, APPEARANCEUR, LABSPEC, PHURINE, GLUCOSEU, HGBUR, BILIRUBINUR, KETONESUR, PROTEINUR, UROBILINOGEN, NITRITE, LEUKOCYTESUR Sepsis Labs: @LABRCNTIP (procalcitonin:4,lacticidven:4)  ) Recent Results (from the past 240 hour(s))  SARS Coronavirus 2 [facility] order, Performed in [facility] hospital lab) Nasopharyngeal Nasopharyngeal Swab     Status: None   Collection Time: 12/20/18  11:41 AM   Specimen: Nasopharyngeal Swab  Result Value Ref Range Status   SARS Coronavirus 2 NEGATIVE NEGATIVE Final    Comment: (NOTE) If result is NEGATIVE SARS-CoV-2 target nucleic acids are NOT DETECTED. The SARS-CoV-2 RNA is generally detectable in upper and lower  respiratory specimens during the acute phase of infection. The lowest  concentration of SARS-CoV-2 viral copies this assay can detect is 250  copies / mL. A negative result does not preclude SARS-CoV-2 infection  and should not be used as the sole basis for treatment or other  patient management decisions.  A negative result may occur with  improper specimen collection / handling, submission of specimen other  than nasopharyngeal swab, presence of viral mutation(s) within the  areas targeted by this assay, and inadequate number of viral copies  (<250 copies / mL). A negative result must be combined with clinical  observations, patient history, and epidemiological information. If result is POSITIVE SARS-CoV-2 target nucleic acids are DETECTED. The SARS-CoV-2 RNA is generally detectable in upper and lower  respiratory specimens dur ing the acute phase of infection.  Positive  results are indicative of active infection with SARS-CoV-2.  Clinical  correlation with patient history and other diagnostic information is  necessary to determine patient infection status.  Positive results do  not rule out bacterial infection or co-infection with other viruses. If result is PRESUMPTIVE POSTIVE SARS-CoV-2 nucleic acids MAY BE PRESENT.   A presumptive positive result was obtained on the submitted specimen  and confirmed on repeat testing.  While 2019 novel coronavirus  (SARS-CoV-2) nucleic acids may be present in the submitted sample  additional confirmatory testing may be necessary for epidemiological  and / or clinical management purposes  to differentiate between  SARS-CoV-2 and other Sarbecovirus currently known to infect  humans.  If clinically indicated additional testing with an alternate test  methodology [phone]) is advised. The SARS-CoV-2 RNA is generally  detectable in upper and lower respiratory sp ecimens during the acute  phase of infection. The expected result is Negative. Fact Sheet for Patients:  StrictlyIdeas.no Fact Sheet for Healthcare Providers: BankingDealers.co.za This test is not yet approved or cleared by the [place] FDA and has been authorized for detection and/or diagnosis of SARS-CoV-2 by FDA under an Emergency Use Authorization (EUA).  This EUA will remain in effect (meaning this test can be used) for the duration of the COVID-19 declaration under Section 564(b)(1) of the Act, 21 U.S.C. section 360bbb-3(b)(1), unless the authorization is terminated or revoked sooner. Performed at [facility], [place] [address], [place], [place]  [address]       Radiology Studies: Dg Abd Portable 1v  Result Date: 12/26/2018 CLINICAL DATA:  Emesis. EXAM: PORTABLE ABDOMEN - 1 VIEW COMPARISON:  Abdominal radiograph 12/25/2018 FINDINGS: Interval increase in gaseous distended loops of small bowel within the central abdomen. No distal colonic gas is identified. Heterogeneous opacities lung bases bilaterally. Cholecystectomy clips. Lumbar spine degenerative changes. IMPRESSION: Increasing gaseous distended loops of small bowel in the central abdomen most compatible with small bowel obstruction. Electronically Signed   By: Lovey Newcomer M.D.   On: 12/26/2018 08:47   Dg Abd Portable 1v  Result Date: 12/25/2018 CLINICAL DATA:  Pt complains of superior abdominal pain. She states it feels like "gas pain." Hx laparoscopic cholecystectomy on 12/23/18. EXAM: PORTABLE ABDOMEN - 1 VIEW COMPARISON:  None. FINDINGS: There are dilated loops of small bowel in the LEFT central abdomen. No evidence for free intraperitoneal air. There is a small LEFT pleural effusion.  LEFT basilar atelectasis. Note is made of body wall edema. IMPRESSION: Small-bowel obstruction. Recommend further evaluation CT of the abdomen and pelvis with intravenous and oral contrast, if tolerated. Electronically Signed   By: Nolon Nations M.D.   On: 12/25/2018 09:56     Scheduled Meds: . calcitRIOL  0.25 mcg Oral 2 times weekly  . diltiazem  300 mg Oral Daily  . docusate sodium  100 mg Oral BID  . doxazosin  8 mg Oral Daily  . heparin  5,000 Units Subcutaneous Q8H  . hydrALAZINE  25 mg Oral Q8H  . levothyroxine  88 mcg Oral QAC breakfast   Continuous Infusions: . sodium chloride 250 mL (12/24/18 2024)  . cefTRIAXone (ROCEPHIN)  IV 2 g (12/25/18 1414)  . metronidazole 500 mg (12/26/18 0501)     LOS: 6 days   Time Spent in minutes   30 minutes  Bryan Omura D.O. on 12/26/2018 at 9:56 AM  Between 7am to 7pm - Please see pager noted on [email or web address]  After 7pm go to [email or web address]  And look for the night coverage person covering for me after hours  [facility] Office  [phone]

## 2018-12-26 NOTE — Plan of Care (Signed)
  Problem: Education: Goal: Knowledge of General Education information will improve Description Including pain rating scale, medication(s)/side effects and non-pharmacologic comfort measures Outcome: Progressing   

## 2018-12-27 LAB — BASIC METABOLIC PANEL
Anion gap: 10 (ref 5–15)
BUN: 33 mg/dL — ABNORMAL HIGH (ref 8–23)
CO2: 21 mmol/L — ABNORMAL LOW (ref 22–32)
Calcium: 8.6 mg/dL — ABNORMAL LOW (ref 8.9–10.3)
Chloride: 109 mmol/L (ref 98–111)
Creatinine, Ser: 3.7 mg/dL — ABNORMAL HIGH (ref 0.44–1.00)
GFR calc Af Amer: 12 mL/min — ABNORMAL LOW (ref >60)
GFR calc non Af Amer: 10 mL/min — ABNORMAL LOW (ref >60)
Glucose, Bld: 120 mg/dL — ABNORMAL HIGH (ref 70–99)
Potassium: 3.7 mmol/L (ref 3.5–5.1)
Sodium: 140 mmol/L (ref 135–145)

## 2018-12-27 LAB — CBC
HCT: 27.3 % — ABNORMAL LOW (ref 36.0–46.0)
Hemoglobin: 9.1 g/dL — ABNORMAL LOW (ref 12.0–15.0)
MCH: 32.2 pg (ref 26.0–34.0)
MCHC: 33.3 g/dL (ref 30.0–36.0)
MCV: 96.5 fL (ref 80.0–100.0)
Platelets: 200 10*3/uL (ref 150–400)
RBC: 2.83 MIL/uL — ABNORMAL LOW (ref 3.87–5.11)
RDW: 13 % (ref 11.5–15.5)
WBC: 7.4 10*3/uL (ref 4.0–10.5)
nRBC: 0 % (ref 0.0–0.2)

## 2018-12-27 NOTE — Care Management Important Message (Signed)
Important Message  Patient Details  Name: Yvonne Powell MRN: [id number] Date of Birth: 01/24/1929   Medicare Important Message Given:  Yes     [name] 12/27/2018, 3:36 PM

## 2018-12-27 NOTE — Plan of Care (Signed)
  Problem: Activity: Goal: Risk for activity intolerance will decrease Outcome: Progressing   

## 2018-12-27 NOTE — Progress Notes (Signed)
[facility] ROUNDING NOTE   Subjective:   This is a 85 year old lady with chronic kidney disease stage IV/V.  She has no uremic signs or symptoms appears to be feeling well she has an AV fistula right upper extremity with thrill and bruit.  She had acute cholecystitis status post laparoscopic cholecystectomy 12/23/2018 she also had a vascular surgery consult for an arterial duplex which was negative for arterial insufficiency and mesenteric vein thrombosis was thought to be unlikely.  She is postop small bowel obstruction/ileus and surgery is following.  Blood pressure 146/64 pulse 76 temperature 98.4 O2 sats 96% room air.  Urinalysis 880 cc 12/26/2018 weight 61.8 kg  Sodium 140 potassium 3.7 chloride 109 CO2 21 BUN 33 creatinine 3.7 glucose 120 calcium 8.6 WBC 7.4 hemoglobin 9.1 platelets 200  Calcitrol 0.25 mcg twice weekly, diltiazem 300 mg daily, Cardura 8 mg daily, hydralazine 25 mg every 8 hours levothyroxine 88 mcg daily Flagyl 500 mg every 8 hours, Rocephin 2 g every 24 hours   Objective:  Vital signs in last 24 hours:  Temp:  [97.8 F (36.6 C)-98.4 F (36.9 C)] 98.3 F (36.8 C) (08/24 0441) Pulse Rate:  [50-81] 76 (08/24 0441) Resp:  [16-18] 16 (08/24 0441) BP: (146-156)/(66-74) 146/74 (08/24 0441) SpO2:  [95 %-99 %] 96 % (08/24 0441)  Weight change:  Filed Weights   12/23/18 2011 12/25/18 0346 12/26/18 0314  Weight: 62 kg 62.2 kg 61.8 kg    Intake/Output: I/O last 3 completed shifts: In: 1094.6 [P.O.:540; I.V.:354.6; IV Piggyback:200] Out: 1231 [Urine:1230; Stool:1]   Intake/Output this shift:  No intake/output data recorded.  General adult female in bed in no acute distress HEENT normocephalic atraumatic extraocular movements intact sclera anicteric Neck supple trachea midline Lungs clear to auscultation bilaterally normal work of breathing at rest  Heart RRR no rub Abdomen soft NT ND Extremities no edema  Psych normal mood and affect Access RUE  AVF bruit and thrill    Basic Metabolic Panel: Recent Labs  Lab 12/21/18 0539 12/22/18 0630 12/23/18 0348 12/24/18 0706 12/25/18 0427 12/26/18 0634 12/27/18 0601  NA 139 137 137 139 142 140 140  K 4.6 4.6 4.4 4.4 4.5 4.1 3.7  CL 114* 111 110 111 113* 114* 109  CO2 17* 19* 18* 18* 18* 17* 21*  GLUCOSE 128* 117* 113* 113* 89 97 120*  BUN 55* 56* 52* 47* 45* 40* 33*  CREATININE 4.06* 4.90* 4.59* 4.38* 4.16* 3.74* 3.70*  CALCIUM 8.7* 8.5* 8.5* 8.8* 8.6* 8.8* 8.6*  MG 2.2  --   --   --   --   --   --   PHOS  --  5.8*  --   --   --   --   --     Liver Function Tests: Recent Labs  Lab 12/22/18 0630  ALBUMIN 3.0*   No results for input(s): LIPASE, AMYLASE in the last 168 hours. No results for input(s): AMMONIA in the last 168 hours.  CBC: Recent Labs  Lab 12/23/18 0348 12/24/18 0706 12/25/18 0427 12/26/18 0634 12/27/18 0601  WBC 7.5 8.6 7.7 7.4 7.4  HGB 9.0* 9.1* 8.8* 9.7* 9.1*  HCT 27.2* 27.4* 27.4* 30.3* 27.3*  MCV 96.8 96.1 99.6 99.7 96.5  PLT 172 181 184 214 200    Cardiac Enzymes: No results for input(s): CKTOTAL, CKMB, CKMBINDEX, TROPONINI in the last 168 hours.  BNP: Invalid input(s): POCBNP  CBG: No results for input(s): GLUCAP in the last 168 hours.  Microbiology: Results  for orders placed or performed during the hospital encounter of 12/20/18  SARS Coronavirus 2 [facility] order, Performed in [facility] hospital lab) Nasopharyngeal Nasopharyngeal Swab     Status: None   Collection Time: 12/20/18 11:41 AM   Specimen: Nasopharyngeal Swab  Result Value Ref Range Status   SARS Coronavirus 2 NEGATIVE NEGATIVE Final    Comment: (NOTE) If result is NEGATIVE SARS-CoV-2 target nucleic acids are NOT DETECTED. The SARS-CoV-2 RNA is generally detectable in upper and lower  respiratory specimens during the acute phase of infection. The lowest  concentration of SARS-CoV-2 viral copies this assay can detect is 250  copies / mL. A negative result does not  preclude SARS-CoV-2 infection  and should not be used as the sole basis for treatment or other  patient management decisions.  A negative result may occur with  improper specimen collection / handling, submission of specimen other  than nasopharyngeal swab, presence of viral mutation(s) within the  areas targeted by this assay, and inadequate number of viral copies  (<250 copies / mL). A negative result must be combined with clinical  observations, patient history, and epidemiological information. If result is POSITIVE SARS-CoV-2 target nucleic acids are DETECTED. The SARS-CoV-2 RNA is generally detectable in upper and lower  respiratory specimens dur ing the acute phase of infection.  Positive  results are indicative of active infection with SARS-CoV-2.  Clinical  correlation with patient history and other diagnostic information is  necessary to determine patient infection status.  Positive results do  not rule out bacterial infection or co-infection with other viruses. If result is PRESUMPTIVE POSTIVE SARS-CoV-2 nucleic acids MAY BE PRESENT.   A presumptive positive result was obtained on the submitted specimen  and confirmed on repeat testing.  While 2019 novel coronavirus  (SARS-CoV-2) nucleic acids may be present in the submitted sample  additional confirmatory testing may be necessary for epidemiological  and / or clinical management purposes  to differentiate between  SARS-CoV-2 and other Sarbecovirus currently known to infect humans.  If clinically indicated additional testing with an alternate test  methodology [phone]) is advised. The SARS-CoV-2 RNA is generally  detectable in upper and lower respiratory sp ecimens during the acute  phase of infection. The expected result is Negative. Fact Sheet for Patients:  StrictlyIdeas.no Fact Sheet for Healthcare Providers: BankingDealers.co.za This test is not yet approved or cleared by  the [place] FDA and has been authorized for detection and/or diagnosis of SARS-CoV-2 by FDA under an Emergency Use Authorization (EUA).  This EUA will remain in effect (meaning this test can be used) for the duration of the COVID-19 declaration under Section 564(b)(1) of the Act, 21 U.S.C. section 360bbb-3(b)(1), unless the authorization is terminated or revoked sooner. Performed at [facility], [place] [address], [place], [place] [address]     Coagulation Studies: No results for input(s): LABPROT, INR in the last 72 hours.  Urinalysis: No results for input(s): COLORURINE, LABSPEC, PHURINE, GLUCOSEU, HGBUR, BILIRUBINUR, KETONESUR, PROTEINUR, UROBILINOGEN, NITRITE, LEUKOCYTESUR in the last 72 hours.  Invalid input(s): APPERANCEUR    Imaging: Dg Abd Portable 1v  Result Date: 12/26/2018 CLINICAL DATA:  Emesis. EXAM: PORTABLE ABDOMEN - 1 VIEW COMPARISON:  Abdominal radiograph 12/25/2018 FINDINGS: Interval increase in gaseous distended loops of small bowel within the central abdomen. No distal colonic gas is identified. Heterogeneous opacities lung bases bilaterally. Cholecystectomy clips. Lumbar spine degenerative changes. IMPRESSION: Increasing gaseous distended loops of small bowel in the central abdomen most compatible with small  bowel obstruction. Electronically Signed   By: Lovey Newcomer M.D.   On: 12/26/2018 08:47     Medications:   . sodium chloride 250 mL (12/24/18 2024)  . cefTRIAXone (ROCEPHIN)  IV 2 g (12/26/18 1440)  . metronidazole 500 mg (12/27/18 0600)   . calcitRIOL  0.25 mcg Oral 2 times weekly  . diltiazem  300 mg Oral Daily  . docusate sodium  100 mg Oral BID  . doxazosin  8 mg Oral Daily  . heparin  5,000 Units Subcutaneous Q8H  . hydrALAZINE  25 mg Oral Q8H  . levothyroxine  88 mcg Oral QAC breakfast   sodium chloride, acetaminophen, hydrALAZINE, HYDROmorphone (DILAUDID) injection, hydroxypropyl methylcellulose / hypromellose, methocarbamol,  ondansetron **OR** ondansetron (ZOFRAN) IV, oxyCODONE, promethazine  Assessment/ Plan:   Chronic kidney disease stage IV/V 85 year old lady AV fistula right upper extremity no indication for dialysis she appears to be doing well at this time.  Anemia stable.  Anemia.  She has received Feraheme 12/24/2018 and Aranesp.  Metabolic acidosis appears to be stable at this point  Small bowel obstruction surgery following  Acute cholecystitis status post laparoscopic cholecystectomy continues on IV antibiotics Flagyl and Rocephin  Hypothyroidism on replacement therapy  Hypertension appears adequately controlled   LOS: [place] @TODAY @10 :14 AM

## 2018-12-27 NOTE — Progress Notes (Signed)
PROGRESS NOTE    Yvonne Powell  XQJ:[id number] DOB: Jan 05, 1929 DOA: 12/20/2018 PCP: Leeroy Cha, MD   Brief Narrative:  HPI on 12/20/2018 by Dr. Merton Border Yvonne Powell  is a 85 y.o. female, with past medical history significant for hypertension, chronic kidney disease stage V not on hemodialysis yet, history of DVTs and anemia presenting today with 1 day history of generalized abdominal pain that started early in the morning today.  No nausea vomiting or diarrhea, no history of blood in the stools.  No urinary symptoms Patient denies any cough or shortness of breath. Patient has history of advanced renal failure and had an AV fistula placed on the right in 2018. Work-up in the emergency room showed cholelithiasis with no evidence of cholecystitis since CT was done without contrast and some evidence of mesenteric ischemia versus infectious colitis.  Blood work is unremarkable, except for elevated creatinine at 4.29 hemoglobin of 10. General and vascular surgery evaluated patient in the emergency room and wanted to be admitted and observed at this time. Advised HIDA scan.  Interim history Patient admitted with abdominal pain.  CT showed evidence of mesenteric ischemia versus infectious colitis.  Abdominal ultrasound showed gallstones.  HIDA scan unremarkable.  General surgery and vascular surgery consulted and appreciated.  S/p laparoscopic cholecystectomy. Assessment & Plan   Intractable abdominal pain/ Acute cholecystitis  -Present on admission and still ongoing -CT abdomen: Mesenteric stranding along several loops of small bowel, appearance nonspecific could reflect inflammation versus ischemia.  Large conglomerate gallstones feeling much of the gallbladder, possibly with surrounding sludge. -Right upper quadrant ultrasound showed 5.6 cm gallstone -HIDA scan unremarkable -Patient was placed on IV antibiotics with ceftriaxone and Flagyl -Vascular surgery consulted and  appreciated for possible mesenteric ischemia however arterial duplex is negative for any arterial insufficiency and liver Doppler suggests a flow within the portal as well as veins making mesenteric venous thrombosis much less likely.  No role for vascular intervention at this time. -General surgery consulted and appreciated, s/p laparoscopic cholecystectomtoday 12/23/2018 -Continue pain control antiemetics as needed -patient with continued pain today- per surgery ?entertitis vs ileus -X-ray this morning shows increasing gaseous distended loops of small bowel in the central abdomen most compatible with SBO -Surgery recommending continuing current diet and supportive care  Chronic kidney disease, stage V -Status post AV fistula placement 2018 -Nephrology consulted and appreciated -Creatinine 3.70 -was placed on IV sodium bicarb, however, she had nausea, vomiting yesterday and refused any more  Essential hypertension -Continue Cardizem, Cardura  Hypothyroidism -Continue Synthroid  Normocytic anemia -hemoglobin down to 9.1 -possibly secondary to surgery -Continue to monitor CBC  Physical deconditioning -PT consulted recommended HH with cane  DVT Prophylaxis Heparin  Code Status: Full  Family Communication: None at bedside  Disposition Plan: Admitted. Pending further recommendations from surgery. Dispo home with home health when improved.  Consultants General surgery Vascular surgery  Procedures  Right upper quadrant ultrasound HIDA scan Laparoscopic cholecystectomy  Antibiotics   Anti-infectives (From admission, onward)   Start     Dose/Rate Route Frequency Ordered Stop   12/23/18 0600  ciprofloxacin (CIPRO) IVPB 400 mg  Status:  Discontinued     400 mg 200 mL/hr over 60 Minutes Intravenous On call to O.R. 12/22/18 1930 12/23/18 0952   12/20/18 1630  metroNIDAZOLE (FLAGYL) IVPB 500 mg  Status:  Discontinued     500 mg 100 mL/hr over 60 Minutes Intravenous Every 8 hours  12/20/18 1554 12/20/18 1607   12/20/18 1300  cefTRIAXone (ROCEPHIN)  2 g in sodium chloride 0.9 % 100 mL IVPB     2 g 200 mL/hr over 30 Minutes Intravenous Every 24 hours 12/20/18 1253     12/20/18 1245  metroNIDAZOLE (FLAGYL) IVPB 500 mg     500 mg 100 mL/hr over 60 Minutes Intravenous Every 8 hours 12/20/18 1244        Subjective:   Yvonne Powell seen and examined today.  Patient with continued abdominal pain, bloating, nausea. States she vomiting after sodium bicarb yesterday. Denies current chest pain, shortness of breath, dizziness, headache.   Objective:   Vitals:   12/26/18 1002 12/26/18 1608 12/26/18 2001 12/27/18 0441  BP: (!) 166/69 (!) 156/66 (!) 154/70 (!) 146/74  Pulse: 83 81 (!) 50 76  Resp: 18  18 16   Temp: 97.8 F (36.6 C) 97.8 F (36.6 C) 98.4 F (36.9 C) 98.3 F (36.8 C)  TempSrc: Oral Oral Oral Oral  SpO2: 98% 95% 99% 96%  Weight:      Height:        Intake/Output Summary (Last 24 hours) at 12/27/2018 1023 Last data filed at 12/27/2018 0430 Gross per 24 hour  Intake 854.55 ml  Output 830 ml  Net 24.55 ml   Filed Weights   12/23/18 2011 12/25/18 0346 12/26/18 0314  Weight: 62 kg 62.2 kg 61.8 kg   Exam  General: Well developed, well nourished, NAD, appears stated age  HEENT: NCAT, mucous membranes moist.   Cardiovascular: S1 S2 auscultated, soft SEM, RRR  Respiratory: Clear to auscultation bilaterally with equal chest rise  Abdomen: Soft, mildy TTP, mildly distended, + bowel sounds  Extremities: warm dry without cyanosis clubbing or edema  Neuro: AAOx3, nonfocal  Psych: Appropriate mood and affect, pleasant   Data Reviewed: I have personally reviewed following labs and imaging studies  CBC: Recent Labs  Lab 12/23/18 0348 12/24/18 0706 12/25/18 0427 12/26/18 0634 12/27/18 0601  WBC 7.5 8.6 7.7 7.4 7.4  HGB 9.0* 9.1* 8.8* 9.7* 9.1*  HCT 27.2* 27.4* 27.4* 30.3* 27.3*  MCV 96.8 96.1 99.6 99.7 96.5  PLT 172 181 184 214 341   Basic  Metabolic Panel: Recent Labs  Lab 12/21/18 0539 12/22/18 0630 12/23/18 0348 12/24/18 0706 12/25/18 0427 12/26/18 0634 12/27/18 0601  NA 139 137 137 139 142 140 140  K 4.6 4.6 4.4 4.4 4.5 4.1 3.7  CL 114* 111 110 111 113* 114* 109  CO2 17* 19* 18* 18* 18* 17* 21*  GLUCOSE 128* 117* 113* 113* 89 97 120*  BUN 55* 56* 52* 47* 45* 40* 33*  CREATININE 4.06* 4.90* 4.59* 4.38* 4.16* 3.74* 3.70*  CALCIUM 8.7* 8.5* 8.5* 8.8* 8.6* 8.8* 8.6*  MG 2.2  --   --   --   --   --   --   PHOS  --  5.8*  --   --   --   --   --    GFR: Estimated Creatinine Clearance: 8.7 mL/min (A) (by C-G formula based on SCr of 3.7 mg/dL (H)). Liver Function Tests: Recent Labs  Lab 12/22/18 0630  ALBUMIN 3.0*   No results for input(s): LIPASE, AMYLASE in the last 168 hours. No results for input(s): AMMONIA in the last 168 hours. Coagulation Profile: No results for input(s): INR, PROTIME in the last 168 hours. Cardiac Enzymes: No results for input(s): CKTOTAL, CKMB, CKMBINDEX, TROPONINI in the last 168 hours. BNP (last 3 results) No results for input(s): PROBNP in the last 8760 hours. HbA1C: No results for  input(s): HGBA1C in the last 72 hours. CBG: No results for input(s): GLUCAP in the last 168 hours. Lipid Profile: No results for input(s): CHOL, HDL, LDLCALC, TRIG, CHOLHDL, LDLDIRECT in the last 72 hours. Thyroid Function Tests: No results for input(s): TSH, T4TOTAL, FREET4, T3FREE, THYROIDAB in the last 72 hours. Anemia Panel: No results for input(s): VITAMINB12, FOLATE, FERRITIN, TIBC, IRON, RETICCTPCT in the last 72 hours. Urine analysis: No results found for: COLORURINE, APPEARANCEUR, LABSPEC, PHURINE, GLUCOSEU, HGBUR, BILIRUBINUR, KETONESUR, PROTEINUR, UROBILINOGEN, NITRITE, LEUKOCYTESUR Sepsis Labs: @LABRCNTIP (procalcitonin:4,lacticidven:4)  ) Recent Results (from the past 240 hour(s))  SARS Coronavirus 2 [facility] order, Performed in [facility] hospital lab) Nasopharyngeal Nasopharyngeal  Swab     Status: None   Collection Time: 12/20/18 11:41 AM   Specimen: Nasopharyngeal Swab  Result Value Ref Range Status   SARS Coronavirus 2 NEGATIVE NEGATIVE Final    Comment: (NOTE) If result is NEGATIVE SARS-CoV-2 target nucleic acids are NOT DETECTED. The SARS-CoV-2 RNA is generally detectable in upper and lower  respiratory specimens during the acute phase of infection. The lowest  concentration of SARS-CoV-2 viral copies this assay can detect is 250  copies / mL. A negative result does not preclude SARS-CoV-2 infection  and should not be used as the sole basis for treatment or other  patient management decisions.  A negative result may occur with  improper specimen collection / handling, submission of specimen other  than nasopharyngeal swab, presence of viral mutation(s) within the  areas targeted by this assay, and inadequate number of viral copies  (<250 copies / mL). A negative result must be combined with clinical  observations, patient history, and epidemiological information. If result is POSITIVE SARS-CoV-2 target nucleic acids are DETECTED. The SARS-CoV-2 RNA is generally detectable in upper and lower  respiratory specimens dur ing the acute phase of infection.  Positive  results are indicative of active infection with SARS-CoV-2.  Clinical  correlation with patient history and other diagnostic information is  necessary to determine patient infection status.  Positive results do  not rule out bacterial infection or co-infection with other viruses. If result is PRESUMPTIVE POSTIVE SARS-CoV-2 nucleic acids MAY BE PRESENT.   A presumptive positive result was obtained on the submitted specimen  and confirmed on repeat testing.  While 2019 novel coronavirus  (SARS-CoV-2) nucleic acids may be present in the submitted sample  additional confirmatory testing may be necessary for epidemiological  and / or clinical management purposes  to differentiate between  SARS-CoV-2  and other Sarbecovirus currently known to infect humans.  If clinically indicated additional testing with an alternate test  methodology [id number]) is advised. The SARS-CoV-2 RNA is generally  detectable in upper and lower respiratory sp ecimens during the acute  phase of infection. The expected result is Negative. Fact Sheet for Patients:  StrictlyIdeas.no Fact Sheet for Healthcare Providers: BankingDealers.co.za This test is not yet approved or cleared by the [place] FDA and has been authorized for detection and/or diagnosis of SARS-CoV-2 by FDA under an Emergency Use Authorization (EUA).  This EUA will remain in effect (meaning this test can be used) for the duration of the COVID-19 declaration under Section 564(b)(1) of the Act, 21 U.S.C. section 360bbb-3(b)(1), unless the authorization is terminated or revoked sooner. Performed at [facility], [place] [address], [place], [place] [address]       Radiology Studies: Dg Abd Portable 1v  Result Date: 12/26/2018 CLINICAL DATA:  Emesis. EXAM: PORTABLE ABDOMEN - 1 VIEW COMPARISON:  Abdominal  radiograph 12/25/2018 FINDINGS: Interval increase in gaseous distended loops of small bowel within the central abdomen. No distal colonic gas is identified. Heterogeneous opacities lung bases bilaterally. Cholecystectomy clips. Lumbar spine degenerative changes. IMPRESSION: Increasing gaseous distended loops of small bowel in the central abdomen most compatible with small bowel obstruction. Electronically Signed   By: Lovey Newcomer M.D.   On: 12/26/2018 08:47     Scheduled Meds: . calcitRIOL  0.25 mcg Oral 2 times weekly  . diltiazem  300 mg Oral Daily  . docusate sodium  100 mg Oral BID  . doxazosin  8 mg Oral Daily  . heparin  5,000 Units Subcutaneous Q8H  . hydrALAZINE  25 mg Oral Q8H  . levothyroxine  88 mcg Oral QAC breakfast   Continuous Infusions: . sodium chloride 250 mL (12/24/18  2024)  . cefTRIAXone (ROCEPHIN)  IV 2 g (12/26/18 1440)  . metronidazole 500 mg (12/27/18 0600)     LOS: 7 days   Time Spent in minutes   30 minutes  Avyaan Summer D.O. on 12/27/2018 at 10:23 AM  Between 7am to 7pm - Please see pager noted on [email or web address]  After 7pm go to [email or web address]  And look for the night coverage person covering for me after hours  [facility] Office  [phone]

## 2018-12-27 NOTE — TOC Initial Note (Addendum)
Transition of Care [facility]) - Initial/Assessment Note    Patient Details  Name: Yvonne Powell MRN: [id number] Date of Birth: 04/09/1929  Transition of Care [facility]) CM/SW Contact:    Bartholomew Crews, RN Phone Number: [phone] 12/27/2018, 4:11 PM  Clinical Narrative:                 Spoke with patient and son at bedside. Patient provided permission for CM to speak in front of son. PTA home alone. No HH or DME. Can drive but chooses not to drive. Gets prescriptions filled at local pharmacy, denies problems with managing medications. Discussed recommendations for [facility] PT. Offered choice list, will f/u in AM for choice. Will need HH order for PT with Face to Face. Discussed recommendations for cane, in agreement - requested cane from [employer]. Will need DME order for cane. CM to follow for transition of care needs.   Expected Discharge Plan: [place] Barriers to Discharge: Continued Medical Work up   Patient Goals and CMS Choice Patient states their goals for this hospitalization and ongoing recovery are:: go home CMS Medicare.gov Compare Post Acute Care list provided to:: Patient Choice offered to / list presented to : Patient  Expected Discharge Plan and Services Expected Discharge Plan: [place]   Discharge Planning Services: CM Consult Post Acute Care Choice: Home Health, Durable Medical Equipment Living arrangements for the past 2 months: Single Family Home                 DME Arranged: Kasandra Knudsen DME Agency: [employer] Date DME Agency Contacted: 12/27/18 Time DME Agency Contacted: [phone] Representative spoke with at DME Agency: [place]: PT          Prior Living Arrangements/Services Living arrangements for the past 2 months: [place] Lives with:: Self Patient language and need for interpreter reviewed:: Yes Do you feel safe going back to the place where you live?: Yes      Need for Family Participation in Patient Care:  Yes (Comment) Care giver support system in place?: Yes (comment)   Criminal Activity/Legal Involvement Pertinent to Current Situation/Hospitalization: No - Comment as needed  Activities of Daily Living Home Assistive Devices/Equipment: Eyeglasses, Hearing aid ADL Screening (condition at time of admission) Patient's cognitive ability adequate to safely complete daily activities?: Yes Is the patient deaf or have difficulty hearing?: Yes Does the patient have difficulty seeing, even when wearing glasses/contacts?: No Does the patient have difficulty concentrating, remembering, or making decisions?: No Patient able to express need for assistance with ADLs?: Yes Does the patient have difficulty dressing or bathing?: No Independently performs ADLs?: Yes (appropriate for developmental age) Does the patient have difficulty walking or climbing stairs?: Yes Weakness of Legs: Both Weakness of Arms/Hands: None  Permission Sought/Granted Permission sought to share information with : Family Supports Permission granted to share information with : Yes, Verbal Permission Granted  Share Information with NAME: Aldean Suddeth     Permission granted to share info w Relationship: son  Permission granted to share info w Contact Information: [phone]  Emotional Assessment Appearance:: Appears younger than stated age Attitude/Demeanor/Rapport: Engaged Affect (typically observed): Accepting Orientation: : Oriented to Self, Oriented to Place, Oriented to  Time, Oriented to Situation Alcohol / Substance Use: Not Applicable Psych Involvement: No (comment)  Admission diagnosis:  Calculus of gallbladder without cholecystitis without obstruction [K80.20] Abdominal pain [R10.9] Stage 5 chronic kidney disease not on chronic dialysis [facility]) [N18.5] Patient Active Problem List  Diagnosis Date Noted  . CKD (chronic kidney disease) stage 5, GFR less than 15 ml/min (HCC) 12/21/2018  . Cholelithiasis 12/21/2018   . Intractable generalized abdominal pain 12/20/2018   PCP:  Leeroy Cha, MD Pharmacy:   [id number] - [place], [place] [address] [place] [address] Phone: [phone] Fax: [phone]     Social Determinants of Health (SDOH) Interventions    Readmission Risk Interventions No flowsheet data found.

## 2018-12-27 NOTE — Progress Notes (Signed)
4 Days Post-Op   Subjective/Chief Complaint: Nausea last night with sodium bicarb. Feels better this morning.    Objective: Vital signs in last 24 hours: Temp:  [97.8 F (36.6 C)-98.4 F (36.9 C)] 98.3 F (36.8 C) (08/24 0441) Pulse Rate:  [50-83] 76 (08/24 0441) Resp:  [16-18] 16 (08/24 0441) BP: (146-166)/(66-74) 146/74 (08/24 0441) SpO2:  [95 %-99 %] 96 % (08/24 0441) Last BM Date: 12/26/18  Intake/Output from previous day: 08/23 0701 - 08/24 0700 In: 1094.6 [P.O.:540; I.V.:354.6; IV Piggyback:200] Out: 1080 [Urine:1080] Intake/Output this shift: No intake/output data recorded.  General appearance: alert and cooperative Resp: unlabored GI: soft, mildly distended, nontender. incisions c/d/i no ceelulitis Skin: Skin color, texture, turgor normal. No rashes or lesions Neurologic: Grossly normal  Lab Results:  Recent Labs    12/26/18 0634 12/27/18 0601  WBC 7.4 7.4  HGB 9.7* 9.1*  HCT 30.3* 27.3*  PLT 214 200   BMET Recent Labs    12/26/18 0634 12/27/18 0601  NA 140 140  K 4.1 3.7  CL 114* 109  CO2 17* 21*  GLUCOSE 97 120*  BUN 40* 33*  CREATININE 3.74* 3.70*  CALCIUM 8.8* 8.6*   PT/INR No results for input(s): LABPROT, INR in the last 72 hours. ABG No results for input(s): PHART, HCO3 in the last 72 hours.  Invalid input(s): PCO2, PO2  Studies/Results: Dg Abd Portable 1v  Result Date: 12/26/2018 CLINICAL DATA:  Emesis. EXAM: PORTABLE ABDOMEN - 1 VIEW COMPARISON:  Abdominal radiograph 12/25/2018 FINDINGS: Interval increase in gaseous distended loops of small bowel within the central abdomen. No distal colonic gas is identified. Heterogeneous opacities lung bases bilaterally. Cholecystectomy clips. Lumbar spine degenerative changes. IMPRESSION: Increasing gaseous distended loops of small bowel in the central abdomen most compatible with small bowel obstruction. Electronically Signed   By: Lovey Newcomer M.D.   On: 12/26/2018 08:47     Anti-infectives: Anti-infectives (From admission, onward)   Start     Dose/Rate Route Frequency Ordered Stop   12/23/18 0600  ciprofloxacin (CIPRO) IVPB 400 mg  Status:  Discontinued     400 mg 200 mL/hr over 60 Minutes Intravenous On call to O.R. 12/22/18 1930 12/23/18 0952   12/20/18 1630  metroNIDAZOLE (FLAGYL) IVPB 500 mg  Status:  Discontinued     500 mg 100 mL/hr over 60 Minutes Intravenous Every 8 hours 12/20/18 1554 12/20/18 1607   12/20/18 1300  cefTRIAXone (ROCEPHIN) 2 g in sodium chloride 0.9 % 100 mL IVPB     2 g 200 mL/hr over 30 Minutes Intravenous Every 24 hours 12/20/18 1253     12/20/18 1245  metroNIDAZOLE (FLAGYL) IVPB 500 mg     500 mg 100 mL/hr over 60 Minutes Intravenous Every 8 hours 12/20/18 1244        Assessment/Plan:  Principal Problem:   Intractable generalized abdominal pain Active Problems:   CKD (chronic kidney disease) stage 5, GFR less than 15 ml/min (HCC)   Cholelithiasis  s/p Procedure(s): LAPAROSCOPIC CHOLECYSTECTOMY 12/23/2018  Calculus of gallbladder with acute cholecystitis, without mention of obstruction S/p laparoscopic cholecystectomy 8/20 Dr. Brantley Stage - intraoperative: noted to have some enteritis of small bowel -continue to have nausea/diarrhea, tolerating some liquids, abdomen distended, normal WBC  Enteritis  -continue current diet and supportive care   LOS: 7 days    Clovis Riley 12/27/2018

## 2018-12-27 NOTE — Progress Notes (Signed)
Physical Therapy Treatment Patient Details Name: Yvonne Powell MRN: [id number] DOB: [birth year] Today's Date: 12/27/2018    History of Present Illness 85 yo female with onset of abd pain was admitted and noted ascites and early cirrhosis was noted to have Calculus of gallbladder with acute cholecystitis. Had laparoscopic surgery for cholecystectomy with small bowel enteritis found as well.  PMHx:  anemia, DVT, HTN, renal disorder, graves disease, HD for ESRD, lumbar epidural, hypothyroidism      PT Comments    Pt was up to walk with PT and note that she is demonstrating some difficulty with SPC control.  The cane was substituted for Rollator with a much more safe and controlled presentation.  Follow up acutely with all work on endurance and balance, note that her practice with rollator will be ongoing to ease the transition.  HHPT for follow up mobility as well.   Follow Up Recommendations  Home health PT     Equipment Recommendations  Other (comment)(Rollator walker)    Recommendations for Other Services       Precautions / Restrictions Precautions Precautions: Fall Precaution Comments: recommend Rollator Restrictions Weight Bearing Restrictions: No    Mobility  Bed Mobility Overal bed mobility: Modified Independent                Transfers Overall transfer level: Modified independent Equipment used: 4-wheeled walker Transfers: Sit to/from Stand Sit to Stand: Supervision(for safety)         General transfer comment: pt was able to use Rollator with cues for brakes  Ambulation/Gait Ambulation/Gait assistance: Supervision Gait Distance (Feet): 200 Feet Assistive device: 4-wheeled walker Gait Pattern/deviations: Step-through pattern;Decreased stride length Gait velocity: reduced Gait velocity interpretation: <1.31 ft/sec, indicative of household ambulator General Gait Details: Pt was able to walk with UE support on Rollator and can stop the walker to lock and  sit on it at home   Stairs             Wheelchair Mobility    Modified Rankin (Stroke Patients Only)       Balance   Sitting-balance support: Feet supported Sitting balance-Leahy Scale: Good       Standing balance-Leahy Scale: Fair Standing balance comment: fair static standing                            Cognition Arousal/Alertness: Awake/alert Behavior During Therapy: WFL for tasks assessed/performed Overall Cognitive Status: Within Functional Limits for tasks assessed                                        Exercises      General Comments General comments (skin integrity, edema, etc.): Pt is up to walk with PT using both SPC which she disliked and Rollator which was more secure      Pertinent Vitals/Pain Pain Assessment: Faces Faces Pain Scale: Hurts a little bit Pain Location: abdomen and nausea symptoms Pain Descriptors / Indicators: Operative site guarding Pain Intervention(s): Limited activity within patient's tolerance;Monitored during session;Premedicated before session;Repositioned    Home Living                      Prior Function            PT Goals (current goals can now be found in the care plan section) Acute Rehab PT Goals Patient Stated Goal:  to get home with son Progress towards PT goals: Progressing toward goals    Frequency    Min 3X/week      PT Plan Current plan remains appropriate    Co-evaluation              AM-PAC PT "6 Clicks" Mobility   Outcome Measure  Help needed turning from your back to your side while in a flat bed without using bedrails?: None Help needed moving from lying on your back to sitting on the side of a flat bed without using bedrails?: None Help needed moving to and from a bed to a chair (including a wheelchair)?: A Little Help needed standing up from a chair using your arms (e.g., wheelchair or bedside chair)?: A Little Help needed to walk in hospital  room?: A Little Help needed climbing 3-5 steps with a railing? : A Little 6 Click Score: 20    End of Session Equipment Utilized During Treatment: Gait belt Activity Tolerance: Patient limited by fatigue;Other (comment)(mild nausea) Patient left: in bed;with call bell/phone within reach;with family/visitor present Nurse Communication: Mobility status PT Visit Diagnosis: Unsteadiness on feet (R26.81);Muscle weakness (generalized) (M62.81);Difficulty in walking, not elsewhere classified (R26.2)     Time: 9539-6728 PT Time Calculation (min) (ACUTE ONLY): 16 min  Charges:  $Gait Training: 8-22 mins                 Ramond Dial 12/27/2018, 6:36 PM   Mee Hives, PT MS Acute Rehab Dept. Number: [place] and [place]

## 2018-12-28 DIAGNOSIS — K8001 Calculus of gallbladder with acute cholecystitis with obstruction: Secondary | ICD-10-CM

## 2018-12-28 DIAGNOSIS — E876 Hypokalemia: Secondary | ICD-10-CM

## 2018-12-28 LAB — BASIC METABOLIC PANEL
Anion gap: 7 (ref 5–15)
BUN: 30 mg/dL — ABNORMAL HIGH (ref 8–23)
CO2: 21 mmol/L — ABNORMAL LOW (ref 22–32)
Calcium: 8.3 mg/dL — ABNORMAL LOW (ref 8.9–10.3)
Chloride: 112 mmol/L — ABNORMAL HIGH (ref 98–111)
Creatinine, Ser: 3.68 mg/dL — ABNORMAL HIGH (ref 0.44–1.00)
GFR calc Af Amer: 12 mL/min — ABNORMAL LOW (ref >60)
GFR calc non Af Amer: 10 mL/min — ABNORMAL LOW (ref >60)
Glucose, Bld: 105 mg/dL — ABNORMAL HIGH (ref 70–99)
Potassium: 3.4 mmol/L — ABNORMAL LOW (ref 3.5–5.1)
Sodium: 140 mmol/L (ref 135–145)

## 2018-12-28 LAB — CBC
HCT: 26.3 % — ABNORMAL LOW (ref 36.0–46.0)
Hemoglobin: 8.5 g/dL — ABNORMAL LOW (ref 12.0–15.0)
MCH: 31.7 pg (ref 26.0–34.0)
MCHC: 32.3 g/dL (ref 30.0–36.0)
MCV: 98.1 fL (ref 80.0–100.0)
Platelets: 198 10*3/uL (ref 150–400)
RBC: 2.68 MIL/uL — ABNORMAL LOW (ref 3.87–5.11)
RDW: 13.2 % (ref 11.5–15.5)
WBC: 7 10*3/uL (ref 4.0–10.5)
nRBC: 0 % (ref 0.0–0.2)

## 2018-12-28 MED ORDER — ONDANSETRON HCL 4 MG PO TABS: 4.0000 mg | ORAL_TABLET | Freq: Four times a day (QID) | ORAL | 0 refills | Status: DC | PRN

## 2018-12-28 MED ORDER — BOOST / RESOURCE BREEZE PO LIQD CUSTOM
1.0000 | Freq: Three times a day (TID) | ORAL | Status: DC
  Administered 2018-12-28 (×2): 1 via ORAL

## 2018-12-28 MED ORDER — POTASSIUM CHLORIDE CRYS ER 20 MEQ PO TBCR
40.0000 meq | EXTENDED_RELEASE_TABLET | Freq: Once | ORAL | Status: AC
  Administered 2018-12-28: 40 meq via ORAL
  Filled 2018-12-28: qty 2

## 2018-12-28 NOTE — Plan of Care (Signed)
  Problem: Education: Goal: Knowledge of General Education information will improve Description: Including pain rating scale, medication(s)/side effects and non-pharmacologic comfort measures Outcome: Progressing   Problem: Activity: Goal: Risk for activity intolerance will decrease Outcome: Progressing   

## 2018-12-28 NOTE — Plan of Care (Signed)
  Problem: Activity: Goal: Risk for activity intolerance will decrease Outcome: Progressing   Problem: Coping: Goal: Level of anxiety will decrease Outcome: Progressing   

## 2018-12-28 NOTE — Progress Notes (Signed)
5 Days Post-Op   Subjective/Chief Complaint: Feels much better this morning. No nausea. Has an appetite.    Objective: Vital signs in last 24 hours: Temp:  [97.6 F (36.4 C)-98.4 F (36.9 C)] 97.6 F (36.4 C) (08/25 0357) Pulse Rate:  [60-83] 73 (08/25 0357) Resp:  [16-18] 18 (08/25 0357) BP: (119-144)/(68-83) 119/72 (08/25 0357) SpO2:  [97 %-98 %] 97 % (08/25 0357) Weight:  [62.3 kg] 62.3 kg (08/25 0357) Last BM Date: 12/27/18  Intake/Output from previous day: 08/24 0701 - 08/25 0700 In: 580 [P.O.:580] Out: 325 [Urine:325] Intake/Output this shift: No intake/output data recorded.  General appearance: alert and cooperative Resp: unlabored GI: soft, mildly distended (slight decrease compared to yesterday), nontender. Skin: Skin color, texture, turgor normal. No rashes or lesions Neurologic: Grossly normal  Lab Results:  Recent Labs    12/27/18 0601 12/28/18 0709  WBC 7.4 7.0  HGB 9.1* 8.5*  HCT 27.3* 26.3*  PLT 200 198   BMET Recent Labs    12/27/18 0601 12/28/18 0709  NA 140 140  K 3.7 3.4*  CL 109 112*  CO2 21* 21*  GLUCOSE 120* 105*  BUN 33* 30*  CREATININE 3.70* 3.68*  CALCIUM 8.6* 8.3*   PT/INR No results for input(s): LABPROT, INR in the last 72 hours. ABG No results for input(s): PHART, HCO3 in the last 72 hours.  Invalid input(s): PCO2, PO2  Studies/Results: No results found.  Anti-infectives: Anti-infectives (From admission, onward)   Start     Dose/Rate Route Frequency Ordered Stop   12/23/18 0600  ciprofloxacin (CIPRO) IVPB 400 mg  Status:  Discontinued     400 mg 200 mL/hr over 60 Minutes Intravenous On call to O.R. 12/22/18 1930 12/23/18 0952   12/20/18 1630  metroNIDAZOLE (FLAGYL) IVPB 500 mg  Status:  Discontinued     500 mg 100 mL/hr over 60 Minutes Intravenous Every 8 hours 12/20/18 1554 12/20/18 1607   12/20/18 1300  cefTRIAXone (ROCEPHIN) 2 g in sodium chloride 0.9 % 100 mL IVPB     2 g 200 mL/hr over 30 Minutes  Intravenous Every 24 hours 12/20/18 1253     12/20/18 1245  metroNIDAZOLE (FLAGYL) IVPB 500 mg     500 mg 100 mL/hr over 60 Minutes Intravenous Every 8 hours 12/20/18 1244        Assessment/Plan:  Principal Problem:   Intractable generalized abdominal pain Active Problems:   CKD (chronic kidney disease) stage 5, GFR less than 15 ml/min (HCC)   Cholelithiasis  s/p Procedure(s): LAPAROSCOPIC CHOLECYSTECTOMY 12/23/2018  Calculus of gallbladder with acute cholecystitis, without mention of obstruction S/p laparoscopic cholecystectomy 8/20 Dr. Brantley Stage - intraoperative: noted to have some enteritis of small bowel  Enteritis  -try full liquids this AM. If she can tolerate liquid diet and continues to improve, Ok for discharge from surgery standpoint once cleared by medical team.    LOS: 8 days    Yvonne Powell 12/28/2018

## 2018-12-28 NOTE — TOC Progression Note (Signed)
Transition of Care [facility]) - Progression Note    Patient Details  Name: Yvonne Powell MRN: [id number] Date of Birth: Sep 10, 1928  Transition of Care [facility]) CM/SW Contact  Bartholomew Crews, RN Phone Number: [phone] 12/28/2018, 10:12 AM  Clinical Narrative:    Spoke with patient at bedside. Patient in agreement with rollator, and does not want cane. [employer] notified of change. Discussed [place] agency choice, declines HH at this time. Patient lives with son who is available to assist as needed. Discussed if she changes her mind that PCP may refer to [facility] as well, patient verbalized understanding. No further transition of care needs at this time.    Expected Discharge Plan: [place] Barriers to Discharge: Continued Medical Work up  Expected Discharge Plan and Services Expected Discharge Plan: [place]   Discharge Planning Services: CM Consult Post Acute Care Choice: Home Health, Durable Medical Equipment Living arrangements for the past 2 months: Single Family Home                 DME Arranged: Kasandra Knudsen DME Agency: [employer] Date DME Agency Contacted: 12/27/18 Time DME Agency Contacted: [phone] Representative spoke with at DME Agency: Thedore Mins HH Arranged: PT           Social Determinants of Health ([place]) Interventions    Readmission Risk Interventions No flowsheet data found.

## 2018-12-28 NOTE — Progress Notes (Signed)
[facility] ROUNDING NOTE   Subjective:   This is a 85 year old lady with chronic kidney disease stage IV/V.  She has no uremic signs or symptoms appears to be feeling well she has an AV fistula right upper extremity with thrill and bruit.  She had acute cholecystitis status post laparoscopic cholecystectomy 12/23/2018 she also had a vascular surgery consult for an arterial duplex which was negative for arterial insufficiency and mesenteric vein thrombosis was thought to be unlikely.  She is postop small bowel obstruction/ileus and surgery is following.  Blood pressure 119/72 pulse 73 temperature 97.6 O2 sats 97% room air  Urine output 525 cc 12/27/2018  Sodium 140 potassium 3.4 chloride 112 CO2 21 BUN 30 creatinine 3.68 glucose 105 calcium 8.3.  WBC 7.0 hemoglobin 8.5 platelets 198  Calcitrol 0.25 mcg twice weekly, diltiazem 300 mg daily, Cardura 8 mg daily, hydralazine 25 mg every 8 hours levothyroxine 88 mcg daily Flagyl 500 mg every 8 hours, Rocephin 2 g every 24 hours   Objective:  Vital signs in last 24 hours:  Temp:  [97.6 F (36.4 C)-98.4 F (36.9 C)] 97.6 F (36.4 C) (08/25 0357) Pulse Rate:  [60-83] 73 (08/25 0357) Resp:  [16-18] 18 (08/25 0357) BP: (119-144)/(68-83) 119/72 (08/25 0357) SpO2:  [97 %-98 %] 97 % (08/25 0357) Weight:  [62.3 kg] 62.3 kg (08/25 0357)  Weight change:  Filed Weights   12/25/18 0346 12/26/18 0314 12/28/18 0357  Weight: 62.2 kg 61.8 kg 62.3 kg    Intake/Output: I/O last 3 completed shifts: In: 640 [P.O.:640] Out: 725 [Urine:725]   Intake/Output this shift:  No intake/output data recorded.  General adult female in bed in no acute distress HEENT normocephalic atraumatic extraocular movements intact sclera anicteric Neck supple trachea midline Lungs clear to auscultation bilaterally normal work of breathing at rest  Heart RRR no rub Abdomen soft NT ND Extremities no edema  Psych normal mood and affect Access RUE AVF bruit  and thrill    Basic Metabolic Panel: Recent Labs  Lab 12/22/18 0630  12/24/18 0706 12/25/18 0427 12/26/18 0634 12/27/18 0601 12/28/18 0709  NA 137   < > 139 142 140 140 140  K 4.6   < > 4.4 4.5 4.1 3.7 3.4*  CL 111   < > 111 113* 114* 109 112*  CO2 19*   < > 18* 18* 17* 21* 21*  GLUCOSE 117*   < > 113* 89 97 120* 105*  BUN 56*   < > 47* 45* 40* 33* 30*  CREATININE 4.90*   < > 4.38* 4.16* 3.74* 3.70* 3.68*  CALCIUM 8.5*   < > 8.8* 8.6* 8.8* 8.6* 8.3*  PHOS 5.8*  --   --   --   --   --   --    < > = values in this interval not displayed.    Liver Function Tests: Recent Labs  Lab 12/22/18 0630  ALBUMIN 3.0*   No results for input(s): LIPASE, AMYLASE in the last 168 hours. No results for input(s): AMMONIA in the last 168 hours.  CBC: Recent Labs  Lab 12/24/18 0706 12/25/18 0427 12/26/18 0634 12/27/18 0601 12/28/18 0709  WBC 8.6 7.7 7.4 7.4 7.0  HGB 9.1* 8.8* 9.7* 9.1* 8.5*  HCT 27.4* 27.4* 30.3* 27.3* 26.3*  MCV 96.1 99.6 99.7 96.5 98.1  PLT 181 184 214 200 198    Cardiac Enzymes: No results for input(s): CKTOTAL, CKMB, CKMBINDEX, TROPONINI in the last 168 hours.  BNP: Invalid input(s):  POCBNP  CBG: No results for input(s): GLUCAP in the last 168 hours.  Microbiology: Results for orders placed or performed during the hospital encounter of 12/20/18  SARS Coronavirus 2 [facility] order, Performed in [facility] hospital lab) Nasopharyngeal Nasopharyngeal Swab     Status: None   Collection Time: 12/20/18 11:41 AM   Specimen: Nasopharyngeal Swab  Result Value Ref Range Status   SARS Coronavirus 2 NEGATIVE NEGATIVE Final    Comment: (NOTE) If result is NEGATIVE SARS-CoV-2 target nucleic acids are NOT DETECTED. The SARS-CoV-2 RNA is generally detectable in upper and lower  respiratory specimens during the acute phase of infection. The lowest  concentration of SARS-CoV-2 viral copies this assay can detect is 250  copies / mL. A negative result does not  preclude SARS-CoV-2 infection  and should not be used as the sole basis for treatment or other  patient management decisions.  A negative result may occur with  improper specimen collection / handling, submission of specimen other  than nasopharyngeal swab, presence of viral mutation(s) within the  areas targeted by this assay, and inadequate number of viral copies  (<250 copies / mL). A negative result must be combined with clinical  observations, patient history, and epidemiological information. If result is POSITIVE SARS-CoV-2 target nucleic acids are DETECTED. The SARS-CoV-2 RNA is generally detectable in upper and lower  respiratory specimens dur ing the acute phase of infection.  Positive  results are indicative of active infection with SARS-CoV-2.  Clinical  correlation with patient history and other diagnostic information is  necessary to determine patient infection status.  Positive results do  not rule out bacterial infection or co-infection with other viruses. If result is PRESUMPTIVE POSTIVE SARS-CoV-2 nucleic acids MAY BE PRESENT.   A presumptive positive result was obtained on the submitted specimen  and confirmed on repeat testing.  While 2019 novel coronavirus  (SARS-CoV-2) nucleic acids may be present in the submitted sample  additional confirmatory testing may be necessary for epidemiological  and / or clinical management purposes  to differentiate between  SARS-CoV-2 and other Sarbecovirus currently known to infect humans.  If clinically indicated additional testing with an alternate test  methodology [phone]) is advised. The SARS-CoV-2 RNA is generally  detectable in upper and lower respiratory sp ecimens during the acute  phase of infection. The expected result is Negative. Fact Sheet for Patients:  StrictlyIdeas.no Fact Sheet for Healthcare Providers: BankingDealers.co.za This test is not yet approved or cleared by  the [place] FDA and has been authorized for detection and/or diagnosis of SARS-CoV-2 by FDA under an Emergency Use Authorization (EUA).  This EUA will remain in effect (meaning this test can be used) for the duration of the COVID-19 declaration under Section 564(b)(1) of the Act, 21 U.S.C. section 360bbb-3(b)(1), unless the authorization is terminated or revoked sooner. Performed at [facility], [place] [address] [place], [place] [address]     Coagulation Studies: No results for input(s): LABPROT, INR in the last 72 hours.  Urinalysis: No results for input(s): COLORURINE, LABSPEC, PHURINE, GLUCOSEU, HGBUR, BILIRUBINUR, KETONESUR, PROTEINUR, UROBILINOGEN, NITRITE, LEUKOCYTESUR in the last 72 hours.  Invalid input(s): APPERANCEUR    Imaging: No results found.   Medications:   . sodium chloride 250 mL (12/24/18 2024)  . cefTRIAXone (ROCEPHIN)  IV 2 g (12/27/18 1304)  . metronidazole 500 mg (12/28/18 0505)   . calcitRIOL  0.25 mcg Oral 2 times weekly  . diltiazem  300 mg Oral Daily  . docusate  sodium  100 mg Oral BID  . doxazosin  8 mg Oral Daily  . feeding supplement  1 Container Oral TID BM  . heparin  5,000 Units Subcutaneous Q8H  . hydrALAZINE  25 mg Oral Q8H  . levothyroxine  88 mcg Oral QAC breakfast   sodium chloride, acetaminophen, hydrALAZINE, HYDROmorphone (DILAUDID) injection, hydroxypropyl methylcellulose / hypromellose, methocarbamol, ondansetron **OR** ondansetron (ZOFRAN) IV, oxyCODONE, promethazine  Assessment/ Plan:   Chronic kidney disease stage IV/V 85 year old lady AV fistula right upper extremity no indication for dialysis she appears to be doing well at this time.     Anemia.  She has received Feraheme 12/24/2018 and Aranesp.  She did receive Aranesp on 6/97/9480  Metabolic acidosis appears to be stable at this point  Small bowel obstruction surgery following  Acute cholecystitis status post laparoscopic cholecystectomy continues on IV  antibiotics Flagyl and Rocephin  Hypothyroidism on replacement therapy  Hypertension appears adequately controlled   LOS: [place] @TODAY @8 :50 AM

## 2018-12-28 NOTE — Discharge Summary (Signed)
Physician Discharge Summary  Yvonne Powell VWU:[id number] DOB: 1929-05-01 DOA: 12/20/2018  PCP: Leeroy Cha, MD  Admit date: 12/20/2018 Discharge date: 12/28/2018  Time spent: 45 minutes  Recommendations for Outpatient Follow-up:  Patient will be discharged to home with home health physcial therapy.  Patient will need to follow up with primary care provider within one week of discharge, BMP.  Follow up with general surgery and nephrology. Patient should continue medications as prescribed.  Patient should follow a full liquid diet.   Discharge Diagnoses:  Intractable abdominal pain/ Acute cholecystitis  Chronic kidney disease, stage V Essential hypertension Hypothyroidism Normocytic anemia Physical deconditioning  Discharge Condition: Stable  Diet recommendation: Full liquids  Filed Weights   12/25/18 0346 12/26/18 0314 12/28/18 0357  Weight: 62.2 kg 61.8 kg 62.3 kg    History of present illness:  on 12/20/2018 by Dr. Haze Justin a85 y.o.female,with past medical history significant for hypertension, chronic kidney disease stage V not on hemodialysis yet, history of DVTs and anemia presenting today with 1 day history of generalized abdominal pain that started early in the morning today. No nausea vomiting or diarrhea, no history of blood in the stools. No urinary symptoms Patient denies any cough or shortness of breath. Patient has history of advanced renal failure and had an AV fistula placed on the right in 2018. Work-up in the emergency room showed cholelithiasis with no evidence of cholecystitis since CT was done without contrast and some evidence of mesenteric ischemia versus infectious colitis. Blood work is unremarkable, except for elevated creatinine at 4.29 hemoglobin of 10. General and vascular surgery evaluated patient in the emergency room and wanted to be admitted and observed at this time. Advised HIDA scan.  Hospital Course:    Intractable abdominal pain/ Acute cholecystitis  -Present on admission and still ongoing -CT abdomen: Mesenteric stranding along several loops of small bowel, appearance nonspecific could reflect inflammation versus ischemia.  Large conglomerate gallstones feeling much of the gallbladder, possibly with surrounding sludge. -Right upper quadrant ultrasound showed 5.6 cm gallstone -HIDA scan unremarkable -Patient was placed on IV antibiotics with ceftriaxone and Flagyl -Vascular surgery consulted and appreciated for possible mesenteric ischemia however arterial duplex is negative for any arterial insufficiency and liver Doppler suggests a flow within the portal as well as veins making mesenteric venous thrombosis much less likely.  No role for vascular intervention at this time. -General surgery consulted and appreciated, s/p laparoscopic cholecystectomtoday 12/23/2018 -Continue pain control antiemetics as needed -patient with continued pain today- per surgery ?entertitis vs ileus -Surgery recommending continuing current full liquid diet. Patient tolerated well and will need to follow up with general surgery upon discharge  Chronic kidney disease, stage V -Status post AV fistula placement 2018 -Nephrology consulted and appreciated -Creatinine 3.68 -was placed on IV sodium bicarb, however, she had refused additional infusion due to nausea and vomiting  Essential hypertension -Continue Cardizem, Cardura  Hypothyroidism -Continue Synthroid  Normocytic anemia -hemoglobin down to 8.5 -patient did receive aranesp  -possibly secondary to surgery  Physical deconditioning -PT consulted recommended HH with cane, rollator  Hypokalemia -replaced, repeat BMP in one week  Consultants General surgery Vascular surgery  Procedures  Right upper quadrant ultrasound HIDA scan Laparoscopic cholecystectomy  Discharge Exam: Vitals:   12/28/18 0357 12/28/18 1200  BP: 119/72 136/74  Pulse:  73 74  Resp: 18 18  Temp: 97.6 F (36.4 C) 97.9 F (36.6 C)  SpO2: 97% 97%     General: Well developed, well nourished, NAD, appears stated  age  36: NCAT, mucous membranes moist.  Cardiovascular: S1 S2 auscultated, soft SEM, RRR  Respiratory: Clear to auscultation bilaterally   Abdomen: Soft, nontender, mildly distended, + bowel sounds  Extremities: warm dry without cyanosis clubbing or edema  Neuro: AAOx3, nonfocal  Psych: Appropriate mood and affect, pleasant   Discharge Instructions Discharge Instructions    Discharge instructions   Complete by: As directed    Patient will be discharged to home with home health physcial therapy.  Patient will need to follow up with primary care provider within one week of discharge, BMP.  Follow up with general surgery and nephrology. Patient should continue medications as prescribed.  Patient should follow a full liquid diet.     Allergies as of 12/28/2018      Reactions   Doxycycline Anaphylaxis   Clindamycin/lincomycin Diarrhea   Compazine [prochlorperazine Edisylate] Other (See Comments)   Makes her want to tear off her skin   Iodine    All seafood   Sulfa Antibiotics Hives   Penicillins Rash   Has patient had a PCN reaction causing immediate rash, facial/tongue/throat swelling, SOB or lightheadedness with hypotension:yes Has patient had a PCN reaction causing severe rash involving mucus membranes or skin necrosis: no Has patient had a PCN reaction that required hospitalization }no Has patient had a PCN reaction occurring within the last 10 years: no If all of the above answers are "NO", then may proceed with Cephalosporin      Medication List    STOP taking these medications   diphenhydrAMINE 25 MG tablet Commonly known as: BENADRYL   HYDROcodone-acetaminophen 5-325 MG tablet Commonly known as: NORCO/VICODIN     TAKE these medications   calcitRIOL 0.25 MCG capsule Commonly known as: ROCALTROL Take 0.25 mcg by  mouth 2 (two) times a week. Monday, Friday   diltiazem 300 MG 24 hr capsule Commonly known as: TIAZAC Take 300 mg by mouth daily.   docusate sodium 100 MG capsule Commonly known as: COLACE Take 100 mg by mouth daily as needed for moderate constipation.   doxazosin 8 MG tablet Commonly known as: CARDURA Take 8 mg by mouth daily.   famotidine 20 MG tablet Commonly known as: PEPCID Take 20 mg by mouth once.   hydroxypropyl methylcellulose / hypromellose 2.5 % ophthalmic solution Commonly known as: ISOPTO TEARS / GONIOVISC Place 1 drop into both eyes as needed for dry eyes.   levothyroxine 88 MCG tablet Commonly known as: SYNTHROID Take 88 mcg by mouth daily before breakfast.   NEPHRON FA PO Take 1 tablet by mouth daily.   ondansetron 4 MG tablet Commonly known as: ZOFRAN Take 1 tablet (4 mg total) by mouth every 6 (six) hours as needed for nausea.   OVER THE COUNTER MEDICATION Take 2.5 mLs by mouth daily. Sodium Bicarb powder in 4 0z of H20   oxyCODONE-acetaminophen 5-325 MG tablet Commonly known as: Roxicet Take 1 tablet by mouth every 8 (eight) hours as needed for severe pain.   Vitamin D 50 MCG (2000 UT) tablet Take 2,000 Units by mouth daily.            Durable Medical Equipment  (From admission, onward)         Start     Ordered   12/28/18 0940  For home use only DME 4 wheeled rolling walker with seat  Once    Question:  Patient needs a walker to treat with the following condition  Answer:  Physical deconditioning   12/28/18 0940  12/27/18 1617  For home use only DME Cane  Once     12/27/18 1616         Allergies  Allergen Reactions   Doxycycline Anaphylaxis   Clindamycin/Lincomycin Diarrhea   Compazine [Prochlorperazine Edisylate] Other (See Comments)    Makes her want to tear off her skin   Iodine     All seafood   Sulfa Antibiotics Hives   Penicillins Rash    Has patient had a PCN reaction causing immediate rash,  facial/tongue/throat swelling, SOB or lightheadedness with hypotension: yes Has patient had a PCN reaction causing severe rash involving mucus membranes or skin necrosis: no Has patient had a PCN reaction that required hospitalization no Has patient had a PCN reaction occurring within the last 10 years:no If all of the above answers are "NO", then may proceed with Cephalosporin   Follow-up Information    Surgery, [place]. Go on 01/06/2019.   Specialty: General Surgery Why: Follow up appointment scheduled for 3:30 PM. Please arrive 30 min prior to appointment time. Bring photo ID and insurance information.  Contact information: [address] [place] [address] [phone]            The results of significant diagnostics from this hospitalization (including imaging, microbiology, ancillary and laboratory) are listed below for reference.    Significant Diagnostic Studies: Ct Abdomen Pelvis Wo Contrast  Result Date: 12/20/2018 CLINICAL DATA:  Abdominal pain starting this morning. Abdominal distention. Chronic renal disease. EXAM: CT ABDOMEN AND PELVIS WITHOUT CONTRAST TECHNIQUE: Multidetector CT imaging of the abdomen and pelvis was performed following the standard protocol without IV contrast. COMPARISON:  None. FINDINGS: Lower chest: Patchy ground-glass opacities with some associated interstitial accentuation in both lower lobes, in the lingula, and in the right middle lobe. Descending thoracic aortic atherosclerotic calcification. Hepatobiliary: Mixed density in the gallbladder fundus with calcified elements, nitrogen gas elements, and surrounding density compatible with either large conglomerate gallstones or gallstones with surrounding sludge. Underlying mass of the gallbladder is less likely. I do not see gallbladder wall thickening in the proximal gallbladder. Noncontrast CT appearance of the liver otherwise unremarkable. Pancreas: Unremarkable Spleen: Unremarkable  Adrenals/Urinary Tract: Both adrenal glands appear normal. The noncontrast CT appearance of the right kidney is unremarkable. In the left kidney upper pole, there is a homogeneously dense 3.3 by 2.9 cm partially exophytic lesion with internal density of 61 Hounsfield units, more dense than the adjacent renal parenchyma. Posteriorly in the right kidney lower pole there is a 1.1 cm hypodense lesion on image 28/3, probably a cyst. Centrally along the right kidney lower pole and partially extending into the renal pelvis, a 2.7 by 2.1 cm fluid density lesion is present on image 42/6, probably a parapelvic cyst but not entirely specific. In the right kidney upper pole, adjacent to the homogeneous complex lesion, there is a somewhat more heterogeneous 3.0 by 2.3 cm lesion shown on image 36/6, which appears to probably have a punctate internal calcification. Stomach/Bowel: There is abnormal stranding in loops of small bowel mesentery in the central and right upper pelvis. Normal appendix. No dilated bowel. No pneumatosis. Sigmoid colon diverticulosis is present. No extraluminal gas or abscess. Vascular/Lymphatic: Aortoiliac atherosclerotic vascular disease. No pathologic adenopathy. Reproductive: Unremarkable Other: Small localized amount of ascites along the anterior margin of the inferior right hepatic lobe for example images 32-34 of series 3. Musculoskeletal: Left hip effusion. Questionable left iliopsoas bursitis. Grade 1 degenerative retrolisthesis at T12-L1 and grade 1 degenerative anterolisthesis  at L3-4 and L4-5. Multilevel Schmorl's nodes in the lumbar spine with spondylosis and degenerative disc disease causing mild multilevel impingement. Degenerative arthropathy of both hips, left greater than right. Mild levoconvex lumbar scoliosis. IMPRESSION: 1. The dominant finding most likely correlating with the patient's symptoms is abnormal mesenteric stranding along several loops of small bowel the central and right  upper pelvis. The appearance is nonspecific but could reflect edema associated with inflammation or even ischemia, although there is no appreciable pneumatosis or extraluminal gas at this time, and no dilated bowel. 2. There is a small amount of ascites adjacent to the inferior right hepatic lobe margin. 3. Large conglomerate gallstones filling much of the gallbladder, possibly with surrounding sludge. Strictly speaking, cholecystitis is not readily excluded given the unusual appearance of density around rim of the gallstone. 4. Multiple renal lesions are present. There are 2 complex lesions of the left kidney upper pole, 1 of these is homogeneously dense and probably a complex cyst, but the lesion next to it is somewhat more heterogeneous and could represent a mass/malignancy. I am aware that the patient cannot have IV contrast at this time due to renal insufficiency. Possibilities for further workup might include pelvic ultrasound to assess for vascularity in either lesion, or follow up surveillance CT or MRI. 5. Patchy ground-glass opacities at both lung bases. Chronicity is not established. Possibilities may include hypersensitivity pneumonitis, atypical pneumonia, pulmonary hemorrhage, or chronic pulmonary embolus. 6. Left hip effusion with questionable left iliopsoas bursitis. Left greater than right degenerative hip arthropathy. 7. Mild multilevel impingement in the lumbar spine due to spondylosis and degenerative disc disease. 8.  Aortic Atherosclerosis (ICD10-I70.0). Electronically Signed   By: Van Clines M.D.   On: 12/20/2018 09:59   Dg Abd 1 View  Result Date: 12/22/2018 CLINICAL DATA:  Abdominal distension. EXAM: ABDOMEN - 1 VIEW COMPARISON:  None. FINDINGS: The bowel gas pattern is normal. No radio-opaque calculi or other significant radiographic abnormality are seen. IMPRESSION: No evidence of bowel obstruction or ileus. Electronically Signed   By: Marijo Conception M.D.   On: 12/22/2018  08:37   Nm Hepatobiliary Liver Func  Result Date: 12/21/2018 CLINICAL DATA:  Severe upper abdominal pain and bloating, cholelithiasis by CT EXAM: NUCLEAR MEDICINE HEPATOBILIARY IMAGING TECHNIQUE: Sequential images of the abdomen were obtained out to 60 minutes following intravenous administration of radiopharmaceutical. Patient was unable to perform assessment of gallbladder emptying following fatty meal stimulation due to severe pain and inability to drink ensure. RADIOPHARMACEUTICALS:  5.2 mCi Tc-43m  Choletec IV COMPARISON:  None Correlation: CT abdomen and pelvis 12/20/2018 FINDINGS: Prompt tracer clearance from bloodstream indicating normal hepatocellular function. Prompt excretion of tracer into biliary tree. Small bowel visualized at 28 minutes. Beginning at 36 minutes, tracer is visualized within the gallbladder. No hepatic retention of tracer. IMPRESSION: Normal hepatocellular function. Patent cystic duct and common bile duct. No scintigraphic evidence of acute cholecystitis. Electronically Signed   By: Lavonia Dana M.D.   On: 12/21/2018 14:07   Dg Abd Portable 1v  Result Date: 12/26/2018 CLINICAL DATA:  Emesis. EXAM: PORTABLE ABDOMEN - 1 VIEW COMPARISON:  Abdominal radiograph 12/25/2018 FINDINGS: Interval increase in gaseous distended loops of small bowel within the central abdomen. No distal colonic gas is identified. Heterogeneous opacities lung bases bilaterally. Cholecystectomy clips. Lumbar spine degenerative changes. IMPRESSION: Increasing gaseous distended loops of small bowel in the central abdomen most compatible with small bowel obstruction. Electronically Signed   By: Lovey Newcomer M.D.   On: 12/26/2018  08:47   Dg Abd Portable 1v  Result Date: 12/25/2018 CLINICAL DATA:  Pt complains of superior abdominal pain. She states it feels like "gas pain." Hx laparoscopic cholecystectomy on 12/23/18. EXAM: PORTABLE ABDOMEN - 1 VIEW COMPARISON:  None. FINDINGS: There are dilated loops of small  bowel in the LEFT central abdomen. No evidence for free intraperitoneal air. There is a small LEFT pleural effusion. LEFT basilar atelectasis. Note is made of body wall edema. IMPRESSION: Small-bowel obstruction. Recommend further evaluation CT of the abdomen and pelvis with intravenous and oral contrast, if tolerated. Electronically Signed   By: Nolon Nations M.D.   On: 12/25/2018 09:56   Vas [place] Mesenteric  Result Date: 12/20/2018 ABDOMINAL VISCERAL Indications: acute abdomen pain High Risk Factors: Hypertension. Performing Technologist: June Leap RDMS, RVT  Examination Guidelines: A complete evaluation includes B-mode imaging, spectral Doppler, color Doppler, and power Doppler as needed of all accessible portions of each vessel. Bilateral testing is considered an integral part of a complete examination. Limited examinations for reoccurring indications may be performed as noted.  Duplex Findings: +--------------------+--------+--------+------+--------+  Mesenteric           PSV cm/s EDV cm/s Plaque Comments  +--------------------+--------+--------+------+--------+  Aorta Prox             158       29                     +--------------------+--------+--------+------+--------+  Aorta Mid               98       13                     +--------------------+--------+--------+------+--------+  Celiac Artery Origin   138       21                     +--------------------+--------+--------+------+--------+  SMA Proximal           201       26                     +--------------------+--------+--------+------+--------+  SMA Mid                143       0                      +--------------------+--------+--------+------+--------+  SMA Distal             189       0                      +--------------------+--------+--------+------+--------+  CHA                    138       22                     +--------------------+--------+--------+------+--------+  Splenic                238       38                      +--------------------+--------+--------+------+--------+  IMA                    100       2                      +--------------------+--------+--------+------+--------+  Summary: Mesenteric: Normal Celiac artery , Superior Mesenteric artery and Inferior Mesenteric artery findings.  *See table(s) above for measurements and observations.  Diagnosing physician: Servando Snare MD  Electronically signed by Servando Snare MD on 12/20/2018 at 2:14:40 PM.    Final    US Liver Doppler  Result Date: 12/21/2018 CLINICAL DATA:  85 year old female with possible cirrhosis EXAM: DUPLEX ULTRASOUND OF LIVER TECHNIQUE: Color and duplex Doppler ultrasound was performed to evaluate the hepatic in-flow and out-flow vessels. COMPARISON:  None. FINDINGS: Portal Vein Velocities Main:  37 cm/sec Right:  29 cm/sec Left:  24 cm/sec Hepatic Vein Velocities Right:  42 cm/sec Middle:  41 cm/sec Left:  47 cm/sec Hepatic Artery Velocity:  55 cm/sec Splenic Vein Velocity:  31 cm/sec Varices: Absent Ascites: Small volume ascites of the abdomen. Spleen measures 7 cm x 8.4 cm x 4.8 cm with a volume of 145 cc Mild nodular contour of the liver surface on the high-resolution images. IMPRESSION: Unremarkable directed duplex of the hepatic vasculature. Evidence of early cirrhotic changes with scant ascites. Electronically Signed   By: Corrie Mckusick D.O.   On: 12/21/2018 12:40   US Abdomen Limited Ruq  Result Date: 12/20/2018 CLINICAL DATA:  RIGHT upper quadrant pain, cholelithiasis by CT EXAM: ULTRASOUND ABDOMEN LIMITED RIGHT UPPER QUADRANT COMPARISON:  CT abdomen and pelvis 12/20/2018 FINDINGS: Gallbladder: Large calcified gallstone 5.6 cm diameter with significant shadowing fills the mid and fundal portions of the gallbladder. Lower gallbladder segment normal appearance. No definite gallbladder wall thickening seen, though assessment at the level of the large calculus is limited. No sonographic Murphy sign or pericholecystic fluid. Common bile duct:  Diameter: 5 mm, normal Liver: Normal echogenicity without mass or nodularity. Portal vein is patent on color Doppler imaging with normal direction of blood flow towards the liver. Other: No RIGHT upper quadrant free fluid. IMPRESSION: Large calcified gallstone 5.6 cm diameter filling the mid to fundal portions of the gallbladder with significant shadowing. Remaining gallbladder normal appearance. No biliary dilatation or focal hepatic sonographic abnormalities. Electronically Signed   By: Lavonia Dana M.D.   On: 12/20/2018 11:36    Microbiology: Recent Results (from the past 240 hour(s))  SARS Coronavirus 2 [facility] order, Performed in [facility] hospital lab) Nasopharyngeal Nasopharyngeal Swab     Status: None   Collection Time: 12/20/18 11:41 AM   Specimen: Nasopharyngeal Swab  Result Value Ref Range Status   SARS Coronavirus 2 NEGATIVE NEGATIVE Final    Comment: (NOTE) If result is NEGATIVE SARS-CoV-2 target nucleic acids are NOT DETECTED. The SARS-CoV-2 RNA is generally detectable in upper and lower  respiratory specimens during the acute phase of infection. The lowest  concentration of SARS-CoV-2 viral copies this assay can detect is 250  copies / mL. A negative result does not preclude SARS-CoV-2 infection  and should not be used as the sole basis for treatment or other  patient management decisions.  A negative result may occur with  improper specimen collection / handling, submission of specimen other  than nasopharyngeal swab, presence of viral mutation(s) within the  areas targeted by this assay, and inadequate number of viral copies  (<250 copies / mL). A negative result must be combined with clinical  observations, patient history, and epidemiological information. If result is POSITIVE SARS-CoV-2 target nucleic acids are DETECTED. The SARS-CoV-2 RNA is generally detectable in upper and lower  respiratory specimens dur ing the acute phase of infection.  Positive  results are  indicative of active infection with SARS-CoV-2.  Clinical  correlation with patient history and other diagnostic information is  necessary to determine patient infection status.  Positive results do  not rule out bacterial infection or co-infection with other viruses. If result is PRESUMPTIVE POSTIVE SARS-CoV-2 nucleic acids MAY BE PRESENT.   A presumptive positive result was obtained on the submitted specimen  and confirmed on repeat testing.  While 2019 novel coronavirus  (SARS-CoV-2) nucleic acids may be present in the submitted sample  additional confirmatory testing may be necessary for epidemiological  and / or clinical management purposes  to differentiate between  SARS-CoV-2 and other Sarbecovirus currently known to infect humans.  If clinically indicated additional testing with an alternate test  methodology [phone]) is advised. The SARS-CoV-2 RNA is generally  detectable in upper and lower respiratory sp ecimens during the acute  phase of infection. The expected result is Negative. Fact Sheet for Patients:  StrictlyIdeas.no Fact Sheet for Healthcare Providers: BankingDealers.co.za This test is not yet approved or cleared by the [place] FDA and has been authorized for detection and/or diagnosis of SARS-CoV-2 by FDA under an Emergency Use Authorization (EUA).  This EUA will remain in effect (meaning this test can be used) for the duration of the COVID-19 declaration under Section 564(b)(1) of the Act, 21 U.S.C. section 360bbb-3(b)(1), unless the authorization is terminated or revoked sooner. Performed at [facility], [place] [address], [place], [place] [address]      Labs: Basic Metabolic Panel: Recent Labs  Lab 12/22/18 0630  12/24/18 0706 12/25/18 0427 12/26/18 0634 12/27/18 0601 12/28/18 0709  NA 137   < > 139 142 140 140 140  K 4.6   < > 4.4 4.5 4.1 3.7 3.4*  CL 111   < > 111 113* 114* 109 112*  CO2 19*    < > 18* 18* 17* 21* 21*  GLUCOSE 117*   < > 113* 89 97 120* 105*  BUN 56*   < > 47* 45* 40* 33* 30*  CREATININE 4.90*   < > 4.38* 4.16* 3.74* 3.70* 3.68*  CALCIUM 8.5*   < > 8.8* 8.6* 8.8* 8.6* 8.3*  PHOS 5.8*  --   --   --   --   --   --    < > = values in this interval not displayed.   Liver Function Tests: Recent Labs  Lab 12/22/18 0630  ALBUMIN 3.0*   No results for input(s): LIPASE, AMYLASE in the last 168 hours. No results for input(s): AMMONIA in the last 168 hours. CBC: Recent Labs  Lab 12/24/18 0706 12/25/18 0427 12/26/18 0634 12/27/18 0601 12/28/18 0709  WBC 8.6 7.7 7.4 7.4 7.0  HGB 9.1* 8.8* 9.7* 9.1* 8.5*  HCT 27.4* 27.4* 30.3* 27.3* 26.3*  MCV 96.1 99.6 99.7 96.5 98.1  PLT 181 184 214 200 198   Cardiac Enzymes: No results for input(s): CKTOTAL, CKMB, CKMBINDEX, TROPONINI in the last 168 hours. BNP: BNP (last 3 results) No results for input(s): BNP in the last 8760 hours.  ProBNP (last 3 results) No results for input(s): PROBNP in the last 8760 hours.  CBG: No results for input(s): GLUCAP in the last 168 hours.     Signed:  Cristal Ford  [facility] 12/28/2018, 3:25 PM

## 2018-12-28 NOTE — Progress Notes (Signed)
Yvonne Powell to be discharged home per MD order. Discussed prescriptions and follow up appointments with the patient. Prescriptions given to patient; medication list explained in detail. Patient verbalized understanding.  Skin clean, dry and intact without evidence of skin break down, no evidence of skin tears noted. IV catheter discontinued intact. Site without signs and symptoms of complications. Dressing and pressure applied. Pt denies pain at the site currently. No complaints noted.  Patient free of lines, drains, and wounds.   An After Visit Summary (AVS) was printed and given to the patient. Patient escorted via wheelchair, and discharged home via private auto.  Baldo Ash, RN

## 2018-12-28 NOTE — Discharge Instructions (Signed)
[facility] LAPAROSCOPIC SURGERY: POST OP INSTRUCTIONS Always review your discharge instruction sheet given to you by the facility where your surgery was performed. IF YOU HAVE DISABILITY OR FAMILY LEAVE FORMS, YOU MUST BRING THEM TO THE OFFICE FOR PROCESSING.   DO NOT GIVE THEM TO YOUR DOCTOR.  PAIN CONTROL  1. First take acetaminophen (Tylenol) AND/or ibuprofen (Advil) to control your pain after surgery.  Follow directions on package.  Taking acetaminophen (Tylenol) and/or ibuprofen (Advil) regularly after surgery will help to control your pain and lower the amount of prescription pain medication you may need.  You should not take more than 3,000 mg (3 grams) of acetaminophen (Tylenol) in 24 hours.  You should not take ibuprofen (Advil), aleve, motrin, naprosyn or other NSAIDS if you have a history of stomach ulcers or chronic kidney disease.  2. A prescription for pain medication may be given to you upon discharge.  Take your pain medication as prescribed, if you still have uncontrolled pain after taking acetaminophen (Tylenol) or ibuprofen (Advil). 3. Use ice packs to help control pain. 4. If you need a refill on your pain medication, please contact your pharmacy.  They will contact our office to request authorization. Prescriptions will not be filled after 5pm or on week-ends.  HOME MEDICATIONS 5. Take your usually prescribed medications unless otherwise directed.  DIET 6. You should follow a light diet the first few days after arrival home.  Be sure to include lots of fluids daily. Avoid fatty, fried foods.   CONSTIPATION 7. It is common to experience some constipation after surgery and if you are taking pain medication.  Increasing fluid intake and taking a stool softener (such as Colace) will usually help or prevent this problem from occurring.  A mild laxative (Milk of Magnesia or Miralax) should be taken according to package instructions if there are no bowel  movements after 48 hours.  WOUND/INCISION CARE 8. Most patients will experience some swelling and bruising in the area of the incisions.  Ice packs will help.  Swelling and bruising can take several days to resolve.  9. Unless discharge instructions indicate otherwise, follow guidelines below  a. STERI-STRIPS - you may remove your outer bandages 48 hours after surgery, and you may shower at that time.  You have steri-strips (small skin tapes) in place directly over the incision.  These strips should be left on the skin for 7-10 days.   b. DERMABOND/SKIN GLUE - you may shower in 24 hours.  The glue will flake off over the next 2-3 weeks. 10. Any sutures or staples will be removed at the office during your follow-up visit.  ACTIVITIES 11. You may resume regular (light) daily activities beginning the next day--such as daily self-care, walking, climbing stairs--gradually increasing activities as tolerated.  You may have sexual intercourse when it is comfortable.  Refrain from any heavy lifting or straining until approved by your doctor. a. You may drive when you are no longer taking prescription pain medication, you can comfortably wear a seatbelt, and you can safely maneuver your car and apply brakes.  FOLLOW-UP 12. You should see your doctor in the office for a follow-up appointment approximately 2-3 weeks after your surgery.  You should have been given your post-op/follow-up appointment when your surgery was scheduled.  If you did not receive a post-op/follow-up appointment, make sure that you call for this appointment within a day or two after you arrive home to insure a convenient appointment time.     WHEN TO CALL YOUR DOCTOR: 1. Fever over 101.0 2. Inability to urinate 3. Continued bleeding from incision. 4. Increased pain, redness, or drainage from the incision. 5. Increasing abdominal pain  The clinic staff is available to answer your questions during regular business hours.  Please don't  hesitate to call and ask to speak to one of the nurses for clinical concerns.  If you have a medical emergency, go to the nearest emergency room or call 911.  A surgeon from [facility] is always on call at the hospital. [address], [address], [place], [place]  [address] ? [address] [address], [place], [place]   [address] [phone] ? [phone] ? FAX [phone] Web site: [email or web address]  .........   Managing Your Pain After Surgery Without Opioids    Thank you for participating in our program to help patients manage their pain after surgery without opioids. This is part of our effort to provide you with the best care possible, without exposing you or your family to the risk that opioids pose.  What pain can I expect after surgery? You can expect to have some pain after surgery. This is normal. The pain is typically worse the day after surgery, and quickly begins to get better. Many studies have found that many patients are able to manage their pain after surgery with Over-the-Counter (OTC) medications such as Tylenol and Motrin. If you have a condition that does not allow you to take Tylenol or Motrin, notify your surgical team.  How will I manage my pain? The best strategy for controlling your pain after surgery is around the clock pain control with Tylenol (acetaminophen) and Motrin (ibuprofen or Advil). Alternating these medications with each other allows you to maximize your pain control. In addition to Tylenol and Motrin, you can use heating pads or ice packs on your incisions to help reduce your pain.  How will I alternate your regular strength over-the-counter pain medication? You will take a dose of pain medication every three hours. ; Start by taking 650 mg of Tylenol (2 pills of 325 mg) ; 3 hours later take 600 mg of Motrin (3 pills of 200 mg) ; 3 hours after taking the Motrin take 650 mg of Tylenol ; 3 hours after that take 600 mg of  Motrin.   - 1 -  See example - if your first dose of Tylenol is at 12:00 PM   12:00 PM Tylenol 650 mg (2 pills of 325 mg)  3:00 PM Motrin 600 mg (3 pills of 200 mg)  6:00 PM Tylenol 650 mg (2 pills of 325 mg)  9:00 PM Motrin 600 mg (3 pills of 200 mg)  Continue alternating every 3 hours   We recommend that you follow this schedule around-the-clock for at least 3 days after surgery, or until you feel that it is no longer needed. Use the table on the last page of this handout to keep track of the medications you are taking. Important: Do not take more than 3000mg of Tylenol or 3200mg of Motrin in a 24-hour period. Do not take ibuprofen/Motrin if you have a history of bleeding stomach ulcers, severe kidney disease, &/or actively taking a blood thinner  What if I still have pain? If you have pain that is not controlled with the over-the-counter pain medications (Tylenol and Motrin or Advil) you might have what we call "breakthrough" pain. You will receive a prescription for a small amount of an opioid pain medication such as   Oxycodone, Tramadol, or Tylenol with Codeine. Use these opioid pills in the first 24 hours after surgery if you have breakthrough pain. Do not take more than 1 pill every 4-6 hours.  If you still have uncontrolled pain after using all opioid pills, don't hesitate to call our staff using the number provided. We will help make sure you are managing your pain in the best way possible, and if necessary, we can provide a prescription for additional pain medication.   Day 1    Time  Name of Medication Number of pills taken  Amount of Acetaminophen  Pain Level   Comments  AM PM       AM PM       AM PM       AM PM       AM PM       AM PM       AM PM       AM PM       Total Daily amount of Acetaminophen Do not take more than  3,000 mg per day      Day 2    Time  Name of Medication Number of pills taken  Amount of Acetaminophen  Pain Level   Comments  AM  PM       AM PM       AM PM       AM PM       AM PM       AM PM       AM PM       AM PM       Total Daily amount of Acetaminophen Do not take more than  3,000 mg per day      Day 3    Time  Name of Medication Number of pills taken  Amount of Acetaminophen  Pain Level   Comments  AM PM       AM PM       AM PM       AM PM          AM PM       AM PM       AM PM       AM PM       Total Daily amount of Acetaminophen Do not take more than  3,000 mg per day      Day 4    Time  Name of Medication Number of pills taken  Amount of Acetaminophen  Pain Level   Comments  AM PM       AM PM       AM PM       AM PM       AM PM       AM PM       AM PM       AM PM       Total Daily amount of Acetaminophen Do not take more than  3,000 mg per day      Day 5    Time  Name of Medication Number of pills taken  Amount of Acetaminophen  Pain Level   Comments  AM PM       AM PM       AM PM       AM PM       AM PM       AM PM       AM PM         AM PM       Total Daily amount of Acetaminophen Do not take more than  3,000 mg per day       Day 6    Time  Name of Medication Number of pills taken  Amount of Acetaminophen  Pain Level  Comments  AM PM       AM PM       AM PM       AM PM       AM PM       AM PM       AM PM       AM PM       Total Daily amount of Acetaminophen Do not take more than  3,000 mg per day      Day 7    Time  Name of Medication Number of pills taken  Amount of Acetaminophen  Pain Level   Comments  AM PM       AM PM       AM PM       AM PM       AM PM       AM PM       AM PM       AM PM       Total Daily amount of Acetaminophen Do not take more than  3,000 mg per day        For additional information about how and where to safely dispose of unused opioid medications - https://www.morepowerfulnc.org  Disclaimer: This document contains information and/or instructional materials adapted from [facility]  for the typical patient with your condition. It does not replace medical advice from your health care provider because your experience may differ from that of the typical patient. Talk to your health care provider if you have any questions about this document, your condition or your treatment plan. Adapted from [facility]  

## 2018-12-28 NOTE — Plan of Care (Signed)
  Problem: Education: Goal: Knowledge of General Education information will improve Description: Including pain rating scale, medication(s)/side effects and non-pharmacologic comfort measures Outcome: Adequate for Discharge   Problem: Health Behavior/Discharge Planning: Goal: Ability to manage health-related needs will improve Outcome: Adequate for Discharge   Problem: Clinical Measurements: Goal: Ability to maintain clinical measurements within normal limits will improve Outcome: Adequate for Discharge Goal: Will remain free from infection Outcome: Adequate for Discharge Goal: Diagnostic test results will improve Outcome: Adequate for Discharge Goal: Respiratory complications will improve Outcome: Adequate for Discharge Goal: Cardiovascular complication will be avoided Outcome: Adequate for Discharge   Problem: Activity: Goal: Risk for activity intolerance will decrease 12/28/2018 1544 by Baldo Ash, RN Outcome: Adequate for Discharge 12/28/2018 [phone] by Baldo Ash, RN Outcome: Progressing   Problem: Nutrition: Goal: Adequate nutrition will be maintained Outcome: Adequate for Discharge   Problem: Coping: Goal: Level of anxiety will decrease 12/28/2018 1544 by Baldo Ash, RN Outcome: Adequate for Discharge 12/28/2018 0851 by Baldo Ash, RN Outcome: Progressing   Problem: Elimination: Goal: Will not experience complications related to bowel motility Outcome: Adequate for Discharge Goal: Will not experience complications related to urinary retention Outcome: Adequate for Discharge   Problem: Pain Managment: Goal: General experience of comfort will improve Outcome: Adequate for Discharge   Problem: Safety: Goal: Ability to remain free from injury will improve Outcome: Adequate for Discharge   Problem: Acute Rehab PT Goals(only PT should resolve) Goal: Pt Will Transfer Bed To Chair/Chair To Bed Outcome: Adequate for Discharge Goal: Pt Will  Perform Standing Balance Or Pre-Gait Outcome: Adequate for Discharge Goal: Pt Will Ambulate Outcome: Adequate for Discharge Goal: Pt Will Go Up/Down Stairs Outcome: Adequate for Discharge   Problem: Skin Integrity: Goal: Risk for impaired skin integrity will decrease Outcome: Adequate for Discharge

## 2018-12-31 DIAGNOSIS — E876 Hypokalemia: Secondary | ICD-10-CM | POA: Diagnosis not present

## 2018-12-31 DIAGNOSIS — D638 Anemia in other chronic diseases classified elsewhere: Secondary | ICD-10-CM | POA: Diagnosis not present

## 2018-12-31 DIAGNOSIS — N185 Chronic kidney disease, stage 5: Secondary | ICD-10-CM | POA: Diagnosis not present

## 2018-12-31 DIAGNOSIS — I120 Hypertensive chronic kidney disease with stage 5 chronic kidney disease or end stage renal disease: Secondary | ICD-10-CM | POA: Diagnosis not present

## 2018-12-31 DIAGNOSIS — R5381 Other malaise: Secondary | ICD-10-CM | POA: Diagnosis not present

## 2018-12-31 DIAGNOSIS — K810 Acute cholecystitis: Secondary | ICD-10-CM | POA: Diagnosis not present

## 2018-12-31 DIAGNOSIS — E039 Hypothyroidism, unspecified: Secondary | ICD-10-CM | POA: Diagnosis not present

## 2019-01-12 DIAGNOSIS — N189 Chronic kidney disease, unspecified: Secondary | ICD-10-CM | POA: Diagnosis not present

## 2019-01-12 DIAGNOSIS — N2581 Secondary hyperparathyroidism of renal origin: Secondary | ICD-10-CM | POA: Diagnosis not present

## 2019-01-12 DIAGNOSIS — N185 Chronic kidney disease, stage 5: Secondary | ICD-10-CM | POA: Diagnosis not present

## 2019-01-12 DIAGNOSIS — I120 Hypertensive chronic kidney disease with stage 5 chronic kidney disease or end stage renal disease: Secondary | ICD-10-CM | POA: Diagnosis not present

## 2019-01-12 DIAGNOSIS — D631 Anemia in chronic kidney disease: Secondary | ICD-10-CM | POA: Diagnosis not present

## 2019-01-12 DIAGNOSIS — Z8719 Personal history of other diseases of the digestive system: Secondary | ICD-10-CM | POA: Diagnosis not present

## 2019-01-17 DIAGNOSIS — N185 Chronic kidney disease, stage 5: Secondary | ICD-10-CM | POA: Diagnosis not present

## 2019-01-17 DIAGNOSIS — R112 Nausea with vomiting, unspecified: Secondary | ICD-10-CM | POA: Diagnosis not present

## 2019-01-17 DIAGNOSIS — D638 Anemia in other chronic diseases classified elsewhere: Secondary | ICD-10-CM | POA: Diagnosis not present

## 2019-01-17 DIAGNOSIS — K810 Acute cholecystitis: Secondary | ICD-10-CM | POA: Diagnosis not present

## 2019-01-17 DIAGNOSIS — R5381 Other malaise: Secondary | ICD-10-CM | POA: Diagnosis not present

## 2019-01-17 DIAGNOSIS — R197 Diarrhea, unspecified: Secondary | ICD-10-CM | POA: Diagnosis not present

## 2019-01-17 DIAGNOSIS — N281 Cyst of kidney, acquired: Secondary | ICD-10-CM | POA: Diagnosis not present

## 2019-01-24 ENCOUNTER — Ambulatory Visit (HOSPITAL_COMMUNITY)
Admission: RE | Admit: 2019-01-24 | Discharge: 2019-01-24 | Disposition: A | Discharge status: Home / Self Care | Payer: Medicare Other | Source: Ambulatory Visit | Attending: Nephrology | Admitting: Nephrology

## 2019-01-24 ENCOUNTER — Other Ambulatory Visit: Payer: Self-pay

## 2019-01-24 VITALS — BP 144/65 | HR 80 | Temp 97.3°F | Resp 20

## 2019-01-24 DIAGNOSIS — N185 Chronic kidney disease, stage 5: Secondary | ICD-10-CM | POA: Diagnosis not present

## 2019-01-24 MED ORDER — EPOETIN ALFA-EPBX 10000 UNIT/ML IJ SOLN
20000.0000 [IU] | INTRAMUSCULAR | Status: DC
  Filled 2019-01-24: qty 2

## 2019-01-24 NOTE — Discharge Instructions (Signed)

## 2019-01-24 NOTE — Progress Notes (Signed)
Left message with Amber at [facility] that patient refused her retacrit injection.  Patient was very emotional, nervous, tearful.  She was afraid of the possible side effects and that she was "still recovering from [place] surgery".  Patient was asked to call the office .

## 2019-01-27 LAB — POCT HEMOGLOBIN-HEMACUE: Hemoglobin: 10.4 g/dL — ABNORMAL LOW (ref 12.0–15.0)

## 2019-02-04 DIAGNOSIS — N189 Chronic kidney disease, unspecified: Secondary | ICD-10-CM | POA: Diagnosis not present

## 2019-02-04 DIAGNOSIS — N185 Chronic kidney disease, stage 5: Secondary | ICD-10-CM | POA: Diagnosis not present

## 2019-02-04 DIAGNOSIS — I120 Hypertensive chronic kidney disease with stage 5 chronic kidney disease or end stage renal disease: Secondary | ICD-10-CM | POA: Diagnosis not present

## 2019-02-04 DIAGNOSIS — D631 Anemia in chronic kidney disease: Secondary | ICD-10-CM | POA: Diagnosis not present

## 2019-02-04 DIAGNOSIS — N2581 Secondary hyperparathyroidism of renal origin: Secondary | ICD-10-CM | POA: Diagnosis not present

## 2019-02-07 ENCOUNTER — Encounter (HOSPITAL_COMMUNITY): Payer: Medicare Other

## 2019-02-17 DIAGNOSIS — Z86718 Personal history of other venous thrombosis and embolism: Secondary | ICD-10-CM | POA: Diagnosis not present

## 2019-02-17 DIAGNOSIS — Z48815 Encounter for surgical aftercare following surgery on the digestive system: Secondary | ICD-10-CM | POA: Diagnosis not present

## 2019-02-17 DIAGNOSIS — I129 Hypertensive chronic kidney disease with stage 1 through stage 4 chronic kidney disease, or unspecified chronic kidney disease: Secondary | ICD-10-CM | POA: Diagnosis not present

## 2019-02-17 DIAGNOSIS — N184 Chronic kidney disease, stage 4 (severe): Secondary | ICD-10-CM | POA: Diagnosis not present

## 2019-02-17 DIAGNOSIS — E039 Hypothyroidism, unspecified: Secondary | ICD-10-CM | POA: Diagnosis not present

## 2019-02-17 DIAGNOSIS — D631 Anemia in chronic kidney disease: Secondary | ICD-10-CM | POA: Diagnosis not present

## 2019-02-21 DIAGNOSIS — Z86718 Personal history of other venous thrombosis and embolism: Secondary | ICD-10-CM | POA: Diagnosis not present

## 2019-02-21 DIAGNOSIS — E039 Hypothyroidism, unspecified: Secondary | ICD-10-CM | POA: Diagnosis not present

## 2019-02-21 DIAGNOSIS — Z48815 Encounter for surgical aftercare following surgery on the digestive system: Secondary | ICD-10-CM | POA: Diagnosis not present

## 2019-02-21 DIAGNOSIS — I129 Hypertensive chronic kidney disease with stage 1 through stage 4 chronic kidney disease, or unspecified chronic kidney disease: Secondary | ICD-10-CM | POA: Diagnosis not present

## 2019-02-21 DIAGNOSIS — D631 Anemia in chronic kidney disease: Secondary | ICD-10-CM | POA: Diagnosis not present

## 2019-02-21 DIAGNOSIS — N184 Chronic kidney disease, stage 4 (severe): Secondary | ICD-10-CM | POA: Diagnosis not present

## 2019-02-25 DIAGNOSIS — N184 Chronic kidney disease, stage 4 (severe): Secondary | ICD-10-CM | POA: Diagnosis not present

## 2019-02-25 DIAGNOSIS — I129 Hypertensive chronic kidney disease with stage 1 through stage 4 chronic kidney disease, or unspecified chronic kidney disease: Secondary | ICD-10-CM | POA: Diagnosis not present

## 2019-02-25 DIAGNOSIS — D631 Anemia in chronic kidney disease: Secondary | ICD-10-CM | POA: Diagnosis not present

## 2019-02-25 DIAGNOSIS — Z86718 Personal history of other venous thrombosis and embolism: Secondary | ICD-10-CM | POA: Diagnosis not present

## 2019-02-25 DIAGNOSIS — E039 Hypothyroidism, unspecified: Secondary | ICD-10-CM | POA: Diagnosis not present

## 2019-02-25 DIAGNOSIS — Z48815 Encounter for surgical aftercare following surgery on the digestive system: Secondary | ICD-10-CM | POA: Diagnosis not present

## 2019-03-01 DIAGNOSIS — Z48815 Encounter for surgical aftercare following surgery on the digestive system: Secondary | ICD-10-CM | POA: Diagnosis not present

## 2019-03-01 DIAGNOSIS — Z86718 Personal history of other venous thrombosis and embolism: Secondary | ICD-10-CM | POA: Diagnosis not present

## 2019-03-01 DIAGNOSIS — E039 Hypothyroidism, unspecified: Secondary | ICD-10-CM | POA: Diagnosis not present

## 2019-03-01 DIAGNOSIS — D631 Anemia in chronic kidney disease: Secondary | ICD-10-CM | POA: Diagnosis not present

## 2019-03-01 DIAGNOSIS — N184 Chronic kidney disease, stage 4 (severe): Secondary | ICD-10-CM | POA: Diagnosis not present

## 2019-03-01 DIAGNOSIS — I129 Hypertensive chronic kidney disease with stage 1 through stage 4 chronic kidney disease, or unspecified chronic kidney disease: Secondary | ICD-10-CM | POA: Diagnosis not present

## 2019-03-04 DIAGNOSIS — E039 Hypothyroidism, unspecified: Secondary | ICD-10-CM | POA: Diagnosis not present

## 2019-03-04 DIAGNOSIS — Z48815 Encounter for surgical aftercare following surgery on the digestive system: Secondary | ICD-10-CM | POA: Diagnosis not present

## 2019-03-04 DIAGNOSIS — Z86718 Personal history of other venous thrombosis and embolism: Secondary | ICD-10-CM | POA: Diagnosis not present

## 2019-03-04 DIAGNOSIS — N184 Chronic kidney disease, stage 4 (severe): Secondary | ICD-10-CM | POA: Diagnosis not present

## 2019-03-04 DIAGNOSIS — I129 Hypertensive chronic kidney disease with stage 1 through stage 4 chronic kidney disease, or unspecified chronic kidney disease: Secondary | ICD-10-CM | POA: Diagnosis not present

## 2019-03-04 DIAGNOSIS — D631 Anemia in chronic kidney disease: Secondary | ICD-10-CM | POA: Diagnosis not present

## 2019-03-08 DIAGNOSIS — E039 Hypothyroidism, unspecified: Secondary | ICD-10-CM | POA: Diagnosis not present

## 2019-03-08 DIAGNOSIS — Z86718 Personal history of other venous thrombosis and embolism: Secondary | ICD-10-CM | POA: Diagnosis not present

## 2019-03-08 DIAGNOSIS — Z48815 Encounter for surgical aftercare following surgery on the digestive system: Secondary | ICD-10-CM | POA: Diagnosis not present

## 2019-03-08 DIAGNOSIS — I129 Hypertensive chronic kidney disease with stage 1 through stage 4 chronic kidney disease, or unspecified chronic kidney disease: Secondary | ICD-10-CM | POA: Diagnosis not present

## 2019-03-08 DIAGNOSIS — N184 Chronic kidney disease, stage 4 (severe): Secondary | ICD-10-CM | POA: Diagnosis not present

## 2019-03-08 DIAGNOSIS — D631 Anemia in chronic kidney disease: Secondary | ICD-10-CM | POA: Diagnosis not present

## 2019-03-19 DIAGNOSIS — D631 Anemia in chronic kidney disease: Secondary | ICD-10-CM | POA: Diagnosis not present

## 2019-03-19 DIAGNOSIS — Z48815 Encounter for surgical aftercare following surgery on the digestive system: Secondary | ICD-10-CM | POA: Diagnosis not present

## 2019-03-19 DIAGNOSIS — I129 Hypertensive chronic kidney disease with stage 1 through stage 4 chronic kidney disease, or unspecified chronic kidney disease: Secondary | ICD-10-CM | POA: Diagnosis not present

## 2019-03-19 DIAGNOSIS — E039 Hypothyroidism, unspecified: Secondary | ICD-10-CM | POA: Diagnosis not present

## 2019-03-19 DIAGNOSIS — N184 Chronic kidney disease, stage 4 (severe): Secondary | ICD-10-CM | POA: Diagnosis not present

## 2019-03-19 DIAGNOSIS — Z86718 Personal history of other venous thrombosis and embolism: Secondary | ICD-10-CM | POA: Diagnosis not present

## 2019-03-21 DIAGNOSIS — I129 Hypertensive chronic kidney disease with stage 1 through stage 4 chronic kidney disease, or unspecified chronic kidney disease: Secondary | ICD-10-CM | POA: Diagnosis not present

## 2019-03-21 DIAGNOSIS — N184 Chronic kidney disease, stage 4 (severe): Secondary | ICD-10-CM | POA: Diagnosis not present

## 2019-03-21 DIAGNOSIS — E039 Hypothyroidism, unspecified: Secondary | ICD-10-CM | POA: Diagnosis not present

## 2019-03-21 DIAGNOSIS — Z48815 Encounter for surgical aftercare following surgery on the digestive system: Secondary | ICD-10-CM | POA: Diagnosis not present

## 2019-03-21 DIAGNOSIS — Z86718 Personal history of other venous thrombosis and embolism: Secondary | ICD-10-CM | POA: Diagnosis not present

## 2019-03-21 DIAGNOSIS — D631 Anemia in chronic kidney disease: Secondary | ICD-10-CM | POA: Diagnosis not present

## 2019-04-05 DIAGNOSIS — N184 Chronic kidney disease, stage 4 (severe): Secondary | ICD-10-CM | POA: Diagnosis not present

## 2019-04-05 DIAGNOSIS — Z86718 Personal history of other venous thrombosis and embolism: Secondary | ICD-10-CM | POA: Diagnosis not present

## 2019-04-05 DIAGNOSIS — Z48815 Encounter for surgical aftercare following surgery on the digestive system: Secondary | ICD-10-CM | POA: Diagnosis not present

## 2019-04-05 DIAGNOSIS — D631 Anemia in chronic kidney disease: Secondary | ICD-10-CM | POA: Diagnosis not present

## 2019-04-05 DIAGNOSIS — I129 Hypertensive chronic kidney disease with stage 1 through stage 4 chronic kidney disease, or unspecified chronic kidney disease: Secondary | ICD-10-CM | POA: Diagnosis not present

## 2019-04-05 DIAGNOSIS — E039 Hypothyroidism, unspecified: Secondary | ICD-10-CM | POA: Diagnosis not present

## 2019-04-13 DIAGNOSIS — N281 Cyst of kidney, acquired: Secondary | ICD-10-CM | POA: Diagnosis not present

## 2019-04-13 DIAGNOSIS — N185 Chronic kidney disease, stage 5: Secondary | ICD-10-CM | POA: Diagnosis not present

## 2019-04-13 DIAGNOSIS — N2581 Secondary hyperparathyroidism of renal origin: Secondary | ICD-10-CM | POA: Diagnosis not present

## 2019-04-13 DIAGNOSIS — Z8719 Personal history of other diseases of the digestive system: Secondary | ICD-10-CM | POA: Diagnosis not present

## 2019-04-13 DIAGNOSIS — N189 Chronic kidney disease, unspecified: Secondary | ICD-10-CM | POA: Diagnosis not present

## 2019-04-13 DIAGNOSIS — I120 Hypertensive chronic kidney disease with stage 5 chronic kidney disease or end stage renal disease: Secondary | ICD-10-CM | POA: Diagnosis not present

## 2019-04-13 DIAGNOSIS — I770 Arteriovenous fistula, acquired: Secondary | ICD-10-CM | POA: Diagnosis not present

## 2019-04-13 DIAGNOSIS — D631 Anemia in chronic kidney disease: Secondary | ICD-10-CM | POA: Diagnosis not present

## 2019-06-16 DIAGNOSIS — I120 Hypertensive chronic kidney disease with stage 5 chronic kidney disease or end stage renal disease: Secondary | ICD-10-CM | POA: Diagnosis not present

## 2019-06-16 DIAGNOSIS — N189 Chronic kidney disease, unspecified: Secondary | ICD-10-CM | POA: Diagnosis not present

## 2019-06-16 DIAGNOSIS — D631 Anemia in chronic kidney disease: Secondary | ICD-10-CM | POA: Diagnosis not present

## 2019-06-16 DIAGNOSIS — N281 Cyst of kidney, acquired: Secondary | ICD-10-CM | POA: Diagnosis not present

## 2019-06-16 DIAGNOSIS — N2581 Secondary hyperparathyroidism of renal origin: Secondary | ICD-10-CM | POA: Diagnosis not present

## 2019-06-16 DIAGNOSIS — N185 Chronic kidney disease, stage 5: Secondary | ICD-10-CM | POA: Diagnosis not present

## 2019-06-28 ENCOUNTER — Encounter (HOSPITAL_COMMUNITY): Payer: Medicare Other

## 2019-07-04 NOTE — Discharge Instructions (Signed)

## 2019-07-05 ENCOUNTER — Ambulatory Visit (HOSPITAL_COMMUNITY)
Admission: RE | Admit: 2019-07-05 | Discharge: 2019-07-05 | Disposition: A | Discharge status: Home / Self Care | Payer: Medicare Other | Source: Ambulatory Visit | Attending: Nephrology | Admitting: Nephrology

## 2019-07-05 ENCOUNTER — Other Ambulatory Visit: Payer: Self-pay

## 2019-07-05 VITALS — BP 171/68 | HR 84 | Temp 97.5°F | Resp 20

## 2019-07-05 DIAGNOSIS — N185 Chronic kidney disease, stage 5: Secondary | ICD-10-CM | POA: Diagnosis not present

## 2019-07-05 LAB — POCT HEMOGLOBIN-HEMACUE: Hemoglobin: 7.5 g/dL — ABNORMAL LOW (ref 12.0–15.0)

## 2019-07-05 MED ORDER — EPOETIN ALFA-EPBX 10000 UNIT/ML IJ SOLN: 20000.0000 [IU] | INTRAMUSCULAR | Status: DC

## 2019-07-05 MED ORDER — EPOETIN ALFA-EPBX 10000 UNIT/ML IJ SOLN
INTRAMUSCULAR | Status: AC
  Administered 2019-07-05: 20000 [IU]
  Filled 2019-07-05: qty 2

## 2019-07-05 NOTE — Progress Notes (Signed)
Pt here for first retacrit injection.  HGB 7.5 via hemocue.  Pt slightly short of breath since las week but no chest pain or signs of bleeding.  Dr Hollie Salk notified.  Instructed to go ahead and give shot and to change frequency to every 2 weeks

## 2019-07-10 ENCOUNTER — Ambulatory Visit: Payer: Medicare Other

## 2019-07-16 ENCOUNTER — Ambulatory Visit: Payer: Medicare Other | Attending: Internal Medicine

## 2019-07-16 DIAGNOSIS — Z23 Encounter for immunization: Secondary | ICD-10-CM

## 2019-07-16 NOTE — Progress Notes (Signed)
   Covid-19 Vaccination Clinic  Name:  Temitope Flammer    MRN: [id number] DOB: 12-15-1928  07/16/2019  Ms. Oliveria was observed post Covid-19 immunization for 30 minutes based on pre-vaccination screening without incident. She was provided with Vaccine Information Sheet and instruction to access the V-Safe system.   Ms. Musa was instructed to call 911 with any severe reactions post vaccine: Marland Kitchen Difficulty breathing  . Swelling of face and throat  . A fast heartbeat  . A bad rash all over body  . Dizziness and weakness   Immunizations Administered    Name Date Dose VIS Date Route   Pfizer COVID-19 Vaccine 07/16/2019  2:55 PM 0.3 mL 04/15/2019 Intramuscular   Manufacturer: [place]   Lot: [id number]   [place]: [address]-2

## 2019-07-19 ENCOUNTER — Encounter (HOSPITAL_COMMUNITY)
Admission: RE | Admit: 2019-07-19 | Discharge: 2019-07-19 | Disposition: A | Discharge status: Home / Self Care | Payer: Medicare Other | Source: Ambulatory Visit | Attending: Nephrology | Admitting: Nephrology

## 2019-07-19 ENCOUNTER — Other Ambulatory Visit: Payer: Self-pay

## 2019-07-19 VITALS — BP 166/59 | HR 79 | Temp 97.3°F | Resp 20

## 2019-07-19 DIAGNOSIS — N185 Chronic kidney disease, stage 5: Secondary | ICD-10-CM | POA: Diagnosis not present

## 2019-07-19 LAB — POCT HEMOGLOBIN-HEMACUE: Hemoglobin: 8.2 g/dL — ABNORMAL LOW (ref 12.0–15.0)

## 2019-07-19 MED ORDER — EPOETIN ALFA-EPBX 10000 UNIT/ML IJ SOLN
INTRAMUSCULAR | Status: AC
  Administered 2019-07-19: 20000 [IU] via SUBCUTANEOUS
  Filled 2019-07-19: qty 2

## 2019-07-19 MED ORDER — EPOETIN ALFA-EPBX 10000 UNIT/ML IJ SOLN: 20000.0000 [IU] | INTRAMUSCULAR | Status: DC

## 2019-08-02 ENCOUNTER — Other Ambulatory Visit: Payer: Self-pay

## 2019-08-02 ENCOUNTER — Encounter (HOSPITAL_COMMUNITY)
Admission: RE | Admit: 2019-08-02 | Discharge: 2019-08-02 | Disposition: A | Discharge status: Home / Self Care | Payer: Medicare Other | Source: Ambulatory Visit | Attending: Nephrology | Admitting: Nephrology

## 2019-08-02 ENCOUNTER — Encounter (HOSPITAL_COMMUNITY): Payer: Medicare Other

## 2019-08-02 VITALS — BP 150/62 | HR 73 | Temp 95.5°F | Resp 20

## 2019-08-02 DIAGNOSIS — N185 Chronic kidney disease, stage 5: Secondary | ICD-10-CM

## 2019-08-02 LAB — POCT HEMOGLOBIN-HEMACUE: Hemoglobin: 9.9 g/dL — ABNORMAL LOW (ref 12.0–15.0)

## 2019-08-02 MED ORDER — EPOETIN ALFA-EPBX 10000 UNIT/ML IJ SOLN
INTRAMUSCULAR | Status: AC
  Administered 2019-08-02: 20000 [IU]
  Filled 2019-08-02: qty 2

## 2019-08-02 MED ORDER — EPOETIN ALFA-EPBX 10000 UNIT/ML IJ SOLN: 20000.0000 [IU] | INTRAMUSCULAR | Status: DC

## 2019-08-09 ENCOUNTER — Ambulatory Visit: Payer: Medicare Other | Attending: Internal Medicine

## 2019-08-09 DIAGNOSIS — Z23 Encounter for immunization: Secondary | ICD-10-CM

## 2019-08-09 NOTE — Progress Notes (Signed)
   Covid-19 Vaccination Clinic  Name:  Yvonne Powell    MRN: [id number] DOB: 1928-06-03  08/09/2019  [name] was observed post Covid-19 immunization for 30 minutes based on pre-vaccination screening without incident. She was provided with Vaccine Information Sheet and instruction to access the V-Safe system.   [name] was instructed to call 911 with any severe reactions post vaccine: Marland Kitchen Difficulty breathing  . Swelling of face and throat  . A fast heartbeat  . A bad rash all over body  . Dizziness and weakness   Immunizations Administered    Name Date Dose VIS Date Route   Pfizer COVID-19 Vaccine 08/09/2019  3:21 PM 0.3 mL 04/15/2019 Intramuscular   Manufacturer: [place]   Lot: [id number]   [place]: [id number]

## 2019-08-16 ENCOUNTER — Encounter (HOSPITAL_COMMUNITY)
Admission: RE | Admit: 2019-08-16 | Discharge: 2019-08-16 | Disposition: A | Discharge status: Home / Self Care | Payer: Medicare Other | Source: Ambulatory Visit | Attending: Nephrology | Admitting: Nephrology

## 2019-08-16 ENCOUNTER — Other Ambulatory Visit: Payer: Self-pay

## 2019-08-16 VITALS — BP 155/66 | HR 72 | Temp 97.2°F | Resp 20

## 2019-08-16 DIAGNOSIS — N185 Chronic kidney disease, stage 5: Secondary | ICD-10-CM | POA: Insufficient documentation

## 2019-08-16 LAB — IRON AND TIBC
Iron: 46 ug/dL (ref 28–170)
Saturation Ratios: 21 % (ref 10.4–31.8)
TIBC: 224 ug/dL — ABNORMAL LOW (ref 250–450)
UIBC: 178 ug/dL

## 2019-08-16 LAB — FERRITIN: Ferritin: 345 ng/mL — ABNORMAL HIGH (ref 11–307)

## 2019-08-16 LAB — POCT HEMOGLOBIN-HEMACUE: Hemoglobin: 9.5 g/dL — ABNORMAL LOW (ref 12.0–15.0)

## 2019-08-16 MED ORDER — EPOETIN ALFA-EPBX 10000 UNIT/ML IJ SOLN
INTRAMUSCULAR | Status: AC
  Administered 2019-08-16: 13:00:00 20000 [IU] via SUBCUTANEOUS
  Filled 2019-08-16: qty 2

## 2019-08-16 MED ORDER — EPOETIN ALFA-EPBX 10000 UNIT/ML IJ SOLN: 20000.0000 [IU] | INTRAMUSCULAR | Status: DC

## 2019-08-24 DIAGNOSIS — N2581 Secondary hyperparathyroidism of renal origin: Secondary | ICD-10-CM | POA: Diagnosis not present

## 2019-08-24 DIAGNOSIS — N185 Chronic kidney disease, stage 5: Secondary | ICD-10-CM | POA: Diagnosis not present

## 2019-08-24 DIAGNOSIS — N281 Cyst of kidney, acquired: Secondary | ICD-10-CM | POA: Diagnosis not present

## 2019-08-24 DIAGNOSIS — I120 Hypertensive chronic kidney disease with stage 5 chronic kidney disease or end stage renal disease: Secondary | ICD-10-CM | POA: Diagnosis not present

## 2019-08-24 DIAGNOSIS — D631 Anemia in chronic kidney disease: Secondary | ICD-10-CM | POA: Diagnosis not present

## 2019-08-24 DIAGNOSIS — N189 Chronic kidney disease, unspecified: Secondary | ICD-10-CM | POA: Diagnosis not present

## 2019-08-26 DIAGNOSIS — E7849 Other hyperlipidemia: Secondary | ICD-10-CM | POA: Diagnosis not present

## 2019-08-26 DIAGNOSIS — E038 Other specified hypothyroidism: Secondary | ICD-10-CM | POA: Diagnosis not present

## 2019-08-30 ENCOUNTER — Other Ambulatory Visit: Payer: Self-pay

## 2019-08-30 ENCOUNTER — Ambulatory Visit (HOSPITAL_COMMUNITY)
Admission: RE | Admit: 2019-08-30 | Discharge: 2019-08-30 | Disposition: A | Discharge status: Home / Self Care | Payer: Medicare Other | Source: Ambulatory Visit | Attending: Nephrology | Admitting: Nephrology

## 2019-08-30 VITALS — BP 151/66 | HR 90 | Temp 96.8°F | Resp 20

## 2019-08-30 DIAGNOSIS — N185 Chronic kidney disease, stage 5: Secondary | ICD-10-CM | POA: Diagnosis not present

## 2019-08-30 LAB — POCT HEMOGLOBIN-HEMACUE: Hemoglobin: 10.6 g/dL — ABNORMAL LOW (ref 12.0–15.0)

## 2019-08-30 MED ORDER — EPOETIN ALFA-EPBX 10000 UNIT/ML IJ SOLN
20000.0000 [IU] | INTRAMUSCULAR | Status: DC
  Administered 2019-08-30: 20000 [IU] via SUBCUTANEOUS

## 2019-08-30 MED ORDER — EPOETIN ALFA-EPBX 10000 UNIT/ML IJ SOLN
INTRAMUSCULAR | Status: AC
  Filled 2019-08-30: qty 2

## 2019-09-01 DIAGNOSIS — N189 Chronic kidney disease, unspecified: Secondary | ICD-10-CM | POA: Diagnosis not present

## 2019-09-01 DIAGNOSIS — N185 Chronic kidney disease, stage 5: Secondary | ICD-10-CM | POA: Diagnosis not present

## 2019-09-02 ENCOUNTER — Other Ambulatory Visit: Payer: Self-pay | Admitting: Internal Medicine

## 2019-09-02 DIAGNOSIS — I120 Hypertensive chronic kidney disease with stage 5 chronic kidney disease or end stage renal disease: Secondary | ICD-10-CM | POA: Diagnosis not present

## 2019-09-02 DIAGNOSIS — R82998 Other abnormal findings in urine: Secondary | ICD-10-CM | POA: Diagnosis not present

## 2019-09-02 DIAGNOSIS — Z0000 Encounter for general adult medical examination without abnormal findings: Secondary | ICD-10-CM | POA: Diagnosis not present

## 2019-09-02 DIAGNOSIS — Z1331 Encounter for screening for depression: Secondary | ICD-10-CM | POA: Diagnosis not present

## 2019-09-02 DIAGNOSIS — E039 Hypothyroidism, unspecified: Secondary | ICD-10-CM | POA: Diagnosis not present

## 2019-09-02 DIAGNOSIS — Z8719 Personal history of other diseases of the digestive system: Secondary | ICD-10-CM | POA: Diagnosis not present

## 2019-09-02 DIAGNOSIS — E785 Hyperlipidemia, unspecified: Secondary | ICD-10-CM | POA: Diagnosis not present

## 2019-09-02 DIAGNOSIS — N2581 Secondary hyperparathyroidism of renal origin: Secondary | ICD-10-CM | POA: Diagnosis not present

## 2019-09-02 DIAGNOSIS — N281 Cyst of kidney, acquired: Secondary | ICD-10-CM

## 2019-09-02 DIAGNOSIS — E875 Hyperkalemia: Secondary | ICD-10-CM | POA: Diagnosis not present

## 2019-09-02 DIAGNOSIS — D638 Anemia in other chronic diseases classified elsewhere: Secondary | ICD-10-CM | POA: Diagnosis not present

## 2019-09-02 DIAGNOSIS — N185 Chronic kidney disease, stage 5: Secondary | ICD-10-CM | POA: Diagnosis not present

## 2019-09-08 ENCOUNTER — Encounter (HOSPITAL_COMMUNITY): Payer: Self-pay | Admitting: Emergency Medicine

## 2019-09-08 ENCOUNTER — Observation Stay (HOSPITAL_BASED_OUTPATIENT_CLINIC_OR_DEPARTMENT_OTHER): Payer: Medicare Other

## 2019-09-08 ENCOUNTER — Other Ambulatory Visit: Payer: Self-pay

## 2019-09-08 ENCOUNTER — Emergency Department (HOSPITAL_COMMUNITY): Payer: Medicare Other

## 2019-09-08 ENCOUNTER — Inpatient Hospital Stay (HOSPITAL_COMMUNITY)
Admission: EM | Admit: 2019-09-08 | Discharge: 2019-09-11 | DRG: 291 | Disposition: A | Discharge status: Home / Self Care | Payer: Medicare Other | Attending: Internal Medicine | Admitting: Internal Medicine

## 2019-09-08 DIAGNOSIS — Z79890 Hormone replacement therapy (postmenopausal): Secondary | ICD-10-CM

## 2019-09-08 DIAGNOSIS — I151 Hypertension secondary to other renal disorders: Secondary | ICD-10-CM

## 2019-09-08 DIAGNOSIS — I491 Atrial premature depolarization: Secondary | ICD-10-CM | POA: Diagnosis not present

## 2019-09-08 DIAGNOSIS — E876 Hypokalemia: Secondary | ICD-10-CM | POA: Diagnosis present

## 2019-09-08 DIAGNOSIS — I5032 Chronic diastolic (congestive) heart failure: Secondary | ICD-10-CM

## 2019-09-08 DIAGNOSIS — I5033 Acute on chronic diastolic (congestive) heart failure: Secondary | ICD-10-CM | POA: Diagnosis not present

## 2019-09-08 DIAGNOSIS — J9621 Acute and chronic respiratory failure with hypoxia: Secondary | ICD-10-CM | POA: Diagnosis not present

## 2019-09-08 DIAGNOSIS — Z20822 Contact with and (suspected) exposure to covid-19: Secondary | ICD-10-CM | POA: Diagnosis not present

## 2019-09-08 DIAGNOSIS — I509 Heart failure, unspecified: Secondary | ICD-10-CM

## 2019-09-08 DIAGNOSIS — J810 Acute pulmonary edema: Secondary | ICD-10-CM | POA: Diagnosis not present

## 2019-09-08 DIAGNOSIS — Z880 Allergy status to penicillin: Secondary | ICD-10-CM

## 2019-09-08 DIAGNOSIS — R0601 Orthopnea: Secondary | ICD-10-CM | POA: Diagnosis not present

## 2019-09-08 DIAGNOSIS — Z8249 Family history of ischemic heart disease and other diseases of the circulatory system: Secondary | ICD-10-CM

## 2019-09-08 DIAGNOSIS — D539 Nutritional anemia, unspecified: Secondary | ICD-10-CM | POA: Diagnosis not present

## 2019-09-08 DIAGNOSIS — Z79899 Other long term (current) drug therapy: Secondary | ICD-10-CM

## 2019-09-08 DIAGNOSIS — I132 Hypertensive heart and chronic kidney disease with heart failure and with stage 5 chronic kidney disease, or end stage renal disease: Secondary | ICD-10-CM | POA: Diagnosis not present

## 2019-09-08 DIAGNOSIS — N185 Chronic kidney disease, stage 5: Secondary | ICD-10-CM | POA: Diagnosis present

## 2019-09-08 DIAGNOSIS — R0602 Shortness of breath: Secondary | ICD-10-CM | POA: Diagnosis not present

## 2019-09-08 DIAGNOSIS — Z79891 Long term (current) use of opiate analgesic: Secondary | ICD-10-CM

## 2019-09-08 DIAGNOSIS — R633 Feeding difficulties: Secondary | ICD-10-CM | POA: Diagnosis present

## 2019-09-08 DIAGNOSIS — N2889 Other specified disorders of kidney and ureter: Secondary | ICD-10-CM

## 2019-09-08 DIAGNOSIS — Z91013 Allergy to seafood: Secondary | ICD-10-CM

## 2019-09-08 DIAGNOSIS — Z881 Allergy status to other antibiotic agents status: Secondary | ICD-10-CM

## 2019-09-08 DIAGNOSIS — I10 Essential (primary) hypertension: Secondary | ICD-10-CM | POA: Diagnosis present

## 2019-09-08 DIAGNOSIS — R609 Edema, unspecified: Secondary | ICD-10-CM | POA: Diagnosis not present

## 2019-09-08 DIAGNOSIS — N189 Chronic kidney disease, unspecified: Secondary | ICD-10-CM | POA: Diagnosis present

## 2019-09-08 DIAGNOSIS — I447 Left bundle-branch block, unspecified: Secondary | ICD-10-CM | POA: Diagnosis not present

## 2019-09-08 DIAGNOSIS — Z86718 Personal history of other venous thrombosis and embolism: Secondary | ICD-10-CM

## 2019-09-08 DIAGNOSIS — R7989 Other specified abnormal findings of blood chemistry: Secondary | ICD-10-CM | POA: Diagnosis present

## 2019-09-08 DIAGNOSIS — D631 Anemia in chronic kidney disease: Secondary | ICD-10-CM | POA: Diagnosis present

## 2019-09-08 DIAGNOSIS — E039 Hypothyroidism, unspecified: Secondary | ICD-10-CM | POA: Diagnosis present

## 2019-09-08 DIAGNOSIS — R0609 Other forms of dyspnea: Secondary | ICD-10-CM | POA: Diagnosis present

## 2019-09-08 DIAGNOSIS — R778 Other specified abnormalities of plasma proteins: Secondary | ICD-10-CM | POA: Diagnosis not present

## 2019-09-08 DIAGNOSIS — Z882 Allergy status to sulfonamides status: Secondary | ICD-10-CM

## 2019-09-08 DIAGNOSIS — N186 End stage renal disease: Secondary | ICD-10-CM | POA: Diagnosis present

## 2019-09-08 DIAGNOSIS — E0500 Thyrotoxicosis with diffuse goiter without thyrotoxic crisis or storm: Secondary | ICD-10-CM | POA: Diagnosis not present

## 2019-09-08 DIAGNOSIS — E875 Hyperkalemia: Secondary | ICD-10-CM | POA: Diagnosis present

## 2019-09-08 DIAGNOSIS — Z9049 Acquired absence of other specified parts of digestive tract: Secondary | ICD-10-CM

## 2019-09-08 HISTORY — DX: Chronic diastolic (congestive) heart failure: I50.32

## 2019-09-08 LAB — URINALYSIS, ROUTINE W REFLEX MICROSCOPIC
Bilirubin Urine: NEGATIVE
Glucose, UA: NEGATIVE mg/dL
Hgb urine dipstick: NEGATIVE
Ketones, ur: NEGATIVE mg/dL
Nitrite: NEGATIVE
Protein, ur: 100 mg/dL — AB
Specific Gravity, Urine: 1.006 (ref 1.005–1.030)
pH: 8 (ref 5.0–8.0)

## 2019-09-08 LAB — CBC
HCT: 33.1 % — ABNORMAL LOW (ref 36.0–46.0)
Hemoglobin: 10.2 g/dL — ABNORMAL LOW (ref 12.0–15.0)
MCH: 31.3 pg (ref 26.0–34.0)
MCHC: 30.8 g/dL (ref 30.0–36.0)
MCV: 101.5 fL — ABNORMAL HIGH (ref 80.0–100.0)
Platelets: 175 10*3/uL (ref 150–400)
RBC: 3.26 MIL/uL — ABNORMAL LOW (ref 3.87–5.11)
RDW: 14.1 % (ref 11.5–15.5)
WBC: 6 10*3/uL (ref 4.0–10.5)
nRBC: 0 % (ref 0.0–0.2)

## 2019-09-08 LAB — BASIC METABOLIC PANEL
Anion gap: 14 (ref 5–15)
BUN: 56 mg/dL — ABNORMAL HIGH (ref 8–23)
CO2: 26 mmol/L (ref 22–32)
Calcium: 8.9 mg/dL (ref 8.9–10.3)
Chloride: 102 mmol/L (ref 98–111)
Creatinine, Ser: 4.39 mg/dL — ABNORMAL HIGH (ref 0.44–1.00)
GFR calc Af Amer: 10 mL/min — ABNORMAL LOW (ref >60)
GFR calc non Af Amer: 8 mL/min — ABNORMAL LOW (ref >60)
Glucose, Bld: 114 mg/dL — ABNORMAL HIGH (ref 70–99)
Potassium: 3.4 mmol/L — ABNORMAL LOW (ref 3.5–5.1)
Sodium: 142 mmol/L (ref 135–145)

## 2019-09-08 LAB — BRAIN NATRIURETIC PEPTIDE: B Natriuretic Peptide: 1601.8 pg/mL — ABNORMAL HIGH (ref 0.0–100.0)

## 2019-09-08 LAB — ECHOCARDIOGRAM COMPLETE
Height: 66 in
Weight: 1904 oz

## 2019-09-08 LAB — RESPIRATORY PANEL BY RT PCR (FLU A&B, COVID)
Influenza A by PCR: NEGATIVE
Influenza B by PCR: NEGATIVE
SARS Coronavirus 2 by RT PCR: NEGATIVE

## 2019-09-08 LAB — VITAMIN B12: Vitamin B-12: 535 pg/mL (ref 180–914)

## 2019-09-08 LAB — TROPONIN I (HIGH SENSITIVITY)
Troponin I (High Sensitivity): 126 ng/L (ref <18)
Troponin I (High Sensitivity): 105 ng/L (ref <18)

## 2019-09-08 LAB — FOLATE: Folate: 68.1 ng/mL (ref >5.9)

## 2019-09-08 MED ORDER — POTASSIUM CHLORIDE CRYS ER 20 MEQ PO TBCR
10.0000 meq | EXTENDED_RELEASE_TABLET | Freq: Once | ORAL | Status: AC
  Administered 2019-09-08: 10 meq via ORAL
  Filled 2019-09-08: qty 1

## 2019-09-08 MED ORDER — FUROSEMIDE 10 MG/ML IJ SOLN
40.0000 mg | Freq: Two times a day (BID) | INTRAMUSCULAR | Status: DC
  Administered 2019-09-08 – 2019-09-10 (×4): 40 mg via INTRAVENOUS
  Filled 2019-09-08 (×4): qty 4

## 2019-09-08 MED ORDER — ONDANSETRON HCL 4 MG PO TABS: 4.0000 mg | ORAL_TABLET | Freq: Four times a day (QID) | ORAL | Status: DC | PRN

## 2019-09-08 MED ORDER — ACETAMINOPHEN 650 MG RE SUPP: 650.0000 mg | Freq: Four times a day (QID) | RECTAL | Status: DC | PRN

## 2019-09-08 MED ORDER — ENOXAPARIN SODIUM 30 MG/0.3ML ~~LOC~~ SOLN
30.0000 mg | Freq: Every day | SUBCUTANEOUS | Status: DC
  Administered 2019-09-08 – 2019-09-10 (×3): 30 mg via SUBCUTANEOUS
  Filled 2019-09-08 (×3): qty 0.3

## 2019-09-08 MED ORDER — ACETAMINOPHEN 325 MG PO TABS: 650.0000 mg | ORAL_TABLET | Freq: Four times a day (QID) | ORAL | Status: DC | PRN

## 2019-09-08 MED ORDER — NITROGLYCERIN 2 % TD OINT
0.5000 [in_us] | TOPICAL_OINTMENT | Freq: Once | TRANSDERMAL | Status: AC
  Administered 2019-09-08: 0.5 [in_us] via TOPICAL
  Filled 2019-09-08: qty 1

## 2019-09-08 MED ORDER — SODIUM ZIRCONIUM CYCLOSILICATE 10 G PO PACK: 10.0000 g | PACK | ORAL | Status: DC

## 2019-09-08 MED ORDER — POLYVINYL ALCOHOL 1.4 % OP SOLN
1.0000 [drp] | Freq: Every day | OPHTHALMIC | Status: DC | PRN
  Filled 2019-09-08: qty 15

## 2019-09-08 MED ORDER — FUROSEMIDE 10 MG/ML IJ SOLN
80.0000 mg | Freq: Once | INTRAMUSCULAR | Status: AC
  Administered 2019-09-08: 80 mg via INTRAVENOUS
  Filled 2019-09-08: qty 8

## 2019-09-08 MED ORDER — RENA-VITE PO TABS
1.0000 | ORAL_TABLET | Freq: Every day | ORAL | Status: DC
  Administered 2019-09-08 – 2019-09-11 (×4): 1 via ORAL
  Filled 2019-09-08 (×4): qty 1

## 2019-09-08 MED ORDER — DOXAZOSIN MESYLATE 8 MG PO TABS
8.0000 mg | ORAL_TABLET | Freq: Every day | ORAL | Status: DC
  Administered 2019-09-08 – 2019-09-11 (×4): 8 mg via ORAL
  Filled 2019-09-08 (×5): qty 1

## 2019-09-08 MED ORDER — ONDANSETRON HCL 4 MG/2ML IJ SOLN: 4.0000 mg | Freq: Four times a day (QID) | INTRAMUSCULAR | Status: DC | PRN

## 2019-09-08 MED ORDER — LEVOTHYROXINE SODIUM 88 MCG PO TABS
88.0000 ug | ORAL_TABLET | Freq: Every day | ORAL | Status: DC
  Administered 2019-09-09 – 2019-09-11 (×3): 88 ug via ORAL
  Filled 2019-09-08 (×3): qty 1

## 2019-09-08 MED ORDER — TRIAMCINOLONE ACETONIDE 55 MCG/ACT NA AERO
2.0000 | INHALATION_SPRAY | Freq: Every day | NASAL | Status: DC | PRN
  Administered 2019-09-10: 2 via NASAL
  Filled 2019-09-08: qty 21.6

## 2019-09-08 MED ORDER — VITAMIN D 25 MCG (1000 UNIT) PO TABS
2000.0000 [IU] | ORAL_TABLET | Freq: Every day | ORAL | Status: DC
  Administered 2019-09-08 – 2019-09-11 (×4): 2000 [IU] via ORAL
  Filled 2019-09-08 (×4): qty 2

## 2019-09-08 MED ORDER — DILTIAZEM HCL ER COATED BEADS 300 MG PO CP24
300.0000 mg | ORAL_CAPSULE | Freq: Every day | ORAL | Status: DC
  Administered 2019-09-09 – 2019-09-11 (×3): 300 mg via ORAL
  Filled 2019-09-08 (×3): qty 1

## 2019-09-08 MED ORDER — SODIUM CHLORIDE 0.9% FLUSH: 3.0000 mL | Freq: Once | INTRAVENOUS | Status: DC

## 2019-09-08 MED ORDER — DOCUSATE SODIUM 100 MG PO CAPS: 100.0000 mg | ORAL_CAPSULE | Freq: Every day | ORAL | Status: DC | PRN

## 2019-09-08 MED ORDER — CALCITRIOL 0.25 MCG PO CAPS
0.2500 ug | ORAL_CAPSULE | ORAL | Status: DC
  Administered 2019-09-09: 0.25 ug via ORAL
  Filled 2019-09-08: qty 1

## 2019-09-08 NOTE — ED Triage Notes (Signed)
Pt from home, c/o bilateral lower leg edema this morning, this afternoon she tried to go to sleep and found herself very short of breath.  Believes she has gained some weight over the past few days (pants fitting tighter)

## 2019-09-08 NOTE — H&P (Signed)
History and Physical    [name] VEL:[id number] DOB: 1928-09-14 DOA: 09/08/2019  PCP: Patient, No Pcp Per   Patient coming from: Home.  I have personally briefly reviewed patient's old medical records in [place]  Chief Complaint: Shortness of breath.  HPI: Yvonne Powell is a 86 y.o. female with medical history significant of anemia, stage V CKD, history of DVT, gallstones, hypertension, hypothyroid this then after having Graves' disease who is coming to the emergency department due to dyspnea and lower extremity edema since this morning.  However, the patient states that for the previous 2 days she was feeling very tired, but at that time she had not noticed any lower extremity edema.  She has had some orthopnea.  She also stated that she has noticed she has difficulty feeding her close today and thinks that she is retaining some fluid in her abdomen.  She denies chest pain, palpitations, dizziness, diaphoresis, PND.  Fever, chills, rhinorrhea, sore throat, productive cough, wheezing or hemoptysis.  No abdominal pain, nausea, emesis, recent diarrhea or constipation, melena or hematochezia.  No dysuria, frequency or hematuria.  No polyuria, polydipsia, polyphagia or blurred vision.  ED Course: Initial vital signs were temperature 97.6 F, pulse 74, respirations 16, blood pressure 164/60 mmHg and O2 sat 97% on room air.  The patient had an nitro paste patch placed in the ED.  I added furosemide 80 mg IVP x1.  She stated that she feels better.  White count 6.0, hemoglobin 10.2 g/dL and platelets 175.  Sodium 142, potassium 3.4, chloride 102 and CO2 26 mmol/L.  Glucose 114, BUN 56, creatinine 4.39 and calcium 8.9 mg/dL.  Chest radiograph shows pulmonary edema with small left effusion.  More patchy opacity in the left lung base could reflect alveolar edema versus infection.  Review of Systems: As per HPI otherwise all other systems reviewed and are negative  Past Medical History:    Diagnosis Date  . Anemia    due to kidney  . DVT (deep venous thrombosis) (HCC)    hx  . Gallstones 12/2018  . Hypertension   . Renal disorder   . Thyroid disease    graves dx    Past Surgical History:  Procedure Laterality Date  . AV FISTULA PLACEMENT Right 04/07/2017   Procedure: ARTERIOVENOUS (AV) FISTULA CREATION;  Surgeon: Elam Dutch, MD;  Location: [facility];  Service: Vascular;  Laterality: Right;  . CHOLECYSTECTOMY N/A 12/23/2018   Procedure: LAPAROSCOPIC CHOLECYSTECTOMY;  Surgeon: Erroll Luna, MD;  Location: [facility];  Service: General;  Laterality: N/A;  . COLONOSCOPY    . DILATION AND CURETTAGE OF UTERUS    . EYE SURGERY     cactaracts  . LIPOMA EXCISION    . LUMBAR EPIDURAL INJECTION  2018    Social History  reports that she has never smoked. She has never used smokeless tobacco. She reports that she does not drink alcohol or use drugs.  Allergies  Allergen Reactions  . Doxycycline Anaphylaxis  . Clindamycin/Lincomycin Diarrhea  . Compazine [Prochlorperazine Edisylate] Other (See Comments)    Makes her want to tear off her skin  . Iodine     All seafood  . Sulfa Antibiotics Hives  . Penicillins Rash        Family History  Problem Relation Age of Onset  . Congestive Heart Failure Maternal Grandmother   . Hypertension Maternal Grandmother    Prior to Admission medications   Medication Sig Start Date End Date Taking? Authorizing Provider  calcitRIOL (ROCALTROL) 0.25 MCG capsule Take 0.25 mcg by mouth 2 (two) times a week. Monday, Friday    [provider]  Cholecalciferol (VITAMIN D) 2000 units tablet Take 2,000 Units by mouth daily.    [provider]  diltiazem (TIAZAC) 300 MG 24 hr capsule Take 300 mg by mouth daily.    [provider]  docusate sodium (COLACE) 100 MG capsule Take 100 mg by mouth daily as needed for moderate constipation.     [provider]  doxazosin (CARDURA) 8 MG tablet Take 8 mg by mouth  daily.    [provider]  famotidine (PEPCID) 20 MG tablet Take 20 mg by mouth once.    [provider]  hydroxypropyl methylcellulose / hypromellose (ISOPTO TEARS / GONIOVISC) 2.5 % ophthalmic solution Place 1 drop into both eyes as needed for dry eyes.    [provider]  Iron-FA-DSS-B Cmplx-Vit C (NEPHRON FA PO) Take 1 tablet by mouth daily.    [provider]  levothyroxine (SYNTHROID, LEVOTHROID) 88 MCG tablet Take 88 mcg by mouth daily before breakfast.    [provider]  ondansetron (ZOFRAN) 4 MG tablet Take 1 tablet (4 mg total) by mouth every 6 (six) hours as needed for nausea. 12/28/18   Mikhail, Velta Addison, DO  OVER THE COUNTER MEDICATION Take 2.5 mLs by mouth daily. Sodium Bicarb powder in 4 0z of H20    [provider]  oxyCODONE-acetaminophen (ROXICET) 5-325 MG tablet Take 1 tablet by mouth every 8 (eight) hours as needed for severe pain. Patient not taking: Reported on 12/20/2018 04/07/17   Gabriel Earing, PA-C    Physical Exam: Vitals:   09/08/19 0330 09/08/19 0345 09/08/19 0400 09/08/19 0415  BP: (!) 169/72 (!) 179/66 (!) 176/66 (!) 173/71  Pulse: 72 72 71 67  Resp:      Temp:      TempSrc:      SpO2: 97% 96% 96% 97%  Weight:      Height:        Constitutional: NAD, calm, comfortable Eyes: PERRL, lids and conjunctivae normal ENMT: Mucous membranes are dry. Posterior pharynx clear of any exudate or lesions. Neck: normal, supple, no masses, no thyromegaly.  No JVD. Respiratory: Bibasilar crackles.  No wheezing.  Normal respiratory effort. No accessory muscle use.  Cardiovascular: Regular rate and rhythm, no murmurs / rubs / gallops.  Bilateral lower extremity pitting edema. 2+ pedal pulses. No carotid bruits.  Abdomen: Nondistended.  Soft, no tenderness, no masses palpated. No hepatosplenomegaly. Bowel sounds positive.  Musculoskeletal: no clubbing / cyanosis. Good ROM, no contractures. Normal muscle tone.  Skin: no  clinically significant rashes, lesions, ulcers on on very limited dermatological exam. Neurologic: CN 2-12 grossly intact. Sensation intact, DTR normal. Strength 5/5 in all 4.  Psychiatric: Normal judgment and insight. Alert and oriented x 3. Normal mood.   Labs on Admission: I have personally reviewed following labs and imaging studies  CBC: Recent Labs  Lab 09/08/19 0155  WBC 6.0  HGB 10.2*  HCT 33.1*  MCV 101.5*  PLT 423    Basic Metabolic Panel: Recent Labs  Lab 09/08/19 0155  NA 142  K 3.4*  CL 102  CO2 26  GLUCOSE 114*  BUN 56*  CREATININE 4.39*  CALCIUM 8.9    GFR: Estimated Creatinine Clearance: 7.4 mL/min (A) (by C-G formula based on SCr of 4.39 mg/dL (H)).  Liver Function Tests: No results for input(s): AST, ALT, ALKPHOS, BILITOT, PROT, ALBUMIN in  the last 168 hours.  Urine analysis: No results found for: COLORURINE, APPEARANCEUR, LABSPEC, [place], GLUCOSEU, HGBUR, BILIRUBINUR, KETONESUR, PROTEINUR, UROBILINOGEN, NITRITE, LEUKOCYTESUR  Radiological Exams on Admission: DG Chest 2 View  Result Date: 09/08/2019 CLINICAL DATA:  Shortness of breath EXAM: CHEST - 2 VIEW COMPARISON:  Radiograph 11/07/2015 FINDINGS: Diffuse fine reticular opacities throughout the lungs with central vascular congestion, fissural and septal thickening and a small left pleural effusion. More patchy opacity in the left lung base partially silhouettes the left heart border and diaphragm. Stable cardiomediastinal silhouette with a calcified aorta. The aorta is calcified. The remaining cardiomediastinal contours are unremarkable. Calcification in the right axillary region could reflect a mineralized joint body. IMPRESSION: 1. Findings suggest pulmonary edema with small left effusion. 2. More patchy opacity in the left lung base could reflect alveolar edema versus infection. Electronically Signed   By: Lovena Le M.D.   On: 09/08/2019 02:06    EKG: Independently reviewed. Vent. rate 86  BPM PR interval 194 ms QRS duration 154 ms QT/QTc 456/545 ms P-R-T axes 69 14 44 Normal sinus rhythm Left bundle branch block Abnormal ECG No acute changes  Assessment/Plan Principal Problem:   New onset of congestive heart failure (HCC) Observation/telemetry. Continue supplemental oxygen. Continue Nitropaste. Furosemide 80 mg IVP x1 dose. Follow-up daily weights, intake and output. Follow-up renal function electrolytes. Check echocardiogram. Consider cardiology consult.  Active Problems:   Elevated troponin Likely demand ischemia. Check echocardiogram    Hypokalemia Replaced gently.    Hypertension Hold Cardizem due to acute exacerbation. Continue to assess was seeing a milligrams p.o. daily. May use as needed hydralazine.    CKD (chronic kidney disease) stage 5, GFR less than 15 ml/min (HCC) She follows with Dr. Hollie Salk. Monitor renal function electrolytes.    Macrocytic anemia Check T01 and folic acid level. Monitor hematocrit and hemoglobin.    Hypothyroidism Continue Synthroid 88 mcg p.o. daily.   DVT prophylaxis: Lovenox SQ. Code Status:   Full code. Family Communication: Disposition Plan:   Patient is from:  Home.  Anticipated DC to:  Home.  Anticipated DC date:  09/09/2019.  Anticipated DC barriers: Clinical improvement.  Consults called: Admission status:  Observation/telemetry.   Severity of Illness: Moderate severity.  Reubin Milan MD [facility]  How to contact the [facility] Attending or Consulting provider [place] or covering provider during after hours [place], for this patient?   1. Check the care team in [facility] and look for a) attending/consulting [facility] provider listed and b) the [facility] team listed 2. Log into [email or web address] and use 's universal password to access. If you do not have the password, please contact the hospital operator. 3. Locate the [facility] provider you are looking for under [facility] and page to a number  that you can be directly reached. 4. If you still have difficulty reaching the provider, please page the [facility] (Director on Call) for the Hospitalists listed on amion for assistance.  09/08/2019, 5:16 AM   This document was prepared using Dragon voice recognition software and may contain some unintended transcription errors.

## 2019-09-08 NOTE — ED Notes (Signed)
Lunch Tray Ordered @ 1042. 

## 2019-09-08 NOTE — ED Provider Notes (Signed)
[facility] EMERGENCY DEPARTMENT Provider Note   CSN: [id number] Arrival date & time: 09/08/19  0134     History Chief Complaint  Patient presents with  . Shortness of Breath  . Leg Swelling    Yvonne Powell is a 86 y.o. female.  Patient presents to the emergency department for evaluation of shortness of breath and lower extremity swelling.  Patient reports that she noticed some swelling in her legs this morning when she woke up.  She has been very tired all day.  She noticed that her clothes fit tighter today than normally, thinks she might of put on some weight.  When she laid down to go to bed tonight she could not get to sleep because she felt short of breath.  She is not experiencing any associated chest pain.        Past Medical History:  Diagnosis Date  . Anemia    due to kidney  . DVT (deep venous thrombosis) (HCC)    hx  . Gallstones 12/2018  . Hypertension   . Renal disorder   . Thyroid disease    graves dx    Patient Active Problem List   Diagnosis Date Noted  . CKD (chronic kidney disease) stage 5, GFR less than 15 ml/min (HCC) 12/21/2018  . Cholelithiasis 12/21/2018  . Intractable generalized abdominal pain 12/20/2018    Past Surgical History:  Procedure Laterality Date  . AV FISTULA PLACEMENT Right 04/07/2017   Procedure: ARTERIOVENOUS (AV) FISTULA CREATION;  Surgeon: Elam Dutch, MD;  Location: [facility];  Service: Vascular;  Laterality: Right;  . CHOLECYSTECTOMY N/A 12/23/2018   Procedure: LAPAROSCOPIC CHOLECYSTECTOMY;  Surgeon: Erroll Luna, MD;  Location: [facility];  Service: General;  Laterality: N/A;  . COLONOSCOPY    . DILATION AND CURETTAGE OF UTERUS    . EYE SURGERY     cactaracts  . LIPOMA EXCISION    . LUMBAR EPIDURAL INJECTION  2018     OB History   No obstetric history on file.     No family history on file.  Social History   Tobacco Use  . Smoking status: Never Smoker  . Smokeless tobacco: Never Used    Substance Use Topics  . Alcohol use: No  . Drug use: No    Home Medications Prior to Admission medications   Medication Sig Start Date End Date Taking? Authorizing Provider  calcitRIOL (ROCALTROL) 0.25 MCG capsule Take 0.25 mcg by mouth 2 (two) times a week. Monday, Friday    [provider]  Cholecalciferol (VITAMIN D) 2000 units tablet Take 2,000 Units by mouth daily.    [provider]  diltiazem (TIAZAC) 300 MG 24 hr capsule Take 300 mg by mouth daily.    [provider]  docusate sodium (COLACE) 100 MG capsule Take 100 mg by mouth daily as needed for moderate constipation.     [provider]  doxazosin (CARDURA) 8 MG tablet Take 8 mg by mouth daily.    [provider]  famotidine (PEPCID) 20 MG tablet Take 20 mg by mouth once.    [provider]  hydroxypropyl methylcellulose / hypromellose (ISOPTO TEARS / GONIOVISC) 2.5 % ophthalmic solution Place 1 drop into both eyes as needed for dry eyes.    [provider]  Iron-FA-DSS-B Cmplx-Vit C (NEPHRON FA PO) Take 1 tablet by mouth daily.    [provider]  levothyroxine (SYNTHROID, LEVOTHROID) 88 MCG tablet Take 88 mcg by mouth daily before breakfast.  [provider]  ondansetron (ZOFRAN) 4 MG tablet Take 1 tablet (4 mg total) by mouth every 6 (six) hours as needed for nausea. 12/28/18   Mikhail, Velta Addison, DO  OVER THE COUNTER MEDICATION Take 2.5 mLs by mouth daily. Sodium Bicarb powder in 4 0z of H20    [provider]  oxyCODONE-acetaminophen (ROXICET) 5-325 MG tablet Take 1 tablet by mouth every 8 (eight) hours as needed for severe pain. Patient not taking: Reported on 12/20/2018 04/07/17   Gabriel Earing, PA-C    Allergies    Doxycycline, Clindamycin/lincomycin, Compazine [prochlorperazine edisylate], Iodine, Sulfa antibiotics, and Penicillins  Review of Systems   Review of Systems  Constitutional: Positive for fatigue.  Respiratory:  Positive for shortness of breath.   Cardiovascular: Positive for leg swelling. Negative for chest pain.  All other systems reviewed and are negative.   Physical Exam Updated Vital Signs BP (!) 164/60 (BP Location: Left Arm)   Pulse 74   Temp 97.6 F (36.4 C) (Oral)   Resp 16   Ht 5\' 6"  (1.676 m)   Wt 54 kg   SpO2 97%   BMI 19.21 kg/m   Physical Exam Vitals and nursing note reviewed.  Constitutional:      General: She is not in acute distress.    Appearance: Normal appearance. She is well-developed.  HENT:     Head: Normocephalic and atraumatic.     Right Ear: Hearing normal.     Left Ear: Hearing normal.     Nose: Nose normal.  Eyes:     Conjunctiva/sclera: Conjunctivae normal.     Pupils: Pupils are equal, round, and reactive to light.  Cardiovascular:     Rate and Rhythm: Regular rhythm.     Pulses:          Dorsalis pedis pulses are 1+ on the right side and 1+ on the left side.     Heart sounds: S1 normal and S2 normal. No murmur. No friction rub. Gallop present. S4 sounds present.      Arteriovenous access: right arteriovenous access is present. Pulmonary:     Effort: Pulmonary effort is normal. No respiratory distress.     Breath sounds: Normal breath sounds.  Chest:     Chest wall: No tenderness.  Abdominal:     General: Bowel sounds are normal.     Palpations: Abdomen is soft.     Tenderness: There is no abdominal tenderness. There is no guarding or rebound. Negative signs include Murphy's sign and McBurney's sign.     Hernia: No hernia is present.  Musculoskeletal:        General: Normal range of motion.     Cervical back: Normal range of motion and neck supple.     Right lower leg: 1+ Pitting Edema present.     Left lower leg: 1+ Pitting Edema present.  Skin:    General: Skin is warm and dry.     Findings: No rash.  Neurological:     Mental Status: She is alert and oriented to person, place, and time.     GCS: GCS eye subscore is 4. GCS verbal  subscore is 5. GCS motor subscore is 6.     Cranial Nerves: No cranial nerve deficit.     Sensory: No sensory deficit.     Coordination: Coordination normal.  Psychiatric:        Speech: Speech normal.        Behavior: Behavior normal.  Thought Content: Thought content normal.     ED Results / Procedures / Treatments   Labs (all labs ordered are listed, but only abnormal results are displayed) Labs Reviewed  BASIC METABOLIC PANEL - Abnormal; Notable for the following components:      Result Value   Potassium 3.4 (*)    Glucose, Bld 114 (*)    BUN 56 (*)    Creatinine, Ser 4.39 (*)    GFR calc non Af Amer 8 (*)    GFR calc Af Amer 10 (*)    All other components within normal limits  CBC - Abnormal; Notable for the following components:   RBC 3.26 (*)    Hemoglobin 10.2 (*)    HCT 33.1 (*)    MCV 101.5 (*)    All other components within normal limits  TROPONIN I (HIGH SENSITIVITY) - Abnormal; Notable for the following components:   Troponin I (High Sensitivity) 126 (*)    All other components within normal limits  BRAIN NATRIURETIC PEPTIDE  URINALYSIS, ROUTINE W REFLEX MICROSCOPIC  TROPONIN I (HIGH SENSITIVITY)    EKG EKG Interpretation  Date/Time:  Thursday Sep 08 2019 01:36:39 EDT Ventricular Rate:  86 PR Interval:  194 QRS Duration: 154 QT Interval:  456 QTC Calculation: 545 R Axis:   14 Text Interpretation: Normal sinus rhythm Left bundle branch block Abnormal ECG No acute changes Confirmed by Addison Lank [phone]) on 09/08/2019 1:46:10 AM   Radiology DG Chest 2 View  Result Date: 09/08/2019 CLINICAL DATA:  Shortness of breath EXAM: CHEST - 2 VIEW COMPARISON:  Radiograph 11/07/2015 FINDINGS: Diffuse fine reticular opacities throughout the lungs with central vascular congestion, fissural and septal thickening and a small left pleural effusion. More patchy opacity in the left lung base partially silhouettes the left heart border and diaphragm. Stable  cardiomediastinal silhouette with a calcified aorta. The aorta is calcified. The remaining cardiomediastinal contours are unremarkable. Calcification in the right axillary region could reflect a mineralized joint body. IMPRESSION: 1. Findings suggest pulmonary edema with small left effusion. 2. More patchy opacity in the left lung base could reflect alveolar edema versus infection. Electronically Signed   By: Lovena Le M.D.   On: 09/08/2019 02:06    Procedures Procedures (including critical care time)  Medications Ordered in ED Medications  sodium chloride flush (NS) 0.9 % injection 3 mL (has no administration in time range)    ED Course  I have reviewed the triage vital signs and the nursing notes.  Pertinent labs & imaging results that were available during my care of the patient were reviewed by me and considered in my medical decision making (see chart for details).    MDM Rules/Calculators/A&P                      Patient presents to the emergency department for evaluation of difficulty breathing with weight gain and leg swelling.  She really noticed the symptoms over the last couple of days, more so today.  She could not sleep tonight because of orthopnea which brought her to the ER.  Work-up was consistent with congestive heart failure.  She does have a history of chronic kidney disease and hypertension, but no known history of CAD or CHF.  Troponin is elevated at 126, likely leak secondary to her congestive heart failure.  Diuresis will be difficult because of her chronic kidney disease, will administer Lasix and will require hospitalization for further management.  Final Clinical Impression(s) /  ED Diagnoses Final diagnoses:  Acute pulmonary edema [facility])    Rx / DC Orders ED Discharge Orders    None       Devone Tousley, Gwenyth Allegra, MD 09/08/19 [phone]

## 2019-09-08 NOTE — ED Notes (Signed)
Eco completed at pt. Bedside at this time.

## 2019-09-08 NOTE — Progress Notes (Signed)
  Echocardiogram 2D Echocardiogram has been performed.  Yvonne Powell 09/08/2019, 9:07 AM

## 2019-09-08 NOTE — Consult Note (Signed)
Cardiology Consultation:   Patient ID: Yvonne Powell; [id number]; [birth year]   Admit date: 09/08/2019 Date of Consult: 09/08/2019  Primary Care Provider: Leeroy Cha, MD Primary Cardiologist: Skeet Latch, MD New Primary Electrophysiologist:  None   Patient Profile:   Yvonne Powell is a 86 y.o. female with a hx of HTN, DVT, CKD V, anemia chronic dz, Graves dz>>hypothyroid, DVT, who is being seen today for the evaluation of CHF at the request of Dr Lupita Leash.  History of Present Illness:   Yvonne Powell has been followed by Nephrology for CKD, progressing to stage V. She had R brachial AV fistula 04/2017.  She sees Dr. Hollie Salk regularly.  She still makes urine.  Dr. Hollie Salk has not felt she needs to be started on dialysis yet.  Yvonne Powell saw her PCP last Friday, and was doing well.  She does some light exercise on a regular basis.  She does not do anything strenuous, but has not noticed dyspnea on exertion until recently.  She has never had chest pain.  Yvonne Powell has noticed some increasing dyspnea on exertion over the last few days.  She has been gradually developing lower extremity edema.  She started having to use extra pillows in order to breathe at night when she went to bed.  Then she started waking up early in the morning and could not get back to sleep, possibly because of her breathing.  Last night when she went to bed, she was having trouble going to sleep because she could not lie down.  She realized something was wrong and called her son to take her to the emergency room.  In the emergency room, she has gotten Lasix 80 mg IV, nitroglycerin paste and potassium.  She is breathing more easily and feels much better.   Past Medical History:  Diagnosis Date   Anemia    due to kidney   DVT (deep venous thrombosis) (HCC)    hx   Gallstones 12/2018   Hypertension    Renal disorder    Thyroid disease    graves dx    Past Surgical History:  Procedure Laterality Date     AV FISTULA PLACEMENT Right 04/07/2017   Procedure: ARTERIOVENOUS (AV) FISTULA CREATION;  Surgeon: Elam Dutch, MD;  Location: [facility];  Service: Vascular;  Laterality: Right;   CHOLECYSTECTOMY N/A 12/23/2018   Procedure: LAPAROSCOPIC CHOLECYSTECTOMY;  Surgeon: Erroll Luna, MD;  Location: [facility];  Service: General;  Laterality: N/A;   COLONOSCOPY     DILATION AND CURETTAGE OF UTERUS     EYE SURGERY     cactaracts   LIPOMA EXCISION     LUMBAR EPIDURAL INJECTION  2018     Prior to Admission medications   Medication Sig Start Date End Date Taking? Authorizing Provider  calcitRIOL (ROCALTROL) 0.25 MCG capsule Take 0.25 mcg by mouth 2 (two) times a week. Monday, Friday   Yes [provider]  Cholecalciferol (VITAMIN D) 2000 units tablet Take 2,000 Units by mouth daily.   Yes [provider]  diltiazem (TIAZAC) 300 MG 24 hr capsule Take 300 mg by mouth daily.   Yes [provider]  doxazosin (CARDURA) 8 MG tablet Take 8 mg by mouth daily.   Yes [provider]  Iron-FA-DSS-B Cmplx-Vit C (NEPHRON FA PO) Take 1 tablet by mouth daily.   Yes [provider]  levothyroxine (SYNTHROID, LEVOTHROID) 88 MCG tablet Take 88 mcg by mouth daily before breakfast.   Yes [provider]  [facility]  10 g PACK packet Take 1 packet by mouth every other day.  08/26/19  Yes [provider]  OVER THE COUNTER MEDICATION Take 2.5 mLs by mouth daily. Sodium Bicarb powder in 4 oz of water   Yes [provider]  Polyethyl Glycol-Propyl Glycol (SYSTANE) 0.4-0.3 % SOLN Place 1 drop into both eyes daily as needed (Dry eyes).   Yes [provider]  triamcinolone (NASACORT ALLERGY 24HR) 55 MCG/ACT AERO nasal inhaler Place 2 sprays into the nose daily as needed (Allergies).    Yes [provider]  docusate sodium (COLACE) 100 MG capsule Take 100 mg by mouth daily as needed for moderate constipation.     [provider]   ondansetron (ZOFRAN) 4 MG tablet Take 1 tablet (4 mg total) by mouth every 6 (six) hours as needed for nausea. Patient not taking: Reported on 09/08/2019 12/28/18   Cristal Ford, DO  oxyCODONE-acetaminophen (ROXICET) 5-325 MG tablet Take 1 tablet by mouth every 8 (eight) hours as needed for severe pain. Patient not taking: Reported on 12/20/2018 04/07/17   Gabriel Earing, PA-C    Inpatient Medications: Scheduled Meds:  enoxaparin (LOVENOX) injection  30 mg Subcutaneous Daily   sodium chloride flush  3 mL Intravenous Once   Continuous Infusions:  PRN Meds: acetaminophen **OR** acetaminophen, ondansetron **OR** ondansetron (ZOFRAN) IV  Allergies:    Allergies  Allergen Reactions   Doxycycline Anaphylaxis   Fish Allergy Anaphylaxis   Shellfish Allergy Anaphylaxis   Clindamycin/Lincomycin Diarrhea   Compazine [Prochlorperazine Edisylate] Other (See Comments)    Makes her want to tear off her skin   Iodine     All seafood   Sulfa Antibiotics Hives   Penicillins Rash    Social History:   Social History   Socioeconomic History   Marital status: Divorced    Spouse name: Not on file   Number of children: Not on file   Years of education: Not on file   Highest education level: Not on file  Occupational History   Not on file  Tobacco Use   Smoking status: Never Smoker   Smokeless tobacco: Never Used  Substance and Sexual Activity   Alcohol use: No   Drug use: No   Sexual activity: Not on file  Other Topics Concern   Not on file  Social History Narrative   Not on file   Social Determinants of Health   Financial Resource Strain:    Difficulty of Paying Living Expenses:   Food Insecurity:    Worried About Charity fundraiser in the Last Year:    Arboriculturist in the Last Year:   Transportation Needs:    Film/video editor (Medical):    Lack of Transportation (Non-Medical):   Physical Activity:    Days of Exercise per Week:     Minutes of Exercise per Session:   Stress:    Feeling of Stress :   Social Connections:    Frequency of Communication with Friends and Family:    Frequency of Social Gatherings with Friends and Family:    Attends Religious Services:    Active Member of Clubs or Organizations:    Attends Music therapist:    Marital Status:   Intimate Partner Violence:    Fear of Current or Ex-Partner:    Emotionally Abused:    Physically Abused:    Sexually Abused:     Family History:   Family History  Problem Relation Age of Onset  Congestive Heart Failure Maternal Grandmother 76   Hypertension Maternal Grandmother    Family Status:  Family Status  Relation Name Status   MGM  Deceased    ROS:  Please see the history of present illness.  All other ROS reviewed and negative.     Physical Exam/Data:   Vitals:   09/08/19 1000 09/08/19 1100 09/08/19 1117 09/08/19 1214  BP: (!) 144/94 (!) 156/60 (!) 156/60 (!) 165/88  Pulse: 78 63 64 69  Resp: (!) 24 13 15 16   Temp:      TempSrc:      SpO2: 97% 96% 98% 99%  Weight:      Height:        Intake/Output Summary (Last 24 hours) at 09/08/2019 1228 Last data filed at 09/08/2019 1030 Gross per 24 hour  Intake --  Output 201 ml  Net -201 ml    Last 3 Weights 09/08/2019 12/28/2018 12/26/2018  Weight (lbs) 119 lb 137 lb 4.8 oz 136 lb 4.8 oz  Weight (kg) 53.978 kg 62.279 kg 61.825 kg     Body mass index is 19.21 kg/m.   General:  Well nourished, well developed, female in no acute distress HEENT: normal Lymph: no adenopathy Neck: JVD -10-12 cm Endocrine:  No thryomegaly Vascular: No carotid bruits; 4/4 extremity pulses 2+  Cardiac:  normal S1, S2; RRR; 2/6 murmur Lungs: Rales bases bilaterally, no wheezing, rhonchi   Abd: soft, nontender, no hepatomegaly  Ext: Trace-1+ lower extremity edema; right upper extremity with AV fistula, good pulse and thrill Musculoskeletal:  No deformities, BUE and BLE strength normal  and equal Skin: warm and dry  Neuro:  CNs 2-12 intact, no focal abnormalities noted Psych:  Normal affect   EKG:  The EKG was personally reviewed and demonstrates:  SR, HR 86, LBBB seen, was intermittent 12/2018 ECG Telemetry:  Telemetry was personally reviewed and demonstrates: Sinus rhythm with intermittent left bundle   CV studies:   ECHO: results pending  CATH: n/a   Laboratory Data:   Chemistry Recent Labs  Lab 09/08/19 0155  NA 142  K 3.4*  CL 102  CO2 26  GLUCOSE 114*  BUN 56*  CREATININE 4.39*  CALCIUM 8.9  GFRNONAA 8*  GFRAA 10*  ANIONGAP 14    Lab Results  Component Value Date   ALT 15 12/20/2018   AST 17 12/20/2018   ALKPHOS 50 12/20/2018   BILITOT 0.7 12/20/2018   Hematology Recent Labs  Lab 09/08/19 0155  WBC 6.0  RBC 3.26*  HGB 10.2*  HCT 33.1*  MCV 101.5*  MCH 31.3  MCHC 30.8  RDW 14.1  PLT 175   Cardiac Enzymes High Sensitivity Troponin:   Recent Labs  Lab 09/08/19 0155 09/08/19 0344  TROPONINIHS 126* 105*      BNP Recent Labs  Lab 09/08/19 0437  BNP 1,601.8*    TSH:  Lab Results  Component Value Date   TSH 4.195 11/07/2015   Lipids:No results found for: CHOL, HDL, LDLCALC, LDLDIRECT, TRIG, CHOLHDL HgbA1c:No results found for: HGBA1C Magnesium:  Magnesium  Date Value Ref Range Status  12/21/2018 2.2 1.7 - 2.4 mg/dL Final    Comment:    Performed at [facility], [place] [address], [place], [place] [address]     Radiology/Studies:  DG Chest 2 View  Result Date: 09/08/2019 CLINICAL DATA:  Shortness of breath EXAM: CHEST - 2 VIEW COMPARISON:  Radiograph 11/07/2015 FINDINGS: Diffuse fine reticular opacities throughout the lungs with central vascular congestion,  fissural and septal thickening and a small left pleural effusion. More patchy opacity in the left lung base partially silhouettes the left heart border and diaphragm. Stable cardiomediastinal silhouette with a calcified aorta. The aorta is calcified. The  remaining cardiomediastinal contours are unremarkable. Calcification in the right axillary region could reflect a mineralized joint body. IMPRESSION: 1. Findings suggest pulmonary edema with small left effusion. 2. More patchy opacity in the left lung base could reflect alveolar edema versus infection. Electronically Signed   By: Lovena Le M.D.   On: 09/08/2019 02:06    Assessment and Plan:   1. Acute CHF: -She recently developed symptoms of CHF -Type of CHF is unclear, follow-up on echocardiogram results -She needs diuresis, discuss with MD if she should be on Lasix 40 mg IV twice daily - if renal function worsens, could consult nephrology to manage diuresis. - Once type of CHF clarified, can make additional medication recommendations.  2.  Hypertension: -Prior to admission, she was on diltiazem 300 mg daily, and Cardura 8 mg daily -Currently, blood pressure medications are on hold -Restart per MD  3.  CKD V -She had an AV fistula put in in 2018, it has never been used -Creatinine is generally in the high 3s, but she was in the 4s August 2020 when she had her gallbladder out  Otherwise, per IM Principal Problem:   New onset of congestive heart failure ([place]) Active Problems:   CKD (chronic kidney disease) stage 5, GFR less than 15 ml/min (HCC)   Macrocytic anemia   Elevated troponin   Hypokalemia   Hypertension   Hypothyroidism     For questions or updates, please contact [place] Please consult [email or web address] for contact info under Cardiology/STEMI.   Jonetta Speak, PA-C  09/08/2019 12:28 PM

## 2019-09-08 NOTE — Progress Notes (Signed)
Patient seen and examined personally, I reviewed the chart, history and physical and admission note, done by admitting physician this morning and agree with the same with following addendum.  Please refer to the morning admission note for more detailed plan of care.  Briefly,  86 y.o. female with medical history significant of anemia, stage V CKD, history of DVT, gallstones, hypertension, hypothyroid this then after having Graves' disease who is coming to the emergency department due to dyspnea and lower extremity edema since this morning.  However, the patient states that for the previous 2 days she was feeling very tired, but at that time she had not noticed any lower extremity edema.  She has had some orthopnea.  She also stated that she has noticed she has difficulty feeding her close today and thinks that she is retaining some fluid in her abdomen.  She denies chest pain, palpitations, dizziness, diaphoresis, PND.  Fever, chills, rhinorrhea, sore throat, productive cough, wheezing or hemoptysis.  No abdominal pain, nausea, emesis, recent diarrhea or constipation, melena or hematochezia.  No dysuria, frequency or hematuria.  No polyuria, polydipsia, polyphagia or blurred vision.  ED Course: Initial vital signs were temperature 97.6 F, pulse 74, respirations 16, blood pressure 164/60 mmHg and O2 sat 97% on room air.  The patient had an nitro paste patch placed in the ED.  I added furosemide 80 mg IVP x1.  She stated that she feels better.  White count 6.0, hemoglobin 10.2 g/dL and platelets 175.  Sodium 142, potassium 3.4, chloride 102 and CO2 26 mmol/L.  Glucose 114, BUN 56, creatinine 4.39 and calcium 8.9 mg/dL.  Chest radiograph shows pulmonary edema with small left effusion.  More patchy opacity in the left lung base could reflect alveolar edema versus infection.  subjective Seen this am in ED, she reports she had trouble breathing when she went to bed Tuesday and woke on bed. Was tired on  Sunday and Monday and did not sleep well. Had swollen ankle on Wednesday Lives with son at home who is there to help her 24x7 at home.  On exam,crackles b/l basal lungs Not on home o2 currently on 1.5l [place] saturating at 100%. Not on lasix at home. Feels overall better. Ankle edema + but better.  Issues being addressed  Shortness of breath/leg edema:Suspecting 2/2 new onset CHF, acute diastolic and also contributed by CKD stage V: Present leg edema, chest x-ray suggestive of pulmonary edema with a small left pleural effusion. Symptoms seems to be improving, echocardiogram pending, cardio consulted.  Continue on IV Lasix and monitor her renal function.  Mildly positive troponin likely demand mismatch, cardiology consulted no plan for ischemic evaluation at this time.  CKD stage V not on dialysis followed by Dr. Hollie Salk, baseline creatinine 3.6-3.7 , bun 30s in August/2020 . creatinine slightly uptrending.  Monitor closely if continues to rise or uremic may need to discussion nephrology for dialysis. Recent Labs  Lab 09/08/19 0155  BUN 56*  CREATININE 4.39*   Hypertension: Pressure borderline controlled.  Resume Cardizem in a.m., continue doxazosin.  Macrocytic anemia: Hemoglobin overall stable, O27 741 and folic acid 68- WNL. Monitor.  Hypothyroidism: Continue home Synthroid  Status is: admitted under Observation  Patient continues to remain hospitalized for ongoing management of CHF with cardiology evaluation and close monitoring of renal function in the setting of CKD stage V while on iv diuresis.  Dispo: The patient is from: Home  Anticipated d/c is to: Home              Anticipated d/c date is: 1-2 days              Patient currently is not medically stable to d/c.

## 2019-09-09 ENCOUNTER — Other Ambulatory Visit: Payer: Medicare Other

## 2019-09-09 DIAGNOSIS — Z86718 Personal history of other venous thrombosis and embolism: Secondary | ICD-10-CM | POA: Diagnosis not present

## 2019-09-09 DIAGNOSIS — I509 Heart failure, unspecified: Secondary | ICD-10-CM | POA: Diagnosis not present

## 2019-09-09 DIAGNOSIS — Z79890 Hormone replacement therapy (postmenopausal): Secondary | ICD-10-CM | POA: Diagnosis not present

## 2019-09-09 DIAGNOSIS — Z91013 Allergy to seafood: Secondary | ICD-10-CM | POA: Diagnosis not present

## 2019-09-09 DIAGNOSIS — Z880 Allergy status to penicillin: Secondary | ICD-10-CM | POA: Diagnosis not present

## 2019-09-09 DIAGNOSIS — N179 Acute kidney failure, unspecified: Secondary | ICD-10-CM | POA: Diagnosis not present

## 2019-09-09 DIAGNOSIS — Z79891 Long term (current) use of opiate analgesic: Secondary | ICD-10-CM | POA: Diagnosis not present

## 2019-09-09 DIAGNOSIS — R0601 Orthopnea: Secondary | ICD-10-CM | POA: Diagnosis present

## 2019-09-09 DIAGNOSIS — N185 Chronic kidney disease, stage 5: Secondary | ICD-10-CM | POA: Diagnosis present

## 2019-09-09 DIAGNOSIS — Z20822 Contact with and (suspected) exposure to covid-19: Secondary | ICD-10-CM | POA: Diagnosis present

## 2019-09-09 DIAGNOSIS — Z881 Allergy status to other antibiotic agents status: Secondary | ICD-10-CM | POA: Diagnosis not present

## 2019-09-09 DIAGNOSIS — Z8249 Family history of ischemic heart disease and other diseases of the circulatory system: Secondary | ICD-10-CM | POA: Diagnosis not present

## 2019-09-09 DIAGNOSIS — I151 Hypertension secondary to other renal disorders: Secondary | ICD-10-CM | POA: Diagnosis not present

## 2019-09-09 DIAGNOSIS — I5033 Acute on chronic diastolic (congestive) heart failure: Secondary | ICD-10-CM | POA: Diagnosis present

## 2019-09-09 DIAGNOSIS — R633 Feeding difficulties: Secondary | ICD-10-CM | POA: Diagnosis present

## 2019-09-09 DIAGNOSIS — N2889 Other specified disorders of kidney and ureter: Secondary | ICD-10-CM | POA: Diagnosis not present

## 2019-09-09 DIAGNOSIS — E039 Hypothyroidism, unspecified: Secondary | ICD-10-CM | POA: Diagnosis present

## 2019-09-09 DIAGNOSIS — Z9049 Acquired absence of other specified parts of digestive tract: Secondary | ICD-10-CM | POA: Diagnosis not present

## 2019-09-09 DIAGNOSIS — I447 Left bundle-branch block, unspecified: Secondary | ICD-10-CM | POA: Diagnosis present

## 2019-09-09 DIAGNOSIS — J9621 Acute and chronic respiratory failure with hypoxia: Secondary | ICD-10-CM | POA: Diagnosis present

## 2019-09-09 DIAGNOSIS — R0602 Shortness of breath: Secondary | ICD-10-CM | POA: Diagnosis not present

## 2019-09-09 DIAGNOSIS — E0500 Thyrotoxicosis with diffuse goiter without thyrotoxic crisis or storm: Secondary | ICD-10-CM | POA: Diagnosis not present

## 2019-09-09 DIAGNOSIS — D631 Anemia in chronic kidney disease: Secondary | ICD-10-CM | POA: Diagnosis present

## 2019-09-09 DIAGNOSIS — Z882 Allergy status to sulfonamides status: Secondary | ICD-10-CM | POA: Diagnosis not present

## 2019-09-09 DIAGNOSIS — D539 Nutritional anemia, unspecified: Secondary | ICD-10-CM | POA: Diagnosis present

## 2019-09-09 DIAGNOSIS — R0609 Other forms of dyspnea: Secondary | ICD-10-CM | POA: Diagnosis present

## 2019-09-09 DIAGNOSIS — E876 Hypokalemia: Secondary | ICD-10-CM | POA: Diagnosis present

## 2019-09-09 DIAGNOSIS — I132 Hypertensive heart and chronic kidney disease with heart failure and with stage 5 chronic kidney disease, or end stage renal disease: Secondary | ICD-10-CM | POA: Diagnosis present

## 2019-09-09 DIAGNOSIS — R778 Other specified abnormalities of plasma proteins: Secondary | ICD-10-CM | POA: Diagnosis not present

## 2019-09-09 DIAGNOSIS — Z79899 Other long term (current) drug therapy: Secondary | ICD-10-CM | POA: Diagnosis not present

## 2019-09-09 LAB — CBC
HCT: 30.1 % — ABNORMAL LOW (ref 36.0–46.0)
Hemoglobin: 9.3 g/dL — ABNORMAL LOW (ref 12.0–15.0)
MCH: 31.2 pg (ref 26.0–34.0)
MCHC: 30.9 g/dL (ref 30.0–36.0)
MCV: 101 fL — ABNORMAL HIGH (ref 80.0–100.0)
Platelets: 173 10*3/uL (ref 150–400)
RBC: 2.98 MIL/uL — ABNORMAL LOW (ref 3.87–5.11)
RDW: 14.2 % (ref 11.5–15.5)
WBC: 6.5 10*3/uL (ref 4.0–10.5)
nRBC: 0 % (ref 0.0–0.2)

## 2019-09-09 LAB — BASIC METABOLIC PANEL
Anion gap: 8 (ref 5–15)
BUN: 58 mg/dL — ABNORMAL HIGH (ref 8–23)
CO2: 29 mmol/L (ref 22–32)
Calcium: 8.4 mg/dL — ABNORMAL LOW (ref 8.9–10.3)
Chloride: 105 mmol/L (ref 98–111)
Creatinine, Ser: 4.51 mg/dL — ABNORMAL HIGH (ref 0.44–1.00)
GFR calc Af Amer: 9 mL/min — ABNORMAL LOW (ref >60)
GFR calc non Af Amer: 8 mL/min — ABNORMAL LOW (ref >60)
Glucose, Bld: 83 mg/dL (ref 70–99)
Potassium: 3.7 mmol/L (ref 3.5–5.1)
Sodium: 142 mmol/L (ref 135–145)

## 2019-09-09 NOTE — Plan of Care (Signed)
  Problem: Education: Goal: Knowledge of General Education information will improve Description: Including pain rating scale, medication(s)/side effects and non-pharmacologic comfort measures Outcome: Progressing   Problem: Health Behavior/Discharge Planning: Goal: Ability to manage health-related needs will improve Outcome: Progressing   Problem: Health Behavior/Discharge Planning: Goal: Ability to manage health-related needs will improve Outcome: Progressing   

## 2019-09-09 NOTE — Progress Notes (Signed)
Progress Note  Patient Name: Yvonne Powell Date of Encounter: 09/09/2019  Primary Cardiologist: Skeet Latch, MD   Subjective   Feeling much better this morning.  Her breathing has improved significantly.  Inpatient Medications    Scheduled Meds: . calcitRIOL  0.25 mcg Oral Once per day on Mon Fri  . cholecalciferol  2,000 Units Oral Daily  . diltiazem  300 mg Oral Daily  . doxazosin  8 mg Oral Daily  . enoxaparin (LOVENOX) injection  30 mg Subcutaneous Daily  . furosemide  40 mg Intravenous BID  . levothyroxine  88 mcg Oral QAC breakfast  . multivitamin  1 tablet Oral Daily  . sodium chloride flush  3 mL Intravenous Once   Continuous Infusions:  PRN Meds: acetaminophen **OR** acetaminophen, docusate sodium, ondansetron **OR** ondansetron (ZOFRAN) IV, polyvinyl alcohol, triamcinolone   Vital Signs    Vitals:   09/09/19 0126 09/09/19 0215 09/09/19 0411 09/09/19 0852  BP: (!) 170/75 (!) 168/73 (!) 158/80 (!) 130/98  Pulse: 84  73 89  Resp: 18  18 20   Temp: 97.8 F (36.6 C)  (!) 97.5 F (36.4 C) 97.7 F (36.5 C)  TempSrc: Oral  Oral Oral  SpO2: 92%  96% 98%  Weight:   53 kg   Height:        Intake/Output Summary (Last 24 hours) at 09/09/2019 1132 Last data filed at 09/09/2019 1108 Gross per 24 hour  Intake 360 ml  Output 1200 ml  Net -840 ml   Last 3 Weights 09/09/2019 09/08/2019 09/08/2019  Weight (lbs) 116 lb 14.4 oz 118 lb 14.4 oz 119 lb  Weight (kg) 53.025 kg 53.933 kg 53.978 kg      Telemetry    Sinus rhythm.  PVCs.  17 beats of NSVT.- Personally Reviewed  ECG    09/08/19: :Sinus rhyhtm.  Rate 86 bpm.  LBBB - Personally Reviewed  Physical Exam   VS:  BP (!) 130/98 (BP Location: Left Arm)   Pulse 89   Temp 97.7 F (36.5 C) (Oral)   Resp 20   Ht 5\' 3"  (1.6 m)   Wt 53 kg   SpO2 98%   BMI 20.71 kg/m  , BMI Body mass index is 20.71 kg/m. GENERAL:  Well appearing HEENT: Pupils equal round and reactive, fundi not visualized, oral mucosa  unremarkable NECK:  No jugular venous distention, waveform within normal limits, carotid upstroke brisk and symmetric, no bruits LUNGS:  Diminished breath sounds.  Mild crackles HEART:  RRR.  PMI not displaced or sustained,S1 and S2 within normal limits, no S3, no S4, no clicks, no rubs, no murmurs ABD:  Flat, positive bowel sounds normal in frequency in pitch, no bruits, no rebound, no guarding, no midline pulsatile mass, no hepatomegaly, no splenomegaly EXT:  2 plus pulses throughout, no edema, no cyanosis no clubbing SKIN:  No rashes no nodules NEURO:  Cranial nerves II through XII grossly intact, motor grossly intact throughout PSYCH:  Cognitively intact, oriented to person place and time   Labs    High Sensitivity Troponin:   Recent Labs  Lab 09/08/19 0155 09/08/19 0344  TROPONINIHS 126* 105*      Chemistry Recent Labs  Lab 09/08/19 0155 09/09/19 0613  NA 142 142  K 3.4* 3.7  CL 102 105  CO2 26 29  GLUCOSE 114* 83  BUN 56* 58*  CREATININE 4.39* 4.51*  CALCIUM 8.9 8.4*  GFRNONAA 8* 8*  GFRAA 10* 9*  ANIONGAP 14 8  Hematology Recent Labs  Lab 09/08/19 0155 09/09/19 0613  WBC 6.0 6.5  RBC 3.26* 2.98*  HGB 10.2* 9.3*  HCT 33.1* 30.1*  MCV 101.5* 101.0*  MCH 31.3 31.2  MCHC 30.8 30.9  RDW 14.1 14.2  PLT 175 173    BNP Recent Labs  Lab 09/08/19 0437  BNP 1,601.8*     DDimer No results for input(s): DDIMER in the last 168 hours.   Radiology    DG Chest 2 View  Result Date: 09/08/2019 CLINICAL DATA:  Shortness of breath EXAM: CHEST - 2 VIEW COMPARISON:  Radiograph 11/07/2015 FINDINGS: Diffuse fine reticular opacities throughout the lungs with central vascular congestion, fissural and septal thickening and a small left pleural effusion. More patchy opacity in the left lung base partially silhouettes the left heart border and diaphragm. Stable cardiomediastinal silhouette with a calcified aorta. The aorta is calcified. The remaining cardiomediastinal  contours are unremarkable. Calcification in the right axillary region could reflect a mineralized joint body. IMPRESSION: 1. Findings suggest pulmonary edema with small left effusion. 2. More patchy opacity in the left lung base could reflect alveolar edema versus infection. Electronically Signed   By: Lovena Le M.D.   On: 09/08/2019 02:06   ECHOCARDIOGRAM COMPLETE  Result Date: 09/08/2019    ECHOCARDIOGRAM REPORT   Patient Name:   Yvonne Powell Date of Exam: 09/08/2019 Medical Rec #:  [id number]      Height:       66.0 in Accession #:    [id number]     Weight:       119.0 lb Date of Birth:  Jun 23, 1924       BSA:          1.604 m Patient Age:    86 years       BP:           162/73 mmHg Patient Gender: F              HR:           71 bpm. Exam Location:  Inpatient Procedure: 2D Echo Indications:    CHF 428  History:        Patient has no prior history of Echocardiogram examinations.                 Risk Factors:Hypertension.  Sonographer:    Jannett Celestine RDCS (AE) Referring Phys: [id number] DAVID MANUEL [place]  1. Left ventricular ejection fraction, by estimation, is 50 to 55%. The left ventricle has low normal function. Septal-lateral dyssynchrony consistent with LBBB. There is moderate left ventricular hypertrophy. Left ventricular diastolic parameters are consistent with Grade I diastolic dysfunction (impaired relaxation).  2. Right ventricular systolic function is normal. The right ventricular size is normal. Tricuspid regurgitation signal is inadequate for assessing PA pressure.  3. Left atrial size was moderately dilated.  4. The mitral valve is degenerative. Mild mitral valve regurgitation. No evidence of mitral stenosis.  5. The aortic valve is tricuspid. Aortic valve regurgitation is not visualized. Mild aortic valve sclerosis is present, with no evidence of aortic valve stenosis.  6. The inferior vena cava is dilated in size with <50% respiratory variability, suggesting right atrial pressure of  15 mmHg.  7. Small circumferential pericardial effusion, no evidence for tamponade. I do not think that the dilated IVC is related to the pericardial effusion. FINDINGS  Left Ventricle: Left ventricular ejection fraction, by estimation, is 50 to 55%. The left ventricle has low normal function. The left ventricle  demonstrates regional wall motion abnormalities. The left ventricular internal cavity size was normal in size. There is moderate left ventricular hypertrophy. Left ventricular diastolic parameters are consistent with Grade I diastolic dysfunction (impaired relaxation). Right Ventricle: The right ventricular size is normal. No increase in right ventricular wall thickness. Right ventricular systolic function is normal. Tricuspid regurgitation signal is inadequate for assessing PA pressure. Left Atrium: Left atrial size was moderately dilated. Right Atrium: Right atrial size was normal in size. Pericardium: A small pericardial effusion is present. Mitral Valve: The mitral valve is degenerative in appearance. There is mild calcification of the mitral valve leaflet(s). Moderate mitral annular calcification. Mild mitral valve regurgitation. No evidence of mitral valve stenosis. MV peak gradient, 13.1  mmHg. The mean mitral valve gradient is 8.0 mmHg. Tricuspid Valve: The tricuspid valve is normal in structure. Tricuspid valve regurgitation is trivial. Aortic Valve: The aortic valve is tricuspid. Aortic valve regurgitation is not visualized. Mild aortic valve sclerosis is present, with no evidence of aortic valve stenosis. Pulmonic Valve: The pulmonic valve was normal in structure. Pulmonic valve regurgitation is not visualized. Aorta: The aortic root is normal in size and structure. Venous: The inferior vena cava is dilated in size with less than 50% respiratory variability, suggesting right atrial pressure of 15 mmHg. IAS/Shunts: No atrial level shunt detected by color flow Doppler.  LEFT VENTRICLE PLAX 2D LVIDd:          5.00 cm  Diastology LVIDs:         3.30 cm  LV e' lateral:   6.85 cm/s LV PW:         1.10 cm  LV E/e' lateral: 23.4 LV IVS:        1.50 cm LVOT diam:     1.90 cm LV SV:         112 LV SV Index:   70 LVOT Area:     2.84 cm  RIGHT VENTRICLE TAPSE (M-mode): 2.5 cm LEFT ATRIUM             Index       RIGHT ATRIUM           Index LA diam:        4.40 cm 2.74 cm/m  RA Area:     17.50 cm LA Vol (A2C):   48.3 ml 30.12 ml/m RA Volume:   48.90 ml  30.49 ml/m LA Vol (A4C):   91.1 ml 56.77 ml/m LA Biplane Vol: 53.7 ml 33.48 ml/m  AORTIC VALVE LVOT Vmax:   147.00 cm/s LVOT Vmean:  110.000 cm/s LVOT VTI:    0.394 m  AORTA Ao Root diam: 3.10 cm MITRAL VALVE MV Area (PHT): 5.13 cm     SHUNTS MV Peak grad:  13.1 mmHg    Systemic VTI:  0.39 m MV Mean grad:  8.0 mmHg     Systemic Diam: 1.90 cm MV Vmax:       1.81 m/s MV Vmean:      134.0 cm/s MV Decel Time: 148 msec MR Peak grad: 127.7 mmHg MR Mean grad: 79.0 mmHg MR Vmax:      565.00 cm/s MR Vmean:     423.0 cm/s MV E velocity: 160.00 cm/s MV A velocity: 162.00 cm/s MV E/A ratio:  0.99 Loralie Champagne MD Electronically signed by Loralie Champagne MD Signature Date/Time: 09/08/2019/5:55:06 PM    Final     Cardiac Studies   Echo 09/08/19: 1. Left ventricular ejection fraction, by estimation, is 50 to 55%. The  left ventricle has low normal function. Septal-lateral dyssynchrony  consistent with LBBB. There is moderate left ventricular hypertrophy. Left ventricular diastolic parameters are consistent with Grade I diastolic dysfunction (impaired relaxation).  2. Right ventricular systolic function is normal. The right ventricular  size is normal. Tricuspid regurgitation signal is inadequate for assessing PA pressure.  3. Left atrial size was moderately dilated.  4. The mitral valve is degenerative. Mild mitral valve regurgitation. No  evidence of mitral stenosis.  5. The aortic valve is tricuspid. Aortic valve regurgitation is not  visualized. Mild aortic valve  sclerosis is present, with no evidence of  aortic valve stenosis.  6. The inferior vena cava is dilated in size with <50% respiratory variability, suggesting right atrial pressure of 15 mmHg.  7. Small circumferential pericardial effusion, no evidence for tamponade. I do not think that the dilated IVC is related to the pericardial effusion.   Patient Profile     Ms. Moss is a 86 year old woman with hypertension, CKD 5, hypothyroidism, prior DVT, and anemia chronic disease admitted with acute onset of shortness of breath and lower extremity edema concerning for heart failure.  She has chronic kidney disease and had a fistula placed 04/2017.    Assessment & Plan    # Acute on chronic diastolic heart failure: # Acute hypoxic respiratory failure: # Acute on chronic renal failure:   Her hypoxic respiratory failure is likely multifactorial in the setting of CKD 5 not on dialysis and diastolic heart failure.  Echo this admission revealed chronic left bundle branch block with some septal lateral dyssynchrony but otherwise preserved systolic function.  I suspect that her renal dysfunction is a larger contributor.  Her creatinine is slightly worse than her baseline at this time.  It is maintaining with diuresis and she is feeling much better.  We will continue with 1 more day of IV diuresis with likely switching to oral tomorrow.  # Essential hypertension:  Blood pressure stable on her home regimen of diltiazem and doxazosin.  # Demand ischemia: Troponin minimally elevated to 126 and now downtrending.  She has CKD 5 and has no chest pain.  No plan for ischemia evaluation at this time, especially given her normal systolic function.  # Abnormal U/A: Per IM.  Patient reports sensitivities to multiple antibiotics      For questions or updates, please contact [place] Please consult [email or web address] for contact info under        Signed, Skeet Latch, MD  09/09/2019, 11:32 AM

## 2019-09-09 NOTE — Progress Notes (Signed)
APP notified of elevated BP (168/73) with no prns ordered.

## 2019-09-09 NOTE — TOC Progression Note (Addendum)
Transition of Care [facility]) - Progression Note    Patient Details  Name: Kawana Hegel MRN: [id number] Date of Birth: 1928-07-25  [facility]) CM/SW Contact  Zenon Mayo, RN Phone Number: 09/09/2019, 4:18 PM  Clinical Narrative:    NCM spoke with patient at bedside with daughrter, she lives with son, she gets around good.  NCM offered choice for [facility] for disease CHF management,she chose [facility], referral made to [facility] with [facility], he is able to take referral for [facility].  Soc will begin 24 to 48 hrs post dc.   Will need HHRN order prior to dc.    Expected Discharge Plan: [place] Barriers to Discharge: Continued Medical Work up  Expected Discharge Plan and Services Expected Discharge Plan: [place]   Discharge Planning Services: CM Consult Post Acute Care Choice: [place] arrangements for the past 2 months: Single Family Home                           HH Arranged: RN, Disease Management [place] Agency: [place] Date [place]: 09/09/19 Time [place]: 1617 Representative spoke with at [place]: [place] ([place]) Interventions    Readmission Risk Interventions No flowsheet data found.

## 2019-09-09 NOTE — Progress Notes (Signed)
PROGRESS NOTE    Yvonne Powell  KDT:[id number] DOB: 09/25/1928 DOA: 09/08/2019 PCP: Leeroy Cha, MD   Brief Narrative:  86 y.o.femalewith medical history significant ofanemia, stage V CKD, history of DVT, gallstones, hypertension, hypothyroid this then after having Graves' disease who is coming tothe emergency department due to dyspnea and lower extremity edema since this morning. However, the patient states that for the previous 2 days she was feeling very tired, but at that time she had not noticed any lower extremity edema. She has had some orthopnea. She also stated that she has noticed she has difficulty feeding her close today and thinks that she is retaining some fluid in her abdomen. She denies chest pain, palpitations, dizziness, diaphoresis, PND. Fever, chills, rhinorrhea, sore throat, productive cough, wheezing or hemoptysis. No abdominal pain, nausea, emesis, recent diarrhea or constipation, melena or hematochezia. No dysuria, frequency or hematuria. No polyuria, polydipsia, polyphagia or blurred vision.  ED Course:Initial vital signs were temperature 97.6 F, pulse 74, respirations 16, blood pressure 164/60 mmHg and O2 sat 97% on room air.The patient had an nitro paste patch placed in the ED. I added furosemide 80 mg IVP x1. She stated that she feels better. White count 6.0, hemoglobin 10.2 g/dL and platelets 175. Sodium 142, potassium 3.4, chloride 102 and CO2 26 mmol/L. Glucose 114, BUN 56, creatinine 4.39 and calcium 8.9 mg/dL. Chest radiograph shows pulmonary edema with small left effusion. More patchy opacity in the left lung base could reflect alveolar edema versus infection.  She reported she woke up on bed Wednesday short of breath. Was tired on Sunday and Monday and did not sleep well.Had swollen ankle on Wednesday. Lives with son at home who is there to help her 24x7 at home. Pateint was admitted and being diuresed. With IV diuresis feeling  significantly better.  Subjective:  Feels better, shortness of breath improving.able to take deep breath unlike at home where she could not Wt 119->116 lb Voiding well. Labs pending Placed on [facility] o2- to wean off.   Assessment & Plan:  Shortness of breath/leg edema:Suspecting 2/2 new onset CHF, acute diastolic and also contributed by CKD stage V: Presented with leg edema, chest x-ray suggestive of pulmonary edema with a small left pleural effusion. Symptoms seems to be improving, echocardiogram shows EF 50 to 55% septal lateral dyssynchrony RV systolic function normal grade 1 diastolic dysfunction.  Appreciate cardiology input continue on IV diuresis for 1 more day as she is significantly improving.  Expect creatinine to run slightly high.  Acute hypoxic respiratory failure due to CHF.  Continue diuresis as above and wean oxygen as tolerated.  Mildly positive troponin likely demand mismatch, cardiology consulted no plan for ischemic evaluation at  this time.  CKD stage V not on dialysis followed by Dr. Hollie Salk, baseline creatinine 3.6-3.7 , bun 30s in August/2020 . creatinine slightly uptrending.  Monitor closely if continues to rise or uremic may need to discussion nephrology for dialysis.last seen in April and placed on lokelma for high K and now on every other day. Recent Labs  Lab 09/08/19 0155 09/09/19 0613  BUN 56* 58*  CREATININE 4.39* 4.51*   Hypokalemia replaced  Hypertension: BP controlled. cont her Cardizem in a.m., continue doxazosin. Macrocytic anemia: Hemoglobin overall stable, D98 338 and folic acid 68- WNL. Monitor. Hypothyroidism: Continue home Synthroid  DVT prophylaxis:lovenox Code Status: FULL Family Communication: plan of care discussed with patient at bedside.  Status is: admitted as Observation The patient will require care spanning > 2 midnights  and should be moved to inpatient because: Inpatient level of care appropriate due to severity of illness and  Ongoing need for IV diuresis for CHF with close monitoring of renal function in the setting of CKD stage V  Dispo: The patient is from: Home              Anticipated d/c is to: Home              Anticipated d/c date is: 1 day              Patient currently is not medically stable to d/c. Nutrition: Diet Order            Diet renal with fluid restriction Fluid restriction: 1200 mL Fluid; Room service appropriate? Yes; Fluid consistency: Thin  Diet effective now             Body mass index is 20.71 kg/m. Consultants: cardiology Procedures:see note Microbiology:see note  Medications: Scheduled Meds: . calcitRIOL  0.25 mcg Oral Once per day on Mon Fri  . cholecalciferol  2,000 Units Oral Daily  . diltiazem  300 mg Oral Daily  . doxazosin  8 mg Oral Daily  . enoxaparin (LOVENOX) injection  30 mg Subcutaneous Daily  . furosemide  40 mg Intravenous BID  . levothyroxine  88 mcg Oral QAC breakfast  . multivitamin  1 tablet Oral Daily  . sodium chloride flush  3 mL Intravenous Once  . sodium zirconium cyclosilicate  10 g Oral QODAY   Continuous Infusions:  Antimicrobials: Anti-infectives (From admission, onward)   None       Objective: Vitals: Today's Vitals   09/08/19 2356 09/09/19 0126 09/09/19 0215 09/09/19 0411  BP:  (!) 170/75 (!) 168/73 (!) 158/80  Pulse:  84  73  Resp:  18  18  Temp:  97.8 F (36.6 C)  (!) 97.5 F (36.4 C)  TempSrc:  Oral  Oral  SpO2:  92%  96%  Weight:    53 kg  Height:      PainSc: Asleep  0-No pain     Intake/Output Summary (Last 24 hours) at 09/09/2019 0827 Last data filed at 09/09/2019 0224 Gross per 24 hour  Intake 120 ml  Output 901 ml  Net -781 ml   Filed Weights   09/08/19 0142 09/08/19 1700 09/09/19 0411  Weight: 54 kg 53.9 kg 53 kg   Weight change: -0.045 kg   Intake/Output from previous day: 05/06 0701 - 05/07 0700 In: 120 [P.O.:120] Out: 901 [Urine:900; Stool:1] Intake/Output this shift: No intake/output data  recorded.  Examination:  General exam: AAOx3,NAD, weak appearing. HEENT:Oral mucosa moist, Ear/Nose WNL grossly,dentition normal. Respiratory system: bilaterally mild crackles,no wheezing or crackles,no use of accessory muscle, non tender. Cardiovascular system: S1 & S2 +, regular, No JVD. Gastrointestinal system: Abdomen soft, NT,ND, BS+. Nervous System:Alert, awake, moving extremities and grossly nonfocal Extremities:  B/l leg edema + and improving, distal peripheral pulses palpable.  Skin: No rashes,no icterus. MSK: Normal muscle bulk,tone, power  Data Reviewed: I have personally reviewed following labs and imaging studies CBC: Recent Labs  Lab 09/08/19 0155  WBC 6.0  HGB 10.2*  HCT 33.1*  MCV 101.5*  PLT 916   Basic Metabolic Panel: Recent Labs  Lab 09/08/19 0155  NA 142  K 3.4*  CL 102  CO2 26  GLUCOSE 114*  BUN 56*  CREATININE 4.39*  CALCIUM 8.9   GFR: Estimated Creatinine Clearance: 7.2 mL/min (A) (by C-G formula based on SCr  of 4.39 mg/dL (H)). Liver Function Tests: No results for input(s): AST, ALT, ALKPHOS, BILITOT, PROT, ALBUMIN in the last 168 hours. No results for input(s): LIPASE, AMYLASE in the last 168 hours. No results for input(s): AMMONIA in the last 168 hours. Coagulation Profile: No results for input(s): INR, PROTIME in the last 168 hours. Cardiac Enzymes: No results for input(s): CKTOTAL, CKMB, CKMBINDEX, TROPONINI in the last 168 hours. BNP (last 3 results) No results for input(s): PROBNP in the last 8760 hours. HbA1C: No results for input(s): HGBA1C in the last 72 hours. CBG: No results for input(s): GLUCAP in the last 168 hours. Lipid Profile: No results for input(s): CHOL, HDL, LDLCALC, TRIG, CHOLHDL, LDLDIRECT in the last 72 hours. Thyroid Function Tests: No results for input(s): TSH, T4TOTAL, FREET4, T3FREE, THYROIDAB in the last 72 hours. Anemia Panel: Recent Labs    09/08/19 0155  [id number]  FOLATE 68.1   Sepsis  Labs: No results for input(s): PROCALCITON, LATICACIDVEN in the last 168 hours.  Recent Results (from the past 240 hour(s))  Respiratory Panel by RT PCR (Flu A&B, Covid) - Nasopharyngeal Swab     Status: None   Collection Time: 09/08/19  5:55 AM   Specimen: Nasopharyngeal Swab  Result Value Ref Range Status   SARS Coronavirus 2 by RT PCR NEGATIVE NEGATIVE Final    Comment: (NOTE) SARS-CoV-2 target nucleic acids are NOT DETECTED. The SARS-CoV-2 RNA is generally detectable in upper respiratoy specimens during the acute phase of infection. The lowest concentration of SARS-CoV-2 viral copies this assay can detect is 131 copies/mL. A negative result does not preclude SARS-Cov-2 infection and should not be used as the sole basis for treatment or other patient management decisions. A negative result may occur with  improper specimen collection/handling, submission of specimen other than nasopharyngeal swab, presence of viral mutation(s) within the areas targeted by this assay, and inadequate number of viral copies (<131 copies/mL). A negative result must be combined with clinical observations, patient history, and epidemiological information. The expected result is Negative. Fact Sheet for Patients:  PinkCheek.be Fact Sheet for Healthcare Providers:  GravelBags.it This test is not yet ap proved or cleared by the [place] FDA and  has been authorized for detection and/or diagnosis of SARS-CoV-2 by FDA under an Emergency Use Authorization (EUA). This EUA will remain  in effect (meaning this test can be used) for the duration of the COVID-19 declaration under Section 564(b)(1) of the Act, 21 U.S.C. section 360bbb-3(b)(1), unless the authorization is terminated or revoked sooner.    Influenza A by PCR NEGATIVE NEGATIVE Final   Influenza B by PCR NEGATIVE NEGATIVE Final    Comment: (NOTE) The Xpert Xpress SARS-CoV-2/FLU/RSV assay  is intended as an aid in  the diagnosis of influenza from Nasopharyngeal swab specimens and  should not be used as a sole basis for treatment. Nasal washings and  aspirates are unacceptable for Xpert Xpress SARS-CoV-2/FLU/RSV  testing. Fact Sheet for Patients: PinkCheek.be Fact Sheet for Healthcare Providers: GravelBags.it This test is not yet approved or cleared by the [place] FDA and  has been authorized for detection and/or diagnosis of SARS-CoV-2 by  FDA under an Emergency Use Authorization (EUA). This EUA will remain  in effect (meaning this test can be used) for the duration of the  Covid-19 declaration under Section 564(b)(1) of the Act, 21  U.S.C. section 360bbb-3(b)(1), unless the authorization is  terminated or revoked. Performed at [facility], [place] [address] [place],  [address]  Radiology Studies: DG Chest 2 View  Result Date: 09/08/2019 CLINICAL DATA:  Shortness of breath EXAM: CHEST - 2 VIEW COMPARISON:  Radiograph 11/07/2015 FINDINGS: Diffuse fine reticular opacities throughout the lungs with central vascular congestion, fissural and septal thickening and a small left pleural effusion. More patchy opacity in the left lung base partially silhouettes the left heart border and diaphragm. Stable cardiomediastinal silhouette with a calcified aorta. The aorta is calcified. The remaining cardiomediastinal contours are unremarkable. Calcification in the right axillary region could reflect a mineralized joint body. IMPRESSION: 1. Findings suggest pulmonary edema with small left effusion. 2. More patchy opacity in the left lung base could reflect alveolar edema versus infection. Electronically Signed   By: Lovena Le M.D.   On: 09/08/2019 02:06   ECHOCARDIOGRAM COMPLETE  Result Date: 09/08/2019    ECHOCARDIOGRAM REPORT   Patient Name:   Yvonne Powell Date of Exam: 09/08/2019 Medical Rec #:  [id number]       Height:       66.0 in Accession #:    [id number]     Weight:       119.0 lb Date of Birth:  Jul 25, 1928       BSA:          1.604 m Patient Age:    65 years       BP:           162/73 mmHg Patient Gender: F              HR:           71 bpm. Exam Location:  Inpatient Procedure: 2D Echo Indications:    CHF 428  History:        Patient has no prior history of Echocardiogram examinations.                 Risk Factors:Hypertension.  Sonographer:    Jannett Celestine RDCS (AE) Referring Phys: [id number] DAVID MANUEL [place]  1. Left ventricular ejection fraction, by estimation, is 50 to 55%. The left ventricle has low normal function. Septal-lateral dyssynchrony consistent with LBBB. There is moderate left ventricular hypertrophy. Left ventricular diastolic parameters are consistent with Grade I diastolic dysfunction (impaired relaxation).  2. Right ventricular systolic function is normal. The right ventricular size is normal. Tricuspid regurgitation signal is inadequate for assessing PA pressure.  3. Left atrial size was moderately dilated.  4. The mitral valve is degenerative. Mild mitral valve regurgitation. No evidence of mitral stenosis.  5. The aortic valve is tricuspid. Aortic valve regurgitation is not visualized. Mild aortic valve sclerosis is present, with no evidence of aortic valve stenosis.  6. The inferior vena cava is dilated in size with <50% respiratory variability, suggesting right atrial pressure of 15 mmHg.  7. Small circumferential pericardial effusion, no evidence for tamponade. I do not think that the dilated IVC is related to the pericardial effusion. FINDINGS  Left Ventricle: Left ventricular ejection fraction, by estimation, is 50 to 55%. The left ventricle has low normal function. The left ventricle demonstrates regional wall motion abnormalities. The left ventricular internal cavity size was normal in size. There is moderate left ventricular hypertrophy. Left ventricular diastolic  parameters are consistent with Grade I diastolic dysfunction (impaired relaxation). Right Ventricle: The right ventricular size is normal. No increase in right ventricular wall thickness. Right ventricular systolic function is normal. Tricuspid regurgitation signal is inadequate for assessing PA pressure. Left Atrium: Left atrial size was moderately dilated. Right Atrium: Right atrial size  was normal in size. Pericardium: A small pericardial effusion is present. Mitral Valve: The mitral valve is degenerative in appearance. There is mild calcification of the mitral valve leaflet(s). Moderate mitral annular calcification. Mild mitral valve regurgitation. No evidence of mitral valve stenosis. MV peak gradient, 13.1  mmHg. The mean mitral valve gradient is 8.0 mmHg. Tricuspid Valve: The tricuspid valve is normal in structure. Tricuspid valve regurgitation is trivial. Aortic Valve: The aortic valve is tricuspid. Aortic valve regurgitation is not visualized. Mild aortic valve sclerosis is present, with no evidence of aortic valve stenosis. Pulmonic Valve: The pulmonic valve was normal in structure. Pulmonic valve regurgitation is not visualized. Aorta: The aortic root is normal in size and structure. Venous: The inferior vena cava is dilated in size with less than 50% respiratory variability, suggesting right atrial pressure of 15 mmHg. IAS/Shunts: No atrial level shunt detected by color flow Doppler.  LEFT VENTRICLE PLAX 2D LVIDd:         5.00 cm  Diastology LVIDs:         3.30 cm  LV e' lateral:   6.85 cm/s LV PW:         1.10 cm  LV E/e' lateral: 23.4 LV IVS:        1.50 cm LVOT diam:     1.90 cm LV SV:         112 LV SV Index:   70 LVOT Area:     2.84 cm  RIGHT VENTRICLE TAPSE (M-mode): 2.5 cm LEFT ATRIUM             Index       RIGHT ATRIUM           Index LA diam:        4.40 cm 2.74 cm/m  RA Area:     17.50 cm LA Vol (A2C):   48.3 ml 30.12 ml/m RA Volume:   48.90 ml  30.49 ml/m LA Vol (A4C):   91.1 ml 56.77  ml/m LA Biplane Vol: 53.7 ml 33.48 ml/m  AORTIC VALVE LVOT Vmax:   147.00 cm/s LVOT Vmean:  110.000 cm/s LVOT VTI:    0.394 m  AORTA Ao Root diam: 3.10 cm MITRAL VALVE MV Area (PHT): 5.13 cm     SHUNTS MV Peak grad:  13.1 mmHg    Systemic VTI:  0.39 m MV Mean grad:  8.0 mmHg     Systemic Diam: 1.90 cm MV Vmax:       1.81 m/s MV Vmean:      134.0 cm/s MV Decel Time: 148 msec MR Peak grad: 127.7 mmHg MR Mean grad: 79.0 mmHg MR Vmax:      565.00 cm/s MR Vmean:     423.0 cm/s MV E velocity: 160.00 cm/s MV A velocity: 162.00 cm/s MV E/A ratio:  0.99 Loralie Champagne MD Electronically signed by Loralie Champagne MD Signature Date/Time: 09/08/2019/5:55:06 PM    Final      LOS: 0 days   Time spent: More than 50% of that time was spent in counseling and/or coordination of care.  Antonieta Pert, MD [facility]  09/09/2019, 8:27 AM

## 2019-09-10 DIAGNOSIS — I509 Heart failure, unspecified: Secondary | ICD-10-CM | POA: Diagnosis not present

## 2019-09-10 LAB — CBC
HCT: 31.1 % — ABNORMAL LOW (ref 36.0–46.0)
Hemoglobin: 9.6 g/dL — ABNORMAL LOW (ref 12.0–15.0)
MCH: 31.1 pg (ref 26.0–34.0)
MCHC: 30.9 g/dL (ref 30.0–36.0)
MCV: 100.6 fL — ABNORMAL HIGH (ref 80.0–100.0)
Platelets: 172 10*3/uL (ref 150–400)
RBC: 3.09 MIL/uL — ABNORMAL LOW (ref 3.87–5.11)
RDW: 14 % (ref 11.5–15.5)
WBC: 6.5 10*3/uL (ref 4.0–10.5)
nRBC: 0 % (ref 0.0–0.2)

## 2019-09-10 LAB — BASIC METABOLIC PANEL
Anion gap: 15 (ref 5–15)
BUN: 66 mg/dL — ABNORMAL HIGH (ref 8–23)
CO2: 26 mmol/L (ref 22–32)
Calcium: 9.1 mg/dL (ref 8.9–10.3)
Chloride: 102 mmol/L (ref 98–111)
Creatinine, Ser: 4.65 mg/dL — ABNORMAL HIGH (ref 0.44–1.00)
GFR calc Af Amer: 9 mL/min — ABNORMAL LOW (ref >60)
GFR calc non Af Amer: 8 mL/min — ABNORMAL LOW (ref >60)
Glucose, Bld: 86 mg/dL (ref 70–99)
Potassium: 3.7 mmol/L (ref 3.5–5.1)
Sodium: 143 mmol/L (ref 135–145)

## 2019-09-10 MED ORDER — FUROSEMIDE 40 MG PO TABS
40.0000 mg | ORAL_TABLET | Freq: Every day | ORAL | Status: DC
  Administered 2019-09-11: 40 mg via ORAL
  Filled 2019-09-10: qty 1

## 2019-09-10 NOTE — Progress Notes (Signed)
PROGRESS NOTE    Yvonne Powell  PIR:[id number] DOB: December 22, 1928 DOA: 09/08/2019 PCP: Leeroy Cha, MD   Brief Narrative:  86 y.o.femalewith medical history significant ofanemia, stage V CKD, history of DVT, gallstones, hypertension, hypothyroid this then after having Graves' disease who is coming tothe emergency department due to dyspnea and lower extremity edema since this morning. However, the patient states that for the previous 2 days she was feeling very tired, but at that time she had not noticed any lower extremity edema. She has had some orthopnea. She also stated that she has noticed she has difficulty feeding her close today and thinks that she is retaining some fluid in her abdomen. She denies chest pain, palpitations, dizziness, diaphoresis, PND. Fever, chills, rhinorrhea, sore throat, productive cough, wheezing or hemoptysis. No abdominal pain, nausea, emesis, recent diarrhea or constipation, melena or hematochezia. No dysuria, frequency or hematuria. No polyuria, polydipsia, polyphagia or blurred vision.  ED Course:Initial vital signs were temperature 97.6 F, pulse 74, respirations 16, blood pressure 164/60 mmHg and O2 sat 97% on room air.The patient had an nitro paste patch placed in the ED. I added furosemide 80 mg IVP x1. She stated that she feels better. White count 6.0, hemoglobin 10.2 g/dL and platelets 175. Sodium 142, potassium 3.4, chloride 102 and CO2 26 mmol/L. Glucose 114, BUN 56, creatinine 4.39 and calcium 8.9 mg/dL. Chest radiograph shows pulmonary edema with small left effusion. More patchy opacity in the left lung base could reflect alveolar edema versus infection.  She reported she woke up on bed Wednesday short of breath. Was tired on Sunday and Monday and did not sleep well.Had swollen ankle on Wednesday. Lives with son at home who is there to help her 24x7 at home. Pateint was admitted and being diuresed. With IV diuresis feeling  significantly better.  Subjective:  Feels much better Bun/creat rising On RA now.  mild leg swelling but better overall   Assessment & Plan:  Shortness of breath/leg edema:Suspecting 2/2 new onset CHF, acute diastolic and also contributed by CKD stage V: Presented with leg edema, chest x-ray suggestive of pulmonary edema with a small left pleural effusion. Symptoms seems to be improving, echocardiogram shows EF 50 to 55% septal lateral dyssynchrony RV systolic function normal grade 1 diastolic dysfunction.  Appreciate cardiology input.  Given rising BUN/creatinine holding Lasix today per nephrology.  Monitor BMP in a.m.   Acute hypoxic respiratory failure due to CHF.  Weaned off oxygen.    Mildly positive troponin likely demand mismatch, cardiology consulted no plan for ischemic evaluation at  this time.  CKD stage V not on dialysis followed by Dr. Hollie Salk, baseline creatinine 3.6-3.7 , bun 30s in August/2020 . creatinine uptrending-consulted nephrology this morning seen by Dr. Marval Regal and iv Lasix held.  Repeating BMP in a.m. Recent Labs  Lab 09/08/19 0155 09/09/19 0613 09/10/19 0702  BUN 56* 58* 66*  CREATININE 4.39* 4.51* 4.65*   Hypokalemia replaced  Hypertension: BP controlled. cont her Cardizem  and doxazosin.  Macrocytic anemia/anemia of chronic renal disease: Hemoglobin overall stable, Y30 160 and folic acid 68- WNL. Monitor. Recent Labs  Lab 09/08/19 0155 09/09/19 0613 09/10/19 0702  HGB 10.2* 9.3* 9.6*  HCT 33.1* 30.1* 31.1*   Hypothyroidism: Continue home Synthroid  DVT prophylaxis:lovenox Code Status: FULL Family Communication: plan of care discussed with patient at bedside.  Status is: Inpatient  Remains inpatient appropriate because:Inpatient level of care appropriate due to severity of illness and Further monitoring of renal function in the setting  of acute CHF that was treated with IV diuretics   Dispo: The patient is from: Home               Anticipated d/c is to: Home              Anticipated d/c date is: 1 day              Patient currently is not medically stable to d/c. Nutrition: Diet Order            Diet renal with fluid restriction Fluid restriction: 1200 mL Fluid; Room service appropriate? Yes; Fluid consistency: Thin  Diet effective now             Body mass index is 20.58 kg/m. Consultants: cardiology Procedures:see note Microbiology:see note  Medications: Scheduled Meds: . calcitRIOL  0.25 mcg Oral Once per day on Mon Fri  . cholecalciferol  2,000 Units Oral Daily  . diltiazem  300 mg Oral Daily  . doxazosin  8 mg Oral Daily  . enoxaparin (LOVENOX) injection  30 mg Subcutaneous Daily  . [START ON 09/11/2019] furosemide  40 mg Oral Daily  . levothyroxine  88 mcg Oral QAC breakfast  . multivitamin  1 tablet Oral Daily  . sodium chloride flush  3 mL Intravenous Once   Continuous Infusions:  Antimicrobials: Anti-infectives (From admission, onward)   None       Objective: Vitals: Today's Vitals   09/10/19 0005 09/10/19 0404 09/10/19 0624 09/10/19 0724  BP:  (!) 153/55  (!) 161/64  Pulse:  70  68  Resp:  16  20  Temp:  97.7 F (36.5 C)  98 F (36.7 C)  TempSrc:  Oral  Oral  SpO2:  94% 98% 97%  Weight: 52.7 kg     Height:      PainSc:        Intake/Output Summary (Last 24 hours) at 09/10/2019 1106 Last data filed at 09/10/2019 0829 Gross per 24 hour  Intake 960 ml  Output 1950 ml  Net -990 ml   Filed Weights   09/08/19 1700 09/09/19 0411 09/10/19 0005  Weight: 53.9 kg 53 kg 52.7 kg   Weight change: -1.225 kg   Intake/Output from previous day: 05/07 0701 - 05/08 0700 In: 1200 [P.O.:1200] Out: 1850 [Urine:1850] Intake/Output this shift: Total I/O In: -  Out: 300 [Urine:300]  Examination:  General exam: AAOx3,NAD, weak appearing. HEENT:Oral mucosa moist, Ear/Nose WNL grossly,dentition normal. Respiratory system: bilaterally clear breath sounds, no use of accessory muscle, non  tender. Cardiovascular system: S1 & S2 +, regular, No JVD. Gastrointestinal system: Abdomen soft, NT,ND, BS+. Nervous System:Alert, awake, moving extremities and grossly nonfocal Extremities: Improved leg edema , distal peripheral pulses palpable.  Skin: No rashes,no icterus. MSK: Normal muscle bulk,tone, power  Data Reviewed: I have personally reviewed following labs and imaging studies CBC: Recent Labs  Lab 09/08/19 0155 09/09/19 0613 09/10/19 0702  WBC 6.0 6.5 6.5  HGB 10.2* 9.3* 9.6*  HCT 33.1* 30.1* 31.1*  MCV 101.5* 101.0* 100.6*  PLT 175 173 712   Basic Metabolic Panel: Recent Labs  Lab 09/08/19 0155 09/09/19 0613 09/10/19 0702  NA 142 142 143  K 3.4* 3.7 3.7  CL 102 105 102  CO2 26 29 26   GLUCOSE 114* 83 86  BUN 56* 58* 66*  CREATININE 4.39* 4.51* 4.65*  CALCIUM 8.9 8.4* 9.1   GFR: Estimated Creatinine Clearance: 6.8 mL/min (A) (by C-G formula based on SCr of 4.65 mg/dL (H)). Liver  Function Tests: No results for input(s): AST, ALT, ALKPHOS, BILITOT, PROT, ALBUMIN in the last 168 hours. No results for input(s): LIPASE, AMYLASE in the last 168 hours. No results for input(s): AMMONIA in the last 168 hours. Coagulation Profile: No results for input(s): INR, PROTIME in the last 168 hours. Cardiac Enzymes: No results for input(s): CKTOTAL, CKMB, CKMBINDEX, TROPONINI in the last 168 hours. BNP (last 3 results) No results for input(s): PROBNP in the last 8760 hours. HbA1C: No results for input(s): HGBA1C in the last 72 hours. CBG: No results for input(s): GLUCAP in the last 168 hours. Lipid Profile: No results for input(s): CHOL, HDL, LDLCALC, TRIG, CHOLHDL, LDLDIRECT in the last 72 hours. Thyroid Function Tests: No results for input(s): TSH, T4TOTAL, FREET4, T3FREE, THYROIDAB in the last 72 hours. Anemia Panel: Recent Labs    09/08/19 0155  [id number]  FOLATE 68.1   Sepsis Labs: No results for input(s): PROCALCITON, LATICACIDVEN in the last 168  hours.  Recent Results (from the past 240 hour(s))  Respiratory Panel by RT PCR (Flu A&B, Covid) - Nasopharyngeal Swab     Status: None   Collection Time: 09/08/19  5:55 AM   Specimen: Nasopharyngeal Swab  Result Value Ref Range Status   SARS Coronavirus 2 by RT PCR NEGATIVE NEGATIVE Final    Comment: (NOTE) SARS-CoV-2 target nucleic acids are NOT DETECTED. The SARS-CoV-2 RNA is generally detectable in upper respiratoy specimens during the acute phase of infection. The lowest concentration of SARS-CoV-2 viral copies this assay can detect is 131 copies/mL. A negative result does not preclude SARS-Cov-2 infection and should not be used as the sole basis for treatment or other patient management decisions. A negative result may occur with  improper specimen collection/handling, submission of specimen other than nasopharyngeal swab, presence of viral mutation(s) within the areas targeted by this assay, and inadequate number of viral copies (<131 copies/mL). A negative result must be combined with clinical observations, patient history, and epidemiological information. The expected result is Negative. Fact Sheet for Patients:  PinkCheek.be Fact Sheet for Healthcare Providers:  GravelBags.it This test is not yet ap proved or cleared by the [place] FDA and  has been authorized for detection and/or diagnosis of SARS-CoV-2 by FDA under an Emergency Use Authorization (EUA). This EUA will remain  in effect (meaning this test can be used) for the duration of the COVID-19 declaration under Section 564(b)(1) of the Act, 21 U.S.C. section 360bbb-3(b)(1), unless the authorization is terminated or revoked sooner.    Influenza A by PCR NEGATIVE NEGATIVE Final   Influenza B by PCR NEGATIVE NEGATIVE Final    Comment: (NOTE) The Xpert Xpress SARS-CoV-2/FLU/RSV assay is intended as an aid in  the diagnosis of influenza from Nasopharyngeal  swab specimens and  should not be used as a sole basis for treatment. Nasal washings and  aspirates are unacceptable for Xpert Xpress SARS-CoV-2/FLU/RSV  testing. Fact Sheet for Patients: PinkCheek.be Fact Sheet for Healthcare Providers: GravelBags.it This test is not yet approved or cleared by the [place] FDA and  has been authorized for detection and/or diagnosis of SARS-CoV-2 by  FDA under an Emergency Use Authorization (EUA). This EUA will remain  in effect (meaning this test can be used) for the duration of the  Covid-19 declaration under Section 564(b)(1) of the Act, 21  U.S.C. section 360bbb-3(b)(1), unless the authorization is  terminated or revoked. Performed at [facility], [place] [address], [place], [place] [address]  Radiology Studies: No results found.   LOS: 1 day   Time spent: More than 50% of that time was spent in counseling and/or coordination of care.  Antonieta Pert, MD [facility]  09/10/2019, 11:06 AM

## 2019-09-10 NOTE — Progress Notes (Signed)
Patient had O2 off for about 4hrs, sats 95-100%.

## 2019-09-10 NOTE — Consult Note (Signed)
Reason for Consult: AKI/CKD stage V not yet on dialysis Referring Physician:  Lupita Leash, MD  Yvonne Powell is an 86 y.o. female has a PMH significant for HTN, hypothyroidism (following Graves' disease), h/o DVT, CKD stage V (not yet on dialysis and followed closely by Dr. Hollie Salk), and anemia of CKD who presented to [facility] ED with a 2 day history of worsening lower extremity edema and shortness of breath.  In the ED she was hypertensive, O2 sat of 97% on room air.  CXR revealed small left pleural effusion with some pulmonary edema.  She was started on IV lasix 80 mg and admitted for further evaluation by cardiology.  ECHO revealed preserved systolic function and Grade I DD.  She was continued on Lasix 80 mg IV bid with improvement of her edema and SOB.  We were consulted due to the development of AKI/CKD stage V following diuresis.  The trend in Scr is seen below.   She denies any N/V/D, CP, orthopnea, or PND.  No dysuria, pyuria, hematuria, urgency, frequency, or retention.  Today she feels much better.   Trend in Creatinine: Creatinine, Ser  Date/Time Value Ref Range Status  09/10/2019 07:02 AM 4.65 (H) 0.44 - 1.00 mg/dL Final  09/09/2019 06:13 AM 4.51 (H) 0.44 - 1.00 mg/dL Final  09/08/2019 01:55 AM 4.39 (H) 0.44 - 1.00 mg/dL Final  12/28/2018 07:09 AM 3.68 (H) 0.44 - 1.00 mg/dL Final  12/27/2018 06:01 AM 3.70 (H) 0.44 - 1.00 mg/dL Final  12/26/2018 06:34 AM 3.74 (H) 0.44 - 1.00 mg/dL Final  12/25/2018 04:27 AM 4.16 (H) 0.44 - 1.00 mg/dL Final  12/24/2018 07:06 AM 4.38 (H) 0.44 - 1.00 mg/dL Final  12/23/2018 03:48 AM 4.59 (H) 0.44 - 1.00 mg/dL Final  12/22/2018 06:30 AM 4.90 (H) 0.44 - 1.00 mg/dL Final  12/21/2018 05:39 AM 4.06 (H) 0.44 - 1.00 mg/dL Final  12/20/2018 08:35 AM 4.29 (H) 0.44 - 1.00 mg/dL Final  07/19/2017 02:40 AM 3.22 (H) 0.44 - 1.00 mg/dL Final  11/28/2015 10:34 PM 2.44 (H) 0.44 - 1.00 mg/dL Final  11/07/2015 03:52 AM 2.30 (H) 0.44 - 1.00 mg/dL Final    PMH:   Past Medical  History:  Diagnosis Date  . Anemia    due to kidney  . DVT (deep venous thrombosis) (HCC)    hx  . Gallstones 12/2018  . Hypertension   . Renal disorder   . Thyroid disease    graves dx    PSH:   Past Surgical History:  Procedure Laterality Date  . AV FISTULA PLACEMENT Right 04/07/2017   Procedure: ARTERIOVENOUS (AV) FISTULA CREATION;  Surgeon: Elam Dutch, MD;  Location: [place];  Service: Vascular;  Laterality: Right;  . CHOLECYSTECTOMY N/A 12/23/2018   Procedure: LAPAROSCOPIC CHOLECYSTECTOMY;  Surgeon: Erroll Luna, MD;  Location: [place];  Service: General;  Laterality: N/A;  . COLONOSCOPY    . DILATION AND CURETTAGE OF UTERUS    . EYE SURGERY     cactaracts  . LIPOMA EXCISION    . LUMBAR EPIDURAL INJECTION  2018    Allergies:  Allergies  Allergen Reactions  . Doxycycline Anaphylaxis  . Fish Allergy Anaphylaxis  . Shellfish Allergy Anaphylaxis  . Clindamycin/Lincomycin Diarrhea  . Compazine [Prochlorperazine Edisylate] Other (See Comments)    Makes her want to tear off her skin  . Iodine     All seafood  . Sulfa Antibiotics Hives  . Penicillins Rash    Has patient had a PCN reaction causing immediate rash, facial/tongue/throat  swelling, SOB or lightheadedness with hypotension: Yesyes Has patient had a PCN reaction causing severe rash involving mucus membranes or skin necrosis: Nono Has patient had a PCN reaction that required hospitalization Nono Has patient had a PCN reaction occurring within the last 10 years: Nono If all of the above answers are "NO", then may proceed with Cephalosporin    Medications:   Prior to Admission medications   Medication Sig Start Date End Date Taking? Authorizing Provider  calcitRIOL (ROCALTROL) 0.25 MCG capsule Take 0.25 mcg by mouth 2 (two) times a week. Monday, Friday   Yes [provider]  Cholecalciferol (VITAMIN D) 2000 units tablet Take 2,000 Units by mouth daily.   Yes [provider]  diltiazem  (TIAZAC) 300 MG 24 hr capsule Take 300 mg by mouth daily.   Yes [provider]  doxazosin (CARDURA) 8 MG tablet Take 8 mg by mouth daily.   Yes [provider]  Iron-FA-DSS-B Cmplx-Vit C (NEPHRON FA PO) Take 1 tablet by mouth daily.   Yes [provider]  levothyroxine (SYNTHROID, LEVOTHROID) 88 MCG tablet Take 88 mcg by mouth daily before breakfast.   Yes [provider]  LOKELMA 10 g PACK packet Take 1 packet by mouth every other day.  08/26/19  Yes [provider]  OVER THE COUNTER MEDICATION Take 2.5 mLs by mouth daily. Sodium Bicarb powder in 4 oz of water   Yes [provider]  Polyethyl Glycol-Propyl Glycol (SYSTANE) 0.4-0.3 % SOLN Place 1 drop into both eyes daily as needed (Dry eyes).   Yes [provider]  triamcinolone (NASACORT ALLERGY 24HR) 55 MCG/ACT AERO nasal inhaler Place 2 sprays into the nose daily as needed (Allergies).    Yes [provider]  docusate sodium (COLACE) 100 MG capsule Take 100 mg by mouth daily as needed for moderate constipation.     [provider]  ondansetron (ZOFRAN) 4 MG tablet Take 1 tablet (4 mg total) by mouth every 6 (six) hours as needed for nausea. Patient not taking: Reported on 09/08/2019 12/28/18   Cristal Ford, DO  oxyCODONE-acetaminophen (ROXICET) 5-325 MG tablet Take 1 tablet by mouth every 8 (eight) hours as needed for severe pain. Patient not taking: Reported on 12/20/2018 04/07/17   Gabriel Earing, PA-C    Inpatient medications: . calcitRIOL  0.25 mcg Oral Once per day on Mon Fri  . cholecalciferol  2,000 Units Oral Daily  . diltiazem  300 mg Oral Daily  . doxazosin  8 mg Oral Daily  . enoxaparin (LOVENOX) injection  30 mg Subcutaneous Daily  . [START ON 09/11/2019] furosemide  40 mg Oral Daily  . levothyroxine  88 mcg Oral QAC breakfast  . multivitamin  1 tablet Oral Daily  . sodium chloride flush  3 mL Intravenous Once    Discontinued Meds:    Medications Discontinued During This Encounter  Medication Reason  . famotidine (PEPCID) 20 MG tablet Patient Preference  . hydroxypropyl methylcellulose / hypromellose (ISOPTO TEARS / GONIOVISC) 2.5 % ophthalmic solution Patient Preference  . sodium zirconium cyclosilicate (LOKELMA) packet 10 g   . furosemide (LASIX) injection 40 mg     Social History:  reports that she has never smoked. She has never used smokeless tobacco. She reports that she does not drink alcohol or use drugs.  Family History:   Family History  Problem Relation Age of Onset  . Congestive Heart Failure Maternal Grandmother 76  . Hypertension Maternal Grandmother     Pertinent  items are noted in HPI. Weight change: -1.225 kg  Intake/Output Summary (Last 24 hours) at 09/10/2019 1421 Last data filed at 09/10/2019 1300 Gross per 24 hour  Intake 720 ml  Output 2050 ml  Net -1330 ml   BP (!) 134/56 (BP Location: Left Arm)   Pulse 70   Temp 97.6 F (36.4 C) (Oral)   Resp 20   Ht 5\' 3"  (1.6 m)   Wt 52.7 kg   SpO2 97%   BMI 20.58 kg/m  Vitals:   09/10/19 0404 09/10/19 0624 09/10/19 0724 09/10/19 1344  BP: (!) 153/55  (!) 161/64 (!) 134/56  Pulse: 70  68 70  Resp: 16  20 20   Temp: 97.7 F (36.5 C)  98 F (36.7 C) 97.6 F (36.4 C)  TempSrc: Oral  Oral Oral  SpO2: 94% 98% 97% 97%  Weight:      Height:         General appearance: alert, cooperative, no distress and appears younger than stated age Head: Normocephalic, without obvious abnormality, atraumatic Resp: clear to auscultation bilaterally Cardio: regular rate and rhythm, S1, S2 normal, no murmur, click, rub or gallop GI: soft, non-tender; bowel sounds normal; no masses,  no organomegaly Extremities: extremities normal, atraumatic, no cyanosis or edema.  RUE AVF +T/B  Labs: Basic Metabolic Panel: Recent Labs  Lab 09/08/19 0155 09/09/19 0613 09/10/19 0702  NA 142 142 143  K 3.4* 3.7 3.7  CL 102 105 102  CO2 26 29 26   GLUCOSE 114* 83 86   BUN 56* 58* 66*  CREATININE 4.39* 4.51* 4.65*  CALCIUM 8.9 8.4* 9.1   Liver Function Tests: No results for input(s): AST, ALT, ALKPHOS, BILITOT, PROT, ALBUMIN in the last 168 hours. No results for input(s): LIPASE, AMYLASE in the last 168 hours. No results for input(s): AMMONIA in the last 168 hours. CBC: Recent Labs  Lab 09/08/19 0155 09/09/19 0613 09/10/19 0702  WBC 6.0 6.5 6.5  HGB 10.2* 9.3* 9.6*  HCT 33.1* 30.1* 31.1*  MCV 101.5* 101.0* 100.6*  PLT 175 173 172   PT/INR: @LABRCNTIP (inr:5) Cardiac Enzymes: )No results for input(s): CKTOTAL, CKMB, CKMBINDEX, TROPONINI in the last 168 hours. CBG: No results for input(s): GLUCAP in the last 168 hours.  Iron Studies: No results for input(s): IRON, TIBC, TRANSFERRIN, FERRITIN in the last 168 hours.  Xrays/Other Studies: No results found.   Assessment/Plan: 1.  AKI/CKD stage V- mild bump in Scr following IV diuresis likely due to cardiorenal syndrome.  IV lasix held now that her volume status has improved.  No uremic symptoms.  Will continue to follow UOP and Scr.  No indication for dialysis at this time. 2. Acute CHF- has some diastolic dysfunction but preserved EF.  Improved with diuresis. 1. Stop IV lasix 2. Will start po lasix in am 3. Low Na diet 3. SOB- likely combination of #2 and her anemia.  Improved and is on room air. 4. HTN- BP elevated.  Continue with home meds 5. Anemia of CKD stage V-   She received SQ retacrit 20,000 units on 08/30/19.  Managed by Dr. Hollie Salk.  Hgb had improved to 10.6 on 4/27 and is now down to 9.6 likely due to blood draws during the hospitalization.  TSAT was 21 %.  May benefit from IV feraheme.  Will check iron stores. 6. Vascular access- RUE AVF is mature and ready for use if needed.    Governor Rooks Johanny Segers 09/10/2019, 2:21 PM

## 2019-09-10 NOTE — Progress Notes (Signed)
   Chart reviewed - creatinine uptrending. Lasix held. Nephrology on board. Echo stable, no plans for further ischemic workup for troponin elevation. Appreciate nephrology and hospitalist assistance. No further suggestions.  [facility] will sign off.   Medication Recommendations:  Continue current Other recommendations (labs, testing, etc):  none Follow up as an outpatient:  Dr. Kerby Less, MD, [facility], [place] Director of the [facility]  [facility] Diplomate of the American Board of Clinical Lipidology Attending Cardiologist  Direct Dial: [phone]  Fax: [phone]  Website:  [email or web address]

## 2019-09-11 DIAGNOSIS — I509 Heart failure, unspecified: Secondary | ICD-10-CM | POA: Diagnosis not present

## 2019-09-11 LAB — FERRITIN: Ferritin: 265 ng/mL (ref 11–307)

## 2019-09-11 LAB — IRON AND TIBC
Iron: 27 ug/dL — ABNORMAL LOW (ref 28–170)
Saturation Ratios: 13 % (ref 10.4–31.8)
TIBC: 202 ug/dL — ABNORMAL LOW (ref 250–450)
UIBC: 175 ug/dL

## 2019-09-11 LAB — BASIC METABOLIC PANEL
Anion gap: 11 (ref 5–15)
BUN: 72 mg/dL — ABNORMAL HIGH (ref 8–23)
CO2: 27 mmol/L (ref 22–32)
Calcium: 8.8 mg/dL — ABNORMAL LOW (ref 8.9–10.3)
Chloride: 106 mmol/L (ref 98–111)
Creatinine, Ser: 4.92 mg/dL — ABNORMAL HIGH (ref 0.44–1.00)
GFR calc Af Amer: 8 mL/min — ABNORMAL LOW (ref >60)
GFR calc non Af Amer: 7 mL/min — ABNORMAL LOW (ref >60)
Glucose, Bld: 88 mg/dL (ref 70–99)
Potassium: 4 mmol/L (ref 3.5–5.1)
Sodium: 144 mmol/L (ref 135–145)

## 2019-09-11 LAB — CBC
HCT: 29.7 % — ABNORMAL LOW (ref 36.0–46.0)
Hemoglobin: 9.1 g/dL — ABNORMAL LOW (ref 12.0–15.0)
MCH: 30.5 pg (ref 26.0–34.0)
MCHC: 30.6 g/dL (ref 30.0–36.0)
MCV: 99.7 fL (ref 80.0–100.0)
Platelets: 188 10*3/uL (ref 150–400)
RBC: 2.98 MIL/uL — ABNORMAL LOW (ref 3.87–5.11)
RDW: 13.9 % (ref 11.5–15.5)
WBC: 5 10*3/uL (ref 4.0–10.5)
nRBC: 0 % (ref 0.0–0.2)

## 2019-09-11 MED ORDER — FUROSEMIDE 40 MG PO TABS: 40.0000 mg | ORAL_TABLET | Freq: Every day | ORAL | 0 refills | Status: DC

## 2019-09-11 NOTE — Progress Notes (Signed)
Patient ID: Yvonne Powell, female   DOB: 25-Nov-1928, 86 y.o.   MRN: [id number] S: She feels much better O:BP (!) 161/69 (BP Location: Left Arm)   Pulse 77   Temp 97.9 F (36.6 C) (Oral)   Resp 18   Ht 5\' 3"  (1.6 m)   Wt 51.5 kg Comment: scale b  SpO2 97%   BMI 20.11 kg/m   Intake/Output Summary (Last 24 hours) at 09/11/2019 1117 Last data filed at 09/11/2019 0847 Gross per 24 hour  Intake 480 ml  Output 900 ml  Net -420 ml   Intake/Output: I/O last 3 completed shifts: In: 840 [P.O.:840] Out: 2250 [Urine:2250]  Intake/Output this shift:  Total I/O In: 120 [P.O.:120] Out: -  Weight change: -1.225 kg Gen: NAD CVS: no rub Resp: CTA Abd: +BS, soft, Nt/ND Ext: no edema  Recent Labs  Lab 09/08/19 0155 09/09/19 0613 09/10/19 0702 09/11/19 0431  NA 142 142 143 144  K 3.4* 3.7 3.7 4.0  CL 102 105 102 106  CO2 26 29 26 27   GLUCOSE 114* 83 86 88  BUN 56* 58* 66* 72*  CREATININE 4.39* 4.51* 4.65* 4.92*  CALCIUM 8.9 8.4* 9.1 8.8*   Liver Function Tests: No results for input(s): AST, ALT, ALKPHOS, BILITOT, PROT, ALBUMIN in the last 168 hours. No results for input(s): LIPASE, AMYLASE in the last 168 hours. No results for input(s): AMMONIA in the last 168 hours. CBC: Recent Labs  Lab 09/08/19 0155 09/08/19 0155 09/09/19 0613 09/10/19 0702 09/11/19 0431  WBC 6.0   < > 6.5 6.5 5.0  HGB 10.2*   < > 9.3* 9.6* 9.1*  HCT 33.1*   < > 30.1* 31.1* 29.7*  MCV 101.5*  --  101.0* 100.6* 99.7  PLT 175   < > 173 172 188   < > = values in this interval not displayed.   Cardiac Enzymes: No results for input(s): CKTOTAL, CKMB, CKMBINDEX, TROPONINI in the last 168 hours. CBG: No results for input(s): GLUCAP in the last 168 hours.  Iron Studies:  Recent Labs    09/11/19 0431  IRON 27*  TIBC 202*  FERRITIN 265   Studies/Results: No results found. . calcitRIOL  0.25 mcg Oral Once per day on Mon Fri  . cholecalciferol  2,000 Units Oral Daily  . diltiazem  300 mg Oral Daily   . doxazosin  8 mg Oral Daily  . enoxaparin (LOVENOX) injection  30 mg Subcutaneous Daily  . furosemide  40 mg Oral Daily  . levothyroxine  88 mcg Oral QAC breakfast  . multivitamin  1 tablet Oral Daily  . sodium chloride flush  3 mL Intravenous Once    BMET    Component Value Date/Time   NA 144 09/11/2019 0431   K 4.0 09/11/2019 0431   CL 106 09/11/2019 0431   CO2 27 09/11/2019 0431   GLUCOSE 88 09/11/2019 0431   BUN 72 (H) 09/11/2019 0431   CREATININE 4.92 (H) 09/11/2019 0431   CALCIUM 8.8 (L) 09/11/2019 0431   GFRNONAA 7 (L) 09/11/2019 0431   GFRAA 8 (L) 09/11/2019 0431   CBC    Component Value Date/Time   WBC 5.0 09/11/2019 0431   RBC 2.98 (L) 09/11/2019 0431   HGB 9.1 (L) 09/11/2019 0431   HCT 29.7 (L) 09/11/2019 0431   PLT 188 09/11/2019 0431   MCV 99.7 09/11/2019 0431   MCH 30.5 09/11/2019 0431   MCHC 30.6 09/11/2019 0431   RDW 13.9 09/11/2019 0431   LYMPHSABS  1.5 12/20/2018 0835   MONOABS 0.4 12/20/2018 0835   EOSABS 0.1 12/20/2018 0835   BASOSABS 0.0 12/20/2018 0835    Assessment/Plan: 1.  AKI/CKD stage V- mild bump in Scr following IV diuresis likely due to cardiorenal syndrome.  IV lasix held now that her volume status has improved.  No uremic symptoms.  No indication for dialysis at this time.  She is stable for discharge today and I will arrange for labs in 2 days at our office for follow up.  2. Acute CHF- has some diastolic dysfunction but preserved EF.  Improved with diuresis. 1. Stopped IV lasix 2. started po lasix 40 mg today 3. Low Na diet 3. SOB- likely combination of #2 and her anemia.  Improved and is on room air. 4. HTN- BP elevated.  Continue with home meds 5. Anemia of CKD stage V-   She received SQ retacrit 20,000 units on 08/30/19.  Managed by Dr. Hollie Salk.  Hgb had improved to 10.6 on 4/27 and is now down to 9.6 likely due to blood draws during the hospitalization.  TSAT was 21 %.  May benefit from IV feraheme.  Will arrange for outpatient with  her ESA injections. 6. Vascular access- RUE AVF is mature and ready for use if needed.  7. Disposition- stable for discharge and will have repeat labs in 2 days at our office.   Donetta Potts, MD [employer] [phone]

## 2019-09-11 NOTE — Discharge Summary (Signed)
Physician Discharge Summary  Kriston Mckinnie UMP:[id number] DOB: 1928-11-20 DOA: 09/08/2019  PCP: Leeroy Cha, MD  Admit date: 09/08/2019 Discharge date: 09/11/2019  Admitted From: home Disposition:  home  Recommendations for Outpatient Follow-up:  1. Follow up with PCP in 1-2 weeks 2. Please obtain BMP in 2 days from Dr Bishop [place] office 3. Please follow up on the following pending results:  Home Health: Yes- RN  Equipment/Devices: None  Discharge Condition: Stable Code Status: Full Diet recommendation:  Diet Order            Diet - low sodium heart healthy        Diet renal with fluid restriction Fluid restriction: 1200 mL Fluid; Room service appropriate? Yes; Fluid consistency: Thin  Diet effective now               Brief/Interim Summary: 86 y.o.femalewith medical history significant ofanemia, stage V CKD w/ mature AVF in place, history of DVT, gallstones, hypertension, hypothyroid this then after having Graves' disease presented to the ED with dyspnea, lower extremity swelling  Since the morning of admission along with feeling very tired 2 days PTA,  and some orthopnea.  In ED: Initial vital signs were temperature 97.6 F, pulse 74, respirations 16, blood pressure 164/60 mmHg and O2 sat 97% on room air.The patient had an nitro paste patch placed in the ED. I added furosemide 80 mg IVP x1. She stated that she feels better. White count 6.0, hemoglobin 10.2 g/dL and platelets 175. Sodium 142, potassium 3.4, chloride 102 and CO2 26 mmol/L. Glucose 114, BUN 56, creatinine 4.39 and calcium 8.9 mg/dL. Chest radiograph shows pulmonary edema with small left effusion. More patchy opacity in the left lung base could reflect alveolar edema versus infection. Cardiology was consulted patient was admitted for further management. Diuresed with IV Lasix with improvement in her swelling and dyspnea.  She had slight bump in BUN/creatinine and was seen by her nephrologist 5/8-IV Lasix  was discontinued and switched to p.o. Lasix. By nephrology okay to discharge on oral Lasix 40 mg daily and follow-up lab as outpatient in 2 days.  Discharge Diagnoses:  Assessment & Plan:  Acute diastolic CHF: in the setting of CKD stage V. Presented with leg edema, chest x-ray suggestive of pulmonary edema with a small left pleural effusion. Echocardiogram shows EF 50 to 55% septal lateral dyssynchrony RV systolic function normal grade 1 diastolic dysfunction.  Seen by cardiology and underwent diuresis with IV Lasix with improvement in the symptoms. Lasix iv held 5/8 due to bump in creatinine. Wt improving. 119>116>113 lb today. Diuresis per nephro-with oral lasix 40 mg daily and okay discharge home today.  Acute hypoxic respiratory failure due to CHF. Resolved. Weaned off oxygen.    Mildly positive troponin likely demand mismatch, cardiology consulted no plan for ischemic evaluation at  this time.  AKI on CKD stage V not on dialysis followed by Dr. Hollie Salk, baseline creatinine 3.6-3.7 , bun 30s in August/2020 . creatinine uptrending 2/2 cardiorenal syndrome.  Nephrology on board, as AV fistula maturing in place in case he needs dialysis. Diuretics as per neophro.  Lasix IV held 5/8.  Now on oral Lasix 40 mg daily to continue and follow-up with BMP in 2 days at nephrology office. Recent Labs  Lab 09/08/19 0155 09/09/19 0613 09/10/19 0702 09/11/19 0431  BUN 56* 58* 66* 72*  CREATININE 4.39* 4.51* 4.65* 4.92*   Hypokalemia replaced  Hypertension: BP controlled. cont her Cardizem  and doxazosin.  Macrocytic anemia/anemia of chronic renal  disease: Hemoglobin overall stable, I78 676 and folic acid 68- WNL. Monitor. Recent Labs  Lab 09/08/19 0155 09/09/19 0613 09/10/19 0702 09/11/19 0431  HGB 10.2* 9.3* 9.6* 9.1*  HCT 33.1* 30.1* 31.1* 29.7*   Hypothyroidism: Continue home Synthroid  Subjective:  Seen and examined this morning.  Feels well on room air.  No shortness of breath.  Leg  edema improved. No acute events overnight.  BUN/creatinine uptrending.  Consultants: cardiology Procedures:see note Microbiology:see note  Consults:  nephrology  Discharge Exam: Vitals:   09/11/19 0451 09/11/19 1203  BP: (!) 161/69 (!) 152/73  Pulse: 77 79  Resp: 18 20  Temp: 97.9 F (36.6 C) (!) 97.5 F (36.4 C)  SpO2: 97% 99%   General: Pt is alert, awake, not in acute distress Cardiovascular: RRR, S1/S2 +, no rubs, no gallops Respiratory: CTA bilaterally, no wheezing, no rhonchi Abdominal: Soft, NT, ND, bowel sounds + Extremities: no edema, no cyanosis  Discharge Instructions  Discharge Instructions    (HEART FAILURE PATIENTS) Call MD:  Anytime you have any of the following symptoms: 1) 3 pound weight gain in 24 hours or 5 pounds in 1 week 2) shortness of breath, with or without a dry hacking cough 3) swelling in the hands, feet or stomach 4) if you have to sleep on extra pillows at night in order to breathe.   Complete by: As directed    Diet - low sodium heart healthy   Complete by: As directed    Discharge instructions   Complete by: As directed    Please call call MD or return to ER for similar or worsening recurring problem that brought you to hospital or if any fever,nausea/vomiting,abdominal pain, uncontrolled pain, chest pain,  shortness of breath or any other alarming symptoms.  Please follow-up your doctor as instructed in a week time and call the office for appointment.  Please avoid alcohol, smoking, or any other illicit substance and maintain healthy habits including taking your regular medications as prescribed.  You were cared for by a hospitalist during your hospital stay. If you have any questions about your discharge medications or the care you received while you were in the hospital after you are discharged, you can call the unit and ask to speak with the hospitalist on call if the hospitalist that took care of you is not available. You will need to  follow-up with the kidney doctor office in next 2 days to get blood test done to monitor your kidney function.  Once you are discharged, your primary care physician will handle any further medical issues. Please note that NO REFILLS for any discharge medications will be authorized once you are discharged, as it is imperative that you return to your primary care physician (or establish a relationship with a primary care physician if you do not have one) for your aftercare needs so that they can reassess your need for medications and monitor your lab values   Increase activity slowly   Complete by: As directed      Allergies as of 09/11/2019      Reactions   Doxycycline Anaphylaxis   Fish Allergy Anaphylaxis   Shellfish Allergy Anaphylaxis   Clindamycin/lincomycin Diarrhea   Compazine [prochlorperazine Edisylate] Other (See Comments)   Makes her want to tear off her skin   Iodine    All seafood   Sulfa Antibiotics Hives   Penicillins Rash      Medication List    STOP taking these medications   ondansetron  4 MG tablet Commonly known as: ZOFRAN   oxyCODONE-acetaminophen 5-325 MG tablet Commonly known as: Roxicet     TAKE these medications   calcitRIOL 0.25 MCG capsule Commonly known as: ROCALTROL Take 0.25 mcg by mouth 2 (two) times a week. Monday, Friday   diltiazem 300 MG 24 hr capsule Commonly known as: TIAZAC Take 300 mg by mouth daily.   docusate sodium 100 MG capsule Commonly known as: COLACE Take 100 mg by mouth daily as needed for moderate constipation.   doxazosin 8 MG tablet Commonly known as: CARDURA Take 8 mg by mouth daily.   furosemide 40 MG tablet Commonly known as: LASIX Take 1 tablet (40 mg total) by mouth daily. Start taking on: Sep 12, 2019   levothyroxine 88 MCG tablet Commonly known as: SYNTHROID Take 88 mcg by mouth daily before breakfast.   Lokelma 10 g Pack packet Generic drug: sodium zirconium cyclosilicate Take 1 packet by mouth every  other day.   Nasacort Allergy 24HR 55 MCG/ACT Aero nasal inhaler Generic drug: triamcinolone Place 2 sprays into the nose daily as needed (Allergies).   NEPHRON FA PO Take 1 tablet by mouth daily.   OVER THE COUNTER MEDICATION Take 2.5 mLs by mouth daily. Sodium Bicarb powder in 4 oz of water   Systane 0.4-0.3 % Soln Generic drug: Polyethyl Glycol-Propyl Glycol Place 1 drop into both eyes daily as needed (Dry eyes).   Vitamin D 50 MCG (2000 UT) tablet Take 2,000 Units by mouth daily.      Follow-up Information    Care, [facility] Follow up.   Specialty: Home Health Services Why: [place] information: [place] [address] [phone]        Leeroy Cha, MD Follow up.   Specialty: Internal Medicine Contact information: [address] [place] [address] [phone]        Skeet Latch, MD .   Specialty: Cardiology Contact information: [address] [place] [address] [phone]          Allergies  Allergen Reactions  . Doxycycline Anaphylaxis  . Fish Allergy Anaphylaxis  . Shellfish Allergy Anaphylaxis  . Clindamycin/Lincomycin Diarrhea  . Compazine [Prochlorperazine Edisylate] Other (See Comments)    Makes her want to tear off her skin  . Iodine     All seafood  . Sulfa Antibiotics Hives  . Penicillins Rash        The results of significant diagnostics from this hospitalization (including imaging, microbiology, ancillary and laboratory) are listed below for reference.    Microbiology: Recent Results (from the past 240 hour(s))  Respiratory Panel by RT PCR (Flu A&B, Covid) - Nasopharyngeal Swab     Status: None   Collection Time: 09/08/19  5:55 AM   Specimen: Nasopharyngeal Swab  Result Value Ref Range Status   SARS Coronavirus 2 by RT PCR NEGATIVE NEGATIVE Final    Comment: (NOTE) SARS-CoV-2 target nucleic acids are NOT DETECTED. The SARS-CoV-2 RNA is  generally detectable in upper respiratoy specimens during the acute phase of infection. The lowest concentration of SARS-CoV-2 viral copies this assay can detect is 131 copies/mL. A negative result does not preclude SARS-Cov-2 infection and should not be used as the sole basis for treatment or other patient management decisions. A negative result may occur with  improper specimen collection/handling, submission of specimen other than nasopharyngeal swab, presence of viral mutation(s) within the areas targeted by this assay, and inadequate number of viral  copies (<131 copies/mL). A negative result must be combined with clinical observations, patient history, and epidemiological information. The expected result is Negative. Fact Sheet for Patients:  PinkCheek.be Fact Sheet for Healthcare Providers:  GravelBags.it This test is not yet ap proved or cleared by the [place] FDA and  has been authorized for detection and/or diagnosis of SARS-CoV-2 by FDA under an Emergency Use Authorization (EUA). This EUA will remain  in effect (meaning this test can be used) for the duration of the COVID-19 declaration under Section 564(b)(1) of the Act, 21 U.S.C. section 360bbb-3(b)(1), unless the authorization is terminated or revoked sooner.    Influenza A by PCR NEGATIVE NEGATIVE Final   Influenza B by PCR NEGATIVE NEGATIVE Final    Comment: (NOTE) The Xpert Xpress SARS-CoV-2/FLU/RSV assay is intended as an aid in  the diagnosis of influenza from Nasopharyngeal swab specimens and  should not be used as a sole basis for treatment. Nasal washings and  aspirates are unacceptable for Xpert Xpress SARS-CoV-2/FLU/RSV  testing. Fact Sheet for Patients: PinkCheek.be Fact Sheet for Healthcare Providers: GravelBags.it This test is not yet approved or cleared by the [place] FDA and   has been authorized for detection and/or diagnosis of SARS-CoV-2 by  FDA under an Emergency Use Authorization (EUA). This EUA will remain  in effect (meaning this test can be used) for the duration of the  Covid-19 declaration under Section 564(b)(1) of the Act, 21  U.S.C. section 360bbb-3(b)(1), unless the authorization is  terminated or revoked. Performed at [facility], [place] [address], [place], [place] [address]     Procedures/Studies: DG Chest 2 View  Result Date: 09/08/2019 CLINICAL DATA:  Shortness of breath EXAM: CHEST - 2 VIEW COMPARISON:  Radiograph 11/07/2015 FINDINGS: Diffuse fine reticular opacities throughout the lungs with central vascular congestion, fissural and septal thickening and a small left pleural effusion. More patchy opacity in the left lung base partially silhouettes the left heart border and diaphragm. Stable cardiomediastinal silhouette with a calcified aorta. The aorta is calcified. The remaining cardiomediastinal contours are unremarkable. Calcification in the right axillary region could reflect a mineralized joint body. IMPRESSION: 1. Findings suggest pulmonary edema with small left effusion. 2. More patchy opacity in the left lung base could reflect alveolar edema versus infection. Electronically Signed   By: Lovena Le M.D.   On: 09/08/2019 02:06   ECHOCARDIOGRAM COMPLETE  Result Date: 09/08/2019    ECHOCARDIOGRAM REPORT   Patient Name:   Yvonne Powell Date of Exam: 09/08/2019 Medical Rec #:  [id number]      Height:       66.0 in Accession #:    [id number]     Weight:       119.0 lb Date of Birth:  04-Dec-1928       BSA:          1.604 m Patient Age:    31 years       BP:           162/73 mmHg Patient Gender: F              HR:           71 bpm. Exam Location:  Inpatient Procedure: 2D Echo Indications:    CHF 428  History:        Patient has no prior history of Echocardiogram examinations.                 Risk Factors:Hypertension.  Sonographer:  Jannett Celestine  RDCS (AE) Referring Phys: [id number] DAVID MANUEL [place]  1. Left ventricular ejection fraction, by estimation, is 50 to 55%. The left ventricle has low normal function. Septal-lateral dyssynchrony consistent with LBBB. There is moderate left ventricular hypertrophy. Left ventricular diastolic parameters are consistent with Grade I diastolic dysfunction (impaired relaxation).  2. Right ventricular systolic function is normal. The right ventricular size is normal. Tricuspid regurgitation signal is inadequate for assessing PA pressure.  3. Left atrial size was moderately dilated.  4. The mitral valve is degenerative. Mild mitral valve regurgitation. No evidence of mitral stenosis.  5. The aortic valve is tricuspid. Aortic valve regurgitation is not visualized. Mild aortic valve sclerosis is present, with no evidence of aortic valve stenosis.  6. The inferior vena cava is dilated in size with <50% respiratory variability, suggesting right atrial pressure of 15 mmHg.  7. Small circumferential pericardial effusion, no evidence for tamponade. I do not think that the dilated IVC is related to the pericardial effusion. FINDINGS  Left Ventricle: Left ventricular ejection fraction, by estimation, is 50 to 55%. The left ventricle has low normal function. The left ventricle demonstrates regional wall motion abnormalities. The left ventricular internal cavity size was normal in size. There is moderate left ventricular hypertrophy. Left ventricular diastolic parameters are consistent with Grade I diastolic dysfunction (impaired relaxation). Right Ventricle: The right ventricular size is normal. No increase in right ventricular wall thickness. Right ventricular systolic function is normal. Tricuspid regurgitation signal is inadequate for assessing PA pressure. Left Atrium: Left atrial size was moderately dilated. Right Atrium: Right atrial size was normal in size. Pericardium: A small pericardial effusion is present.  Mitral Valve: The mitral valve is degenerative in appearance. There is mild calcification of the mitral valve leaflet(s). Moderate mitral annular calcification. Mild mitral valve regurgitation. No evidence of mitral valve stenosis. MV peak gradient, 13.1  mmHg. The mean mitral valve gradient is 8.0 mmHg. Tricuspid Valve: The tricuspid valve is normal in structure. Tricuspid valve regurgitation is trivial. Aortic Valve: The aortic valve is tricuspid. Aortic valve regurgitation is not visualized. Mild aortic valve sclerosis is present, with no evidence of aortic valve stenosis. Pulmonic Valve: The pulmonic valve was normal in structure. Pulmonic valve regurgitation is not visualized. Aorta: The aortic root is normal in size and structure. Venous: The inferior vena cava is dilated in size with less than 50% respiratory variability, suggesting right atrial pressure of 15 mmHg. IAS/Shunts: No atrial level shunt detected by color flow Doppler.  LEFT VENTRICLE PLAX 2D LVIDd:         5.00 cm  Diastology LVIDs:         3.30 cm  LV e' lateral:   6.85 cm/s LV PW:         1.10 cm  LV E/e' lateral: 23.4 LV IVS:        1.50 cm LVOT diam:     1.90 cm LV SV:         112 LV SV Index:   70 LVOT Area:     2.84 cm  RIGHT VENTRICLE TAPSE (M-mode): 2.5 cm LEFT ATRIUM             Index       RIGHT ATRIUM           Index LA diam:        4.40 cm 2.74 cm/m  RA Area:     17.50 cm LA Vol (A2C):   48.3 ml 30.12 ml/m RA  Volume:   48.90 ml  30.49 ml/m LA Vol (A4C):   91.1 ml 56.77 ml/m LA Biplane Vol: 53.7 ml 33.48 ml/m  AORTIC VALVE LVOT Vmax:   147.00 cm/s LVOT Vmean:  110.000 cm/s LVOT VTI:    0.394 m  AORTA Ao Root diam: 3.10 cm MITRAL VALVE MV Area (PHT): 5.13 cm     SHUNTS MV Peak grad:  13.1 mmHg    Systemic VTI:  0.39 m MV Mean grad:  8.0 mmHg     Systemic Diam: 1.90 cm MV Vmax:       1.81 m/s MV Vmean:      134.0 cm/s MV Decel Time: 148 msec MR Peak grad: 127.7 mmHg MR Mean grad: 79.0 mmHg MR Vmax:      565.00 cm/s MR Vmean:      423.0 cm/s MV E velocity: 160.00 cm/s MV A velocity: 162.00 cm/s MV E/A ratio:  0.99 Loralie Champagne MD Electronically signed by Loralie Champagne MD Signature Date/Time: 09/08/2019/5:55:06 PM    Final     Labs: BNP (last 3 results) Recent Labs    09/08/19 0437  BNP 6,578.4*   Basic Metabolic Panel: Recent Labs  Lab 09/08/19 0155 09/09/19 0613 09/10/19 0702 09/11/19 0431  NA 142 142 143 144  K 3.4* 3.7 3.7 4.0  CL 102 105 102 106  CO2 26 29 26 27   GLUCOSE 114* 83 86 88  BUN 56* 58* 66* 72*  CREATININE 4.39* 4.51* 4.65* 4.92*  CALCIUM 8.9 8.4* 9.1 8.8*   Liver Function Tests: No results for input(s): AST, ALT, ALKPHOS, BILITOT, PROT, ALBUMIN in the last 168 hours. No results for input(s): LIPASE, AMYLASE in the last 168 hours. No results for input(s): AMMONIA in the last 168 hours. CBC: Recent Labs  Lab 09/08/19 0155 09/09/19 0613 09/10/19 0702 09/11/19 0431  WBC 6.0 6.5 6.5 5.0  HGB 10.2* 9.3* 9.6* 9.1*  HCT 33.1* 30.1* 31.1* 29.7*  MCV 101.5* 101.0* 100.6* 99.7  PLT 175 173 172 188   Cardiac Enzymes: No results for input(s): CKTOTAL, CKMB, CKMBINDEX, TROPONINI in the last 168 hours. BNP: Invalid input(s): POCBNP CBG: No results for input(s): GLUCAP in the last 168 hours. D-Dimer No results for input(s): DDIMER in the last 72 hours. Hgb A1c No results for input(s): HGBA1C in the last 72 hours. Lipid Profile No results for input(s): CHOL, HDL, LDLCALC, TRIG, CHOLHDL, LDLDIRECT in the last 72 hours. Thyroid function studies No results for input(s): TSH, T4TOTAL, T3FREE, THYROIDAB in the last 72 hours.  Invalid input(s): FREET3 Anemia work up Recent Labs    09/11/19 0431  FERRITIN 265  TIBC 202*  IRON 27*   Urinalysis    Component Value Date/Time   COLORURINE STRAW (A) 09/08/2019 0812   APPEARANCEUR CLEAR 09/08/2019 0812   LABSPEC 1.006 09/08/2019 0812   PHURINE 8.0 09/08/2019 0812   GLUCOSEU NEGATIVE 09/08/2019 0812   HGBUR NEGATIVE 09/08/2019 0812    BILIRUBINUR NEGATIVE 09/08/2019 0812   KETONESUR NEGATIVE 09/08/2019 0812   PROTEINUR 100 (A) 09/08/2019 0812   NITRITE NEGATIVE 09/08/2019 0812   LEUKOCYTESUR MODERATE (A) 09/08/2019 0812   Sepsis Labs Invalid input(s): PROCALCITONIN,  WBC,  LACTICIDVEN Microbiology Recent Results (from the past 240 hour(s))  Respiratory Panel by RT PCR (Flu A&B, Covid) - Nasopharyngeal Swab     Status: None   Collection Time: 09/08/19  5:55 AM   Specimen: Nasopharyngeal Swab  Result Value Ref Range Status   SARS Coronavirus 2 by RT PCR NEGATIVE NEGATIVE  Final    Comment: (NOTE) SARS-CoV-2 target nucleic acids are NOT DETECTED. The SARS-CoV-2 RNA is generally detectable in upper respiratoy specimens during the acute phase of infection. The lowest concentration of SARS-CoV-2 viral copies this assay can detect is 131 copies/mL. A negative result does not preclude SARS-Cov-2 infection and should not be used as the sole basis for treatment or other patient management decisions. A negative result may occur with  improper specimen collection/handling, submission of specimen other than nasopharyngeal swab, presence of viral mutation(s) within the areas targeted by this assay, and inadequate number of viral copies (<131 copies/mL). A negative result must be combined with clinical observations, patient history, and epidemiological information. The expected result is Negative. Fact Sheet for Patients:  PinkCheek.be Fact Sheet for Healthcare Providers:  GravelBags.it This test is not yet ap proved or cleared by the [place] FDA and  has been authorized for detection and/or diagnosis of SARS-CoV-2 by FDA under an Emergency Use Authorization (EUA). This EUA will remain  in effect (meaning this test can be used) for the duration of the COVID-19 declaration under Section 564(b)(1) of the Act, 21 U.S.C. section 360bbb-3(b)(1), unless the  authorization is terminated or revoked sooner.    Influenza A by PCR NEGATIVE NEGATIVE Final   Influenza B by PCR NEGATIVE NEGATIVE Final    Comment: (NOTE) The Xpert Xpress SARS-CoV-2/FLU/RSV assay is intended as an aid in  the diagnosis of influenza from Nasopharyngeal swab specimens and  should not be used as a sole basis for treatment. Nasal washings and  aspirates are unacceptable for Xpert Xpress SARS-CoV-2/FLU/RSV  testing. Fact Sheet for Patients: PinkCheek.be Fact Sheet for Healthcare Providers: GravelBags.it This test is not yet approved or cleared by the [place] FDA and  has been authorized for detection and/or diagnosis of SARS-CoV-2 by  FDA under an Emergency Use Authorization (EUA). This EUA will remain  in effect (meaning this test can be used) for the duration of the  Covid-19 declaration under Section 564(b)(1) of the Act, 21  U.S.C. section 360bbb-3(b)(1), unless the authorization is  terminated or revoked. Performed at [facility], [place] [address] [place], [place] [address]      Time coordinating discharge: 25  minutes  SIGNED: Antonieta Pert, MD  [facility] 09/11/2019, 12:08 PM  If 7PM-7AM, please contact night-coverage [email or web address]

## 2019-09-11 NOTE — Plan of Care (Signed)

## 2019-09-13 ENCOUNTER — Ambulatory Visit (HOSPITAL_COMMUNITY)
Admission: RE | Admit: 2019-09-13 | Discharge: 2019-09-13 | Disposition: A | Discharge status: Home / Self Care | Payer: Medicare Other | Source: Ambulatory Visit | Attending: Nephrology | Admitting: Nephrology

## 2019-09-13 ENCOUNTER — Other Ambulatory Visit: Payer: Self-pay

## 2019-09-13 VITALS — BP 133/58 | HR 86 | Resp 18

## 2019-09-13 DIAGNOSIS — N185 Chronic kidney disease, stage 5: Secondary | ICD-10-CM | POA: Insufficient documentation

## 2019-09-13 LAB — IRON AND TIBC
Iron: 44 ug/dL (ref 28–170)
Saturation Ratios: 18 % (ref 10.4–31.8)
TIBC: 251 ug/dL (ref 250–450)
UIBC: 207 ug/dL

## 2019-09-13 LAB — POCT HEMOGLOBIN-HEMACUE: Hemoglobin: 10.3 g/dL — ABNORMAL LOW (ref 12.0–15.0)

## 2019-09-13 LAB — FERRITIN: Ferritin: 311 ng/mL — ABNORMAL HIGH (ref 11–307)

## 2019-09-13 MED ORDER — EPOETIN ALFA-EPBX 10000 UNIT/ML IJ SOLN: 20000.0000 [IU] | INTRAMUSCULAR | Status: DC

## 2019-09-13 MED ORDER — EPOETIN ALFA-EPBX 10000 UNIT/ML IJ SOLN
INTRAMUSCULAR | Status: AC
  Administered 2019-09-13: 13:00:00 20000 [IU] via SUBCUTANEOUS
  Filled 2019-09-13: qty 2

## 2019-09-14 ENCOUNTER — Other Ambulatory Visit: Payer: Self-pay | Admitting: *Deleted

## 2019-09-14 DIAGNOSIS — Z79899 Other long term (current) drug therapy: Secondary | ICD-10-CM | POA: Diagnosis not present

## 2019-09-14 DIAGNOSIS — E039 Hypothyroidism, unspecified: Secondary | ICD-10-CM | POA: Diagnosis not present

## 2019-09-14 DIAGNOSIS — N185 Chronic kidney disease, stage 5: Secondary | ICD-10-CM | POA: Diagnosis not present

## 2019-09-14 DIAGNOSIS — D539 Nutritional anemia, unspecified: Secondary | ICD-10-CM | POA: Diagnosis not present

## 2019-09-14 DIAGNOSIS — H9190 Unspecified hearing loss, unspecified ear: Secondary | ICD-10-CM | POA: Diagnosis not present

## 2019-09-14 DIAGNOSIS — Z86718 Personal history of other venous thrombosis and embolism: Secondary | ICD-10-CM | POA: Diagnosis not present

## 2019-09-14 DIAGNOSIS — D631 Anemia in chronic kidney disease: Secondary | ICD-10-CM | POA: Diagnosis not present

## 2019-09-14 DIAGNOSIS — Z992 Dependence on renal dialysis: Secondary | ICD-10-CM | POA: Diagnosis not present

## 2019-09-14 DIAGNOSIS — I5031 Acute diastolic (congestive) heart failure: Secondary | ICD-10-CM | POA: Diagnosis not present

## 2019-09-14 DIAGNOSIS — E876 Hypokalemia: Secondary | ICD-10-CM | POA: Diagnosis not present

## 2019-09-14 DIAGNOSIS — Z9049 Acquired absence of other specified parts of digestive tract: Secondary | ICD-10-CM | POA: Diagnosis not present

## 2019-09-14 DIAGNOSIS — R7989 Other specified abnormal findings of blood chemistry: Secondary | ICD-10-CM | POA: Diagnosis not present

## 2019-09-14 DIAGNOSIS — I132 Hypertensive heart and chronic kidney disease with heart failure and with stage 5 chronic kidney disease, or end stage renal disease: Secondary | ICD-10-CM | POA: Diagnosis not present

## 2019-09-14 NOTE — Patient Outreach (Signed)
[facility]) Care Management  09/14/2019  Yvonne Powell 11/18/1928 [id number]   EMMI-GENERAL DISCHARGE-RESOLVED RED ON EMMI ALERT Day #1 Date: 09/13/2019 Red Alert Reason: NO FOLLOW UP APPOINTMENT  OUTREACH #1 RN spoke with pt today concerning the above emmi. Pt states since that time she has made appointments with her primary provider Dr. Fara Olden on 5/14 and with Dr. Oval Linsey on 5/21 with the PA (CAD). No further needs all emmi resolved.  Plan: Case will be closed.  Raina Mina, RN Care Management Coordinator [place] Office [phone]

## 2019-09-15 DIAGNOSIS — N185 Chronic kidney disease, stage 5: Secondary | ICD-10-CM | POA: Diagnosis not present

## 2019-09-16 DIAGNOSIS — R7989 Other specified abnormal findings of blood chemistry: Secondary | ICD-10-CM | POA: Diagnosis not present

## 2019-09-16 DIAGNOSIS — N179 Acute kidney failure, unspecified: Secondary | ICD-10-CM | POA: Diagnosis not present

## 2019-09-16 DIAGNOSIS — I5031 Acute diastolic (congestive) heart failure: Secondary | ICD-10-CM | POA: Diagnosis not present

## 2019-09-16 DIAGNOSIS — N185 Chronic kidney disease, stage 5: Secondary | ICD-10-CM | POA: Diagnosis not present

## 2019-09-16 DIAGNOSIS — N281 Cyst of kidney, acquired: Secondary | ICD-10-CM | POA: Diagnosis not present

## 2019-09-16 DIAGNOSIS — J9601 Acute respiratory failure with hypoxia: Secondary | ICD-10-CM | POA: Diagnosis not present

## 2019-09-21 DIAGNOSIS — I5031 Acute diastolic (congestive) heart failure: Secondary | ICD-10-CM | POA: Diagnosis not present

## 2019-09-21 DIAGNOSIS — R7989 Other specified abnormal findings of blood chemistry: Secondary | ICD-10-CM | POA: Diagnosis not present

## 2019-09-21 DIAGNOSIS — E039 Hypothyroidism, unspecified: Secondary | ICD-10-CM | POA: Diagnosis not present

## 2019-09-21 DIAGNOSIS — D631 Anemia in chronic kidney disease: Secondary | ICD-10-CM | POA: Diagnosis not present

## 2019-09-21 DIAGNOSIS — N185 Chronic kidney disease, stage 5: Secondary | ICD-10-CM | POA: Diagnosis not present

## 2019-09-21 DIAGNOSIS — I132 Hypertensive heart and chronic kidney disease with heart failure and with stage 5 chronic kidney disease, or end stage renal disease: Secondary | ICD-10-CM | POA: Diagnosis not present

## 2019-09-21 NOTE — Progress Notes (Signed)
Cardiology Clinic Note   Patient Name: Yvonne Powell Date of Encounter: 09/23/2019  Primary Care Provider:  System, Pcp Not In Primary Cardiologist:  Skeet Latch, MD  Patient Profile    Yvonne Powell 86 year old female presents today for a post hospital follow-up of her acute on chronic diastolic heart failure, HTN, and demand ischemia.  Past Medical History    Past Medical History:  Diagnosis Date  . Anemia    due to kidney  . DVT (deep venous thrombosis) (HCC)    hx  . Gallstones 12/2018  . Hypertension   . Renal disorder   . Thyroid disease    graves dx   Past Surgical History:  Procedure Laterality Date  . AV FISTULA PLACEMENT Right 04/07/2017   Procedure: ARTERIOVENOUS (AV) FISTULA CREATION;  Surgeon: Elam Dutch, MD;  Location: [place];  Service: Vascular;  Laterality: Right;  . CHOLECYSTECTOMY N/A 12/23/2018   Procedure: LAPAROSCOPIC CHOLECYSTECTOMY;  Surgeon: Erroll Luna, MD;  Location: [place];  Service: General;  Laterality: N/A;  . COLONOSCOPY    . DILATION AND CURETTAGE OF UTERUS    . EYE SURGERY     cactaracts  . LIPOMA EXCISION    . LUMBAR EPIDURAL INJECTION  2018    Allergies    History of Present Illness    Yvonne Powell has a PMH of HTN, CKD 5, hypothyroidism, prior DVT, and chronic anemia.  She was admitted to the hospital with acute on chronic shortness of breath and lower extremity edema.  She has chronic kidney disease and had a fistula placed 12/18.  Her echocardiogram 09/08/2019 showed an ejection fraction of 50-55%, G1 DD, moderately dilated left atria, mild aortic sclerosis with no stenosis, and small pericardial effusion.  She was admitted to the hospital on 09/08/2019-09/11/2019.  She presents to the clinic today for follow-up evaluation states she is back to her normal daily activities.  However she is fairly sedentary due to knee pain.  She states she follows a low-sodium and renal diet.  She has noticed that her blood pressure has  been slightly elevated at home in the 140s over 60s.  She has been weighing herself daily but states that she weighs herself before she urinates.  I have educated about getting weight after urination.  I will increase her diltiazem to 360 mg daily, give her the salty 6 information sheet, give her blood pressure log and have her return in 1 month.  Today she denies chest pain, shortness of breath, lower extremity edema, fatigue, palpitations, melena, hematuria, hemoptysis, diaphoresis, weakness, presyncope, syncope, orthopnea, and PND.   Home Medications    Prior to Admission medications   Medication Sig Start Date End Date Taking? Authorizing Provider  calcitRIOL (ROCALTROL) 0.25 MCG capsule Take 0.25 mcg by mouth 2 (two) times a week. Monday, Friday    [provider]  Cholecalciferol (VITAMIN D) 2000 units tablet Take 2,000 Units by mouth daily.    [provider]  diltiazem (TIAZAC) 300 MG 24 hr capsule Take 300 mg by mouth daily.    [provider]  docusate sodium (COLACE) 100 MG capsule Take 100 mg by mouth daily as needed for moderate constipation.     [provider]  doxazosin (CARDURA) 8 MG tablet Take 8 mg by mouth daily.    [provider]  furosemide (LASIX) 40 MG tablet Take 1 tablet (40 mg total) by mouth daily. 09/12/19 10/12/19  Antonieta Pert, MD  Iron-FA-DSS-B Cmplx-Vit C (NEPHRON FA PO)  Take 1 tablet by mouth daily.    [provider]  levothyroxine (SYNTHROID, LEVOTHROID) 88 MCG tablet Take 88 mcg by mouth daily before breakfast.    [provider]  LOKELMA 10 g PACK packet Take 1 packet by mouth every other day.  08/26/19   [provider]  OVER THE COUNTER MEDICATION Take 2.5 mLs by mouth daily. Sodium Bicarb powder in 4 oz of water    [provider]  Polyethyl Glycol-Propyl Glycol (SYSTANE) 0.4-0.3 % SOLN Place 1 drop into both eyes daily as needed (Dry eyes).    [provider]    triamcinolone (NASACORT ALLERGY 24HR) 55 MCG/ACT AERO nasal inhaler Place 2 sprays into the nose daily as needed (Allergies).     [provider]    Family History    Family History  Problem Relation Age of Onset  . Congestive Heart Failure Maternal Grandmother 76  . Hypertension Maternal Grandmother    She indicated that her maternal grandmother is deceased.  Social History    Social History   Socioeconomic History  . Marital status: Divorced    Spouse name: Not on file  . Number of children: Not on file  . Years of education: Not on file  . Highest education level: Not on file  Occupational History  . Not on file  Tobacco Use  . Smoking status: Never Smoker  . Smokeless tobacco: Never Used  Substance and Sexual Activity  . Alcohol use: No  . Drug use: No  . Sexual activity: Not on file  Other Topics Concern  . Not on file  Social History Narrative  . Not on file   Social Determinants of Health   Financial Resource Strain:   . Difficulty of Paying Living Expenses:   Food Insecurity:   . Worried About Charity fundraiser in the Last Year:   . Arboriculturist in the Last Year:   Transportation Needs:   . Film/video editor (Medical):   Marland Kitchen Lack of Transportation (Non-Medical):   Physical Activity:   . Days of Exercise per Week:   . Minutes of Exercise per Session:   Stress:   . Feeling of Stress :   Social Connections:   . Frequency of Communication with Friends and Family:   . Frequency of Social Gatherings with Friends and Family:   . Attends Religious Services:   . Active Member of Clubs or Organizations:   . Attends Archivist Meetings:   Marland Kitchen Marital Status:   Intimate Partner Violence:   . Fear of Current or Ex-Partner:   . Emotionally Abused:   Marland Kitchen Physically Abused:   . Sexually Abused:      Review of Systems    General:  No chills, fever, night sweats or weight changes.  Cardiovascular:  No chest pain, dyspnea on exertion,  edema, orthopnea, palpitations, paroxysmal nocturnal dyspnea. Dermatological: No rash, lesions/masses Respiratory: No cough, dyspnea Urologic: No hematuria, dysuria Abdominal:   No nausea, vomiting, diarrhea, bright red blood per rectum rectum, melena, or hematemesis Neurologic:  No visual changes, wkns, changes in mental status. All other systems reviewed and are otherwise negative except as noted above.  Physical Exam    VS:  BP (!) 145/60   Pulse 80   Temp 98 F (36.7 C)   Wt 114 lb 9.6 oz (52 kg)   SpO2 99%   BMI 20.30 kg/m  , BMI Body mass index is 20.3 kg/m. GEN: Well nourished,  well developed, in no acute distress. HEENT: normal. Neck: Supple, no JVD, carotid bruits, or masses. Cardiac: RRR, no murmurs, rubs, or gallops. No clubbing, cyanosis, edema.  Radials/DP/PT 2+ and equal bilaterally.  Respiratory:  Respirations regular and unlabored, clear to auscultation bilaterally. GI: Soft, nontender, nondistended, BS + x 4. MS: no deformity or atrophy. Skin: warm and dry, no rash. Neuro:  Strength and sensation are intact. Psych: Normal affect.  Accessory Clinical Findings    ECG personally reviewed by me today- none today.  Echocardiogram 09/08/2019 1. Left ventricular ejection fraction, by estimation, is 50 to 55%. The  left ventricle has low normal function. Septal-lateral dyssynchrony  consistent with LBBB. There is moderate left ventricular hypertrophy. Left ventricular diastolic parameters are consistent with Grade I diastolic dysfunction (impaired relaxation).  2. Right ventricular systolic function is normal. The right ventricular  size is normal. Tricuspid regurgitation signal is inadequate for assessing PA pressure.  3. Left atrial size was moderately dilated.  4. The mitral valve is degenerative. Mild mitral valve regurgitation. No  evidence of mitral stenosis.  5. The aortic valve is tricuspid. Aortic valve regurgitation is not  visualized. Mild aortic  valve sclerosis is present, with no evidence of  aortic valve stenosis.  6. The inferior vena cava is dilated in size with <50% respiratory variability, suggesting right atrial pressure of 15 mmHg.  7. Small circumferential pericardial effusion, no evidence for tamponade. I do not think that the dilated IVC is related to the pericardial effusion.   Assessment & Plan   1.  Acute on chronic diastolic CHF-echocardiogram showed LVEF 50-55%, G1 DD and septal lateral dyssynchrony.  She received IV diuresis and was discharged at a weight of 113 pounds. Continue Lasix Heart healthy low-sodium diet-salty 6 given Daily weights Fluid restriction Elevate extremities when not active  Essential hypertension-BP today 145/60.  Similar control at home.   Increased diltiazem to 360 Continue doxazosin Heart healthy low-sodium diet-salty 6 given Increase physical activity as tolerated  Demand ischemia-no chest pain today.  On admission troponins mildly elevated to 126.  No plans were made for ischemic evaluation.  Normal systolic function shown on 09/08/2019 echocardiogram. Continue to monitor  Acute hypoxic respiratory failure due to CHF-on room air today.  No increased dyspnea.  On HD Heart healthy low-sodium diet Daily weights  Acute kidney injury/stage V CKD-Baseline creatinine 3.6-3.7. Followed by nephrology  Disposition: Follow-up with me in 1 month   Guelda Batson M. Latorria Zeoli NP-C    09/23/2019, 11:24 AM [place] [address] Office [phone] Fax [phone]

## 2019-09-23 ENCOUNTER — Other Ambulatory Visit: Payer: Self-pay

## 2019-09-23 ENCOUNTER — Encounter: Payer: Self-pay | Admitting: General Practice

## 2019-09-23 ENCOUNTER — Ambulatory Visit (INDEPENDENT_AMBULATORY_CARE_PROVIDER_SITE_OTHER): Payer: Medicare Other | Admitting: General Practice

## 2019-09-23 VITALS — BP 145/60 | HR 80 | Temp 98.0°F | Wt 114.6 lb

## 2019-09-23 DIAGNOSIS — I10 Essential (primary) hypertension: Secondary | ICD-10-CM | POA: Diagnosis not present

## 2019-09-23 DIAGNOSIS — I5033 Acute on chronic diastolic (congestive) heart failure: Secondary | ICD-10-CM

## 2019-09-23 DIAGNOSIS — I248 Other forms of acute ischemic heart disease: Secondary | ICD-10-CM | POA: Diagnosis not present

## 2019-09-23 DIAGNOSIS — N179 Acute kidney failure, unspecified: Secondary | ICD-10-CM | POA: Diagnosis not present

## 2019-09-23 DIAGNOSIS — J9601 Acute respiratory failure with hypoxia: Secondary | ICD-10-CM | POA: Diagnosis not present

## 2019-09-23 MED ORDER — DILTIAZEM HCL ER BEADS 360 MG PO CP24: 360.0000 mg | ORAL_CAPSULE | Freq: Every day | ORAL | 6 refills | Status: DC

## 2019-09-23 NOTE — Patient Instructions (Signed)
Medication Instructions:  INCREASE DILTIAZEM 360MG  DAILY *If you need a refill on your cardiac medications before your next appointment, please call your pharmacy*  Special Instructions PLEAS TAKE AND LOG YOU BP DAILY-BRING LOG WITH YOU TO YOUR FOLLOW UP APPOINTMENT  PLEASE READ AND FOLLOW SALTY 6-ATTACHED  Follow-Up: Your next appointment:  1 month(s)  In Person with [name], FNP  At [facility], you and your health needs are our priority.  As part of our continuing mission to provide you with exceptional heart care, we have created designated Provider Care Teams.  These Care Teams include your primary Cardiologist (physician) and Advanced Practice Providers (APPs -  Physician Assistants and Nurse Practitioners) who all work together to provide you with the care you need, when you need it.  We recommend signing up for the patient portal called "MyChart".  Sign up information is provided on this After Visit Summary.  MyChart is used to connect with patients for Virtual Visits (Telemedicine).  Patients are able to view lab/test results, encounter notes, upcoming appointments, etc.  Non-urgent messages can be sent to your provider as well.   To learn more about what you can do with MyChart, go to [email or web address].

## 2019-09-27 ENCOUNTER — Ambulatory Visit (HOSPITAL_COMMUNITY)
Admission: RE | Admit: 2019-09-27 | Discharge: 2019-09-27 | Disposition: A | Discharge status: Home / Self Care | Payer: Medicare Other | Source: Ambulatory Visit | Attending: Nephrology | Admitting: Nephrology

## 2019-09-27 ENCOUNTER — Other Ambulatory Visit: Payer: Self-pay

## 2019-09-27 ENCOUNTER — Encounter (HOSPITAL_COMMUNITY): Payer: Medicare Other

## 2019-09-27 VITALS — BP 136/60 | HR 77 | Temp 96.5°F | Resp 20

## 2019-09-27 DIAGNOSIS — I5031 Acute diastolic (congestive) heart failure: Secondary | ICD-10-CM | POA: Diagnosis not present

## 2019-09-27 DIAGNOSIS — R7989 Other specified abnormal findings of blood chemistry: Secondary | ICD-10-CM | POA: Diagnosis not present

## 2019-09-27 DIAGNOSIS — E039 Hypothyroidism, unspecified: Secondary | ICD-10-CM | POA: Diagnosis not present

## 2019-09-27 DIAGNOSIS — N185 Chronic kidney disease, stage 5: Secondary | ICD-10-CM | POA: Diagnosis not present

## 2019-09-27 DIAGNOSIS — D631 Anemia in chronic kidney disease: Secondary | ICD-10-CM | POA: Diagnosis not present

## 2019-09-27 DIAGNOSIS — I132 Hypertensive heart and chronic kidney disease with heart failure and with stage 5 chronic kidney disease, or end stage renal disease: Secondary | ICD-10-CM | POA: Diagnosis not present

## 2019-09-27 LAB — POCT HEMOGLOBIN-HEMACUE: Hemoglobin: 10.9 g/dL — ABNORMAL LOW (ref 12.0–15.0)

## 2019-09-27 MED ORDER — EPOETIN ALFA-EPBX 10000 UNIT/ML IJ SOLN
INTRAMUSCULAR | Status: AC
  Administered 2019-09-27: 20000 [IU]
  Filled 2019-09-27: qty 2

## 2019-09-27 MED ORDER — EPOETIN ALFA-EPBX 10000 UNIT/ML IJ SOLN: 20000.0000 [IU] | INTRAMUSCULAR | Status: DC

## 2019-09-30 ENCOUNTER — Ambulatory Visit
Admission: RE | Admit: 2019-09-30 | Discharge: 2019-09-30 | Disposition: A | Discharge status: Home / Self Care | Payer: Medicare Other | Source: Ambulatory Visit | Attending: Internal Medicine | Admitting: Internal Medicine

## 2019-09-30 DIAGNOSIS — N2889 Other specified disorders of kidney and ureter: Secondary | ICD-10-CM | POA: Diagnosis not present

## 2019-09-30 DIAGNOSIS — N281 Cyst of kidney, acquired: Secondary | ICD-10-CM

## 2019-10-05 DIAGNOSIS — I770 Arteriovenous fistula, acquired: Secondary | ICD-10-CM | POA: Diagnosis not present

## 2019-10-05 DIAGNOSIS — I120 Hypertensive chronic kidney disease with stage 5 chronic kidney disease or end stage renal disease: Secondary | ICD-10-CM | POA: Diagnosis not present

## 2019-10-05 DIAGNOSIS — N185 Chronic kidney disease, stage 5: Secondary | ICD-10-CM | POA: Diagnosis not present

## 2019-10-05 DIAGNOSIS — E039 Hypothyroidism, unspecified: Secondary | ICD-10-CM | POA: Diagnosis not present

## 2019-10-05 DIAGNOSIS — N281 Cyst of kidney, acquired: Secondary | ICD-10-CM | POA: Diagnosis not present

## 2019-10-05 DIAGNOSIS — I132 Hypertensive heart and chronic kidney disease with heart failure and with stage 5 chronic kidney disease, or end stage renal disease: Secondary | ICD-10-CM | POA: Diagnosis not present

## 2019-10-05 DIAGNOSIS — I5031 Acute diastolic (congestive) heart failure: Secondary | ICD-10-CM | POA: Diagnosis not present

## 2019-10-05 DIAGNOSIS — R7989 Other specified abnormal findings of blood chemistry: Secondary | ICD-10-CM | POA: Diagnosis not present

## 2019-10-05 DIAGNOSIS — N189 Chronic kidney disease, unspecified: Secondary | ICD-10-CM | POA: Diagnosis not present

## 2019-10-05 DIAGNOSIS — D631 Anemia in chronic kidney disease: Secondary | ICD-10-CM | POA: Diagnosis not present

## 2019-10-05 DIAGNOSIS — N2581 Secondary hyperparathyroidism of renal origin: Secondary | ICD-10-CM | POA: Diagnosis not present

## 2019-10-07 DIAGNOSIS — N185 Chronic kidney disease, stage 5: Secondary | ICD-10-CM | POA: Diagnosis not present

## 2019-10-11 ENCOUNTER — Other Ambulatory Visit: Payer: Self-pay

## 2019-10-11 ENCOUNTER — Encounter (HOSPITAL_COMMUNITY)
Admission: RE | Admit: 2019-10-11 | Discharge: 2019-10-11 | Disposition: A | Discharge status: Home / Self Care | Payer: Medicare Other | Source: Ambulatory Visit | Attending: Nephrology | Admitting: Nephrology

## 2019-10-11 VITALS — BP 139/60 | HR 74 | Temp 96.6°F | Resp 20

## 2019-10-11 DIAGNOSIS — I132 Hypertensive heart and chronic kidney disease with heart failure and with stage 5 chronic kidney disease, or end stage renal disease: Secondary | ICD-10-CM | POA: Diagnosis not present

## 2019-10-11 DIAGNOSIS — D631 Anemia in chronic kidney disease: Secondary | ICD-10-CM | POA: Diagnosis not present

## 2019-10-11 DIAGNOSIS — N185 Chronic kidney disease, stage 5: Secondary | ICD-10-CM | POA: Insufficient documentation

## 2019-10-11 DIAGNOSIS — E039 Hypothyroidism, unspecified: Secondary | ICD-10-CM | POA: Diagnosis not present

## 2019-10-11 DIAGNOSIS — R7989 Other specified abnormal findings of blood chemistry: Secondary | ICD-10-CM | POA: Diagnosis not present

## 2019-10-11 DIAGNOSIS — I5031 Acute diastolic (congestive) heart failure: Secondary | ICD-10-CM | POA: Diagnosis not present

## 2019-10-11 LAB — IRON AND TIBC
Iron: 40 ug/dL (ref 28–170)
Saturation Ratios: 16 % (ref 10.4–31.8)
TIBC: 251 ug/dL (ref 250–450)
UIBC: 211 ug/dL

## 2019-10-11 LAB — FERRITIN: Ferritin: 279 ng/mL (ref 11–307)

## 2019-10-11 LAB — POCT HEMOGLOBIN-HEMACUE: Hemoglobin: 11.2 g/dL — ABNORMAL LOW (ref 12.0–15.0)

## 2019-10-11 MED ORDER — EPOETIN ALFA-EPBX 10000 UNIT/ML IJ SOLN
INTRAMUSCULAR | Status: AC
  Filled 2019-10-11: qty 2

## 2019-10-11 MED ORDER — EPOETIN ALFA-EPBX 10000 UNIT/ML IJ SOLN
20000.0000 [IU] | INTRAMUSCULAR | Status: DC
  Administered 2019-10-11: 20000 [IU] via SUBCUTANEOUS

## 2019-10-14 DIAGNOSIS — D539 Nutritional anemia, unspecified: Secondary | ICD-10-CM | POA: Diagnosis not present

## 2019-10-14 DIAGNOSIS — Z992 Dependence on renal dialysis: Secondary | ICD-10-CM | POA: Diagnosis not present

## 2019-10-14 DIAGNOSIS — I132 Hypertensive heart and chronic kidney disease with heart failure and with stage 5 chronic kidney disease, or end stage renal disease: Secondary | ICD-10-CM | POA: Diagnosis not present

## 2019-10-14 DIAGNOSIS — E039 Hypothyroidism, unspecified: Secondary | ICD-10-CM | POA: Diagnosis not present

## 2019-10-14 DIAGNOSIS — D631 Anemia in chronic kidney disease: Secondary | ICD-10-CM | POA: Diagnosis not present

## 2019-10-14 DIAGNOSIS — E876 Hypokalemia: Secondary | ICD-10-CM | POA: Diagnosis not present

## 2019-10-14 DIAGNOSIS — I5031 Acute diastolic (congestive) heart failure: Secondary | ICD-10-CM | POA: Diagnosis not present

## 2019-10-14 DIAGNOSIS — Z9049 Acquired absence of other specified parts of digestive tract: Secondary | ICD-10-CM | POA: Diagnosis not present

## 2019-10-14 DIAGNOSIS — Z79899 Other long term (current) drug therapy: Secondary | ICD-10-CM | POA: Diagnosis not present

## 2019-10-14 DIAGNOSIS — H9190 Unspecified hearing loss, unspecified ear: Secondary | ICD-10-CM | POA: Diagnosis not present

## 2019-10-14 DIAGNOSIS — Z86718 Personal history of other venous thrombosis and embolism: Secondary | ICD-10-CM | POA: Diagnosis not present

## 2019-10-14 DIAGNOSIS — R7989 Other specified abnormal findings of blood chemistry: Secondary | ICD-10-CM | POA: Diagnosis not present

## 2019-10-14 DIAGNOSIS — N185 Chronic kidney disease, stage 5: Secondary | ICD-10-CM | POA: Diagnosis not present

## 2019-10-17 DIAGNOSIS — D509 Iron deficiency anemia, unspecified: Secondary | ICD-10-CM | POA: Insufficient documentation

## 2019-10-17 DIAGNOSIS — Z9229 Personal history of other drug therapy: Secondary | ICD-10-CM | POA: Insufficient documentation

## 2019-10-17 DIAGNOSIS — I129 Hypertensive chronic kidney disease with stage 1 through stage 4 chronic kidney disease, or unspecified chronic kidney disease: Secondary | ICD-10-CM | POA: Insufficient documentation

## 2019-10-17 DIAGNOSIS — K649 Unspecified hemorrhoids: Secondary | ICD-10-CM | POA: Insufficient documentation

## 2019-10-17 DIAGNOSIS — R0602 Shortness of breath: Secondary | ICD-10-CM | POA: Insufficient documentation

## 2019-10-17 DIAGNOSIS — T7840XA Allergy, unspecified, initial encounter: Secondary | ICD-10-CM | POA: Insufficient documentation

## 2019-10-17 DIAGNOSIS — I770 Arteriovenous fistula, acquired: Secondary | ICD-10-CM | POA: Insufficient documentation

## 2019-10-17 DIAGNOSIS — M1612 Unilateral primary osteoarthritis, left hip: Secondary | ICD-10-CM | POA: Insufficient documentation

## 2019-10-17 DIAGNOSIS — Z8719 Personal history of other diseases of the digestive system: Secondary | ICD-10-CM | POA: Insufficient documentation

## 2019-10-17 DIAGNOSIS — D689 Coagulation defect, unspecified: Secondary | ICD-10-CM | POA: Insufficient documentation

## 2019-10-17 DIAGNOSIS — N2581 Secondary hyperparathyroidism of renal origin: Secondary | ICD-10-CM | POA: Insufficient documentation

## 2019-10-17 DIAGNOSIS — M4716 Other spondylosis with myelopathy, lumbar region: Secondary | ICD-10-CM | POA: Insufficient documentation

## 2019-10-20 DIAGNOSIS — I5031 Acute diastolic (congestive) heart failure: Secondary | ICD-10-CM | POA: Diagnosis not present

## 2019-10-20 DIAGNOSIS — I132 Hypertensive heart and chronic kidney disease with heart failure and with stage 5 chronic kidney disease, or end stage renal disease: Secondary | ICD-10-CM | POA: Diagnosis not present

## 2019-10-20 DIAGNOSIS — N185 Chronic kidney disease, stage 5: Secondary | ICD-10-CM | POA: Diagnosis not present

## 2019-10-20 DIAGNOSIS — E039 Hypothyroidism, unspecified: Secondary | ICD-10-CM | POA: Diagnosis not present

## 2019-10-20 DIAGNOSIS — R7989 Other specified abnormal findings of blood chemistry: Secondary | ICD-10-CM | POA: Diagnosis not present

## 2019-10-20 DIAGNOSIS — D631 Anemia in chronic kidney disease: Secondary | ICD-10-CM | POA: Diagnosis not present

## 2019-10-25 ENCOUNTER — Encounter (HOSPITAL_COMMUNITY): Payer: Medicare Other

## 2019-10-25 NOTE — Progress Notes (Signed)
Cardiology Clinic Note   Patient Name: Yvonne Powell Date of Encounter: 10/26/2019  Primary Care Provider:  System, Pcp Not In Primary Cardiologist:  Skeet Latch, MD  Patient Profile    Yvonne Powell 86 year old female presents today for a  follow-up of her acute on chronic diastolic heart failure, HTN, and demand ischemia.  Past Medical History    Past Medical History:  Diagnosis Date  . Anemia    due to kidney  . DVT (deep venous thrombosis) (HCC)    hx  . Gallstones 12/2018  . Hypertension   . Renal disorder   . Thyroid disease    graves dx   Past Surgical History:  Procedure Laterality Date  . AV FISTULA PLACEMENT Right 04/07/2017   Procedure: ARTERIOVENOUS (AV) FISTULA CREATION;  Surgeon: Elam Dutch, MD;  Location: [place];  Service: Vascular;  Laterality: Right;  . CHOLECYSTECTOMY N/A 12/23/2018   Procedure: LAPAROSCOPIC CHOLECYSTECTOMY;  Surgeon: Erroll Luna, MD;  Location: [place];  Service: General;  Laterality: N/A;  . COLONOSCOPY    . DILATION AND CURETTAGE OF UTERUS    . EYE SURGERY     cactaracts  . LIPOMA EXCISION    . LUMBAR EPIDURAL INJECTION  2018    Allergies    History of Present Illness    [name] has a PMH of HTN, CKD 5, hypothyroidism, prior DVT, and chronic anemia.  She was admitted to the hospital with acute on chronic shortness of breath and lower extremity edema.  She has chronic kidney disease and had a fistula placed 12/18.  Her echocardiogram 09/08/2019 showed an ejection fraction of 50-55%, G1 DD, moderately dilated left atria, mild aortic sclerosis with no stenosis, and small pericardial effusion.  She was admitted to the hospital on 09/08/2019-09/11/2019.  She presented to the clinic 09/23/19 for follow-up evaluation stated she was back to her normal daily activities.  However she was fairly sedentary due to knee pain.  She stated she followed a low-sodium and renal diet.  She had noticed that her blood pressure had been  slightly elevated at home in the 140s over 60s.  She had been weighing herself daily but stated that she weighs herself before she urinates.  I  educated about getting weight after urination.  I will increase her diltiazem to 360 mg daily, give her the salty 6 information sheet, give her blood pressure log and have her return in 1 month.  She presents to the clinic today for follow-up evaluation and states she has noticed since starting dialysis that her blood pressure is much lower in the 100s to 110s over 60s. This makes her feel very fatigued and lethargic. She states that dialysis has been a major adjustment for her and her first day was not pleasant. I will decrease her diltiazem to 240 mg daily and have her follow-up in 1 month.  Today she denies chest pain, shortness of breath, lower extremity edema, fatigue, palpitations, melena, hematuria, hemoptysis, diaphoresis, weakness, presyncope, syncope, orthopnea, and PND.  Home Medications    Prior to Admission medications   Medication Sig Start Date End Date Taking? Authorizing Provider  calcitRIOL (ROCALTROL) 0.25 MCG capsule Take 0.25 mcg by mouth 2 (two) times a week. Monday, Friday    [provider]  Cholecalciferol (VITAMIN D) 2000 units tablet Take 2,000 Units by mouth daily.    [provider]  diltiazem (TIAZAC) 360 MG 24 hr capsule Take 1 capsule (360 mg total) by mouth daily. 09/23/19  Deberah Pelton, NP  docusate sodium (COLACE) 100 MG capsule Take 100 mg by mouth daily as needed for moderate constipation.     [provider]  doxazosin (CARDURA) 8 MG tablet Take 8 mg by mouth daily.    [provider]  furosemide (LASIX) 40 MG tablet Take 1 tablet (40 mg total) by mouth daily. 09/12/19 10/12/19  Antonieta Pert, MD  Iron-FA-DSS-B Cmplx-Vit C (NEPHRON FA PO) Take 1 tablet by mouth daily.    [provider]  levothyroxine (SYNTHROID, LEVOTHROID) 88 MCG tablet Take 88 mcg by mouth daily before  breakfast.    [provider]  LOKELMA 10 g PACK packet Take 1 packet by mouth every other day.  08/26/19   [provider]  OVER THE COUNTER MEDICATION Take 2.5 mLs by mouth daily. Sodium Bicarb powder in 4 oz of water    [provider]  Polyethyl Glycol-Propyl Glycol (SYSTANE) 0.4-0.3 % SOLN Place 1 drop into both eyes daily as needed (Dry eyes).    [provider]  triamcinolone (NASACORT ALLERGY 24HR) 55 MCG/ACT AERO nasal inhaler Place 2 sprays into the nose daily as needed (Allergies).     [provider]    Family History    Family History  Problem Relation Age of Onset  . Congestive Heart Failure Maternal Grandmother 76  . Hypertension Maternal Grandmother    She indicated that her maternal grandmother is deceased.  Social History    Social History   Socioeconomic History  . Marital status: Divorced    Spouse name: Not on file  . Number of children: Not on file  . Years of education: Not on file  . Highest education level: Not on file  Occupational History  . Not on file  Tobacco Use  . Smoking status: Never Smoker  . Smokeless tobacco: Never Used  Vaping Use  . Vaping Use: Never used  Substance and Sexual Activity  . Alcohol use: No  . Drug use: No  . Sexual activity: Not on file  Other Topics Concern  . Not on file  Social History Narrative  . Not on file   Social Determinants of Health   Financial Resource Strain:   . Difficulty of Paying Living Expenses:   Food Insecurity:   . Worried About Charity fundraiser in the Last Year:   . Arboriculturist in the Last Year:   Transportation Needs:   . Film/video editor (Medical):   Marland Kitchen Lack of Transportation (Non-Medical):   Physical Activity:   . Days of Exercise per Week:   . Minutes of Exercise per Session:   Stress:   . Feeling of Stress :   Social Connections:   . Frequency of Communication with Friends and Family:   . Frequency of Social Gatherings  with Friends and Family:   . Attends Religious Services:   . Active Member of Clubs or Organizations:   . Attends Archivist Meetings:   Marland Kitchen Marital Status:   Intimate Partner Violence:   . Fear of Current or Ex-Partner:   . Emotionally Abused:   Marland Kitchen Physically Abused:   . Sexually Abused:      Review of Systems    General:  No chills, fever, night sweats or weight changes.  Cardiovascular:  No chest pain, dyspnea on exertion, edema, orthopnea, palpitations, paroxysmal nocturnal dyspnea. Dermatological: No rash, lesions/masses Respiratory: No cough, dyspnea Urologic: No hematuria, dysuria Abdominal:   No nausea, vomiting, diarrhea,  bright red blood per rectum, melena, or hematemesis Neurologic:  No visual changes, wkns, changes in mental status. All other systems reviewed and are otherwise negative except as noted above.  Physical Exam    VS:  BP (!) 130/52 (BP Location: Left Arm, Patient Position: Sitting, Cuff Size: Normal)   Pulse 76   Ht 5\' 3"  (1.6 m)   Wt 111 lb 12.8 oz (50.7 kg)   SpO2 97%   BMI 19.80 kg/m  , BMI Body mass index is 19.8 kg/m. GEN: Well nourished, well developed, in no acute distress. HEENT: normal. Neck: Supple, no JVD, carotid bruits, or masses. Cardiac: RRR, no murmurs, rubs, or gallops. No clubbing, cyanosis, edema.  Radials/DP/PT 2+ and equal bilaterally.  Respiratory:  Respirations regular and unlabored, clear to auscultation bilaterally. GI: Soft, nontender, nondistended, BS + x 4. MS: no deformity or atrophy. Skin: warm and dry, no rash. Neuro:  Strength and sensation are intact. Psych: Normal affect.  Accessory Clinical Findings    ECG personally reviewed by me today-none today.  Echocardiogram 09/08/2019 1. Left ventricular ejection fraction, by estimation, is 50 to 55%. The  left ventricle has low normal function. Septal-lateral dyssynchrony  consistent with LBBB. There is moderate left ventricular hypertrophy. Left ventricular  diastolic parameters are consistent with Grade I diastolic dysfunction (impaired relaxation).  2. Right ventricular systolic function is normal. The right ventricular  size is normal. Tricuspid regurgitation signal is inadequate for assessing PA pressure.  3. Left atrial size was moderately dilated.  4. The mitral valve is degenerative. Mild mitral valve regurgitation. No  evidence of mitral stenosis.  5. The aortic valve is tricuspid. Aortic valve regurgitation is not  visualized. Mild aortic valve sclerosis is present, with no evidence of  aortic valve stenosis.  6. The inferior vena cava is dilated in size with <50% respiratory variability, suggesting right atrial pressure of 15 mmHg.  7. Small circumferential pericardial effusion, no evidence for tamponade. I do not think that the dilated IVC is related to the pericardial effusion.  Assessment & Plan   1.  Acute on chronic diastolic CHF-echocardiogram showed LVEF 50-55%, G1 DD and septal lateral dyssynchrony.  She received IV diuresis and was discharged at a weight of 113 pounds. Continue Lasix Heart healthy low-sodium diet-salty 6 given Daily weights Fluid restriction Elevate extremities when not active  Essential hypertension-BP today  130/52.  Similar control at home.   Decreased diltiazem to 240 Continue doxazosin Heart healthy low-sodium diet-salty 6 given Increase physical activity as tolerated  Demand ischemia-no chest pain today.  On admission troponins mildly elevated to 126.  No plans were made for ischemic evaluation.  Normal systolic function shown on 09/08/2019 echocardiogram. Continue to monitor  Acute hypoxic respiratory failure due to CHF-on room air today.  No increased dyspnea.  On HD Heart healthy low-sodium diet Daily weights  Acute kidney injury/stage V CKD-Baseline creatinine 3.6-3.7. Followed by nephrology  Disposition: Follow-up with Dr. Oval Linsey or me in 1 month  Jossie Ng. Arlyn Bumpus NP-C     10/26/2019, 11:08 AM [place] [address] Office [phone] Fax [phone]

## 2019-10-26 ENCOUNTER — Other Ambulatory Visit: Payer: Self-pay

## 2019-10-26 ENCOUNTER — Ambulatory Visit (INDEPENDENT_AMBULATORY_CARE_PROVIDER_SITE_OTHER): Payer: Medicare Other | Admitting: General Practice

## 2019-10-26 ENCOUNTER — Encounter: Payer: Self-pay | Admitting: General Practice

## 2019-10-26 VITALS — BP 130/52 | HR 76 | Ht 63.0 in | Wt 111.8 lb

## 2019-10-26 DIAGNOSIS — I10 Essential (primary) hypertension: Secondary | ICD-10-CM | POA: Diagnosis not present

## 2019-10-26 DIAGNOSIS — I248 Other forms of acute ischemic heart disease: Secondary | ICD-10-CM

## 2019-10-26 DIAGNOSIS — H9190 Unspecified hearing loss, unspecified ear: Secondary | ICD-10-CM | POA: Insufficient documentation

## 2019-10-26 DIAGNOSIS — I5033 Acute on chronic diastolic (congestive) heart failure: Secondary | ICD-10-CM | POA: Diagnosis not present

## 2019-10-26 DIAGNOSIS — J9601 Acute respiratory failure with hypoxia: Secondary | ICD-10-CM | POA: Diagnosis not present

## 2019-10-26 DIAGNOSIS — N179 Acute kidney failure, unspecified: Secondary | ICD-10-CM

## 2019-10-26 MED ORDER — DILTIAZEM HCL ER COATED BEADS 240 MG PO TB24: 240.0000 mg | ORAL_TABLET | Freq: Every day | ORAL | 6 refills | Status: DC

## 2019-10-26 NOTE — Patient Instructions (Signed)
Medication Instructions:  DECREASE DILTIAZEM 240MG  DAILY *If you need a refill on your cardiac medications before your next appointment, please call your pharmacy*  Follow-Up: Your next appointment:  10 month(s)  In Person with [name], FNP  At [facility], you and your health needs are our priority.  As part of our continuing mission to provide you with exceptional heart care, we have created designated Provider Care Teams.  These Care Teams include your primary Cardiologist (physician) and Advanced Practice Providers (APPs -  Physician Assistants and Nurse Practitioners) who all work together to provide you with the care you need, when you need it.  We recommend signing up for the patient portal called "MyChart".  Sign up information is provided on this After Visit Summary.  MyChart is used to connect with patients for Virtual Visits (Telemedicine).  Patients are able to view lab/test results, encounter notes, upcoming appointments, etc.  Non-urgent messages can be sent to your provider as well.   To learn more about what you can do with MyChart, go to [email or web address].

## 2019-10-28 DIAGNOSIS — E039 Hypothyroidism, unspecified: Secondary | ICD-10-CM | POA: Diagnosis not present

## 2019-10-28 DIAGNOSIS — I132 Hypertensive heart and chronic kidney disease with heart failure and with stage 5 chronic kidney disease, or end stage renal disease: Secondary | ICD-10-CM | POA: Diagnosis not present

## 2019-10-28 DIAGNOSIS — N185 Chronic kidney disease, stage 5: Secondary | ICD-10-CM | POA: Diagnosis not present

## 2019-10-28 DIAGNOSIS — R7989 Other specified abnormal findings of blood chemistry: Secondary | ICD-10-CM | POA: Diagnosis not present

## 2019-10-28 DIAGNOSIS — I5031 Acute diastolic (congestive) heart failure: Secondary | ICD-10-CM | POA: Diagnosis not present

## 2019-10-28 DIAGNOSIS — D631 Anemia in chronic kidney disease: Secondary | ICD-10-CM | POA: Diagnosis not present

## 2019-10-31 ENCOUNTER — Other Ambulatory Visit: Payer: Self-pay

## 2019-10-31 ENCOUNTER — Other Ambulatory Visit: Payer: Self-pay | Admitting: General Practice

## 2019-10-31 MED ORDER — VERAPAMIL HCL ER 180 MG PO TBCR: 180.0000 mg | EXTENDED_RELEASE_TABLET | Freq: Every day | ORAL | 3 refills | Status: DC

## 2019-10-31 NOTE — Telephone Encounter (Signed)
Follow Up:   Pharmacist said pt's insurance will no longer pay for Cardizem, but will pay for Verapamil. Is it alright to use the Verapamil?;

## 2019-10-31 NOTE — Telephone Encounter (Signed)
PER Cleaver, Jossie Ng, NP @ 2:22 PM:  Lets start her on verapamil 180 mg daily. Thank you.  NEW RX SENT   Pt notified that we canged her medication she will start ASAP

## 2019-10-31 NOTE — Telephone Encounter (Signed)
Would you like to change medication? Please advise

## 2019-11-03 DIAGNOSIS — N186 End stage renal disease: Secondary | ICD-10-CM | POA: Diagnosis not present

## 2019-11-03 DIAGNOSIS — I129 Hypertensive chronic kidney disease with stage 1 through stage 4 chronic kidney disease, or unspecified chronic kidney disease: Secondary | ICD-10-CM | POA: Diagnosis not present

## 2019-11-03 DIAGNOSIS — D631 Anemia in chronic kidney disease: Secondary | ICD-10-CM | POA: Diagnosis not present

## 2019-11-03 DIAGNOSIS — Z23 Encounter for immunization: Secondary | ICD-10-CM | POA: Diagnosis not present

## 2019-11-03 DIAGNOSIS — Z992 Dependence on renal dialysis: Secondary | ICD-10-CM | POA: Diagnosis not present

## 2019-11-03 DIAGNOSIS — N2581 Secondary hyperparathyroidism of renal origin: Secondary | ICD-10-CM | POA: Diagnosis not present

## 2019-11-05 DIAGNOSIS — Z23 Encounter for immunization: Secondary | ICD-10-CM | POA: Diagnosis not present

## 2019-11-05 DIAGNOSIS — D631 Anemia in chronic kidney disease: Secondary | ICD-10-CM | POA: Diagnosis not present

## 2019-11-05 DIAGNOSIS — Z992 Dependence on renal dialysis: Secondary | ICD-10-CM | POA: Diagnosis not present

## 2019-11-05 DIAGNOSIS — N186 End stage renal disease: Secondary | ICD-10-CM | POA: Diagnosis not present

## 2019-11-05 DIAGNOSIS — N2581 Secondary hyperparathyroidism of renal origin: Secondary | ICD-10-CM | POA: Diagnosis not present

## 2019-11-08 DIAGNOSIS — Z992 Dependence on renal dialysis: Secondary | ICD-10-CM | POA: Diagnosis not present

## 2019-11-08 DIAGNOSIS — N2581 Secondary hyperparathyroidism of renal origin: Secondary | ICD-10-CM | POA: Diagnosis not present

## 2019-11-08 DIAGNOSIS — Z23 Encounter for immunization: Secondary | ICD-10-CM | POA: Diagnosis not present

## 2019-11-08 DIAGNOSIS — D631 Anemia in chronic kidney disease: Secondary | ICD-10-CM | POA: Diagnosis not present

## 2019-11-08 DIAGNOSIS — N186 End stage renal disease: Secondary | ICD-10-CM | POA: Diagnosis not present

## 2019-11-10 DIAGNOSIS — Z23 Encounter for immunization: Secondary | ICD-10-CM | POA: Diagnosis not present

## 2019-11-10 DIAGNOSIS — N2581 Secondary hyperparathyroidism of renal origin: Secondary | ICD-10-CM | POA: Diagnosis not present

## 2019-11-10 DIAGNOSIS — D631 Anemia in chronic kidney disease: Secondary | ICD-10-CM | POA: Diagnosis not present

## 2019-11-10 DIAGNOSIS — Z992 Dependence on renal dialysis: Secondary | ICD-10-CM | POA: Diagnosis not present

## 2019-11-10 DIAGNOSIS — N186 End stage renal disease: Secondary | ICD-10-CM | POA: Diagnosis not present

## 2019-11-12 DIAGNOSIS — N186 End stage renal disease: Secondary | ICD-10-CM | POA: Diagnosis not present

## 2019-11-12 DIAGNOSIS — N2581 Secondary hyperparathyroidism of renal origin: Secondary | ICD-10-CM | POA: Diagnosis not present

## 2019-11-12 DIAGNOSIS — Z23 Encounter for immunization: Secondary | ICD-10-CM | POA: Diagnosis not present

## 2019-11-12 DIAGNOSIS — Z992 Dependence on renal dialysis: Secondary | ICD-10-CM | POA: Diagnosis not present

## 2019-11-12 DIAGNOSIS — D631 Anemia in chronic kidney disease: Secondary | ICD-10-CM | POA: Diagnosis not present

## 2019-11-15 DIAGNOSIS — Z23 Encounter for immunization: Secondary | ICD-10-CM | POA: Diagnosis not present

## 2019-11-15 DIAGNOSIS — N2581 Secondary hyperparathyroidism of renal origin: Secondary | ICD-10-CM | POA: Diagnosis not present

## 2019-11-15 DIAGNOSIS — D631 Anemia in chronic kidney disease: Secondary | ICD-10-CM | POA: Diagnosis not present

## 2019-11-15 DIAGNOSIS — Z992 Dependence on renal dialysis: Secondary | ICD-10-CM | POA: Diagnosis not present

## 2019-11-15 DIAGNOSIS — N186 End stage renal disease: Secondary | ICD-10-CM | POA: Diagnosis not present

## 2019-11-17 DIAGNOSIS — N2581 Secondary hyperparathyroidism of renal origin: Secondary | ICD-10-CM | POA: Diagnosis not present

## 2019-11-17 DIAGNOSIS — D631 Anemia in chronic kidney disease: Secondary | ICD-10-CM | POA: Diagnosis not present

## 2019-11-17 DIAGNOSIS — Z992 Dependence on renal dialysis: Secondary | ICD-10-CM | POA: Diagnosis not present

## 2019-11-17 DIAGNOSIS — Z23 Encounter for immunization: Secondary | ICD-10-CM | POA: Diagnosis not present

## 2019-11-17 DIAGNOSIS — N186 End stage renal disease: Secondary | ICD-10-CM | POA: Diagnosis not present

## 2019-11-19 DIAGNOSIS — N2581 Secondary hyperparathyroidism of renal origin: Secondary | ICD-10-CM | POA: Diagnosis not present

## 2019-11-19 DIAGNOSIS — N186 End stage renal disease: Secondary | ICD-10-CM | POA: Diagnosis not present

## 2019-11-19 DIAGNOSIS — D631 Anemia in chronic kidney disease: Secondary | ICD-10-CM | POA: Diagnosis not present

## 2019-11-19 DIAGNOSIS — Z23 Encounter for immunization: Secondary | ICD-10-CM | POA: Diagnosis not present

## 2019-11-19 DIAGNOSIS — Z992 Dependence on renal dialysis: Secondary | ICD-10-CM | POA: Diagnosis not present

## 2019-11-22 DIAGNOSIS — N2581 Secondary hyperparathyroidism of renal origin: Secondary | ICD-10-CM | POA: Diagnosis not present

## 2019-11-22 DIAGNOSIS — Z992 Dependence on renal dialysis: Secondary | ICD-10-CM | POA: Diagnosis not present

## 2019-11-22 DIAGNOSIS — D631 Anemia in chronic kidney disease: Secondary | ICD-10-CM | POA: Diagnosis not present

## 2019-11-22 DIAGNOSIS — Z23 Encounter for immunization: Secondary | ICD-10-CM | POA: Diagnosis not present

## 2019-11-22 DIAGNOSIS — N186 End stage renal disease: Secondary | ICD-10-CM | POA: Diagnosis not present

## 2019-11-24 DIAGNOSIS — Z992 Dependence on renal dialysis: Secondary | ICD-10-CM | POA: Diagnosis not present

## 2019-11-24 DIAGNOSIS — Z23 Encounter for immunization: Secondary | ICD-10-CM | POA: Diagnosis not present

## 2019-11-24 DIAGNOSIS — N186 End stage renal disease: Secondary | ICD-10-CM | POA: Diagnosis not present

## 2019-11-24 DIAGNOSIS — N2581 Secondary hyperparathyroidism of renal origin: Secondary | ICD-10-CM | POA: Diagnosis not present

## 2019-11-24 DIAGNOSIS — D631 Anemia in chronic kidney disease: Secondary | ICD-10-CM | POA: Diagnosis not present

## 2019-11-26 DIAGNOSIS — Z992 Dependence on renal dialysis: Secondary | ICD-10-CM | POA: Diagnosis not present

## 2019-11-26 DIAGNOSIS — D631 Anemia in chronic kidney disease: Secondary | ICD-10-CM | POA: Diagnosis not present

## 2019-11-26 DIAGNOSIS — N186 End stage renal disease: Secondary | ICD-10-CM | POA: Diagnosis not present

## 2019-11-26 DIAGNOSIS — Z23 Encounter for immunization: Secondary | ICD-10-CM | POA: Diagnosis not present

## 2019-11-26 DIAGNOSIS — N2581 Secondary hyperparathyroidism of renal origin: Secondary | ICD-10-CM | POA: Diagnosis not present

## 2019-11-29 DIAGNOSIS — D631 Anemia in chronic kidney disease: Secondary | ICD-10-CM | POA: Diagnosis not present

## 2019-11-29 DIAGNOSIS — N2581 Secondary hyperparathyroidism of renal origin: Secondary | ICD-10-CM | POA: Diagnosis not present

## 2019-11-29 DIAGNOSIS — Z992 Dependence on renal dialysis: Secondary | ICD-10-CM | POA: Diagnosis not present

## 2019-11-29 DIAGNOSIS — Z23 Encounter for immunization: Secondary | ICD-10-CM | POA: Diagnosis not present

## 2019-11-29 DIAGNOSIS — N186 End stage renal disease: Secondary | ICD-10-CM | POA: Diagnosis not present

## 2019-11-29 NOTE — Progress Notes (Signed)
Cardiology Clinic Note   Patient Name: Yvonne Powell Date of Encounter: 11/30/2019  Primary Care Provider:  System, Pcp Not In Primary Cardiologist:  Skeet Latch, MD  Patient Profile    Yvonne Powell 86 year old female presents today for a  follow-up of her acute on chronic diastolic heart failure, HTN, and demand ischemia.  Past Medical History    Past Medical History:  Diagnosis Date   Anemia    due to kidney   DVT (deep venous thrombosis) (HCC)    hx   Gallstones 12/2018   Hypertension    Renal disorder    Thyroid disease    graves dx   Past Surgical History:  Procedure Laterality Date   AV FISTULA PLACEMENT Right 04/07/2017   Procedure: ARTERIOVENOUS (AV) FISTULA CREATION;  Surgeon: Elam Dutch, MD;  Location: [place];  Service: Vascular;  Laterality: Right;   CHOLECYSTECTOMY N/A 12/23/2018   Procedure: LAPAROSCOPIC CHOLECYSTECTOMY;  Surgeon: Erroll Luna, MD;  Location: [place];  Service: General;  Laterality: N/A;   COLONOSCOPY     DILATION AND CURETTAGE OF UTERUS     EYE SURGERY     cactaracts   LIPOMA EXCISION     LUMBAR EPIDURAL INJECTION  2018    Allergies    History of Present Illness    Yvonne Powell has a PMH of HTN, CKD 5, hypothyroidism, prior DVT, and chronic anemia. She was admitted to the hospital with acute on chronic shortness of breath and lower extremity edema. She has chronic kidney disease and had a fistula placed 12/18. Her echocardiogram 09/08/2019 showed an ejection fraction of 50-55%, G1 DD, moderately dilated left atria, mild aortic sclerosis with no stenosis, and small pericardial effusion. She was admitted to the hospital on 09/08/2019-09/11/2019.  She presented to the clinic 09/23/19 for follow-up evaluation statedshe was back to her normal daily activities. However she was fairly sedentary due to knee pain. She stated she followed a low-sodium and renal diet. She had noticed that her blood pressure had been  slightly elevated at home in the 140s over 60s. She had been weighing herself daily but stated that she weighs herself before she urinates. I  educated about getting weight after urination. I will increase her diltiazem to 360 mg daily, give her the salty 6 information sheet, give her blood pressure log and have her return in 1 month.  She presented to the clinic 10/26/2019 for follow-up evaluation and stated she had noticed since starting dialysis that her blood pressure is much lower in the 100s to 110s over 60s. It made her feel very fatigued and lethargic. She stated that dialysis had been a major adjustment for her and her first day was not pleasant. I decreased her diltiazem to 240 mg daily and planned follow-up in 1 month.  She presents to the clinic today for follow-up evaluation and states she is doing fairly well.  She is adjusting to her Tuesday Thursday Saturday hemodialysis schedule.  She states that she has had to have 2 separate iron infusions.  Her first iron infusion was in IV gtt. and secondary infusion was run through her dialysis machine she states that she did not feel quite normal after her second iron infusion.  She states that her diltiazem was stopped and she was put on verapamil.  Her blood pressures have been well controlled in the 120s and 130s over 60s.  I will have her follow-up with Dr. Oval Linsey in 3 months.  Today shedenies chest pain, shortness  of breath, lower extremity edema, fatigue, palpitations, melena, hematuria, hemoptysis, diaphoresis, weakness, presyncope, syncope, orthopnea, and PND.  Home Medications    Prior to Admission medications   Medication Sig Start Date End Date Taking? Authorizing Provider  doxazosin (CARDURA) 8 MG tablet Take 8 mg by mouth daily.    [provider]  levothyroxine (SYNTHROID, LEVOTHROID) 88 MCG tablet Take 88 mcg by mouth daily before breakfast.    [provider]  verapamil (CALAN-SR) 180 MG CR tablet Take 1  tablet (180 mg total) by mouth at bedtime. 10/31/19   Deberah Pelton, NP    Family History    Family History  Problem Relation Age of Onset   Congestive Heart Failure Maternal Grandmother 52   Hypertension Maternal Grandmother    She indicated that her maternal grandmother is deceased.  Social History    Social History   Socioeconomic History   Marital status: Divorced    Spouse name: Not on file   Number of children: Not on file   Years of education: Not on file   Highest education level: Not on file  Occupational History   Not on file  Tobacco Use   Smoking status: Never Smoker   Smokeless tobacco: Never Used  Vaping Use   Vaping Use: Never used  Substance and Sexual Activity   Alcohol use: No   Drug use: No   Sexual activity: Not on file  Other Topics Concern   Not on file  Social History Narrative   Not on file   Social Determinants of Health   Financial Resource Strain:    Difficulty of Paying Living Expenses:   Food Insecurity:    Worried About Charity fundraiser in the Last Year:    Arboriculturist in the Last Year:   Transportation Needs:    Film/video editor (Medical):    Lack of Transportation (Non-Medical):   Physical Activity:    Days of Exercise per Week:    Minutes of Exercise per Session:   Stress:    Feeling of Stress :   Social Connections:    Frequency of Communication with Friends and Family:    Frequency of Social Gatherings with Friends and Family:    Attends Religious Services:    Active Member of Clubs or Organizations:    Attends Music therapist:    Marital Status:   Intimate Partner Violence:    Fear of Current or Ex-Partner:    Emotionally Abused:    Physically Abused:    Sexually Abused:      Review of Systems    General:  No chills, fever, night sweats or weight changes.  Cardiovascular:  No chest pain, dyspnea on exertion, edema, orthopnea, palpitations, paroxysmal  nocturnal dyspnea. Dermatological: No rash, lesions/masses Respiratory: No cough, dyspnea Urologic: No hematuria, dysuria Abdominal:   No nausea, vomiting, diarrhea, bright red blood per rectum, melena, or hematemesis Neurologic:  No visual changes, wkns, changes in mental status. All other systems reviewed and are otherwise negative except as noted above.  Physical Exam    VS:  BP (!) 152/60    Pulse (!) 106    Ht 5\' 3"  (1.6 m)    Wt 111 lb (50.3 kg)    SpO2 97%    BMI 19.66 kg/m  , BMI Body mass index is 19.66 kg/m. GEN: Well nourished, well developed, in no acute distress. HEENT: normal. Neck: Supple, no JVD, carotid bruits, or masses. Cardiac: RRR, no  murmurs, rubs, or gallops. No clubbing, cyanosis, edema.  Radials/DP/PT 2+ and equal bilaterally.  Respiratory:  Respirations regular and unlabored, clear to auscultation bilaterally. GI: Soft, nontender, nondistended, BS + x 4. MS: no deformity or atrophy. Skin: warm and dry, no rash. Neuro:  Strength and sensation are intact. Psych: Normal affect.  Accessory Clinical Findings    Recent Labs: 12/20/2018: ALT 15 12/21/2018: Magnesium 2.2 09/08/2019: B Natriuretic Peptide 1,601.8 09/11/2019: BUN 72; Creatinine, Ser 4.92; Platelets 188; Potassium 4.0; Sodium 144 10/11/2019: Hemoglobin 11.2   Recent Lipid Panel No results found for: CHOL, TRIG, HDL, CHOLHDL, VLDL, LDLCALC, LDLDIRECT  ECG personally reviewed by me today-none today.  Echocardiogram 09/08/2019 1. Left ventricular ejection fraction, by estimation, is 50 to 55%. The  left ventricle has low normal function. Septal-lateral dyssynchrony  consistent with LBBB. There is moderate left ventricular hypertrophy. Left ventricular diastolic parameters are consistent with Grade I diastolic dysfunction (impaired relaxation).  2. Right ventricular systolic function is normal. The right ventricular  size is normal. Tricuspid regurgitation signal is inadequate for assessing PA pressure.    3. Left atrial size was moderately dilated.  4. The mitral valve is degenerative. Mild mitral valve regurgitation. No  evidence of mitral stenosis.  5. The aortic valve is tricuspid. Aortic valve regurgitation is not  visualized. Mild aortic valve sclerosis is present, with no evidence of  aortic valve stenosis.  6. The inferior vena cava is dilated in size with <50% respiratory variability, suggesting right atrial pressure of 15 mmHg.  7. Small circumferential pericardial effusion, no evidence for tamponade. I do not think that the dilated IVC is related to the pericardial effusion.  Assessment & Plan   1.  Acute on chronic diastolic CHF-echocardiogram showed LVEF 50-55%, G1 DD and septal lateral dyssynchrony. She received IV diuresis and was discharged at a weight of 113 pounds. Lasix stopped due to HD Heart healthy low-sodium diet-salty 6 given Daily weights Fluid restriction Elevate extremities when not active  Essential hypertension-BP today  152/60.  Blood pressure well controlled120s-130s over 60s at home.   Continuedoxazosin Continue verapamil at bedtime Heart healthy low-sodium diet-salty 6 given Increase physical activity as tolerated  Demand ischemia-continues with no chest pain.  Normal systolic function shown on 09/08/2019 echocardiogram. Continue to monitor  Acute hypoxic respiratory failure due to CHF-on room air today. No increased dyspnea. On HD Heart healthy low-sodium diet Daily weights  Acute kidney injury/stage V CKD-Baseline creatinine 3.6-3.7. Continue HD Followed by nephrology  Disposition: Follow-up with Dr. Oval Linsey or me in 3 months.   Jossie Ng. Avonte Sensabaugh NP-C    11/30/2019, 2:17 PM [facility] [address] Office [phone] Fax [phone]

## 2019-11-30 ENCOUNTER — Other Ambulatory Visit: Payer: Self-pay

## 2019-11-30 ENCOUNTER — Encounter: Payer: Self-pay | Admitting: General Practice

## 2019-11-30 ENCOUNTER — Ambulatory Visit (INDEPENDENT_AMBULATORY_CARE_PROVIDER_SITE_OTHER): Payer: Medicare Other | Admitting: General Practice

## 2019-11-30 VITALS — BP 152/60 | HR 106 | Ht 63.0 in | Wt 111.0 lb

## 2019-11-30 DIAGNOSIS — I5033 Acute on chronic diastolic (congestive) heart failure: Secondary | ICD-10-CM

## 2019-11-30 DIAGNOSIS — J9601 Acute respiratory failure with hypoxia: Secondary | ICD-10-CM | POA: Diagnosis not present

## 2019-11-30 DIAGNOSIS — I10 Essential (primary) hypertension: Secondary | ICD-10-CM

## 2019-11-30 DIAGNOSIS — I248 Other forms of acute ischemic heart disease: Secondary | ICD-10-CM

## 2019-11-30 DIAGNOSIS — N179 Acute kidney failure, unspecified: Secondary | ICD-10-CM | POA: Diagnosis not present

## 2019-11-30 NOTE — Patient Instructions (Signed)
Medication Instructions:  The current medical regimen is effective;  continue present plan and medications as directed. Please refer to the Current Medication list given to you today. *If you need a refill on your cardiac medications before your next appointment, please call your pharmacy*  Special Instructions CONTINUE KIDNEY DIET  Follow-Up: Your next appointment:  3 month(s) In Person with Skeet Latch, MD  At [facility], you and your health needs are our priority.  As part of our continuing mission to provide you with exceptional heart care, we have created designated Provider Care Teams.  These Care Teams include your primary Cardiologist (physician) and Advanced Practice Providers (APPs -  Physician Assistants and Nurse Practitioners) who all work together to provide you with the care you need, when you need it.  We recommend signing up for the patient portal called "MyChart".  Sign up information is provided on this After Visit Summary.  MyChart is used to connect with patients for Virtual Visits (Telemedicine).  Patients are able to view lab/test results, encounter notes, upcoming appointments, etc.  Non-urgent messages can be sent to your provider as well.   To learn more about what you can do with MyChart, go to [email or web address].

## 2019-12-01 DIAGNOSIS — N186 End stage renal disease: Secondary | ICD-10-CM | POA: Diagnosis not present

## 2019-12-01 DIAGNOSIS — N2581 Secondary hyperparathyroidism of renal origin: Secondary | ICD-10-CM | POA: Diagnosis not present

## 2019-12-01 DIAGNOSIS — Z992 Dependence on renal dialysis: Secondary | ICD-10-CM | POA: Diagnosis not present

## 2019-12-01 DIAGNOSIS — D631 Anemia in chronic kidney disease: Secondary | ICD-10-CM | POA: Diagnosis not present

## 2019-12-01 DIAGNOSIS — Z23 Encounter for immunization: Secondary | ICD-10-CM | POA: Diagnosis not present

## 2019-12-03 DIAGNOSIS — Z992 Dependence on renal dialysis: Secondary | ICD-10-CM | POA: Diagnosis not present

## 2019-12-03 DIAGNOSIS — N2581 Secondary hyperparathyroidism of renal origin: Secondary | ICD-10-CM | POA: Diagnosis not present

## 2019-12-03 DIAGNOSIS — N186 End stage renal disease: Secondary | ICD-10-CM | POA: Diagnosis not present

## 2019-12-03 DIAGNOSIS — D631 Anemia in chronic kidney disease: Secondary | ICD-10-CM | POA: Diagnosis not present

## 2019-12-03 DIAGNOSIS — Z23 Encounter for immunization: Secondary | ICD-10-CM | POA: Diagnosis not present

## 2019-12-04 DIAGNOSIS — N186 End stage renal disease: Secondary | ICD-10-CM | POA: Diagnosis not present

## 2019-12-04 DIAGNOSIS — I129 Hypertensive chronic kidney disease with stage 1 through stage 4 chronic kidney disease, or unspecified chronic kidney disease: Secondary | ICD-10-CM | POA: Diagnosis not present

## 2019-12-04 DIAGNOSIS — Z992 Dependence on renal dialysis: Secondary | ICD-10-CM | POA: Diagnosis not present

## 2019-12-06 DIAGNOSIS — N2581 Secondary hyperparathyroidism of renal origin: Secondary | ICD-10-CM | POA: Diagnosis not present

## 2019-12-06 DIAGNOSIS — Z992 Dependence on renal dialysis: Secondary | ICD-10-CM | POA: Diagnosis not present

## 2019-12-06 DIAGNOSIS — N186 End stage renal disease: Secondary | ICD-10-CM | POA: Diagnosis not present

## 2019-12-06 DIAGNOSIS — D509 Iron deficiency anemia, unspecified: Secondary | ICD-10-CM | POA: Diagnosis not present

## 2019-12-06 DIAGNOSIS — Z23 Encounter for immunization: Secondary | ICD-10-CM | POA: Diagnosis not present

## 2019-12-06 DIAGNOSIS — D631 Anemia in chronic kidney disease: Secondary | ICD-10-CM | POA: Diagnosis not present

## 2019-12-08 DIAGNOSIS — Z992 Dependence on renal dialysis: Secondary | ICD-10-CM | POA: Diagnosis not present

## 2019-12-08 DIAGNOSIS — N2581 Secondary hyperparathyroidism of renal origin: Secondary | ICD-10-CM | POA: Diagnosis not present

## 2019-12-08 DIAGNOSIS — D631 Anemia in chronic kidney disease: Secondary | ICD-10-CM | POA: Diagnosis not present

## 2019-12-08 DIAGNOSIS — N186 End stage renal disease: Secondary | ICD-10-CM | POA: Diagnosis not present

## 2019-12-08 DIAGNOSIS — D509 Iron deficiency anemia, unspecified: Secondary | ICD-10-CM | POA: Diagnosis not present

## 2019-12-08 DIAGNOSIS — Z23 Encounter for immunization: Secondary | ICD-10-CM | POA: Diagnosis not present

## 2019-12-10 DIAGNOSIS — N2581 Secondary hyperparathyroidism of renal origin: Secondary | ICD-10-CM | POA: Diagnosis not present

## 2019-12-10 DIAGNOSIS — Z992 Dependence on renal dialysis: Secondary | ICD-10-CM | POA: Diagnosis not present

## 2019-12-10 DIAGNOSIS — N186 End stage renal disease: Secondary | ICD-10-CM | POA: Diagnosis not present

## 2019-12-10 DIAGNOSIS — D509 Iron deficiency anemia, unspecified: Secondary | ICD-10-CM | POA: Diagnosis not present

## 2019-12-10 DIAGNOSIS — Z23 Encounter for immunization: Secondary | ICD-10-CM | POA: Diagnosis not present

## 2019-12-10 DIAGNOSIS — D631 Anemia in chronic kidney disease: Secondary | ICD-10-CM | POA: Diagnosis not present

## 2019-12-13 DIAGNOSIS — Z23 Encounter for immunization: Secondary | ICD-10-CM | POA: Diagnosis not present

## 2019-12-13 DIAGNOSIS — D509 Iron deficiency anemia, unspecified: Secondary | ICD-10-CM | POA: Diagnosis not present

## 2019-12-13 DIAGNOSIS — N186 End stage renal disease: Secondary | ICD-10-CM | POA: Diagnosis not present

## 2019-12-13 DIAGNOSIS — N2581 Secondary hyperparathyroidism of renal origin: Secondary | ICD-10-CM | POA: Diagnosis not present

## 2019-12-13 DIAGNOSIS — Z992 Dependence on renal dialysis: Secondary | ICD-10-CM | POA: Diagnosis not present

## 2019-12-13 DIAGNOSIS — D631 Anemia in chronic kidney disease: Secondary | ICD-10-CM | POA: Diagnosis not present

## 2019-12-15 DIAGNOSIS — N2581 Secondary hyperparathyroidism of renal origin: Secondary | ICD-10-CM | POA: Diagnosis not present

## 2019-12-15 DIAGNOSIS — D631 Anemia in chronic kidney disease: Secondary | ICD-10-CM | POA: Diagnosis not present

## 2019-12-15 DIAGNOSIS — N186 End stage renal disease: Secondary | ICD-10-CM | POA: Diagnosis not present

## 2019-12-15 DIAGNOSIS — Z992 Dependence on renal dialysis: Secondary | ICD-10-CM | POA: Diagnosis not present

## 2019-12-15 DIAGNOSIS — D509 Iron deficiency anemia, unspecified: Secondary | ICD-10-CM | POA: Diagnosis not present

## 2019-12-15 DIAGNOSIS — Z23 Encounter for immunization: Secondary | ICD-10-CM | POA: Diagnosis not present

## 2019-12-17 DIAGNOSIS — N186 End stage renal disease: Secondary | ICD-10-CM | POA: Diagnosis not present

## 2019-12-17 DIAGNOSIS — Z992 Dependence on renal dialysis: Secondary | ICD-10-CM | POA: Diagnosis not present

## 2019-12-17 DIAGNOSIS — N2581 Secondary hyperparathyroidism of renal origin: Secondary | ICD-10-CM | POA: Diagnosis not present

## 2019-12-17 DIAGNOSIS — Z23 Encounter for immunization: Secondary | ICD-10-CM | POA: Diagnosis not present

## 2019-12-17 DIAGNOSIS — D631 Anemia in chronic kidney disease: Secondary | ICD-10-CM | POA: Diagnosis not present

## 2019-12-17 DIAGNOSIS — D509 Iron deficiency anemia, unspecified: Secondary | ICD-10-CM | POA: Diagnosis not present

## 2019-12-20 DIAGNOSIS — D631 Anemia in chronic kidney disease: Secondary | ICD-10-CM | POA: Diagnosis not present

## 2019-12-20 DIAGNOSIS — N2581 Secondary hyperparathyroidism of renal origin: Secondary | ICD-10-CM | POA: Diagnosis not present

## 2019-12-20 DIAGNOSIS — Z992 Dependence on renal dialysis: Secondary | ICD-10-CM | POA: Diagnosis not present

## 2019-12-20 DIAGNOSIS — N186 End stage renal disease: Secondary | ICD-10-CM | POA: Diagnosis not present

## 2019-12-20 DIAGNOSIS — D509 Iron deficiency anemia, unspecified: Secondary | ICD-10-CM | POA: Diagnosis not present

## 2019-12-20 DIAGNOSIS — Z23 Encounter for immunization: Secondary | ICD-10-CM | POA: Diagnosis not present

## 2019-12-22 DIAGNOSIS — Z992 Dependence on renal dialysis: Secondary | ICD-10-CM | POA: Diagnosis not present

## 2019-12-22 DIAGNOSIS — N2581 Secondary hyperparathyroidism of renal origin: Secondary | ICD-10-CM | POA: Diagnosis not present

## 2019-12-22 DIAGNOSIS — D509 Iron deficiency anemia, unspecified: Secondary | ICD-10-CM | POA: Diagnosis not present

## 2019-12-22 DIAGNOSIS — D631 Anemia in chronic kidney disease: Secondary | ICD-10-CM | POA: Diagnosis not present

## 2019-12-22 DIAGNOSIS — N186 End stage renal disease: Secondary | ICD-10-CM | POA: Diagnosis not present

## 2019-12-22 DIAGNOSIS — Z23 Encounter for immunization: Secondary | ICD-10-CM | POA: Diagnosis not present

## 2019-12-24 DIAGNOSIS — Z992 Dependence on renal dialysis: Secondary | ICD-10-CM | POA: Diagnosis not present

## 2019-12-24 DIAGNOSIS — N186 End stage renal disease: Secondary | ICD-10-CM | POA: Diagnosis not present

## 2019-12-24 DIAGNOSIS — D509 Iron deficiency anemia, unspecified: Secondary | ICD-10-CM | POA: Diagnosis not present

## 2019-12-24 DIAGNOSIS — D631 Anemia in chronic kidney disease: Secondary | ICD-10-CM | POA: Diagnosis not present

## 2019-12-24 DIAGNOSIS — Z23 Encounter for immunization: Secondary | ICD-10-CM | POA: Diagnosis not present

## 2019-12-24 DIAGNOSIS — N2581 Secondary hyperparathyroidism of renal origin: Secondary | ICD-10-CM | POA: Diagnosis not present

## 2019-12-27 DIAGNOSIS — N2581 Secondary hyperparathyroidism of renal origin: Secondary | ICD-10-CM | POA: Diagnosis not present

## 2019-12-27 DIAGNOSIS — Z23 Encounter for immunization: Secondary | ICD-10-CM | POA: Diagnosis not present

## 2019-12-27 DIAGNOSIS — D631 Anemia in chronic kidney disease: Secondary | ICD-10-CM | POA: Diagnosis not present

## 2019-12-27 DIAGNOSIS — Z992 Dependence on renal dialysis: Secondary | ICD-10-CM | POA: Diagnosis not present

## 2019-12-27 DIAGNOSIS — N186 End stage renal disease: Secondary | ICD-10-CM | POA: Diagnosis not present

## 2019-12-27 DIAGNOSIS — D509 Iron deficiency anemia, unspecified: Secondary | ICD-10-CM | POA: Diagnosis not present

## 2019-12-29 DIAGNOSIS — Z992 Dependence on renal dialysis: Secondary | ICD-10-CM | POA: Diagnosis not present

## 2019-12-29 DIAGNOSIS — D631 Anemia in chronic kidney disease: Secondary | ICD-10-CM | POA: Diagnosis not present

## 2019-12-29 DIAGNOSIS — D509 Iron deficiency anemia, unspecified: Secondary | ICD-10-CM | POA: Diagnosis not present

## 2019-12-29 DIAGNOSIS — Z23 Encounter for immunization: Secondary | ICD-10-CM | POA: Diagnosis not present

## 2019-12-29 DIAGNOSIS — N186 End stage renal disease: Secondary | ICD-10-CM | POA: Diagnosis not present

## 2019-12-29 DIAGNOSIS — N2581 Secondary hyperparathyroidism of renal origin: Secondary | ICD-10-CM | POA: Diagnosis not present

## 2019-12-31 DIAGNOSIS — N2581 Secondary hyperparathyroidism of renal origin: Secondary | ICD-10-CM | POA: Diagnosis not present

## 2019-12-31 DIAGNOSIS — N186 End stage renal disease: Secondary | ICD-10-CM | POA: Diagnosis not present

## 2019-12-31 DIAGNOSIS — D631 Anemia in chronic kidney disease: Secondary | ICD-10-CM | POA: Diagnosis not present

## 2019-12-31 DIAGNOSIS — Z23 Encounter for immunization: Secondary | ICD-10-CM | POA: Diagnosis not present

## 2019-12-31 DIAGNOSIS — Z992 Dependence on renal dialysis: Secondary | ICD-10-CM | POA: Diagnosis not present

## 2019-12-31 DIAGNOSIS — D509 Iron deficiency anemia, unspecified: Secondary | ICD-10-CM | POA: Diagnosis not present

## 2020-01-03 DIAGNOSIS — N186 End stage renal disease: Secondary | ICD-10-CM | POA: Diagnosis not present

## 2020-01-03 DIAGNOSIS — D509 Iron deficiency anemia, unspecified: Secondary | ICD-10-CM | POA: Diagnosis not present

## 2020-01-03 DIAGNOSIS — N2581 Secondary hyperparathyroidism of renal origin: Secondary | ICD-10-CM | POA: Diagnosis not present

## 2020-01-03 DIAGNOSIS — Z23 Encounter for immunization: Secondary | ICD-10-CM | POA: Diagnosis not present

## 2020-01-03 DIAGNOSIS — D631 Anemia in chronic kidney disease: Secondary | ICD-10-CM | POA: Diagnosis not present

## 2020-01-03 DIAGNOSIS — Z992 Dependence on renal dialysis: Secondary | ICD-10-CM | POA: Diagnosis not present

## 2020-01-04 DIAGNOSIS — Z992 Dependence on renal dialysis: Secondary | ICD-10-CM | POA: Diagnosis not present

## 2020-01-04 DIAGNOSIS — N186 End stage renal disease: Secondary | ICD-10-CM | POA: Diagnosis not present

## 2020-01-04 DIAGNOSIS — I129 Hypertensive chronic kidney disease with stage 1 through stage 4 chronic kidney disease, or unspecified chronic kidney disease: Secondary | ICD-10-CM | POA: Diagnosis not present

## 2020-01-05 DIAGNOSIS — N2581 Secondary hyperparathyroidism of renal origin: Secondary | ICD-10-CM | POA: Diagnosis not present

## 2020-01-05 DIAGNOSIS — D509 Iron deficiency anemia, unspecified: Secondary | ICD-10-CM | POA: Diagnosis not present

## 2020-01-05 DIAGNOSIS — N186 End stage renal disease: Secondary | ICD-10-CM | POA: Diagnosis not present

## 2020-01-05 DIAGNOSIS — D631 Anemia in chronic kidney disease: Secondary | ICD-10-CM | POA: Diagnosis not present

## 2020-01-05 DIAGNOSIS — Z23 Encounter for immunization: Secondary | ICD-10-CM | POA: Diagnosis not present

## 2020-01-05 DIAGNOSIS — Z992 Dependence on renal dialysis: Secondary | ICD-10-CM | POA: Diagnosis not present

## 2020-01-07 DIAGNOSIS — Z23 Encounter for immunization: Secondary | ICD-10-CM | POA: Diagnosis not present

## 2020-01-07 DIAGNOSIS — D509 Iron deficiency anemia, unspecified: Secondary | ICD-10-CM | POA: Diagnosis not present

## 2020-01-07 DIAGNOSIS — N2581 Secondary hyperparathyroidism of renal origin: Secondary | ICD-10-CM | POA: Diagnosis not present

## 2020-01-07 DIAGNOSIS — N186 End stage renal disease: Secondary | ICD-10-CM | POA: Diagnosis not present

## 2020-01-07 DIAGNOSIS — D631 Anemia in chronic kidney disease: Secondary | ICD-10-CM | POA: Diagnosis not present

## 2020-01-07 DIAGNOSIS — Z992 Dependence on renal dialysis: Secondary | ICD-10-CM | POA: Diagnosis not present

## 2020-01-10 DIAGNOSIS — N2581 Secondary hyperparathyroidism of renal origin: Secondary | ICD-10-CM | POA: Diagnosis not present

## 2020-01-10 DIAGNOSIS — D509 Iron deficiency anemia, unspecified: Secondary | ICD-10-CM | POA: Diagnosis not present

## 2020-01-10 DIAGNOSIS — Z23 Encounter for immunization: Secondary | ICD-10-CM | POA: Diagnosis not present

## 2020-01-10 DIAGNOSIS — Z992 Dependence on renal dialysis: Secondary | ICD-10-CM | POA: Diagnosis not present

## 2020-01-10 DIAGNOSIS — D631 Anemia in chronic kidney disease: Secondary | ICD-10-CM | POA: Diagnosis not present

## 2020-01-10 DIAGNOSIS — N186 End stage renal disease: Secondary | ICD-10-CM | POA: Diagnosis not present

## 2020-01-12 DIAGNOSIS — Z992 Dependence on renal dialysis: Secondary | ICD-10-CM | POA: Diagnosis not present

## 2020-01-12 DIAGNOSIS — D631 Anemia in chronic kidney disease: Secondary | ICD-10-CM | POA: Diagnosis not present

## 2020-01-12 DIAGNOSIS — N2581 Secondary hyperparathyroidism of renal origin: Secondary | ICD-10-CM | POA: Diagnosis not present

## 2020-01-12 DIAGNOSIS — D509 Iron deficiency anemia, unspecified: Secondary | ICD-10-CM | POA: Diagnosis not present

## 2020-01-12 DIAGNOSIS — N186 End stage renal disease: Secondary | ICD-10-CM | POA: Diagnosis not present

## 2020-01-12 DIAGNOSIS — Z23 Encounter for immunization: Secondary | ICD-10-CM | POA: Diagnosis not present

## 2020-01-14 DIAGNOSIS — N186 End stage renal disease: Secondary | ICD-10-CM | POA: Diagnosis not present

## 2020-01-14 DIAGNOSIS — Z992 Dependence on renal dialysis: Secondary | ICD-10-CM | POA: Diagnosis not present

## 2020-01-14 DIAGNOSIS — D631 Anemia in chronic kidney disease: Secondary | ICD-10-CM | POA: Diagnosis not present

## 2020-01-14 DIAGNOSIS — N2581 Secondary hyperparathyroidism of renal origin: Secondary | ICD-10-CM | POA: Diagnosis not present

## 2020-01-14 DIAGNOSIS — Z23 Encounter for immunization: Secondary | ICD-10-CM | POA: Diagnosis not present

## 2020-01-14 DIAGNOSIS — D509 Iron deficiency anemia, unspecified: Secondary | ICD-10-CM | POA: Diagnosis not present

## 2020-01-16 DIAGNOSIS — N281 Cyst of kidney, acquired: Secondary | ICD-10-CM | POA: Diagnosis not present

## 2020-01-16 DIAGNOSIS — D4102 Neoplasm of uncertain behavior of left kidney: Secondary | ICD-10-CM | POA: Diagnosis not present

## 2020-01-17 DIAGNOSIS — Z23 Encounter for immunization: Secondary | ICD-10-CM | POA: Diagnosis not present

## 2020-01-17 DIAGNOSIS — N2581 Secondary hyperparathyroidism of renal origin: Secondary | ICD-10-CM | POA: Diagnosis not present

## 2020-01-17 DIAGNOSIS — N186 End stage renal disease: Secondary | ICD-10-CM | POA: Diagnosis not present

## 2020-01-17 DIAGNOSIS — D631 Anemia in chronic kidney disease: Secondary | ICD-10-CM | POA: Diagnosis not present

## 2020-01-17 DIAGNOSIS — D509 Iron deficiency anemia, unspecified: Secondary | ICD-10-CM | POA: Diagnosis not present

## 2020-01-17 DIAGNOSIS — Z992 Dependence on renal dialysis: Secondary | ICD-10-CM | POA: Diagnosis not present

## 2020-01-19 DIAGNOSIS — D631 Anemia in chronic kidney disease: Secondary | ICD-10-CM | POA: Diagnosis not present

## 2020-01-19 DIAGNOSIS — D509 Iron deficiency anemia, unspecified: Secondary | ICD-10-CM | POA: Diagnosis not present

## 2020-01-19 DIAGNOSIS — N2581 Secondary hyperparathyroidism of renal origin: Secondary | ICD-10-CM | POA: Diagnosis not present

## 2020-01-19 DIAGNOSIS — N186 End stage renal disease: Secondary | ICD-10-CM | POA: Diagnosis not present

## 2020-01-19 DIAGNOSIS — Z992 Dependence on renal dialysis: Secondary | ICD-10-CM | POA: Diagnosis not present

## 2020-01-19 DIAGNOSIS — Z23 Encounter for immunization: Secondary | ICD-10-CM | POA: Diagnosis not present

## 2020-01-21 DIAGNOSIS — D631 Anemia in chronic kidney disease: Secondary | ICD-10-CM | POA: Diagnosis not present

## 2020-01-21 DIAGNOSIS — N186 End stage renal disease: Secondary | ICD-10-CM | POA: Diagnosis not present

## 2020-01-21 DIAGNOSIS — D509 Iron deficiency anemia, unspecified: Secondary | ICD-10-CM | POA: Diagnosis not present

## 2020-01-21 DIAGNOSIS — Z23 Encounter for immunization: Secondary | ICD-10-CM | POA: Diagnosis not present

## 2020-01-21 DIAGNOSIS — Z992 Dependence on renal dialysis: Secondary | ICD-10-CM | POA: Diagnosis not present

## 2020-01-21 DIAGNOSIS — N2581 Secondary hyperparathyroidism of renal origin: Secondary | ICD-10-CM | POA: Diagnosis not present

## 2020-01-24 DIAGNOSIS — Z23 Encounter for immunization: Secondary | ICD-10-CM | POA: Diagnosis not present

## 2020-01-24 DIAGNOSIS — D509 Iron deficiency anemia, unspecified: Secondary | ICD-10-CM | POA: Diagnosis not present

## 2020-01-24 DIAGNOSIS — Z992 Dependence on renal dialysis: Secondary | ICD-10-CM | POA: Diagnosis not present

## 2020-01-24 DIAGNOSIS — D631 Anemia in chronic kidney disease: Secondary | ICD-10-CM | POA: Diagnosis not present

## 2020-01-24 DIAGNOSIS — N186 End stage renal disease: Secondary | ICD-10-CM | POA: Diagnosis not present

## 2020-01-24 DIAGNOSIS — N2581 Secondary hyperparathyroidism of renal origin: Secondary | ICD-10-CM | POA: Diagnosis not present

## 2020-01-26 DIAGNOSIS — N186 End stage renal disease: Secondary | ICD-10-CM | POA: Diagnosis not present

## 2020-01-26 DIAGNOSIS — D631 Anemia in chronic kidney disease: Secondary | ICD-10-CM | POA: Diagnosis not present

## 2020-01-26 DIAGNOSIS — N2581 Secondary hyperparathyroidism of renal origin: Secondary | ICD-10-CM | POA: Diagnosis not present

## 2020-01-26 DIAGNOSIS — D509 Iron deficiency anemia, unspecified: Secondary | ICD-10-CM | POA: Diagnosis not present

## 2020-01-26 DIAGNOSIS — Z23 Encounter for immunization: Secondary | ICD-10-CM | POA: Diagnosis not present

## 2020-01-26 DIAGNOSIS — Z992 Dependence on renal dialysis: Secondary | ICD-10-CM | POA: Diagnosis not present

## 2020-01-28 DIAGNOSIS — Z992 Dependence on renal dialysis: Secondary | ICD-10-CM | POA: Diagnosis not present

## 2020-01-28 DIAGNOSIS — N186 End stage renal disease: Secondary | ICD-10-CM | POA: Diagnosis not present

## 2020-01-28 DIAGNOSIS — Z23 Encounter for immunization: Secondary | ICD-10-CM | POA: Diagnosis not present

## 2020-01-28 DIAGNOSIS — N2581 Secondary hyperparathyroidism of renal origin: Secondary | ICD-10-CM | POA: Diagnosis not present

## 2020-01-28 DIAGNOSIS — D631 Anemia in chronic kidney disease: Secondary | ICD-10-CM | POA: Diagnosis not present

## 2020-01-28 DIAGNOSIS — D509 Iron deficiency anemia, unspecified: Secondary | ICD-10-CM | POA: Diagnosis not present

## 2020-01-31 DIAGNOSIS — Z23 Encounter for immunization: Secondary | ICD-10-CM | POA: Diagnosis not present

## 2020-01-31 DIAGNOSIS — N2581 Secondary hyperparathyroidism of renal origin: Secondary | ICD-10-CM | POA: Diagnosis not present

## 2020-01-31 DIAGNOSIS — D509 Iron deficiency anemia, unspecified: Secondary | ICD-10-CM | POA: Diagnosis not present

## 2020-01-31 DIAGNOSIS — D631 Anemia in chronic kidney disease: Secondary | ICD-10-CM | POA: Diagnosis not present

## 2020-01-31 DIAGNOSIS — N186 End stage renal disease: Secondary | ICD-10-CM | POA: Diagnosis not present

## 2020-01-31 DIAGNOSIS — Z992 Dependence on renal dialysis: Secondary | ICD-10-CM | POA: Diagnosis not present

## 2020-02-02 DIAGNOSIS — N2581 Secondary hyperparathyroidism of renal origin: Secondary | ICD-10-CM | POA: Diagnosis not present

## 2020-02-02 DIAGNOSIS — Z992 Dependence on renal dialysis: Secondary | ICD-10-CM | POA: Diagnosis not present

## 2020-02-02 DIAGNOSIS — N186 End stage renal disease: Secondary | ICD-10-CM | POA: Diagnosis not present

## 2020-02-02 DIAGNOSIS — D631 Anemia in chronic kidney disease: Secondary | ICD-10-CM | POA: Diagnosis not present

## 2020-02-02 DIAGNOSIS — Z23 Encounter for immunization: Secondary | ICD-10-CM | POA: Diagnosis not present

## 2020-02-02 DIAGNOSIS — D509 Iron deficiency anemia, unspecified: Secondary | ICD-10-CM | POA: Diagnosis not present

## 2020-02-03 DIAGNOSIS — I129 Hypertensive chronic kidney disease with stage 1 through stage 4 chronic kidney disease, or unspecified chronic kidney disease: Secondary | ICD-10-CM | POA: Diagnosis not present

## 2020-02-03 DIAGNOSIS — Z992 Dependence on renal dialysis: Secondary | ICD-10-CM | POA: Diagnosis not present

## 2020-02-03 DIAGNOSIS — N186 End stage renal disease: Secondary | ICD-10-CM | POA: Diagnosis not present

## 2020-02-04 DIAGNOSIS — N186 End stage renal disease: Secondary | ICD-10-CM | POA: Diagnosis not present

## 2020-02-04 DIAGNOSIS — Z992 Dependence on renal dialysis: Secondary | ICD-10-CM | POA: Diagnosis not present

## 2020-02-04 DIAGNOSIS — D509 Iron deficiency anemia, unspecified: Secondary | ICD-10-CM | POA: Diagnosis not present

## 2020-02-04 DIAGNOSIS — N2581 Secondary hyperparathyroidism of renal origin: Secondary | ICD-10-CM | POA: Diagnosis not present

## 2020-02-04 DIAGNOSIS — Z23 Encounter for immunization: Secondary | ICD-10-CM | POA: Diagnosis not present

## 2020-02-07 DIAGNOSIS — N186 End stage renal disease: Secondary | ICD-10-CM | POA: Diagnosis not present

## 2020-02-07 DIAGNOSIS — Z23 Encounter for immunization: Secondary | ICD-10-CM | POA: Diagnosis not present

## 2020-02-07 DIAGNOSIS — Z992 Dependence on renal dialysis: Secondary | ICD-10-CM | POA: Diagnosis not present

## 2020-02-07 DIAGNOSIS — N2581 Secondary hyperparathyroidism of renal origin: Secondary | ICD-10-CM | POA: Diagnosis not present

## 2020-02-07 DIAGNOSIS — D509 Iron deficiency anemia, unspecified: Secondary | ICD-10-CM | POA: Diagnosis not present

## 2020-02-09 DIAGNOSIS — N186 End stage renal disease: Secondary | ICD-10-CM | POA: Diagnosis not present

## 2020-02-09 DIAGNOSIS — N2581 Secondary hyperparathyroidism of renal origin: Secondary | ICD-10-CM | POA: Diagnosis not present

## 2020-02-09 DIAGNOSIS — D509 Iron deficiency anemia, unspecified: Secondary | ICD-10-CM | POA: Diagnosis not present

## 2020-02-09 DIAGNOSIS — Z23 Encounter for immunization: Secondary | ICD-10-CM | POA: Diagnosis not present

## 2020-02-09 DIAGNOSIS — Z992 Dependence on renal dialysis: Secondary | ICD-10-CM | POA: Diagnosis not present

## 2020-02-11 DIAGNOSIS — N2581 Secondary hyperparathyroidism of renal origin: Secondary | ICD-10-CM | POA: Diagnosis not present

## 2020-02-11 DIAGNOSIS — Z992 Dependence on renal dialysis: Secondary | ICD-10-CM | POA: Diagnosis not present

## 2020-02-11 DIAGNOSIS — Z23 Encounter for immunization: Secondary | ICD-10-CM | POA: Diagnosis not present

## 2020-02-11 DIAGNOSIS — N186 End stage renal disease: Secondary | ICD-10-CM | POA: Diagnosis not present

## 2020-02-11 DIAGNOSIS — D509 Iron deficiency anemia, unspecified: Secondary | ICD-10-CM | POA: Diagnosis not present

## 2020-02-12 ENCOUNTER — Other Ambulatory Visit: Payer: Self-pay | Admitting: General Practice

## 2020-02-14 DIAGNOSIS — N2581 Secondary hyperparathyroidism of renal origin: Secondary | ICD-10-CM | POA: Diagnosis not present

## 2020-02-14 DIAGNOSIS — N186 End stage renal disease: Secondary | ICD-10-CM | POA: Diagnosis not present

## 2020-02-14 DIAGNOSIS — Z992 Dependence on renal dialysis: Secondary | ICD-10-CM | POA: Diagnosis not present

## 2020-02-14 DIAGNOSIS — D509 Iron deficiency anemia, unspecified: Secondary | ICD-10-CM | POA: Diagnosis not present

## 2020-02-14 DIAGNOSIS — Z23 Encounter for immunization: Secondary | ICD-10-CM | POA: Diagnosis not present

## 2020-02-16 DIAGNOSIS — D509 Iron deficiency anemia, unspecified: Secondary | ICD-10-CM | POA: Diagnosis not present

## 2020-02-16 DIAGNOSIS — N2581 Secondary hyperparathyroidism of renal origin: Secondary | ICD-10-CM | POA: Diagnosis not present

## 2020-02-16 DIAGNOSIS — Z992 Dependence on renal dialysis: Secondary | ICD-10-CM | POA: Diagnosis not present

## 2020-02-16 DIAGNOSIS — Z23 Encounter for immunization: Secondary | ICD-10-CM | POA: Diagnosis not present

## 2020-02-16 DIAGNOSIS — N186 End stage renal disease: Secondary | ICD-10-CM | POA: Diagnosis not present

## 2020-02-18 DIAGNOSIS — Z23 Encounter for immunization: Secondary | ICD-10-CM | POA: Diagnosis not present

## 2020-02-18 DIAGNOSIS — Z992 Dependence on renal dialysis: Secondary | ICD-10-CM | POA: Diagnosis not present

## 2020-02-18 DIAGNOSIS — D509 Iron deficiency anemia, unspecified: Secondary | ICD-10-CM | POA: Diagnosis not present

## 2020-02-18 DIAGNOSIS — N2581 Secondary hyperparathyroidism of renal origin: Secondary | ICD-10-CM | POA: Diagnosis not present

## 2020-02-18 DIAGNOSIS — N186 End stage renal disease: Secondary | ICD-10-CM | POA: Diagnosis not present

## 2020-02-21 DIAGNOSIS — Z992 Dependence on renal dialysis: Secondary | ICD-10-CM | POA: Diagnosis not present

## 2020-02-21 DIAGNOSIS — D509 Iron deficiency anemia, unspecified: Secondary | ICD-10-CM | POA: Diagnosis not present

## 2020-02-21 DIAGNOSIS — N2581 Secondary hyperparathyroidism of renal origin: Secondary | ICD-10-CM | POA: Diagnosis not present

## 2020-02-21 DIAGNOSIS — Z23 Encounter for immunization: Secondary | ICD-10-CM | POA: Diagnosis not present

## 2020-02-21 DIAGNOSIS — N186 End stage renal disease: Secondary | ICD-10-CM | POA: Diagnosis not present

## 2020-02-23 DIAGNOSIS — N2581 Secondary hyperparathyroidism of renal origin: Secondary | ICD-10-CM | POA: Diagnosis not present

## 2020-02-23 DIAGNOSIS — Z992 Dependence on renal dialysis: Secondary | ICD-10-CM | POA: Diagnosis not present

## 2020-02-23 DIAGNOSIS — D509 Iron deficiency anemia, unspecified: Secondary | ICD-10-CM | POA: Diagnosis not present

## 2020-02-23 DIAGNOSIS — N186 End stage renal disease: Secondary | ICD-10-CM | POA: Diagnosis not present

## 2020-02-23 DIAGNOSIS — Z23 Encounter for immunization: Secondary | ICD-10-CM | POA: Diagnosis not present

## 2020-02-25 DIAGNOSIS — N2581 Secondary hyperparathyroidism of renal origin: Secondary | ICD-10-CM | POA: Diagnosis not present

## 2020-02-25 DIAGNOSIS — Z23 Encounter for immunization: Secondary | ICD-10-CM | POA: Diagnosis not present

## 2020-02-25 DIAGNOSIS — Z992 Dependence on renal dialysis: Secondary | ICD-10-CM | POA: Diagnosis not present

## 2020-02-25 DIAGNOSIS — D509 Iron deficiency anemia, unspecified: Secondary | ICD-10-CM | POA: Diagnosis not present

## 2020-02-25 DIAGNOSIS — N186 End stage renal disease: Secondary | ICD-10-CM | POA: Diagnosis not present

## 2020-02-27 ENCOUNTER — Other Ambulatory Visit: Payer: Self-pay

## 2020-02-27 ENCOUNTER — Encounter: Payer: Self-pay | Admitting: Cardiovascular Disease

## 2020-02-27 ENCOUNTER — Ambulatory Visit (INDEPENDENT_AMBULATORY_CARE_PROVIDER_SITE_OTHER): Payer: Medicare Other | Admitting: Cardiovascular Disease

## 2020-02-27 VITALS — BP 130/82 | HR 86 | Ht 63.75 in | Wt 111.0 lb

## 2020-02-27 DIAGNOSIS — N2889 Other specified disorders of kidney and ureter: Secondary | ICD-10-CM | POA: Diagnosis not present

## 2020-02-27 DIAGNOSIS — I248 Other forms of acute ischemic heart disease: Secondary | ICD-10-CM

## 2020-02-27 DIAGNOSIS — I151 Hypertension secondary to other renal disorders: Secondary | ICD-10-CM

## 2020-02-27 DIAGNOSIS — R778 Other specified abnormalities of plasma proteins: Secondary | ICD-10-CM

## 2020-02-27 NOTE — Progress Notes (Signed)
Cardiology Office Note   Date:  02/27/2020   ID:  Yvonne Powell, DOB 1928/06/30, MRN [id number]  PCP:  Pcp, No  Cardiologist:   Skeet Latch, MD   No chief complaint on file.   History of Present Illness: Yvonne Powell is a 86 y.o. female with hypertension, hyperlipidemia,  chronic diastolic heart failure, prior DVT, ESRD on HD, and hypothyroidism who presents for follow-up.  She was admitted with acute on chronic shortness of breath and edema 09/2019.  Echo at that time revealed LVEF 50 to 55% with grade 1 diastolic dysfunction and a small pericardial effusion.  High-sensitivity troponin was elevated but thought to be due to demand ischemia.  She was diuresed with IV Lasix and discharged on oral Lasix.  She has an AV fistula in place and follows with nephrology.  She has since started on hemodialysis and her BP has been better controlled. She has followed up with [name] several times since discharge.  She notes that she is tired after her HD sessions.  Her BP has been stable.  She tries to walk for exercise.  She has no exertional chest pain, edema or shortness of breath.  She notes that her lipids have always been elevated.  Her sister also has elevated lipids.  She will be meeting with a nutritionist soon.    Past Medical History:  Diagnosis Date  . Anemia    due to kidney  . Chronic diastolic heart failure ([place]) 09/08/2019  . DVT (deep venous thrombosis) (HCC)    hx  . Gallstones 12/2018  . Hypertension   . Renal disorder   . Thyroid disease    graves dx    Past Surgical History:  Procedure Laterality Date  . AV FISTULA PLACEMENT Right 04/07/2017   Procedure: ARTERIOVENOUS (AV) FISTULA CREATION;  Surgeon: Elam Dutch, MD;  Location: [place];  Service: Vascular;  Laterality: Right;  . CHOLECYSTECTOMY N/A 12/23/2018   Procedure: LAPAROSCOPIC CHOLECYSTECTOMY;  Surgeon: Erroll Luna, MD;  Location: [place];  Service: General;  Laterality: N/A;  . COLONOSCOPY    .  DILATION AND CURETTAGE OF UTERUS    . EYE SURGERY     cactaracts  . LIPOMA EXCISION    . LUMBAR EPIDURAL INJECTION  2018     Current Outpatient Medications  Medication Sig Dispense Refill  . Iron-FA-DSS-B Cmplx-Vit C (NEPHRON FA) TABS     . levothyroxine (SYNTHROID, LEVOTHROID) 88 MCG tablet Take 88 mcg by mouth daily before breakfast.    . verapamil (CALAN-SR) 180 MG CR tablet TAKE 1 TABLET (180 MG TOTAL) BY MOUTH AT BEDTIME. 90 tablet 1   No current facility-administered medications for this visit.    Allergies:   Doxycycline, Fish allergy, Shellfish allergy, Clindamycin/lincomycin, Compazine [prochlorperazine edisylate], Iodine, Sulfa antibiotics, and Penicillins    Social History:  The patient  reports that she has never smoked. She has never used smokeless tobacco. She reports that she does not drink alcohol and does not use drugs.   Family History:  The patient's family history includes Congestive Heart Failure (age of onset: 45) in her maternal grandmother; Hypertension in her maternal grandmother.    ROS:  Please see the history of present illness.   Otherwise, review of systems are positive for none.   All other systems are reviewed and negative.    PHYSICAL EXAM: VS:  BP 130/82   Pulse 86   Ht 5' 3.75" (1.619 m)   Wt 111 lb (50.3 kg)  SpO2 98%   BMI 19.20 kg/m  , BMI Body mass index is 19.2 kg/m. GENERAL:  Well appearing HEENT:  Pupils equal round and reactive, fundi not visualized, oral mucosa unremarkable NECK:  No jugular venous distention, waveform within normal limits, carotid upstroke brisk and symmetric, no bruits, no thyromegaly LYMPHATICS:  No cervical adenopathy LUNGS:  Clear to auscultation bilaterally HEART:  RRR.  PMI not displaced or sustained,S1 and S2 within normal limits, no S3, no S4, no clicks, no rubs, III/VI systolic murmurs ABD:  Flat, positive bowel sounds normal in frequency in pitch, no bruits, no rebound, no guarding, no midline  pulsatile mass, no hepatomegaly, no splenomegaly EXT:  2 plus pulses throughout, no edema, no cyanosis no clubbing. R AC fistula with bruit SKIN:  No rashes no nodules NEURO:  Cranial nerves II through XII grossly intact, motor grossly intact throughout PSYCH:  Cognitively intact, oriented to person place and time  EKG:  EKG is not ordered today.   Echo 09/2019: 1. Left ventricular ejection fraction, by estimation, is 50 to 55%. The  left ventricle has low normal function. Septal-lateral dyssynchrony  consistent with LBBB. There is moderate left ventricular hypertrophy. Left  ventricular diastolic parameters are  consistent with Grade I diastolic dysfunction (impaired relaxation).  2. Right ventricular systolic function is normal. The right ventricular  size is normal. Tricuspid regurgitation signal is inadequate for assessing  PA pressure.  3. Left atrial size was moderately dilated.  4. The mitral valve is degenerative. Mild mitral valve regurgitation. No  evidence of mitral stenosis.  5. The aortic valve is tricuspid. Aortic valve regurgitation is not  visualized. Mild aortic valve sclerosis is present, with no evidence of  aortic valve stenosis.  6. The inferior vena cava is dilated in size with <50% respiratory  variability, suggesting right atrial pressure of 15 mmHg.  7. Small circumferential pericardial effusion, no evidence for tamponade.  I do not think that the dilated IVC is related to the pericardial  effusion.   Recent Labs: 09/08/2019: B Natriuretic Peptide 1,601.8 09/11/2019: BUN 72; Creatinine, Ser 4.92; Platelets 188; Potassium 4.0; Sodium 144 10/11/2019: Hemoglobin 11.2    Lipid Panel No results found for: CHOL, TRIG, HDL, CHOLHDL, VLDL, LDLCALC, LDLDIRECT    Wt Readings from Last 3 Encounters:  02/27/20 111 lb (50.3 kg)  11/30/19 111 lb (50.3 kg)  10/26/19 111 lb 12.8 oz (50.7 kg)      ASSESSMENT AND PLAN:  # Essential hypertension:  # Chronic  diastolic heart failure: Blood pressure has improved significantly since she was started on hemodialysis.  She is now only on verapamil and her blood pressure stable.  Continue current therapy.  Volume is managed with hemodialysis.  # Hypothyoridism: Continue levothyroxine.  # Hyperlipidemia:   Lipids are elevated.  She notes that this is a chronic problem for her.  HDL is quite high.  She has tried statins in the past and did not tolerate them.  Given her age she is not interested in trying any other cholesterol medications.  This is reasonable.  She will continue to focus on her diet and increasing her exercise.    Current medicines are reviewed at length with the patient today.  The patient does not have concerns regarding medicines.  The following changes have been made:  no change  Labs/ tests ordered today include:  No orders of the defined types were placed in this encounter.    Disposition:   FU with Lakyla Biswas C. Oval Linsey, MD,  FACC in 6 months.     Signed, Asa Fath C. Oval Linsey, MD, [facility]  02/27/2020 1:04 PM    [place]

## 2020-02-27 NOTE — Patient Instructions (Addendum)
Medication Instructions:  Your physician recommends that you continue on your current medications as directed. Please refer to the Current Medication list given to you today.  *If you need a refill on your cardiac medications before your next appointment, please call your pharmacy*  Lab Work: NONE   Testing/Procedures: NONE   Follow-Up: At [facility], you and your health needs are our priority.  As part of our continuing mission to provide you with exceptional heart care, we have created designated Provider Care Teams.  These Care Teams include your primary Cardiologist (physician) and Advanced Practice Providers (APPs -  Physician Assistants and Nurse Practitioners) who all work together to provide you with the care you need, when you need it.  We recommend signing up for the patient portal called "MyChart".  Sign up information is provided on this After Visit Summary.  MyChart is used to connect with patients for Virtual Visits (Telemedicine).  Patients are able to view lab/test results, encounter notes, upcoming appointments, etc.  Non-urgent messages can be sent to your provider as well.   To learn more about what you can do with MyChart, go to [email or web address].    Your next appointment:   6 month(s)  You will receive a reminder letter in the mail two months in advance. If you don't receive a letter, please call our office to schedule the follow-up appointment.  The format for your next appointment:   In Person  Provider:   You may see Tiffany [place], MD or one of the following Advanced Practice Providers on your designated Care Team:    Luke Kilroy, PA-C  Callie Goodrich, PA-C  Jesse Cleaver, FNP     

## 2020-02-28 DIAGNOSIS — D509 Iron deficiency anemia, unspecified: Secondary | ICD-10-CM | POA: Diagnosis not present

## 2020-02-28 DIAGNOSIS — Z992 Dependence on renal dialysis: Secondary | ICD-10-CM | POA: Diagnosis not present

## 2020-02-28 DIAGNOSIS — N2581 Secondary hyperparathyroidism of renal origin: Secondary | ICD-10-CM | POA: Diagnosis not present

## 2020-02-28 DIAGNOSIS — N186 End stage renal disease: Secondary | ICD-10-CM | POA: Diagnosis not present

## 2020-02-28 DIAGNOSIS — Z23 Encounter for immunization: Secondary | ICD-10-CM | POA: Diagnosis not present

## 2020-03-01 DIAGNOSIS — N2581 Secondary hyperparathyroidism of renal origin: Secondary | ICD-10-CM | POA: Diagnosis not present

## 2020-03-01 DIAGNOSIS — N186 End stage renal disease: Secondary | ICD-10-CM | POA: Diagnosis not present

## 2020-03-01 DIAGNOSIS — Z992 Dependence on renal dialysis: Secondary | ICD-10-CM | POA: Diagnosis not present

## 2020-03-01 DIAGNOSIS — D509 Iron deficiency anemia, unspecified: Secondary | ICD-10-CM | POA: Diagnosis not present

## 2020-03-01 DIAGNOSIS — Z23 Encounter for immunization: Secondary | ICD-10-CM | POA: Diagnosis not present

## 2020-03-03 DIAGNOSIS — Z23 Encounter for immunization: Secondary | ICD-10-CM | POA: Diagnosis not present

## 2020-03-03 DIAGNOSIS — D509 Iron deficiency anemia, unspecified: Secondary | ICD-10-CM | POA: Diagnosis not present

## 2020-03-03 DIAGNOSIS — N186 End stage renal disease: Secondary | ICD-10-CM | POA: Diagnosis not present

## 2020-03-03 DIAGNOSIS — Z992 Dependence on renal dialysis: Secondary | ICD-10-CM | POA: Diagnosis not present

## 2020-03-03 DIAGNOSIS — N2581 Secondary hyperparathyroidism of renal origin: Secondary | ICD-10-CM | POA: Diagnosis not present

## 2020-03-05 DIAGNOSIS — N186 End stage renal disease: Secondary | ICD-10-CM | POA: Diagnosis not present

## 2020-03-05 DIAGNOSIS — Z992 Dependence on renal dialysis: Secondary | ICD-10-CM | POA: Diagnosis not present

## 2020-03-05 DIAGNOSIS — I129 Hypertensive chronic kidney disease with stage 1 through stage 4 chronic kidney disease, or unspecified chronic kidney disease: Secondary | ICD-10-CM | POA: Diagnosis not present

## 2020-03-06 DIAGNOSIS — Z23 Encounter for immunization: Secondary | ICD-10-CM | POA: Diagnosis not present

## 2020-03-06 DIAGNOSIS — D509 Iron deficiency anemia, unspecified: Secondary | ICD-10-CM | POA: Diagnosis not present

## 2020-03-06 DIAGNOSIS — E875 Hyperkalemia: Secondary | ICD-10-CM | POA: Diagnosis not present

## 2020-03-06 DIAGNOSIS — N186 End stage renal disease: Secondary | ICD-10-CM | POA: Diagnosis not present

## 2020-03-06 DIAGNOSIS — Z992 Dependence on renal dialysis: Secondary | ICD-10-CM | POA: Diagnosis not present

## 2020-03-06 DIAGNOSIS — N2581 Secondary hyperparathyroidism of renal origin: Secondary | ICD-10-CM | POA: Diagnosis not present

## 2020-03-08 DIAGNOSIS — N2581 Secondary hyperparathyroidism of renal origin: Secondary | ICD-10-CM | POA: Diagnosis not present

## 2020-03-08 DIAGNOSIS — Z992 Dependence on renal dialysis: Secondary | ICD-10-CM | POA: Diagnosis not present

## 2020-03-08 DIAGNOSIS — D509 Iron deficiency anemia, unspecified: Secondary | ICD-10-CM | POA: Diagnosis not present

## 2020-03-08 DIAGNOSIS — E875 Hyperkalemia: Secondary | ICD-10-CM | POA: Diagnosis not present

## 2020-03-08 DIAGNOSIS — N186 End stage renal disease: Secondary | ICD-10-CM | POA: Diagnosis not present

## 2020-03-08 DIAGNOSIS — Z23 Encounter for immunization: Secondary | ICD-10-CM | POA: Diagnosis not present

## 2020-03-10 DIAGNOSIS — N186 End stage renal disease: Secondary | ICD-10-CM | POA: Diagnosis not present

## 2020-03-10 DIAGNOSIS — E875 Hyperkalemia: Secondary | ICD-10-CM | POA: Diagnosis not present

## 2020-03-10 DIAGNOSIS — N2581 Secondary hyperparathyroidism of renal origin: Secondary | ICD-10-CM | POA: Diagnosis not present

## 2020-03-10 DIAGNOSIS — Z992 Dependence on renal dialysis: Secondary | ICD-10-CM | POA: Diagnosis not present

## 2020-03-10 DIAGNOSIS — D509 Iron deficiency anemia, unspecified: Secondary | ICD-10-CM | POA: Diagnosis not present

## 2020-03-10 DIAGNOSIS — Z23 Encounter for immunization: Secondary | ICD-10-CM | POA: Diagnosis not present

## 2020-03-13 DIAGNOSIS — E875 Hyperkalemia: Secondary | ICD-10-CM | POA: Diagnosis not present

## 2020-03-13 DIAGNOSIS — N186 End stage renal disease: Secondary | ICD-10-CM | POA: Diagnosis not present

## 2020-03-13 DIAGNOSIS — N2581 Secondary hyperparathyroidism of renal origin: Secondary | ICD-10-CM | POA: Diagnosis not present

## 2020-03-13 DIAGNOSIS — Z992 Dependence on renal dialysis: Secondary | ICD-10-CM | POA: Diagnosis not present

## 2020-03-13 DIAGNOSIS — Z23 Encounter for immunization: Secondary | ICD-10-CM | POA: Diagnosis not present

## 2020-03-13 DIAGNOSIS — D509 Iron deficiency anemia, unspecified: Secondary | ICD-10-CM | POA: Diagnosis not present

## 2020-03-15 DIAGNOSIS — Z992 Dependence on renal dialysis: Secondary | ICD-10-CM | POA: Diagnosis not present

## 2020-03-15 DIAGNOSIS — E875 Hyperkalemia: Secondary | ICD-10-CM | POA: Diagnosis not present

## 2020-03-15 DIAGNOSIS — N2581 Secondary hyperparathyroidism of renal origin: Secondary | ICD-10-CM | POA: Diagnosis not present

## 2020-03-15 DIAGNOSIS — D509 Iron deficiency anemia, unspecified: Secondary | ICD-10-CM | POA: Diagnosis not present

## 2020-03-15 DIAGNOSIS — Z23 Encounter for immunization: Secondary | ICD-10-CM | POA: Diagnosis not present

## 2020-03-15 DIAGNOSIS — N186 End stage renal disease: Secondary | ICD-10-CM | POA: Diagnosis not present

## 2020-03-17 DIAGNOSIS — E875 Hyperkalemia: Secondary | ICD-10-CM | POA: Diagnosis not present

## 2020-03-17 DIAGNOSIS — Z992 Dependence on renal dialysis: Secondary | ICD-10-CM | POA: Diagnosis not present

## 2020-03-17 DIAGNOSIS — N2581 Secondary hyperparathyroidism of renal origin: Secondary | ICD-10-CM | POA: Diagnosis not present

## 2020-03-17 DIAGNOSIS — N186 End stage renal disease: Secondary | ICD-10-CM | POA: Diagnosis not present

## 2020-03-17 DIAGNOSIS — Z23 Encounter for immunization: Secondary | ICD-10-CM | POA: Diagnosis not present

## 2020-03-17 DIAGNOSIS — D509 Iron deficiency anemia, unspecified: Secondary | ICD-10-CM | POA: Diagnosis not present

## 2020-03-20 DIAGNOSIS — Z23 Encounter for immunization: Secondary | ICD-10-CM | POA: Diagnosis not present

## 2020-03-20 DIAGNOSIS — N2581 Secondary hyperparathyroidism of renal origin: Secondary | ICD-10-CM | POA: Diagnosis not present

## 2020-03-20 DIAGNOSIS — Z992 Dependence on renal dialysis: Secondary | ICD-10-CM | POA: Diagnosis not present

## 2020-03-20 DIAGNOSIS — N186 End stage renal disease: Secondary | ICD-10-CM | POA: Diagnosis not present

## 2020-03-20 DIAGNOSIS — D509 Iron deficiency anemia, unspecified: Secondary | ICD-10-CM | POA: Diagnosis not present

## 2020-03-20 DIAGNOSIS — E875 Hyperkalemia: Secondary | ICD-10-CM | POA: Diagnosis not present

## 2020-03-22 DIAGNOSIS — E875 Hyperkalemia: Secondary | ICD-10-CM | POA: Diagnosis not present

## 2020-03-22 DIAGNOSIS — N2581 Secondary hyperparathyroidism of renal origin: Secondary | ICD-10-CM | POA: Diagnosis not present

## 2020-03-22 DIAGNOSIS — N186 End stage renal disease: Secondary | ICD-10-CM | POA: Diagnosis not present

## 2020-03-22 DIAGNOSIS — D509 Iron deficiency anemia, unspecified: Secondary | ICD-10-CM | POA: Diagnosis not present

## 2020-03-22 DIAGNOSIS — Z23 Encounter for immunization: Secondary | ICD-10-CM | POA: Diagnosis not present

## 2020-03-22 DIAGNOSIS — Z992 Dependence on renal dialysis: Secondary | ICD-10-CM | POA: Diagnosis not present

## 2020-03-24 DIAGNOSIS — E875 Hyperkalemia: Secondary | ICD-10-CM | POA: Diagnosis not present

## 2020-03-24 DIAGNOSIS — N2581 Secondary hyperparathyroidism of renal origin: Secondary | ICD-10-CM | POA: Diagnosis not present

## 2020-03-24 DIAGNOSIS — Z992 Dependence on renal dialysis: Secondary | ICD-10-CM | POA: Diagnosis not present

## 2020-03-24 DIAGNOSIS — N186 End stage renal disease: Secondary | ICD-10-CM | POA: Diagnosis not present

## 2020-03-24 DIAGNOSIS — D509 Iron deficiency anemia, unspecified: Secondary | ICD-10-CM | POA: Diagnosis not present

## 2020-03-24 DIAGNOSIS — Z23 Encounter for immunization: Secondary | ICD-10-CM | POA: Diagnosis not present

## 2020-03-26 DIAGNOSIS — N186 End stage renal disease: Secondary | ICD-10-CM | POA: Diagnosis not present

## 2020-03-26 DIAGNOSIS — Z992 Dependence on renal dialysis: Secondary | ICD-10-CM | POA: Diagnosis not present

## 2020-03-26 DIAGNOSIS — E875 Hyperkalemia: Secondary | ICD-10-CM | POA: Diagnosis not present

## 2020-03-26 DIAGNOSIS — Z23 Encounter for immunization: Secondary | ICD-10-CM | POA: Diagnosis not present

## 2020-03-26 DIAGNOSIS — D509 Iron deficiency anemia, unspecified: Secondary | ICD-10-CM | POA: Diagnosis not present

## 2020-03-26 DIAGNOSIS — N2581 Secondary hyperparathyroidism of renal origin: Secondary | ICD-10-CM | POA: Diagnosis not present

## 2020-03-28 DIAGNOSIS — D509 Iron deficiency anemia, unspecified: Secondary | ICD-10-CM | POA: Diagnosis not present

## 2020-03-28 DIAGNOSIS — E875 Hyperkalemia: Secondary | ICD-10-CM | POA: Diagnosis not present

## 2020-03-28 DIAGNOSIS — Z992 Dependence on renal dialysis: Secondary | ICD-10-CM | POA: Diagnosis not present

## 2020-03-28 DIAGNOSIS — N2581 Secondary hyperparathyroidism of renal origin: Secondary | ICD-10-CM | POA: Diagnosis not present

## 2020-03-28 DIAGNOSIS — N186 End stage renal disease: Secondary | ICD-10-CM | POA: Diagnosis not present

## 2020-03-28 DIAGNOSIS — Z23 Encounter for immunization: Secondary | ICD-10-CM | POA: Diagnosis not present

## 2020-03-31 DIAGNOSIS — Z992 Dependence on renal dialysis: Secondary | ICD-10-CM | POA: Diagnosis not present

## 2020-03-31 DIAGNOSIS — D509 Iron deficiency anemia, unspecified: Secondary | ICD-10-CM | POA: Diagnosis not present

## 2020-03-31 DIAGNOSIS — Z23 Encounter for immunization: Secondary | ICD-10-CM | POA: Diagnosis not present

## 2020-03-31 DIAGNOSIS — N2581 Secondary hyperparathyroidism of renal origin: Secondary | ICD-10-CM | POA: Diagnosis not present

## 2020-03-31 DIAGNOSIS — E875 Hyperkalemia: Secondary | ICD-10-CM | POA: Diagnosis not present

## 2020-03-31 DIAGNOSIS — N186 End stage renal disease: Secondary | ICD-10-CM | POA: Diagnosis not present

## 2020-04-03 DIAGNOSIS — Z23 Encounter for immunization: Secondary | ICD-10-CM | POA: Diagnosis not present

## 2020-04-03 DIAGNOSIS — D509 Iron deficiency anemia, unspecified: Secondary | ICD-10-CM | POA: Diagnosis not present

## 2020-04-03 DIAGNOSIS — N186 End stage renal disease: Secondary | ICD-10-CM | POA: Diagnosis not present

## 2020-04-03 DIAGNOSIS — N2581 Secondary hyperparathyroidism of renal origin: Secondary | ICD-10-CM | POA: Diagnosis not present

## 2020-04-03 DIAGNOSIS — Z992 Dependence on renal dialysis: Secondary | ICD-10-CM | POA: Diagnosis not present

## 2020-04-03 DIAGNOSIS — E875 Hyperkalemia: Secondary | ICD-10-CM | POA: Diagnosis not present

## 2020-04-04 DIAGNOSIS — Z992 Dependence on renal dialysis: Secondary | ICD-10-CM | POA: Diagnosis not present

## 2020-04-04 DIAGNOSIS — I129 Hypertensive chronic kidney disease with stage 1 through stage 4 chronic kidney disease, or unspecified chronic kidney disease: Secondary | ICD-10-CM | POA: Diagnosis not present

## 2020-04-04 DIAGNOSIS — N186 End stage renal disease: Secondary | ICD-10-CM | POA: Diagnosis not present

## 2020-04-05 DIAGNOSIS — D631 Anemia in chronic kidney disease: Secondary | ICD-10-CM | POA: Diagnosis not present

## 2020-04-05 DIAGNOSIS — N186 End stage renal disease: Secondary | ICD-10-CM | POA: Diagnosis not present

## 2020-04-05 DIAGNOSIS — N2581 Secondary hyperparathyroidism of renal origin: Secondary | ICD-10-CM | POA: Diagnosis not present

## 2020-04-05 DIAGNOSIS — Z992 Dependence on renal dialysis: Secondary | ICD-10-CM | POA: Diagnosis not present

## 2020-04-07 DIAGNOSIS — Z992 Dependence on renal dialysis: Secondary | ICD-10-CM | POA: Diagnosis not present

## 2020-04-07 DIAGNOSIS — N2581 Secondary hyperparathyroidism of renal origin: Secondary | ICD-10-CM | POA: Diagnosis not present

## 2020-04-07 DIAGNOSIS — N186 End stage renal disease: Secondary | ICD-10-CM | POA: Diagnosis not present

## 2020-04-07 DIAGNOSIS — D631 Anemia in chronic kidney disease: Secondary | ICD-10-CM | POA: Diagnosis not present

## 2020-04-10 DIAGNOSIS — D631 Anemia in chronic kidney disease: Secondary | ICD-10-CM | POA: Diagnosis not present

## 2020-04-10 DIAGNOSIS — N186 End stage renal disease: Secondary | ICD-10-CM | POA: Diagnosis not present

## 2020-04-10 DIAGNOSIS — N2581 Secondary hyperparathyroidism of renal origin: Secondary | ICD-10-CM | POA: Diagnosis not present

## 2020-04-10 DIAGNOSIS — Z992 Dependence on renal dialysis: Secondary | ICD-10-CM | POA: Diagnosis not present

## 2020-04-12 DIAGNOSIS — Z992 Dependence on renal dialysis: Secondary | ICD-10-CM | POA: Diagnosis not present

## 2020-04-12 DIAGNOSIS — N2581 Secondary hyperparathyroidism of renal origin: Secondary | ICD-10-CM | POA: Diagnosis not present

## 2020-04-12 DIAGNOSIS — N186 End stage renal disease: Secondary | ICD-10-CM | POA: Diagnosis not present

## 2020-04-12 DIAGNOSIS — D631 Anemia in chronic kidney disease: Secondary | ICD-10-CM | POA: Diagnosis not present

## 2020-04-14 DIAGNOSIS — N2581 Secondary hyperparathyroidism of renal origin: Secondary | ICD-10-CM | POA: Diagnosis not present

## 2020-04-14 DIAGNOSIS — Z992 Dependence on renal dialysis: Secondary | ICD-10-CM | POA: Diagnosis not present

## 2020-04-14 DIAGNOSIS — N186 End stage renal disease: Secondary | ICD-10-CM | POA: Diagnosis not present

## 2020-04-14 DIAGNOSIS — D631 Anemia in chronic kidney disease: Secondary | ICD-10-CM | POA: Diagnosis not present

## 2020-04-15 IMAGING — CT CT ABDOMEN AND PELVIS WITHOUT CONTRAST
2 of 4 series · 14 of 46 positions shown, 16 images · non-contrast
Comparison: None.

CLINICAL DATA: Abdominal pain starting this morning. Abdominal
distention. Chronic renal disease.

EXAM:
CT ABDOMEN AND PELVIS WITHOUT CONTRAST
TECHNIQUE: Multidetector CT imaging of the abdomen and pelvis was performed
following the standard protocol without IV contrast.

[Series 3: ap without · axial · non-contrast · 0.72mm/px · z∈[+829,+1199]mm · 11 of 84 slices shown, 13 images]
[im 5/84  soft-tissue]
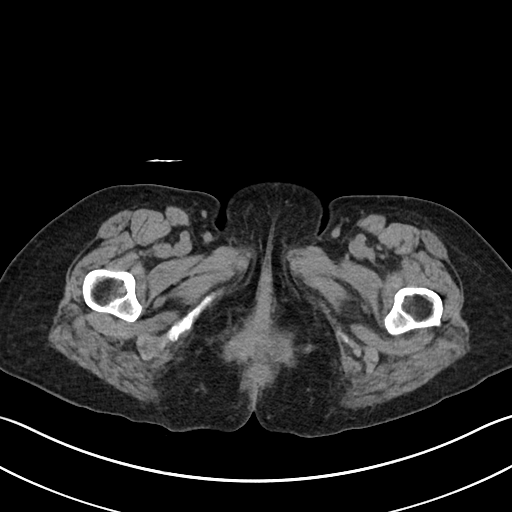
[im 5/84  bone]
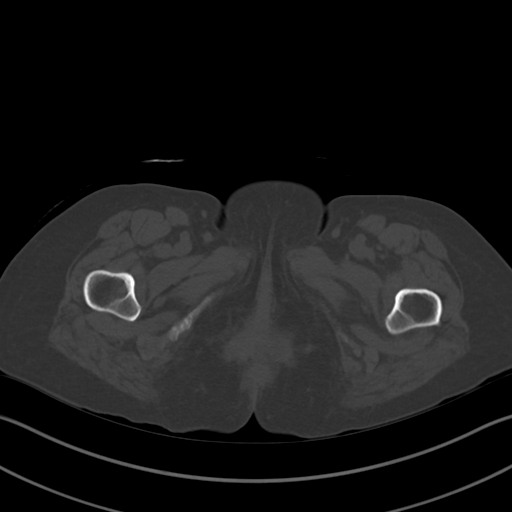
[im 14/84  soft-tissue]
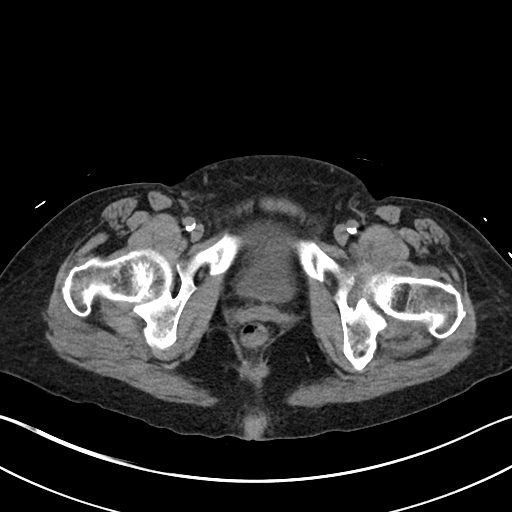
[im 22/84  soft-tissue]
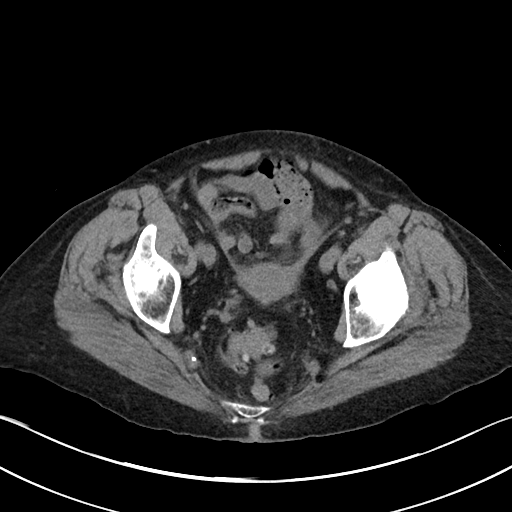
[im 27/84  soft-tissue]
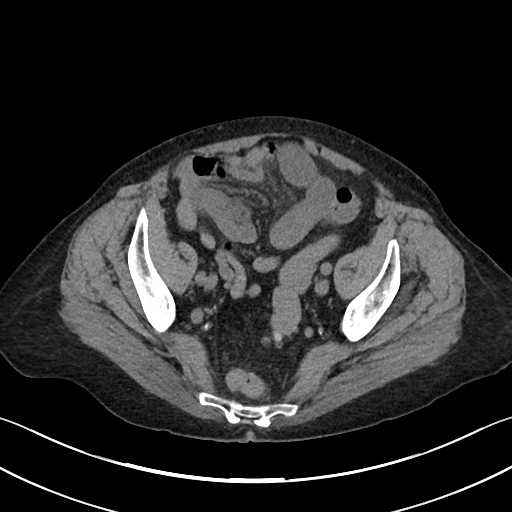
[im 35/84  soft-tissue]
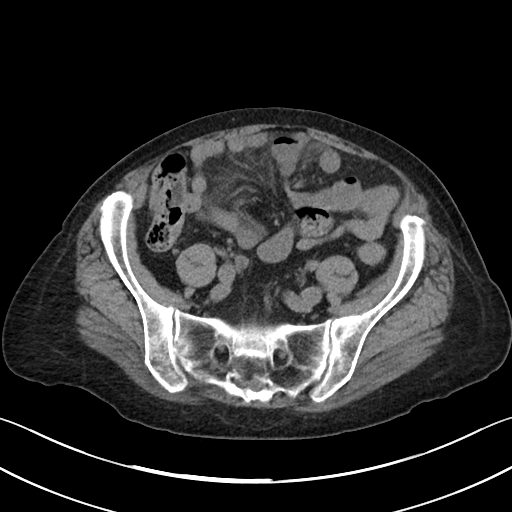
[im 44/84  soft-tissue]
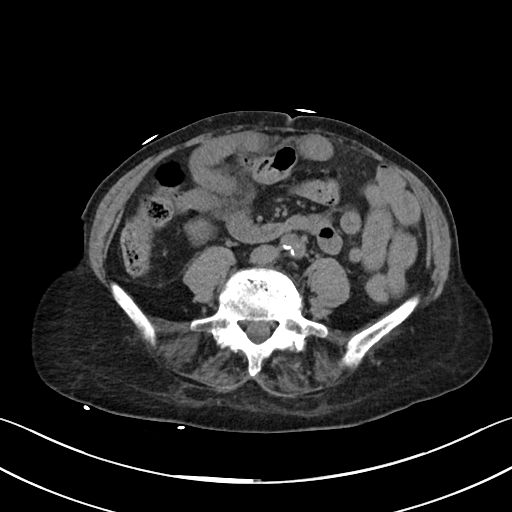
[im 49/84  soft-tissue]
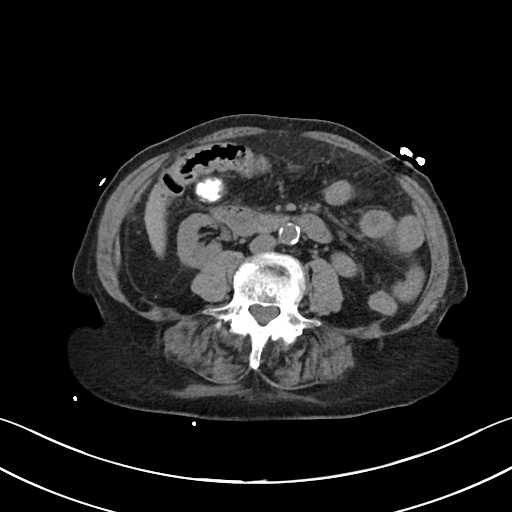
[im 57/84  soft-tissue]
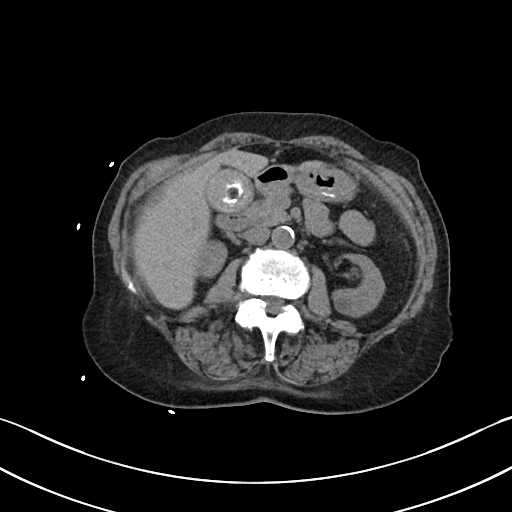
[im 62/84  soft-tissue]
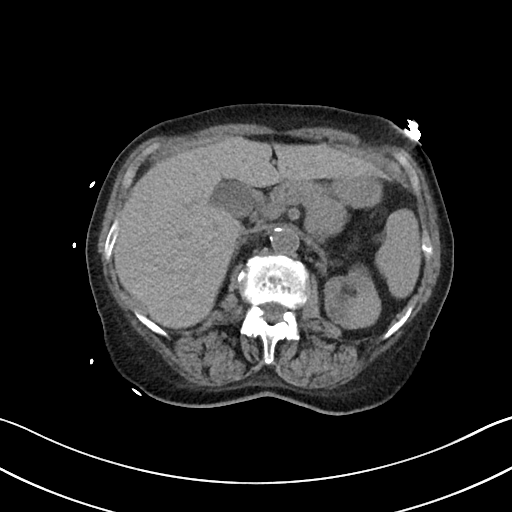
[im 62/84  bone]
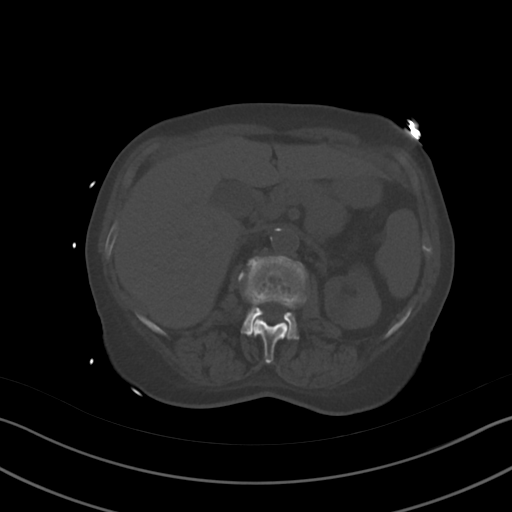
[im 70/84  soft-tissue]
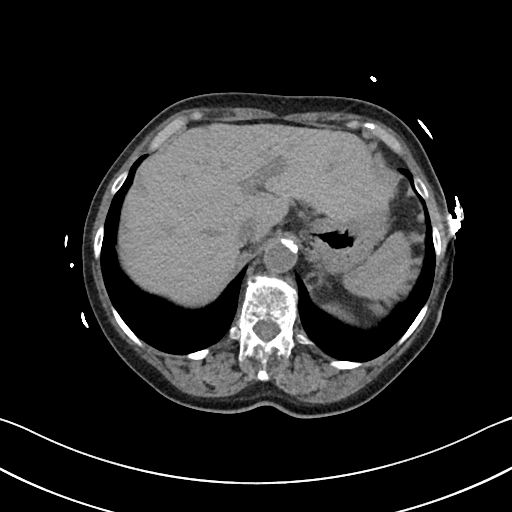
[im 79/84  soft-tissue]
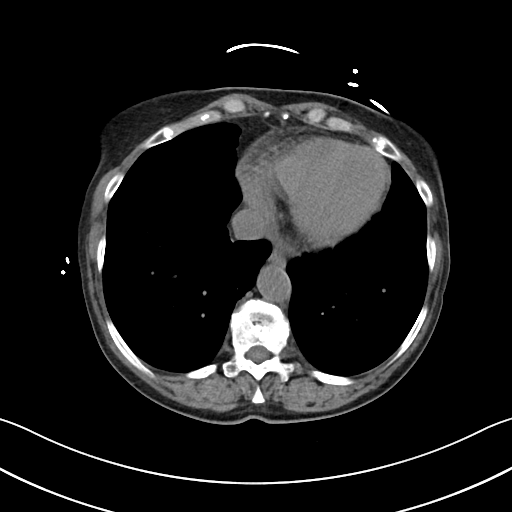

[Series 6: cor · coronal · 0.63mm/px · 3 of 90 slices shown]
[im 30/90  soft-tissue]
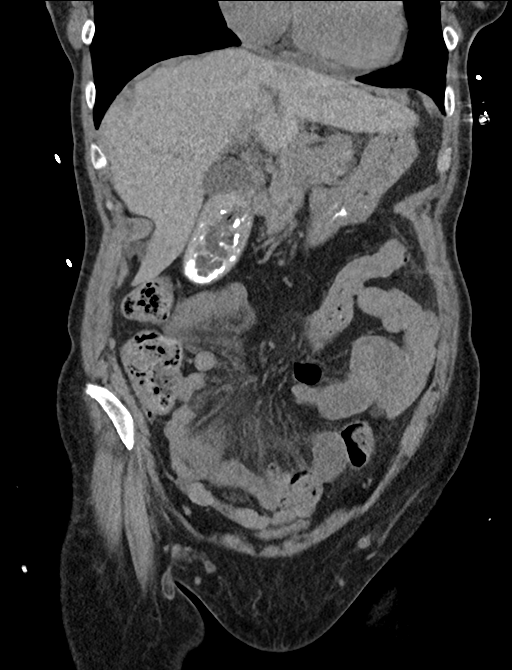
[im 40/90  soft-tissue]
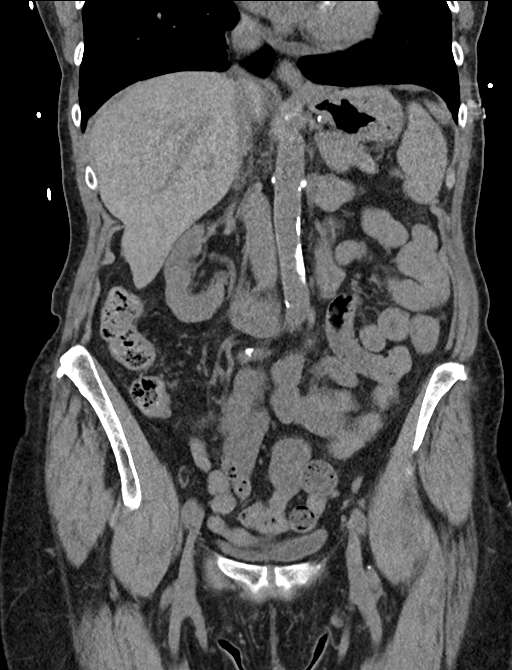
[im 50/90  soft-tissue]
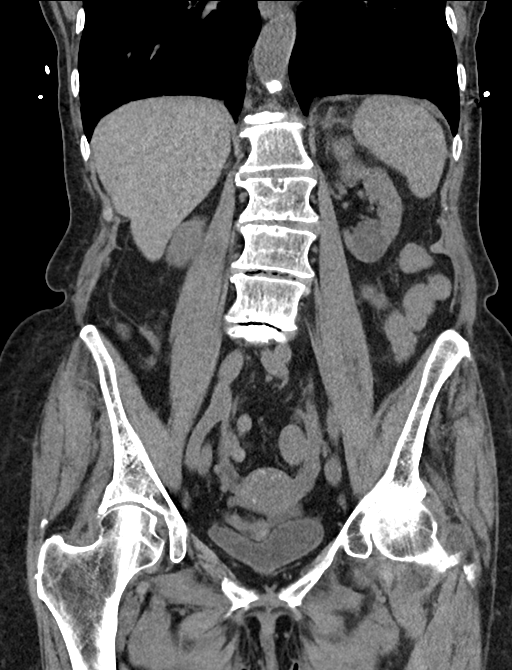

[14 of 46 positions shown; findings below may reference images not displayed]

FINDINGS: Lower chest: Patchy ground-glass opacities with some associated
interstitial accentuation in both lower lobes, in the lingula, and
in the right middle lobe. Descending thoracic aortic atherosclerotic
calcification.

Hepatobiliary: Mixed density in the gallbladder fundus with
calcified elements, nitrogen gas elements, and surrounding density
compatible with either large conglomerate gallstones or gallstones
with surrounding sludge. Underlying mass of the gallbladder is less
likely. I do not see gallbladder wall thickening in the proximal
gallbladder. Noncontrast CT appearance of the liver otherwise
unremarkable.

Pancreas: Unremarkable

Spleen: Unremarkable

Adrenals/Urinary Tract: Both adrenal glands appear normal. The
noncontrast CT appearance of the right kidney is unremarkable.

In the left kidney upper pole, there is a homogeneously dense 3.3 by
2.9 cm partially exophytic lesion with internal density of 61
Hounsfield units, more dense than the adjacent renal parenchyma.

Posteriorly in the right kidney lower pole there is a 1.1 cm
hypodense lesion on image [DATE], probably a cyst.

Centrally along the right kidney lower pole and partially extending
into the renal pelvis, a [DATE] by 2.1 cm fluid density lesion is
present on image 42/6, probably a parapelvic cyst but not entirely
specific.

In the right kidney upper pole, adjacent to the homogeneous complex
lesion, there is a somewhat more heterogeneous 3.0 by 2.3 cm lesion
shown on image 36/6, which appears to probably have a punctate
internal calcification.

Stomach/Bowel: There is abnormal stranding in loops of small bowel
mesentery in the central and right upper pelvis. Normal appendix. No
dilated bowel. No pneumatosis. Sigmoid colon diverticulosis is
present. No extraluminal gas or abscess.

Vascular/Lymphatic: Aortoiliac atherosclerotic vascular disease. No
pathologic adenopathy.

Reproductive: Unremarkable

Other: Small localized amount of ascites along the anterior margin
of the inferior right hepatic lobe for example images 32-34 of
series 3.

Musculoskeletal: Left hip effusion. Questionable left iliopsoas
bursitis. Grade 1 degenerative retrolisthesis at T12-L1 and grade 1
degenerative anterolisthesis at L3-4 and L4-5. Multilevel Schmorl's
nodes in the lumbar spine with spondylosis and degenerative disc
disease causing mild multilevel impingement. Degenerative
arthropathy of both hips, left greater than right. Mild levoconvex
lumbar scoliosis.
IMPRESSION: 1. The dominant finding most likely correlating with the patient's
symptoms is abnormal mesenteric stranding along several loops of
small bowel the central and right upper pelvis. The appearance is
nonspecific but could reflect edema associated with inflammation or
even ischemia, although there is no appreciable pneumatosis or
extraluminal gas at this time, and no dilated bowel.
2. There is a small amount of ascites adjacent to the inferior right
hepatic lobe margin.
3. Large conglomerate gallstones filling much of the gallbladder,
possibly with surrounding sludge. Strictly speaking, cholecystitis
is not readily excluded given the unusual appearance of density
around rim of the gallstone.
4. Multiple renal lesions are present. There are 2 complex lesions
of the left kidney upper pole, 1 of these is homogeneously dense and
probably a complex cyst, but the lesion next to it is somewhat more
heterogeneous and could represent a mass/malignancy. I am aware that
the patient cannot have IV contrast at this time due to renal
insufficiency. Possibilities for further workup might include pelvic
ultrasound to assess for vascularity in either lesion, or follow up
surveillance CT or MRI.
5. Patchy ground-glass opacities at both lung bases. Chronicity is
not established. Possibilities may include hypersensitivity
pneumonitis, atypical pneumonia, pulmonary hemorrhage, or chronic
pulmonary embolus.
6. Left hip effusion with questionable left iliopsoas bursitis. Left
greater than right degenerative hip arthropathy.
7. Mild multilevel impingement in the lumbar spine due to
spondylosis and degenerative disc disease.
8.  Aortic Atherosclerosis ([id number]).

## 2020-04-17 DIAGNOSIS — N2581 Secondary hyperparathyroidism of renal origin: Secondary | ICD-10-CM | POA: Diagnosis not present

## 2020-04-17 DIAGNOSIS — Z992 Dependence on renal dialysis: Secondary | ICD-10-CM | POA: Diagnosis not present

## 2020-04-17 DIAGNOSIS — N186 End stage renal disease: Secondary | ICD-10-CM | POA: Diagnosis not present

## 2020-04-17 DIAGNOSIS — D631 Anemia in chronic kidney disease: Secondary | ICD-10-CM | POA: Diagnosis not present

## 2020-04-19 DIAGNOSIS — Z992 Dependence on renal dialysis: Secondary | ICD-10-CM | POA: Diagnosis not present

## 2020-04-19 DIAGNOSIS — D631 Anemia in chronic kidney disease: Secondary | ICD-10-CM | POA: Diagnosis not present

## 2020-04-19 DIAGNOSIS — N186 End stage renal disease: Secondary | ICD-10-CM | POA: Diagnosis not present

## 2020-04-19 DIAGNOSIS — N2581 Secondary hyperparathyroidism of renal origin: Secondary | ICD-10-CM | POA: Diagnosis not present

## 2020-04-20 IMAGING — DX PORTABLE ABDOMEN - 1 VIEW
2 series · 2 of 2 positions shown · non-contrast
Comparison: None.

CLINICAL DATA: Pt complains of superior abdominal pain. She states
it feels like "gas pain." Hx laparoscopic cholecystectomy on
12/23/18.

EXAM:
PORTABLE ABDOMEN - 1 VIEW

[abdomen kub (1 of 2)]
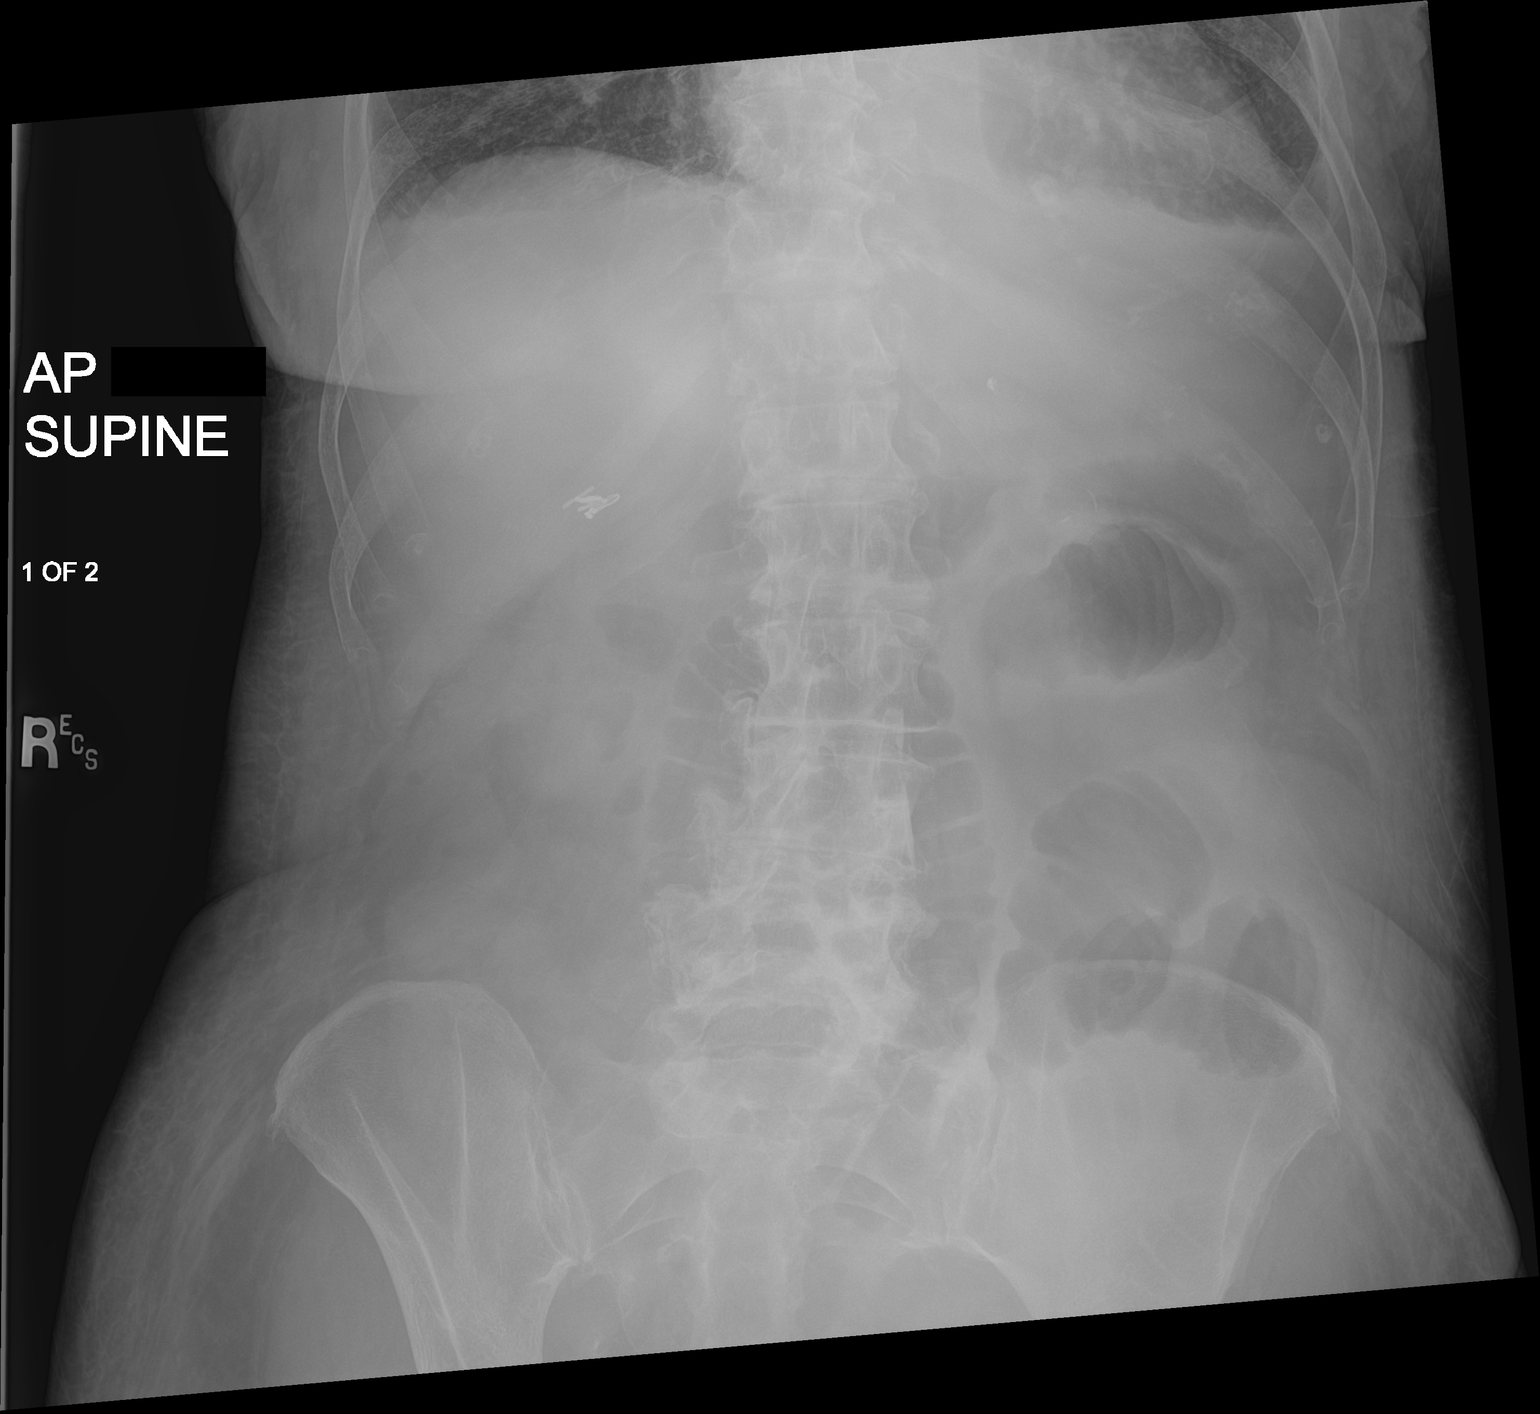

[abdomen kub (2 of 2)]
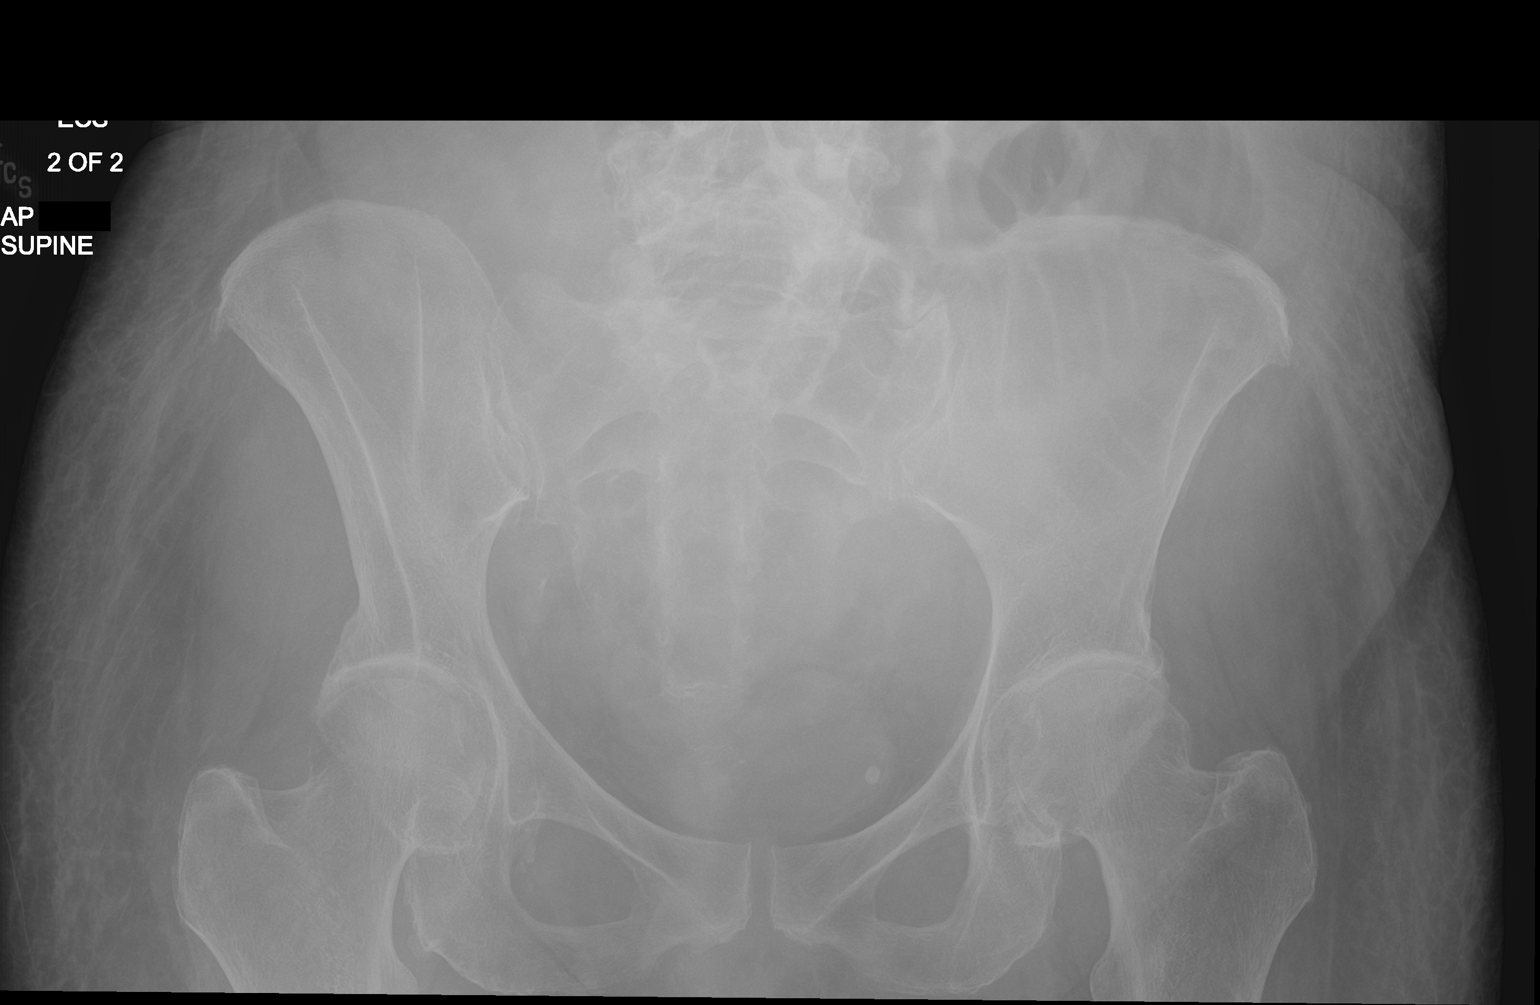

[2 of 2 positions shown; findings below may reference images not displayed]

FINDINGS: There are dilated loops of small bowel in the LEFT central abdomen.
No evidence for free intraperitoneal air. There is a small LEFT
pleural effusion. LEFT basilar atelectasis.

Note is made of body wall edema.
IMPRESSION: Small-bowel obstruction. Recommend further evaluation CT of the
abdomen and pelvis with intravenous and oral contrast, if tolerated.

## 2020-04-21 DIAGNOSIS — N186 End stage renal disease: Secondary | ICD-10-CM | POA: Diagnosis not present

## 2020-04-21 DIAGNOSIS — N2581 Secondary hyperparathyroidism of renal origin: Secondary | ICD-10-CM | POA: Diagnosis not present

## 2020-04-21 DIAGNOSIS — Z992 Dependence on renal dialysis: Secondary | ICD-10-CM | POA: Diagnosis not present

## 2020-04-21 DIAGNOSIS — D631 Anemia in chronic kidney disease: Secondary | ICD-10-CM | POA: Diagnosis not present

## 2020-04-21 IMAGING — DX PORTABLE ABDOMEN - 1 VIEW
1 series · 1 of 1 positions shown · non-contrast
Comparison: Abdominal radiograph 12/25/2018

CLINICAL DATA: Emesis.

EXAM:
PORTABLE ABDOMEN - 1 VIEW

[abdomen kub]
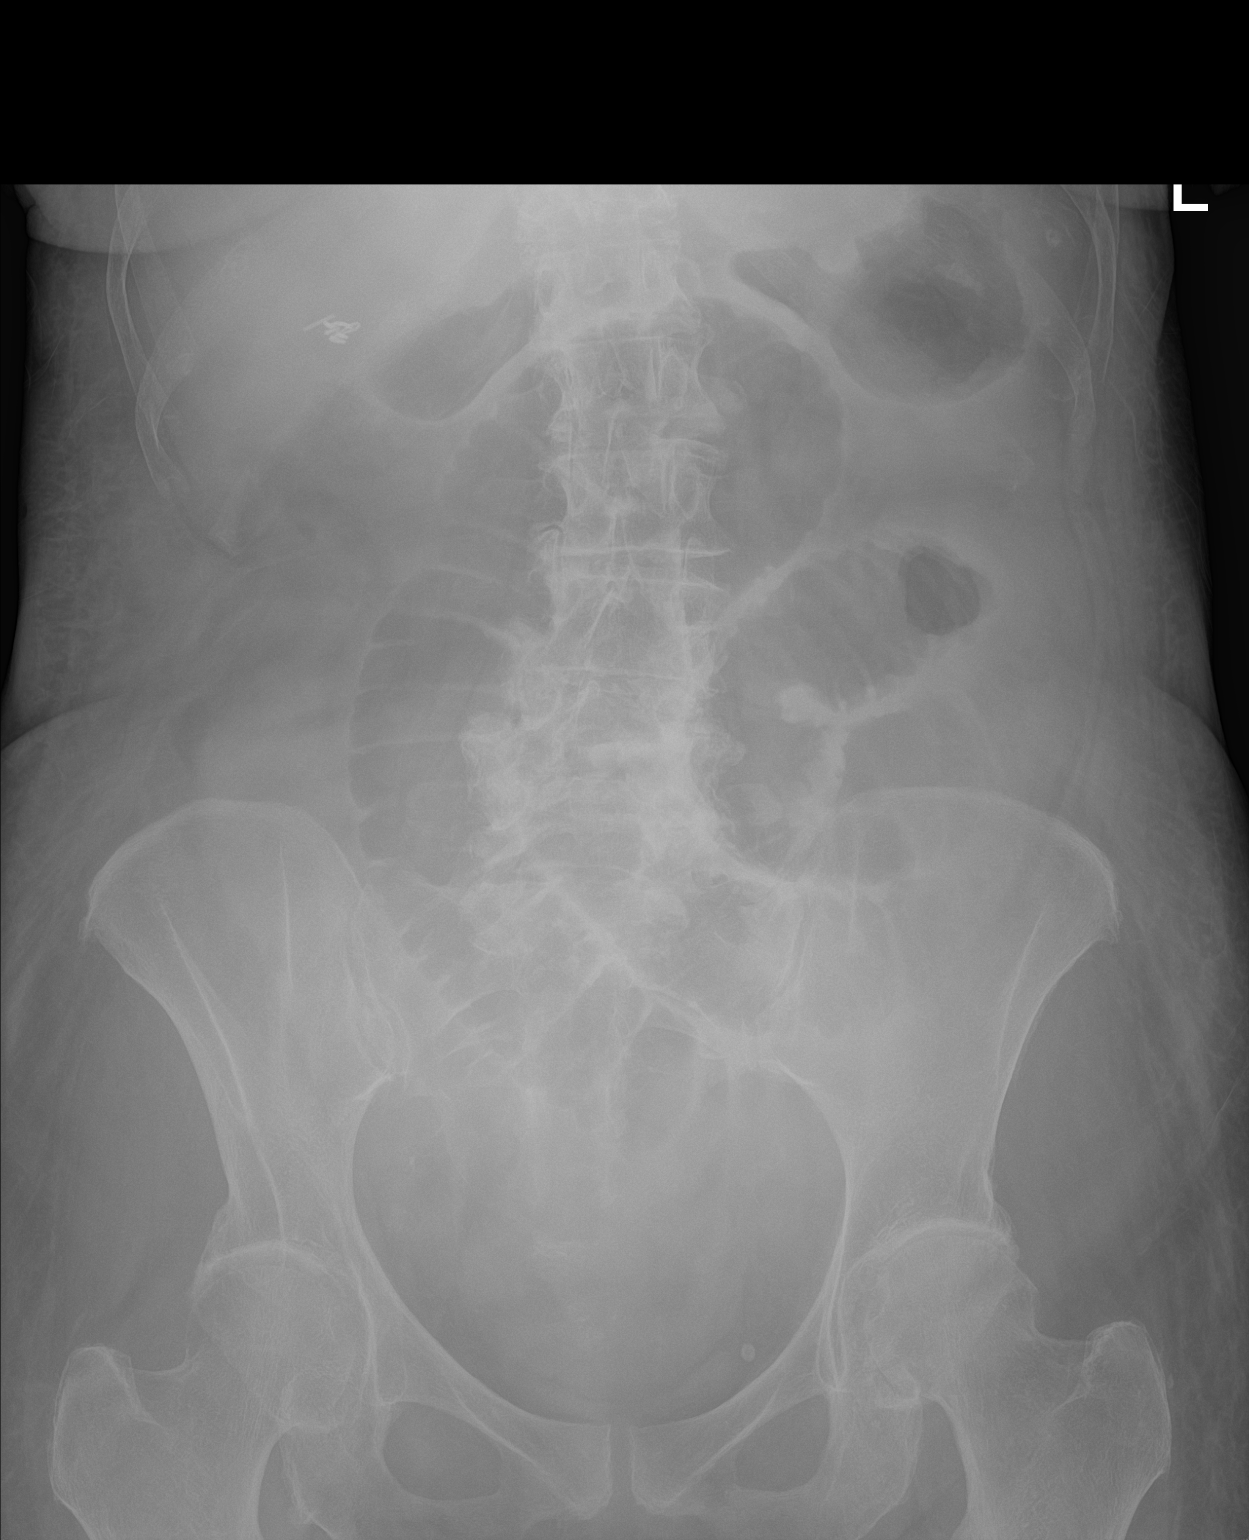

[1 of 1 positions shown; findings below may reference images not displayed]

FINDINGS: Interval increase in gaseous distended loops of small bowel within
the central abdomen. No distal colonic gas is identified.
Heterogeneous opacities lung bases bilaterally. Cholecystectomy
clips. Lumbar spine degenerative changes.
IMPRESSION: Increasing gaseous distended loops of small bowel in the central
abdomen most compatible with small bowel obstruction.

## 2020-04-24 DIAGNOSIS — Z992 Dependence on renal dialysis: Secondary | ICD-10-CM | POA: Diagnosis not present

## 2020-04-24 DIAGNOSIS — N186 End stage renal disease: Secondary | ICD-10-CM | POA: Diagnosis not present

## 2020-04-24 DIAGNOSIS — N2581 Secondary hyperparathyroidism of renal origin: Secondary | ICD-10-CM | POA: Diagnosis not present

## 2020-04-24 DIAGNOSIS — D631 Anemia in chronic kidney disease: Secondary | ICD-10-CM | POA: Diagnosis not present

## 2020-04-26 DIAGNOSIS — N2581 Secondary hyperparathyroidism of renal origin: Secondary | ICD-10-CM | POA: Diagnosis not present

## 2020-04-26 DIAGNOSIS — D631 Anemia in chronic kidney disease: Secondary | ICD-10-CM | POA: Diagnosis not present

## 2020-04-26 DIAGNOSIS — Z992 Dependence on renal dialysis: Secondary | ICD-10-CM | POA: Diagnosis not present

## 2020-04-26 DIAGNOSIS — N186 End stage renal disease: Secondary | ICD-10-CM | POA: Diagnosis not present

## 2020-04-29 DIAGNOSIS — N2581 Secondary hyperparathyroidism of renal origin: Secondary | ICD-10-CM | POA: Diagnosis not present

## 2020-04-29 DIAGNOSIS — N186 End stage renal disease: Secondary | ICD-10-CM | POA: Diagnosis not present

## 2020-04-29 DIAGNOSIS — D631 Anemia in chronic kidney disease: Secondary | ICD-10-CM | POA: Diagnosis not present

## 2020-04-29 DIAGNOSIS — Z992 Dependence on renal dialysis: Secondary | ICD-10-CM | POA: Diagnosis not present

## 2020-05-01 DIAGNOSIS — Z992 Dependence on renal dialysis: Secondary | ICD-10-CM | POA: Diagnosis not present

## 2020-05-01 DIAGNOSIS — N2581 Secondary hyperparathyroidism of renal origin: Secondary | ICD-10-CM | POA: Diagnosis not present

## 2020-05-01 DIAGNOSIS — D631 Anemia in chronic kidney disease: Secondary | ICD-10-CM | POA: Diagnosis not present

## 2020-05-01 DIAGNOSIS — N186 End stage renal disease: Secondary | ICD-10-CM | POA: Diagnosis not present

## 2020-05-03 DIAGNOSIS — N186 End stage renal disease: Secondary | ICD-10-CM | POA: Diagnosis not present

## 2020-05-03 DIAGNOSIS — Z992 Dependence on renal dialysis: Secondary | ICD-10-CM | POA: Diagnosis not present

## 2020-05-03 DIAGNOSIS — D631 Anemia in chronic kidney disease: Secondary | ICD-10-CM | POA: Diagnosis not present

## 2020-05-03 DIAGNOSIS — N2581 Secondary hyperparathyroidism of renal origin: Secondary | ICD-10-CM | POA: Diagnosis not present

## 2020-05-05 DIAGNOSIS — N186 End stage renal disease: Secondary | ICD-10-CM | POA: Diagnosis not present

## 2020-05-05 DIAGNOSIS — Z992 Dependence on renal dialysis: Secondary | ICD-10-CM | POA: Diagnosis not present

## 2020-05-05 DIAGNOSIS — I129 Hypertensive chronic kidney disease with stage 1 through stage 4 chronic kidney disease, or unspecified chronic kidney disease: Secondary | ICD-10-CM | POA: Diagnosis not present

## 2020-05-06 DIAGNOSIS — N2581 Secondary hyperparathyroidism of renal origin: Secondary | ICD-10-CM | POA: Diagnosis not present

## 2020-05-06 DIAGNOSIS — N186 End stage renal disease: Secondary | ICD-10-CM | POA: Diagnosis not present

## 2020-05-06 DIAGNOSIS — Z992 Dependence on renal dialysis: Secondary | ICD-10-CM | POA: Diagnosis not present

## 2020-05-06 DIAGNOSIS — D631 Anemia in chronic kidney disease: Secondary | ICD-10-CM | POA: Diagnosis not present

## 2020-05-08 DIAGNOSIS — Z992 Dependence on renal dialysis: Secondary | ICD-10-CM | POA: Diagnosis not present

## 2020-05-08 DIAGNOSIS — N186 End stage renal disease: Secondary | ICD-10-CM | POA: Diagnosis not present

## 2020-05-08 DIAGNOSIS — D631 Anemia in chronic kidney disease: Secondary | ICD-10-CM | POA: Diagnosis not present

## 2020-05-08 DIAGNOSIS — N2581 Secondary hyperparathyroidism of renal origin: Secondary | ICD-10-CM | POA: Diagnosis not present

## 2020-05-10 DIAGNOSIS — N2581 Secondary hyperparathyroidism of renal origin: Secondary | ICD-10-CM | POA: Diagnosis not present

## 2020-05-10 DIAGNOSIS — Z992 Dependence on renal dialysis: Secondary | ICD-10-CM | POA: Diagnosis not present

## 2020-05-10 DIAGNOSIS — N186 End stage renal disease: Secondary | ICD-10-CM | POA: Diagnosis not present

## 2020-05-10 DIAGNOSIS — D631 Anemia in chronic kidney disease: Secondary | ICD-10-CM | POA: Diagnosis not present

## 2020-05-12 DIAGNOSIS — D631 Anemia in chronic kidney disease: Secondary | ICD-10-CM | POA: Diagnosis not present

## 2020-05-12 DIAGNOSIS — N2581 Secondary hyperparathyroidism of renal origin: Secondary | ICD-10-CM | POA: Diagnosis not present

## 2020-05-12 DIAGNOSIS — N186 End stage renal disease: Secondary | ICD-10-CM | POA: Diagnosis not present

## 2020-05-12 DIAGNOSIS — Z992 Dependence on renal dialysis: Secondary | ICD-10-CM | POA: Diagnosis not present

## 2020-05-15 DIAGNOSIS — N2581 Secondary hyperparathyroidism of renal origin: Secondary | ICD-10-CM | POA: Diagnosis not present

## 2020-05-15 DIAGNOSIS — D631 Anemia in chronic kidney disease: Secondary | ICD-10-CM | POA: Diagnosis not present

## 2020-05-15 DIAGNOSIS — Z992 Dependence on renal dialysis: Secondary | ICD-10-CM | POA: Diagnosis not present

## 2020-05-15 DIAGNOSIS — N186 End stage renal disease: Secondary | ICD-10-CM | POA: Diagnosis not present

## 2020-05-17 DIAGNOSIS — Z992 Dependence on renal dialysis: Secondary | ICD-10-CM | POA: Diagnosis not present

## 2020-05-17 DIAGNOSIS — N2581 Secondary hyperparathyroidism of renal origin: Secondary | ICD-10-CM | POA: Diagnosis not present

## 2020-05-17 DIAGNOSIS — N186 End stage renal disease: Secondary | ICD-10-CM | POA: Diagnosis not present

## 2020-05-17 DIAGNOSIS — D631 Anemia in chronic kidney disease: Secondary | ICD-10-CM | POA: Diagnosis not present

## 2020-05-19 DIAGNOSIS — D631 Anemia in chronic kidney disease: Secondary | ICD-10-CM | POA: Diagnosis not present

## 2020-05-19 DIAGNOSIS — N186 End stage renal disease: Secondary | ICD-10-CM | POA: Diagnosis not present

## 2020-05-19 DIAGNOSIS — N2581 Secondary hyperparathyroidism of renal origin: Secondary | ICD-10-CM | POA: Diagnosis not present

## 2020-05-19 DIAGNOSIS — Z992 Dependence on renal dialysis: Secondary | ICD-10-CM | POA: Diagnosis not present

## 2020-05-22 DIAGNOSIS — N186 End stage renal disease: Secondary | ICD-10-CM | POA: Diagnosis not present

## 2020-05-22 DIAGNOSIS — N2581 Secondary hyperparathyroidism of renal origin: Secondary | ICD-10-CM | POA: Diagnosis not present

## 2020-05-22 DIAGNOSIS — D631 Anemia in chronic kidney disease: Secondary | ICD-10-CM | POA: Diagnosis not present

## 2020-05-22 DIAGNOSIS — Z992 Dependence on renal dialysis: Secondary | ICD-10-CM | POA: Diagnosis not present

## 2020-05-24 DIAGNOSIS — D631 Anemia in chronic kidney disease: Secondary | ICD-10-CM | POA: Diagnosis not present

## 2020-05-24 DIAGNOSIS — N186 End stage renal disease: Secondary | ICD-10-CM | POA: Diagnosis not present

## 2020-05-24 DIAGNOSIS — N2581 Secondary hyperparathyroidism of renal origin: Secondary | ICD-10-CM | POA: Diagnosis not present

## 2020-05-24 DIAGNOSIS — Z992 Dependence on renal dialysis: Secondary | ICD-10-CM | POA: Diagnosis not present

## 2020-05-26 DIAGNOSIS — D631 Anemia in chronic kidney disease: Secondary | ICD-10-CM | POA: Diagnosis not present

## 2020-05-26 DIAGNOSIS — Z992 Dependence on renal dialysis: Secondary | ICD-10-CM | POA: Diagnosis not present

## 2020-05-26 DIAGNOSIS — N2581 Secondary hyperparathyroidism of renal origin: Secondary | ICD-10-CM | POA: Diagnosis not present

## 2020-05-26 DIAGNOSIS — N186 End stage renal disease: Secondary | ICD-10-CM | POA: Diagnosis not present

## 2020-05-29 DIAGNOSIS — N186 End stage renal disease: Secondary | ICD-10-CM | POA: Diagnosis not present

## 2020-05-29 DIAGNOSIS — N2581 Secondary hyperparathyroidism of renal origin: Secondary | ICD-10-CM | POA: Diagnosis not present

## 2020-05-29 DIAGNOSIS — D631 Anemia in chronic kidney disease: Secondary | ICD-10-CM | POA: Diagnosis not present

## 2020-05-29 DIAGNOSIS — Z992 Dependence on renal dialysis: Secondary | ICD-10-CM | POA: Diagnosis not present

## 2020-05-31 DIAGNOSIS — Z992 Dependence on renal dialysis: Secondary | ICD-10-CM | POA: Diagnosis not present

## 2020-05-31 DIAGNOSIS — D631 Anemia in chronic kidney disease: Secondary | ICD-10-CM | POA: Diagnosis not present

## 2020-05-31 DIAGNOSIS — N186 End stage renal disease: Secondary | ICD-10-CM | POA: Diagnosis not present

## 2020-05-31 DIAGNOSIS — N2581 Secondary hyperparathyroidism of renal origin: Secondary | ICD-10-CM | POA: Diagnosis not present

## 2020-06-02 DIAGNOSIS — Z992 Dependence on renal dialysis: Secondary | ICD-10-CM | POA: Diagnosis not present

## 2020-06-02 DIAGNOSIS — D631 Anemia in chronic kidney disease: Secondary | ICD-10-CM | POA: Diagnosis not present

## 2020-06-02 DIAGNOSIS — N2581 Secondary hyperparathyroidism of renal origin: Secondary | ICD-10-CM | POA: Diagnosis not present

## 2020-06-02 DIAGNOSIS — N186 End stage renal disease: Secondary | ICD-10-CM | POA: Diagnosis not present

## 2020-06-05 DIAGNOSIS — N2581 Secondary hyperparathyroidism of renal origin: Secondary | ICD-10-CM | POA: Diagnosis not present

## 2020-06-05 DIAGNOSIS — N186 End stage renal disease: Secondary | ICD-10-CM | POA: Diagnosis not present

## 2020-06-05 DIAGNOSIS — Z992 Dependence on renal dialysis: Secondary | ICD-10-CM | POA: Diagnosis not present

## 2020-06-05 DIAGNOSIS — D631 Anemia in chronic kidney disease: Secondary | ICD-10-CM | POA: Diagnosis not present

## 2020-06-05 DIAGNOSIS — I129 Hypertensive chronic kidney disease with stage 1 through stage 4 chronic kidney disease, or unspecified chronic kidney disease: Secondary | ICD-10-CM | POA: Diagnosis not present

## 2020-06-07 DIAGNOSIS — N186 End stage renal disease: Secondary | ICD-10-CM | POA: Diagnosis not present

## 2020-06-07 DIAGNOSIS — N2581 Secondary hyperparathyroidism of renal origin: Secondary | ICD-10-CM | POA: Diagnosis not present

## 2020-06-07 DIAGNOSIS — Z992 Dependence on renal dialysis: Secondary | ICD-10-CM | POA: Diagnosis not present

## 2020-06-07 DIAGNOSIS — D631 Anemia in chronic kidney disease: Secondary | ICD-10-CM | POA: Diagnosis not present

## 2020-06-09 DIAGNOSIS — N186 End stage renal disease: Secondary | ICD-10-CM | POA: Diagnosis not present

## 2020-06-09 DIAGNOSIS — D631 Anemia in chronic kidney disease: Secondary | ICD-10-CM | POA: Diagnosis not present

## 2020-06-09 DIAGNOSIS — N2581 Secondary hyperparathyroidism of renal origin: Secondary | ICD-10-CM | POA: Diagnosis not present

## 2020-06-09 DIAGNOSIS — Z992 Dependence on renal dialysis: Secondary | ICD-10-CM | POA: Diagnosis not present

## 2020-06-12 DIAGNOSIS — Z992 Dependence on renal dialysis: Secondary | ICD-10-CM | POA: Diagnosis not present

## 2020-06-12 DIAGNOSIS — N2581 Secondary hyperparathyroidism of renal origin: Secondary | ICD-10-CM | POA: Diagnosis not present

## 2020-06-12 DIAGNOSIS — N186 End stage renal disease: Secondary | ICD-10-CM | POA: Diagnosis not present

## 2020-06-12 DIAGNOSIS — D631 Anemia in chronic kidney disease: Secondary | ICD-10-CM | POA: Diagnosis not present

## 2020-06-14 DIAGNOSIS — Z992 Dependence on renal dialysis: Secondary | ICD-10-CM | POA: Diagnosis not present

## 2020-06-14 DIAGNOSIS — N2581 Secondary hyperparathyroidism of renal origin: Secondary | ICD-10-CM | POA: Diagnosis not present

## 2020-06-14 DIAGNOSIS — N186 End stage renal disease: Secondary | ICD-10-CM | POA: Diagnosis not present

## 2020-06-14 DIAGNOSIS — D631 Anemia in chronic kidney disease: Secondary | ICD-10-CM | POA: Diagnosis not present

## 2020-06-16 DIAGNOSIS — N2581 Secondary hyperparathyroidism of renal origin: Secondary | ICD-10-CM | POA: Diagnosis not present

## 2020-06-16 DIAGNOSIS — Z992 Dependence on renal dialysis: Secondary | ICD-10-CM | POA: Diagnosis not present

## 2020-06-16 DIAGNOSIS — N186 End stage renal disease: Secondary | ICD-10-CM | POA: Diagnosis not present

## 2020-06-16 DIAGNOSIS — D631 Anemia in chronic kidney disease: Secondary | ICD-10-CM | POA: Diagnosis not present

## 2020-06-19 DIAGNOSIS — D631 Anemia in chronic kidney disease: Secondary | ICD-10-CM | POA: Diagnosis not present

## 2020-06-19 DIAGNOSIS — N2581 Secondary hyperparathyroidism of renal origin: Secondary | ICD-10-CM | POA: Diagnosis not present

## 2020-06-19 DIAGNOSIS — N186 End stage renal disease: Secondary | ICD-10-CM | POA: Diagnosis not present

## 2020-06-19 DIAGNOSIS — Z992 Dependence on renal dialysis: Secondary | ICD-10-CM | POA: Diagnosis not present

## 2020-06-21 DIAGNOSIS — N2581 Secondary hyperparathyroidism of renal origin: Secondary | ICD-10-CM | POA: Diagnosis not present

## 2020-06-21 DIAGNOSIS — Z992 Dependence on renal dialysis: Secondary | ICD-10-CM | POA: Diagnosis not present

## 2020-06-21 DIAGNOSIS — N186 End stage renal disease: Secondary | ICD-10-CM | POA: Diagnosis not present

## 2020-06-21 DIAGNOSIS — D631 Anemia in chronic kidney disease: Secondary | ICD-10-CM | POA: Diagnosis not present

## 2020-06-23 DIAGNOSIS — N2581 Secondary hyperparathyroidism of renal origin: Secondary | ICD-10-CM | POA: Diagnosis not present

## 2020-06-23 DIAGNOSIS — D631 Anemia in chronic kidney disease: Secondary | ICD-10-CM | POA: Diagnosis not present

## 2020-06-23 DIAGNOSIS — N186 End stage renal disease: Secondary | ICD-10-CM | POA: Diagnosis not present

## 2020-06-23 DIAGNOSIS — Z992 Dependence on renal dialysis: Secondary | ICD-10-CM | POA: Diagnosis not present

## 2020-06-26 DIAGNOSIS — D631 Anemia in chronic kidney disease: Secondary | ICD-10-CM | POA: Diagnosis not present

## 2020-06-26 DIAGNOSIS — N2581 Secondary hyperparathyroidism of renal origin: Secondary | ICD-10-CM | POA: Diagnosis not present

## 2020-06-26 DIAGNOSIS — N186 End stage renal disease: Secondary | ICD-10-CM | POA: Diagnosis not present

## 2020-06-26 DIAGNOSIS — Z992 Dependence on renal dialysis: Secondary | ICD-10-CM | POA: Diagnosis not present

## 2020-06-28 DIAGNOSIS — D631 Anemia in chronic kidney disease: Secondary | ICD-10-CM | POA: Diagnosis not present

## 2020-06-28 DIAGNOSIS — N2581 Secondary hyperparathyroidism of renal origin: Secondary | ICD-10-CM | POA: Diagnosis not present

## 2020-06-28 DIAGNOSIS — N186 End stage renal disease: Secondary | ICD-10-CM | POA: Diagnosis not present

## 2020-06-28 DIAGNOSIS — Z992 Dependence on renal dialysis: Secondary | ICD-10-CM | POA: Diagnosis not present

## 2020-06-30 DIAGNOSIS — D631 Anemia in chronic kidney disease: Secondary | ICD-10-CM | POA: Diagnosis not present

## 2020-06-30 DIAGNOSIS — N186 End stage renal disease: Secondary | ICD-10-CM | POA: Diagnosis not present

## 2020-06-30 DIAGNOSIS — Z992 Dependence on renal dialysis: Secondary | ICD-10-CM | POA: Diagnosis not present

## 2020-06-30 DIAGNOSIS — N2581 Secondary hyperparathyroidism of renal origin: Secondary | ICD-10-CM | POA: Diagnosis not present

## 2020-07-03 DIAGNOSIS — N2581 Secondary hyperparathyroidism of renal origin: Secondary | ICD-10-CM | POA: Diagnosis not present

## 2020-07-03 DIAGNOSIS — Z992 Dependence on renal dialysis: Secondary | ICD-10-CM | POA: Diagnosis not present

## 2020-07-03 DIAGNOSIS — I129 Hypertensive chronic kidney disease with stage 1 through stage 4 chronic kidney disease, or unspecified chronic kidney disease: Secondary | ICD-10-CM | POA: Diagnosis not present

## 2020-07-03 DIAGNOSIS — N186 End stage renal disease: Secondary | ICD-10-CM | POA: Diagnosis not present

## 2020-07-05 DIAGNOSIS — Z992 Dependence on renal dialysis: Secondary | ICD-10-CM | POA: Diagnosis not present

## 2020-07-05 DIAGNOSIS — N2581 Secondary hyperparathyroidism of renal origin: Secondary | ICD-10-CM | POA: Diagnosis not present

## 2020-07-05 DIAGNOSIS — N186 End stage renal disease: Secondary | ICD-10-CM | POA: Diagnosis not present

## 2020-07-07 DIAGNOSIS — N186 End stage renal disease: Secondary | ICD-10-CM | POA: Diagnosis not present

## 2020-07-07 DIAGNOSIS — Z992 Dependence on renal dialysis: Secondary | ICD-10-CM | POA: Diagnosis not present

## 2020-07-07 DIAGNOSIS — N2581 Secondary hyperparathyroidism of renal origin: Secondary | ICD-10-CM | POA: Diagnosis not present

## 2020-07-10 DIAGNOSIS — Z992 Dependence on renal dialysis: Secondary | ICD-10-CM | POA: Diagnosis not present

## 2020-07-10 DIAGNOSIS — N186 End stage renal disease: Secondary | ICD-10-CM | POA: Diagnosis not present

## 2020-07-10 DIAGNOSIS — N2581 Secondary hyperparathyroidism of renal origin: Secondary | ICD-10-CM | POA: Diagnosis not present

## 2020-07-12 DIAGNOSIS — N186 End stage renal disease: Secondary | ICD-10-CM | POA: Diagnosis not present

## 2020-07-12 DIAGNOSIS — N2581 Secondary hyperparathyroidism of renal origin: Secondary | ICD-10-CM | POA: Diagnosis not present

## 2020-07-12 DIAGNOSIS — Z992 Dependence on renal dialysis: Secondary | ICD-10-CM | POA: Diagnosis not present

## 2020-07-14 DIAGNOSIS — N186 End stage renal disease: Secondary | ICD-10-CM | POA: Diagnosis not present

## 2020-07-14 DIAGNOSIS — N2581 Secondary hyperparathyroidism of renal origin: Secondary | ICD-10-CM | POA: Diagnosis not present

## 2020-07-14 DIAGNOSIS — Z992 Dependence on renal dialysis: Secondary | ICD-10-CM | POA: Diagnosis not present

## 2020-07-17 DIAGNOSIS — N186 End stage renal disease: Secondary | ICD-10-CM | POA: Diagnosis not present

## 2020-07-17 DIAGNOSIS — Z992 Dependence on renal dialysis: Secondary | ICD-10-CM | POA: Diagnosis not present

## 2020-07-17 DIAGNOSIS — N2581 Secondary hyperparathyroidism of renal origin: Secondary | ICD-10-CM | POA: Diagnosis not present

## 2020-07-19 DIAGNOSIS — N186 End stage renal disease: Secondary | ICD-10-CM | POA: Diagnosis not present

## 2020-07-19 DIAGNOSIS — Z992 Dependence on renal dialysis: Secondary | ICD-10-CM | POA: Diagnosis not present

## 2020-07-19 DIAGNOSIS — N2581 Secondary hyperparathyroidism of renal origin: Secondary | ICD-10-CM | POA: Diagnosis not present

## 2020-07-21 DIAGNOSIS — N186 End stage renal disease: Secondary | ICD-10-CM | POA: Diagnosis not present

## 2020-07-21 DIAGNOSIS — Z992 Dependence on renal dialysis: Secondary | ICD-10-CM | POA: Diagnosis not present

## 2020-07-21 DIAGNOSIS — N2581 Secondary hyperparathyroidism of renal origin: Secondary | ICD-10-CM | POA: Diagnosis not present

## 2020-07-24 DIAGNOSIS — N2581 Secondary hyperparathyroidism of renal origin: Secondary | ICD-10-CM | POA: Diagnosis not present

## 2020-07-24 DIAGNOSIS — N186 End stage renal disease: Secondary | ICD-10-CM | POA: Diagnosis not present

## 2020-07-24 DIAGNOSIS — Z992 Dependence on renal dialysis: Secondary | ICD-10-CM | POA: Diagnosis not present

## 2020-07-26 DIAGNOSIS — N2581 Secondary hyperparathyroidism of renal origin: Secondary | ICD-10-CM | POA: Diagnosis not present

## 2020-07-26 DIAGNOSIS — N186 End stage renal disease: Secondary | ICD-10-CM | POA: Diagnosis not present

## 2020-07-26 DIAGNOSIS — Z992 Dependence on renal dialysis: Secondary | ICD-10-CM | POA: Diagnosis not present

## 2020-07-28 DIAGNOSIS — N186 End stage renal disease: Secondary | ICD-10-CM | POA: Diagnosis not present

## 2020-07-28 DIAGNOSIS — Z992 Dependence on renal dialysis: Secondary | ICD-10-CM | POA: Diagnosis not present

## 2020-07-28 DIAGNOSIS — N2581 Secondary hyperparathyroidism of renal origin: Secondary | ICD-10-CM | POA: Diagnosis not present

## 2020-07-31 DIAGNOSIS — N2581 Secondary hyperparathyroidism of renal origin: Secondary | ICD-10-CM | POA: Diagnosis not present

## 2020-07-31 DIAGNOSIS — Z992 Dependence on renal dialysis: Secondary | ICD-10-CM | POA: Diagnosis not present

## 2020-07-31 DIAGNOSIS — N186 End stage renal disease: Secondary | ICD-10-CM | POA: Diagnosis not present

## 2020-08-02 DIAGNOSIS — N186 End stage renal disease: Secondary | ICD-10-CM | POA: Diagnosis not present

## 2020-08-02 DIAGNOSIS — N2581 Secondary hyperparathyroidism of renal origin: Secondary | ICD-10-CM | POA: Diagnosis not present

## 2020-08-02 DIAGNOSIS — Z992 Dependence on renal dialysis: Secondary | ICD-10-CM | POA: Diagnosis not present

## 2020-08-03 DIAGNOSIS — Z992 Dependence on renal dialysis: Secondary | ICD-10-CM | POA: Diagnosis not present

## 2020-08-03 DIAGNOSIS — N186 End stage renal disease: Secondary | ICD-10-CM | POA: Diagnosis not present

## 2020-08-03 DIAGNOSIS — I129 Hypertensive chronic kidney disease with stage 1 through stage 4 chronic kidney disease, or unspecified chronic kidney disease: Secondary | ICD-10-CM | POA: Diagnosis not present

## 2020-08-04 ENCOUNTER — Other Ambulatory Visit: Payer: Self-pay | Admitting: Cardiovascular Disease

## 2020-08-04 DIAGNOSIS — D509 Iron deficiency anemia, unspecified: Secondary | ICD-10-CM | POA: Diagnosis not present

## 2020-08-04 DIAGNOSIS — N2581 Secondary hyperparathyroidism of renal origin: Secondary | ICD-10-CM | POA: Diagnosis not present

## 2020-08-04 DIAGNOSIS — Z992 Dependence on renal dialysis: Secondary | ICD-10-CM | POA: Diagnosis not present

## 2020-08-04 DIAGNOSIS — N186 End stage renal disease: Secondary | ICD-10-CM | POA: Diagnosis not present

## 2020-08-07 DIAGNOSIS — N2581 Secondary hyperparathyroidism of renal origin: Secondary | ICD-10-CM | POA: Diagnosis not present

## 2020-08-07 DIAGNOSIS — N186 End stage renal disease: Secondary | ICD-10-CM | POA: Diagnosis not present

## 2020-08-07 DIAGNOSIS — D509 Iron deficiency anemia, unspecified: Secondary | ICD-10-CM | POA: Diagnosis not present

## 2020-08-07 DIAGNOSIS — Z992 Dependence on renal dialysis: Secondary | ICD-10-CM | POA: Diagnosis not present

## 2020-08-09 DIAGNOSIS — D509 Iron deficiency anemia, unspecified: Secondary | ICD-10-CM | POA: Diagnosis not present

## 2020-08-09 DIAGNOSIS — N186 End stage renal disease: Secondary | ICD-10-CM | POA: Diagnosis not present

## 2020-08-09 DIAGNOSIS — N2581 Secondary hyperparathyroidism of renal origin: Secondary | ICD-10-CM | POA: Diagnosis not present

## 2020-08-09 DIAGNOSIS — Z992 Dependence on renal dialysis: Secondary | ICD-10-CM | POA: Diagnosis not present

## 2020-08-10 ENCOUNTER — Ambulatory Visit: Payer: Medicare Other | Admitting: Physician Assistant

## 2020-08-11 DIAGNOSIS — D509 Iron deficiency anemia, unspecified: Secondary | ICD-10-CM | POA: Diagnosis not present

## 2020-08-11 DIAGNOSIS — N186 End stage renal disease: Secondary | ICD-10-CM | POA: Diagnosis not present

## 2020-08-11 DIAGNOSIS — N2581 Secondary hyperparathyroidism of renal origin: Secondary | ICD-10-CM | POA: Diagnosis not present

## 2020-08-11 DIAGNOSIS — Z992 Dependence on renal dialysis: Secondary | ICD-10-CM | POA: Diagnosis not present

## 2020-08-14 DIAGNOSIS — Z992 Dependence on renal dialysis: Secondary | ICD-10-CM | POA: Diagnosis not present

## 2020-08-14 DIAGNOSIS — N2581 Secondary hyperparathyroidism of renal origin: Secondary | ICD-10-CM | POA: Diagnosis not present

## 2020-08-14 DIAGNOSIS — N186 End stage renal disease: Secondary | ICD-10-CM | POA: Diagnosis not present

## 2020-08-14 DIAGNOSIS — D509 Iron deficiency anemia, unspecified: Secondary | ICD-10-CM | POA: Diagnosis not present

## 2020-08-16 DIAGNOSIS — N2581 Secondary hyperparathyroidism of renal origin: Secondary | ICD-10-CM | POA: Diagnosis not present

## 2020-08-16 DIAGNOSIS — D509 Iron deficiency anemia, unspecified: Secondary | ICD-10-CM | POA: Diagnosis not present

## 2020-08-16 DIAGNOSIS — N186 End stage renal disease: Secondary | ICD-10-CM | POA: Diagnosis not present

## 2020-08-16 DIAGNOSIS — Z992 Dependence on renal dialysis: Secondary | ICD-10-CM | POA: Diagnosis not present

## 2020-08-18 DIAGNOSIS — Z992 Dependence on renal dialysis: Secondary | ICD-10-CM | POA: Diagnosis not present

## 2020-08-18 DIAGNOSIS — N186 End stage renal disease: Secondary | ICD-10-CM | POA: Diagnosis not present

## 2020-08-18 DIAGNOSIS — D509 Iron deficiency anemia, unspecified: Secondary | ICD-10-CM | POA: Diagnosis not present

## 2020-08-18 DIAGNOSIS — N2581 Secondary hyperparathyroidism of renal origin: Secondary | ICD-10-CM | POA: Diagnosis not present

## 2020-08-21 DIAGNOSIS — D509 Iron deficiency anemia, unspecified: Secondary | ICD-10-CM | POA: Diagnosis not present

## 2020-08-21 DIAGNOSIS — N186 End stage renal disease: Secondary | ICD-10-CM | POA: Diagnosis not present

## 2020-08-21 DIAGNOSIS — Z992 Dependence on renal dialysis: Secondary | ICD-10-CM | POA: Diagnosis not present

## 2020-08-21 DIAGNOSIS — N2581 Secondary hyperparathyroidism of renal origin: Secondary | ICD-10-CM | POA: Diagnosis not present

## 2020-08-23 DIAGNOSIS — N186 End stage renal disease: Secondary | ICD-10-CM | POA: Diagnosis not present

## 2020-08-23 DIAGNOSIS — N2581 Secondary hyperparathyroidism of renal origin: Secondary | ICD-10-CM | POA: Diagnosis not present

## 2020-08-23 DIAGNOSIS — D509 Iron deficiency anemia, unspecified: Secondary | ICD-10-CM | POA: Diagnosis not present

## 2020-08-23 DIAGNOSIS — Z992 Dependence on renal dialysis: Secondary | ICD-10-CM | POA: Diagnosis not present

## 2020-08-25 DIAGNOSIS — N2581 Secondary hyperparathyroidism of renal origin: Secondary | ICD-10-CM | POA: Diagnosis not present

## 2020-08-25 DIAGNOSIS — Z992 Dependence on renal dialysis: Secondary | ICD-10-CM | POA: Diagnosis not present

## 2020-08-25 DIAGNOSIS — D509 Iron deficiency anemia, unspecified: Secondary | ICD-10-CM | POA: Diagnosis not present

## 2020-08-25 DIAGNOSIS — N186 End stage renal disease: Secondary | ICD-10-CM | POA: Diagnosis not present

## 2020-08-28 DIAGNOSIS — D509 Iron deficiency anemia, unspecified: Secondary | ICD-10-CM | POA: Diagnosis not present

## 2020-08-28 DIAGNOSIS — U071 COVID-19: Secondary | ICD-10-CM | POA: Insufficient documentation

## 2020-08-28 DIAGNOSIS — N186 End stage renal disease: Secondary | ICD-10-CM | POA: Diagnosis not present

## 2020-08-28 DIAGNOSIS — Z992 Dependence on renal dialysis: Secondary | ICD-10-CM | POA: Diagnosis not present

## 2020-08-28 DIAGNOSIS — N2581 Secondary hyperparathyroidism of renal origin: Secondary | ICD-10-CM | POA: Diagnosis not present

## 2020-08-28 NOTE — Progress Notes (Signed)
Cardiology Clinic Note   Patient Name: Yvonne Powell Date of Encounter: 08/29/2020  Primary Care Provider:  Velna Hatchet, MD Primary Cardiologist:  Skeet Latch, MD  Patient Profile    Yvonne Powell 87 year old female presents today for a follow-up of her acute on chronic diastolic heart failure, HTN, and demand ischemia.  Past Medical History    Past Medical History:  Diagnosis Date  . Anemia    due to kidney  . Chronic diastolic heart failure ([facility]) 09/08/2019  . DVT (deep venous thrombosis) ([facility])    hx  . Gallstones 12/2018  . Hypertension   . Renal disorder   . Thyroid disease    graves dx   Past Surgical History:  Procedure Laterality Date  . AV FISTULA PLACEMENT Right 04/07/2017   Procedure: ARTERIOVENOUS (AV) FISTULA CREATION;  Surgeon: Elam Dutch, MD;  Location: [facility];  Service: Vascular;  Laterality: Right;  . CHOLECYSTECTOMY N/A 12/23/2018   Procedure: LAPAROSCOPIC CHOLECYSTECTOMY;  Surgeon: Erroll Luna, MD;  Location: [facility];  Service: General;  Laterality: N/A;  . COLONOSCOPY    . DILATION AND CURETTAGE OF UTERUS    . EYE SURGERY     cactaracts  . LIPOMA EXCISION    . LUMBAR EPIDURAL INJECTION  2018        History of Present Illness    Yvonne Powell has a PMH of HTN, CKD 5, hypothyroidism, prior DVT, and chronic anemia. She was admitted to the hospital with acute on chronic shortness of breath and lower extremity edema. She has chronic kidney disease and had a fistula placed 12/18. Her echocardiogram 09/08/2019 showed an ejection fraction of 50-55%, G1 DD, moderately dilated left atria, mild aortic sclerosis with no stenosis, and small pericardial effusion. She was admitted to the hospital on 09/08/2019-09/11/2019.  She presentedto the clinic 5/21/21for follow-up evaluation statedshewasback to her normal daily activities. However shewasfairly sedentary due to knee pain. She statedshe followeda low-sodium and renal diet. She  hadnoticed that her blood pressure hadbeen slightly elevated at home in the 140s over 60s. She hadbeen weighing herself daily but statedthat she weighs herself before she urinates. I educated about getting weight after urination. I will increase her diltiazem to 360 mg daily, give her the salty 6 information sheet, give her blood pressure log and have her return in 1 month.  She presented to the clinic 10/26/2019 for follow-up evaluation and statedshe had noticed since starting dialysis that her blood pressure is much lower in the 100s to 110s over 60s. It made her feel very fatigued and lethargic. She stated that dialysis had been a major adjustment for her and her first day was not pleasant. I decreased her diltiazem to 240 mg daily and planned follow-up in 1 month.  She presented to the clinic 11/30/2019 for follow-up evaluation and stated she was doing fairly well.  She was adjusting to her Tuesday Thursday Saturday hemodialysis schedule.  She stated that she had to have 2 separate iron infusions.  Her first iron infusion was in IV gtt. and secondary infusion was run through her dialysis machine.  She stated that she did not feel quite normal after her second iron infusion.  She stated that her diltiazem was stopped and she was put on verapamil.  Her blood pressures had been well controlled in the 120s and 130s over 60s.  I planned her follow-up with Dr. Oval Linsey in 3 months.  She was seen by Dr. Oval Linsey on 02/27/2020 during that time she continued to  be stable.  She continued to walk for exercise.  She denied exertional chest pain, lower extremity swelling, and shortness of breath.  She reported that her cholesterol has always been elevated and that her sister also had elevated cholesterol.  She plans to meet with a nutritionist.  She presents the clinic today for follow-up evaluation states she feels well.  She reports that she is now fairly used to HD and that is been going well.  She does  note that she occasionally has low blood pressures at dialysis.  She has on occasion had to stop treatments and received saline.  She reports that she has gained a little bit of weight however, she reports that her appetite is better and that the food has been tasting better.  She does note increased fatigue since she has started dialysis which limits her physical activity.  She reports that her son who lives with her currently has COVID but is receiving antivirals and steroids.  She has been quarantining away from them.  I will have her follow-up in 6 months, increase her physical activity slowly as tolerated, and continue her current diet.  Today shedenies chest pain, shortness of breath, lower extremity edema, fatigue, palpitations, melena, hematuria, hemoptysis, diaphoresis, weakness, presyncope, syncope, orthopnea, and PND.  Home Medications    Prior to Admission medications   Medication Sig Start Date End Date Taking? Authorizing Provider  Iron-FA-DSS-B Cmplx-Vit C (NEPHRON FA) TABS  02/23/20   [provider]  levothyroxine (SYNTHROID, LEVOTHROID) 88 MCG tablet Take 88 mcg by mouth daily before breakfast.    [provider]  verapamil (CALAN-SR) 180 MG CR tablet TAKE 1 TABLET BY MOUTH AT BEDTIME. 08/06/20   Skeet Latch, MD    Family History    Family History  Problem Relation Age of Onset  . Congestive Heart Failure Maternal Grandmother 76  . Hypertension Maternal Grandmother    She indicated that her maternal grandmother is deceased.  Social History    Social History   Socioeconomic History  . Marital status: Divorced    Spouse name: Not on file  . Number of children: Not on file  . Years of education: Not on file  . Highest education level: Not on file  Occupational History  . Not on file  Tobacco Use  . Smoking status: Never Smoker  . Smokeless tobacco: Never Used  Vaping Use  . Vaping Use: Never used  Substance and Sexual Activity  . Alcohol  use: No  . Drug use: No  . Sexual activity: Not on file  Other Topics Concern  . Not on file  Social History Narrative  . Not on file   Social Determinants of Health   Financial Resource Strain: Not on file  Food Insecurity: Not on file  Transportation Needs: Not on file  Physical Activity: Not on file  Stress: Not on file  Social Connections: Not on file  Intimate Partner Violence: Not on file     Review of Systems    General:  No chills, fever, night sweats or weight changes.  Cardiovascular:  No chest pain, dyspnea on exertion, edema, orthopnea, palpitations, paroxysmal nocturnal dyspnea. Dermatological: No rash, lesions/masses Respiratory: No cough, dyspnea Urologic: No hematuria, dysuria Abdominal:   No nausea, vomiting, diarrhea, bright red blood per rectum, melena, or hematemesis Neurologic:  No visual changes, wkns, changes in mental status. All other systems reviewed and are otherwise negative except as noted above.  Physical Exam    VS:  BP Marland Kitchen)  148/62 (BP Location: Left Arm, Patient Position: Sitting, Cuff Size: Normal)   Pulse 81   Ht 5' 3.75" (1.619 m)   Wt 117 lb 3.2 oz (53.2 kg)   BMI 20.28 kg/m  , BMI Body mass index is 20.28 kg/m. GEN: Well nourished, well developed, in no acute distress. HEENT: normal. Neck: Supple, no JVD, carotid bruits, or masses. Cardiac: RRR, no murmurs, rubs, or gallops. No clubbing, cyanosis, edema.  Radials/DP/PT 2+ and equal bilaterally.  Respiratory:  Respirations regular and unlabored, clear to auscultation bilaterally. GI: Soft, nontender, nondistended, BS + x 4. MS: no deformity or atrophy. Skin: warm and dry, no rash. Neuro:  Strength and sensation are intact. Psych: Normal affect.  Accessory Clinical Findings    Recent Labs: 09/08/2019: B Natriuretic Peptide 1,601.8 09/11/2019: BUN 72; Creatinine, Ser 4.92; Platelets 188; Potassium 4.0; Sodium 144 10/11/2019: Hemoglobin 11.2   Recent Lipid Panel No results found for:  CHOL, TRIG, HDL, CHOLHDL, VLDL, LDLCALC, LDLDIRECT  ECG personally reviewed by me today-normal sinus rhythm left bundle branch block 81 bpm- No acute changes  Echocardiogram 09/08/2019 1. Left ventricular ejection fraction, by estimation, is 50 to 55%. The  left ventricle has low normal function. Septal-lateral dyssynchrony  consistent with LBBB. There is moderate left ventricular hypertrophy. Left ventricular diastolic parameters are consistent with Grade I diastolic dysfunction (impaired relaxation).  2. Right ventricular systolic function is normal. The right ventricular  size is normal. Tricuspid regurgitation signal is inadequate for assessing PA pressure.  3. Left atrial size was moderately dilated.  4. The mitral valve is degenerative. Mild mitral valve regurgitation. No  evidence of mitral stenosis.  5. The aortic valve is tricuspid. Aortic valve regurgitation is not  visualized. Mild aortic valve sclerosis is present, with no evidence of  aortic valve stenosis.  6. The inferior vena cava is dilated in size with <50% respiratory variability, suggesting right atrial pressure of 15 mmHg.  7. Small circumferential pericardial effusion, no evidence for tamponade. I do not think that the dilated IVC is related to the pericardial effusion.  Assessment & Plan   1.  Acute on chronic diastolic CHF- no increased DOE or activity tolerance.  Echocardiogram showed LVEF 50-55%, G1 DD and septal lateral dyssynchrony.  Heart healthy low-sodium diet-salty 6 given Daily weights Elevate extremities when not active  Essential hypertension-BP today 148/62.  Blood pressure well controlled120s-130s over 60s at home.    Somewhat labile with HD.  Reports occasionally having to end treatments and received saline boluses. Continue to monitor Heart healthy low-sodium diet-salty 6 given Increase physical activity as tolerated  Demand ischemia- denies chest pain.  Normal systolic function shown  on 09/08/2019 echocardiogram. Continue to monitor  Acute hypoxic respiratory failure due to CHF- continues on room air. No increased dyspnea. On HD and reports compliance. Heart healthy low-sodium diet Daily weights  Acute kidney injury/stage V CKD-Baseline creatinine 3.6-3.7. Continue HD Followed by nephrology  Disposition: Follow-up withDr. Lance Sell 6 months.   Jossie Ng. Chareese Sergent NP-C    08/29/2020, 11:18 AM [place] [address] Office [phone] Fax [phone]  Notice: This dictation was prepared with Dragon dictation along with smaller phrase technology. Any transcriptional errors that result from this process are unintentional and may not be corrected upon review.  I spent 14 minutes examining this patient, reviewing medications, and using patient centered shared decision making involving her cardiac care.  Prior to her visit I spent greater than 20 minutes reviewing her past  medical history,  medications, and prior cardiac tests.

## 2020-08-29 ENCOUNTER — Other Ambulatory Visit: Payer: Self-pay

## 2020-08-29 ENCOUNTER — Ambulatory Visit (INDEPENDENT_AMBULATORY_CARE_PROVIDER_SITE_OTHER): Payer: Medicare Other | Admitting: General Practice

## 2020-08-29 ENCOUNTER — Encounter: Payer: Self-pay | Admitting: General Practice

## 2020-08-29 VITALS — BP 148/62 | HR 81 | Ht 63.75 in | Wt 117.2 lb

## 2020-08-29 DIAGNOSIS — I248 Other forms of acute ischemic heart disease: Secondary | ICD-10-CM

## 2020-08-29 DIAGNOSIS — I5033 Acute on chronic diastolic (congestive) heart failure: Secondary | ICD-10-CM

## 2020-08-29 DIAGNOSIS — I2489 Other forms of acute ischemic heart disease: Secondary | ICD-10-CM

## 2020-08-29 DIAGNOSIS — I10 Essential (primary) hypertension: Secondary | ICD-10-CM | POA: Diagnosis not present

## 2020-08-29 DIAGNOSIS — N179 Acute kidney failure, unspecified: Secondary | ICD-10-CM | POA: Diagnosis not present

## 2020-08-29 DIAGNOSIS — J9601 Acute respiratory failure with hypoxia: Secondary | ICD-10-CM | POA: Diagnosis not present

## 2020-08-29 NOTE — Patient Instructions (Signed)
Medication Instructions:  The current medical regimen is effective;  continue present plan and medications as directed. Please refer to the Current Medication list given to you today. *If you need a refill on your cardiac medications before your next appointment, please call your pharmacy*  Lab Work:   Testing/Procedures:  NONE    NONE  Special Instructions PLEASE CONTINUE YOU DIALYSIS DIET  PLEASE INCREASE YOUR PHYSICAL ACTIVITY SLOWLY AS [place]: Your next appointment:  6 month(s) In Person with Skeet Latch, MD OR IF UNAVAILABLE JESSE CLEAVER, FNP-C   Please call our office 2 months in advance to schedule this appointment   At [facility], you and your health needs are our priority.  As part of our continuing mission to provide you with exceptional heart care, we have created designated Provider Care Teams.  These Care Teams include your primary Cardiologist (physician) and Advanced Practice Providers (APPs -  Physician Assistants and Nurse Practitioners) who all work together to provide you with the care you need, when you need it.  We recommend signing up for the patient portal called "MyChart".  Sign up information is provided on this After Visit Summary.  MyChart is used to connect with patients for Virtual Visits (Telemedicine).  Patients are able to view lab/test results, encounter notes, upcoming appointments, etc.  Non-urgent messages can be sent to your provider as well.   To learn more about what you can do with MyChart, go to [email or web address].

## 2020-08-30 DIAGNOSIS — D509 Iron deficiency anemia, unspecified: Secondary | ICD-10-CM | POA: Diagnosis not present

## 2020-08-30 DIAGNOSIS — N186 End stage renal disease: Secondary | ICD-10-CM | POA: Diagnosis not present

## 2020-08-30 DIAGNOSIS — N2581 Secondary hyperparathyroidism of renal origin: Secondary | ICD-10-CM | POA: Diagnosis not present

## 2020-08-30 DIAGNOSIS — Z992 Dependence on renal dialysis: Secondary | ICD-10-CM | POA: Diagnosis not present

## 2020-09-01 DIAGNOSIS — Z992 Dependence on renal dialysis: Secondary | ICD-10-CM | POA: Diagnosis not present

## 2020-09-01 DIAGNOSIS — D509 Iron deficiency anemia, unspecified: Secondary | ICD-10-CM | POA: Diagnosis not present

## 2020-09-01 DIAGNOSIS — N186 End stage renal disease: Secondary | ICD-10-CM | POA: Diagnosis not present

## 2020-09-01 DIAGNOSIS — N2581 Secondary hyperparathyroidism of renal origin: Secondary | ICD-10-CM | POA: Diagnosis not present

## 2020-09-02 DIAGNOSIS — N186 End stage renal disease: Secondary | ICD-10-CM | POA: Diagnosis not present

## 2020-09-02 DIAGNOSIS — Z992 Dependence on renal dialysis: Secondary | ICD-10-CM | POA: Diagnosis not present

## 2020-09-02 DIAGNOSIS — I129 Hypertensive chronic kidney disease with stage 1 through stage 4 chronic kidney disease, or unspecified chronic kidney disease: Secondary | ICD-10-CM | POA: Diagnosis not present

## 2020-09-04 DIAGNOSIS — D509 Iron deficiency anemia, unspecified: Secondary | ICD-10-CM | POA: Diagnosis not present

## 2020-09-04 DIAGNOSIS — N2581 Secondary hyperparathyroidism of renal origin: Secondary | ICD-10-CM | POA: Diagnosis not present

## 2020-09-04 DIAGNOSIS — N186 End stage renal disease: Secondary | ICD-10-CM | POA: Diagnosis not present

## 2020-09-04 DIAGNOSIS — T7840XA Allergy, unspecified, initial encounter: Secondary | ICD-10-CM | POA: Diagnosis not present

## 2020-09-04 DIAGNOSIS — Z992 Dependence on renal dialysis: Secondary | ICD-10-CM | POA: Diagnosis not present

## 2020-09-06 DIAGNOSIS — D509 Iron deficiency anemia, unspecified: Secondary | ICD-10-CM | POA: Diagnosis not present

## 2020-09-06 DIAGNOSIS — N2581 Secondary hyperparathyroidism of renal origin: Secondary | ICD-10-CM | POA: Diagnosis not present

## 2020-09-06 DIAGNOSIS — T7840XA Allergy, unspecified, initial encounter: Secondary | ICD-10-CM | POA: Diagnosis not present

## 2020-09-06 DIAGNOSIS — Z992 Dependence on renal dialysis: Secondary | ICD-10-CM | POA: Diagnosis not present

## 2020-09-06 DIAGNOSIS — N186 End stage renal disease: Secondary | ICD-10-CM | POA: Diagnosis not present

## 2020-09-08 DIAGNOSIS — N2581 Secondary hyperparathyroidism of renal origin: Secondary | ICD-10-CM | POA: Diagnosis not present

## 2020-09-08 DIAGNOSIS — Z992 Dependence on renal dialysis: Secondary | ICD-10-CM | POA: Diagnosis not present

## 2020-09-08 DIAGNOSIS — T7840XA Allergy, unspecified, initial encounter: Secondary | ICD-10-CM | POA: Diagnosis not present

## 2020-09-08 DIAGNOSIS — N186 End stage renal disease: Secondary | ICD-10-CM | POA: Diagnosis not present

## 2020-09-08 DIAGNOSIS — D509 Iron deficiency anemia, unspecified: Secondary | ICD-10-CM | POA: Diagnosis not present

## 2020-09-11 DIAGNOSIS — N2581 Secondary hyperparathyroidism of renal origin: Secondary | ICD-10-CM | POA: Diagnosis not present

## 2020-09-11 DIAGNOSIS — D509 Iron deficiency anemia, unspecified: Secondary | ICD-10-CM | POA: Diagnosis not present

## 2020-09-11 DIAGNOSIS — N186 End stage renal disease: Secondary | ICD-10-CM | POA: Diagnosis not present

## 2020-09-11 DIAGNOSIS — T7840XA Allergy, unspecified, initial encounter: Secondary | ICD-10-CM | POA: Diagnosis not present

## 2020-09-11 DIAGNOSIS — Z992 Dependence on renal dialysis: Secondary | ICD-10-CM | POA: Diagnosis not present

## 2020-09-13 DIAGNOSIS — D509 Iron deficiency anemia, unspecified: Secondary | ICD-10-CM | POA: Diagnosis not present

## 2020-09-13 DIAGNOSIS — Z992 Dependence on renal dialysis: Secondary | ICD-10-CM | POA: Diagnosis not present

## 2020-09-13 DIAGNOSIS — N2581 Secondary hyperparathyroidism of renal origin: Secondary | ICD-10-CM | POA: Diagnosis not present

## 2020-09-13 DIAGNOSIS — T7840XA Allergy, unspecified, initial encounter: Secondary | ICD-10-CM | POA: Diagnosis not present

## 2020-09-13 DIAGNOSIS — N186 End stage renal disease: Secondary | ICD-10-CM | POA: Diagnosis not present

## 2020-09-15 DIAGNOSIS — Z992 Dependence on renal dialysis: Secondary | ICD-10-CM | POA: Diagnosis not present

## 2020-09-15 DIAGNOSIS — N186 End stage renal disease: Secondary | ICD-10-CM | POA: Diagnosis not present

## 2020-09-15 DIAGNOSIS — D509 Iron deficiency anemia, unspecified: Secondary | ICD-10-CM | POA: Diagnosis not present

## 2020-09-15 DIAGNOSIS — N2581 Secondary hyperparathyroidism of renal origin: Secondary | ICD-10-CM | POA: Diagnosis not present

## 2020-09-15 DIAGNOSIS — T7840XA Allergy, unspecified, initial encounter: Secondary | ICD-10-CM | POA: Diagnosis not present

## 2020-09-18 DIAGNOSIS — N2581 Secondary hyperparathyroidism of renal origin: Secondary | ICD-10-CM | POA: Diagnosis not present

## 2020-09-18 DIAGNOSIS — Z992 Dependence on renal dialysis: Secondary | ICD-10-CM | POA: Diagnosis not present

## 2020-09-18 DIAGNOSIS — N186 End stage renal disease: Secondary | ICD-10-CM | POA: Diagnosis not present

## 2020-09-18 DIAGNOSIS — D509 Iron deficiency anemia, unspecified: Secondary | ICD-10-CM | POA: Diagnosis not present

## 2020-09-18 DIAGNOSIS — T7840XA Allergy, unspecified, initial encounter: Secondary | ICD-10-CM | POA: Diagnosis not present

## 2020-09-19 DIAGNOSIS — E785 Hyperlipidemia, unspecified: Secondary | ICD-10-CM | POA: Diagnosis not present

## 2020-09-19 DIAGNOSIS — E039 Hypothyroidism, unspecified: Secondary | ICD-10-CM | POA: Diagnosis not present

## 2020-09-20 ENCOUNTER — Other Ambulatory Visit: Payer: Self-pay

## 2020-09-20 ENCOUNTER — Encounter (HOSPITAL_COMMUNITY): Payer: Self-pay

## 2020-09-20 ENCOUNTER — Emergency Department (HOSPITAL_COMMUNITY)
Admission: EM | Admit: 2020-09-20 | Discharge: 2020-09-20 | Disposition: A | Discharge status: Home / Self Care | Payer: Medicare Other | Attending: Emergency Medicine | Admitting: Emergency Medicine

## 2020-09-20 DIAGNOSIS — Z79899 Other long term (current) drug therapy: Secondary | ICD-10-CM | POA: Diagnosis not present

## 2020-09-20 DIAGNOSIS — Z992 Dependence on renal dialysis: Secondary | ICD-10-CM | POA: Diagnosis not present

## 2020-09-20 DIAGNOSIS — I10 Essential (primary) hypertension: Secondary | ICD-10-CM | POA: Diagnosis not present

## 2020-09-20 DIAGNOSIS — N186 End stage renal disease: Secondary | ICD-10-CM | POA: Insufficient documentation

## 2020-09-20 DIAGNOSIS — T7840XA Allergy, unspecified, initial encounter: Secondary | ICD-10-CM | POA: Diagnosis not present

## 2020-09-20 DIAGNOSIS — L299 Pruritus, unspecified: Secondary | ICD-10-CM | POA: Insufficient documentation

## 2020-09-20 DIAGNOSIS — E039 Hypothyroidism, unspecified: Secondary | ICD-10-CM | POA: Insufficient documentation

## 2020-09-20 DIAGNOSIS — D509 Iron deficiency anemia, unspecified: Secondary | ICD-10-CM | POA: Diagnosis not present

## 2020-09-20 DIAGNOSIS — I132 Hypertensive heart and chronic kidney disease with heart failure and with stage 5 chronic kidney disease, or end stage renal disease: Secondary | ICD-10-CM | POA: Diagnosis not present

## 2020-09-20 DIAGNOSIS — I5032 Chronic diastolic (congestive) heart failure: Secondary | ICD-10-CM | POA: Diagnosis not present

## 2020-09-20 DIAGNOSIS — N2581 Secondary hyperparathyroidism of renal origin: Secondary | ICD-10-CM | POA: Diagnosis not present

## 2020-09-20 DIAGNOSIS — T50905A Adverse effect of unspecified drugs, medicaments and biological substances, initial encounter: Secondary | ICD-10-CM

## 2020-09-20 LAB — CBC WITH DIFFERENTIAL/PLATELET
Abs Immature Granulocytes: 0.01 10*3/uL (ref 0.00–0.07)
Basophils Absolute: 0.1 10*3/uL (ref 0.0–0.1)
Basophils Relative: 1 %
Eosinophils Absolute: 0.1 10*3/uL (ref 0.0–0.5)
Eosinophils Relative: 1 %
HCT: 32.7 % — ABNORMAL LOW (ref 36.0–46.0)
Hemoglobin: 10.7 g/dL — ABNORMAL LOW (ref 12.0–15.0)
Immature Granulocytes: 0 %
Lymphocytes Relative: 37 %
Lymphs Abs: 1.8 10*3/uL (ref 0.7–4.0)
MCH: 32.2 pg (ref 26.0–34.0)
MCHC: 32.7 g/dL (ref 30.0–36.0)
MCV: 98.5 fL (ref 80.0–100.0)
Monocytes Absolute: 0.4 10*3/uL (ref 0.1–1.0)
Monocytes Relative: 9 %
Neutro Abs: 2.6 10*3/uL (ref 1.7–7.7)
Neutrophils Relative %: 52 %
Platelets: 198 10*3/uL (ref 150–400)
RBC: 3.32 MIL/uL — ABNORMAL LOW (ref 3.87–5.11)
RDW: 15.9 % — ABNORMAL HIGH (ref 11.5–15.5)
WBC: 5 10*3/uL (ref 4.0–10.5)
nRBC: 0 % (ref 0.0–0.2)

## 2020-09-20 LAB — BASIC METABOLIC PANEL
Anion gap: 12 (ref 5–15)
BUN: 11 mg/dL (ref 8–23)
CO2: 31 mmol/L (ref 22–32)
Calcium: 8.5 mg/dL — ABNORMAL LOW (ref 8.9–10.3)
Chloride: 93 mmol/L — ABNORMAL LOW (ref 98–111)
Creatinine, Ser: 2.59 mg/dL — ABNORMAL HIGH (ref 0.44–1.00)
GFR, Estimated: 17 mL/min — ABNORMAL LOW (ref >60)
Glucose, Bld: 91 mg/dL (ref 70–99)
Potassium: 3.2 mmol/L — ABNORMAL LOW (ref 3.5–5.1)
Sodium: 136 mmol/L (ref 135–145)

## 2020-09-20 MED ORDER — METHYLPREDNISOLONE 4 MG PO TBPK: ORAL_TABLET | ORAL | 0 refills | Status: DC

## 2020-09-20 NOTE — ED Provider Notes (Signed)
Patient taken in at shift handoff with c/o allergic rxn to calcitriol given at dialysis today. Got benadryl pta for sensation for throat tightness. Fully resolved. No other interventions. No si/sx of anaphylactic shock. Plan labs and obs.   EKG Interpretation  Date/Time:  Thursday Sep 20 2020 15:04:16 EDT Ventricular Rate:  83 PR Interval:  220 QRS Duration: 144 QT Interval:  463 QTC Calculation: 545 R Axis:   69 Text Interpretation: Sinus rhythm Prolonged PR interval IVCD, consider atypical LBBB similar to May 2021 Confirmed by Sherwood Gambler [id number]) on 09/20/2020 3:30:36 PM        .Patient labs have returned. No significant abnormalities.  EKG with nsr 83 similar to previous tracings. Patient has no return of sxs.  Will d/c with medrol dosepak and benadryl. Return precautions given.    Margarita Mail, PA-C 09/20/20 1713    Sherwood Gambler, MD 09/22/20 1520

## 2020-09-20 NOTE — ED Provider Notes (Signed)
[facility] EMERGENCY DEPARTMENT Provider Note   CSN: [id number] Arrival date & time: 09/20/20  1434     History Chief Complaint  Patient presents with  . Allergic Reaction    Yvonne Powell is a 87 y.o. female history ESRD, Tuesday Thursday Saturday dialysis, CHF, DVT.  Patient reports that she was at dialysis today when she was given Calcitrol for hyperkalemia.  Patient reports that she normally receives 1 dose of Calcitrol at each visit but today they gave her 2.  Shortly after receiving the Calcitrol patient began having itching around her mouth and feeling that her throat was closing.  Patient reports that the staff gave her 50 mg of Benadryl and her symptoms have since completely resolved.  Patient reports that she did leave dialysis a little under 1 hour early reports that she did complete a full session of dialysis on Tuesday.  Currently patient reports that she is feeling well she has no complaints.  Denies recent illness, fever/chills, chest pain/shortness of breath, cough, abdominal pain, nausea/vomiting, diarrhea, rash, feeling of throat closing or any additional concerns  HPI     Past Medical History:  Diagnosis Date  . Anemia    due to kidney  . Chronic diastolic heart failure ([place]) 09/08/2019  . DVT (deep venous thrombosis) (HCC)    hx  . Gallstones 12/2018  . Hypertension   . Renal disorder   . Thyroid disease    graves dx    Patient Active Problem List   Diagnosis Date Noted  . Chronic diastolic heart failure ([place]) 09/08/2019  . Macrocytic anemia 09/08/2019  . Elevated troponin 09/08/2019  . Hypokalemia 09/08/2019  . Hypertension   . Hypothyroidism   . ESRD (end stage renal disease) on dialysis ([place]) 12/21/2018  . Cholelithiasis 12/21/2018  . Intractable generalized abdominal pain 12/20/2018    Past Surgical History:  Procedure Laterality Date  . AV FISTULA PLACEMENT Right 04/07/2017   Procedure: ARTERIOVENOUS (AV) FISTULA  CREATION;  Surgeon: Elam Dutch, MD;  Location: [facility];  Service: Vascular;  Laterality: Right;  . CHOLECYSTECTOMY N/A 12/23/2018   Procedure: LAPAROSCOPIC CHOLECYSTECTOMY;  Surgeon: Erroll Luna, MD;  Location: [facility];  Service: General;  Laterality: N/A;  . COLONOSCOPY    . DILATION AND CURETTAGE OF UTERUS    . EYE SURGERY     cactaracts  . LIPOMA EXCISION    . LUMBAR EPIDURAL INJECTION  2018     OB History   No obstetric history on file.     Family History  Problem Relation Age of Onset  . Congestive Heart Failure Maternal Grandmother 76  . Hypertension Maternal Grandmother     Social History   Tobacco Use  . Smoking status: Never Smoker  . Smokeless tobacco: Never Used  Vaping Use  . Vaping Use: Never used  Substance Use Topics  . Alcohol use: No  . Drug use: No    Home Medications Prior to Admission medications   Medication Sig Start Date End Date Taking? Authorizing Provider  calcitRIOL (ROCALTROL) 0.25 MCG capsule Take 0.25 mcg by mouth 2 times a week. Monday, Friday 12/16/17   [provider]  calcium acetate (PHOSLO) 667 MG capsule Take by mouth. 03/01/20   [provider]  Iron-FA-DSS-B Cmplx-Vit C (NEPHRON FA) TABS  02/23/20   [provider]  levothyroxine (SYNTHROID, LEVOTHROID) 88 MCG tablet Take 88 mcg by mouth daily before breakfast.    [provider]    Allergies  Doxycycline, Fish allergy, Shellfish allergy, Clindamycin/lincomycin, Compazine [prochlorperazine edisylate], Iodine, Sulfa antibiotics, and Penicillins  Review of Systems   Review of Systems Ten systems are reviewed and are negative for acute change except as noted in the HPI  Physical Exam Updated Vital Signs There were no vitals taken for this visit.  Physical Exam Constitutional:      General: She is not in acute distress.    Appearance: Normal appearance. She is well-developed. She is not ill-appearing or diaphoretic.  HENT:     Head:  Normocephalic and atraumatic.     Mouth/Throat:     Mouth: Mucous membranes are moist.     Pharynx: Oropharynx is clear.  Eyes:     General: Vision grossly intact. Gaze aligned appropriately.     Pupils: Pupils are equal, round, and reactive to light.  Neck:     Trachea: Trachea and phonation normal.  Pulmonary:     Effort: Pulmonary effort is normal. No respiratory distress.  Abdominal:     General: There is no distension.     Palpations: Abdomen is soft.     Tenderness: There is no abdominal tenderness. There is no guarding or rebound.  Musculoskeletal:        General: Normal range of motion.     Cervical back: Normal range of motion.  Skin:    General: Skin is warm and dry.  Neurological:     Mental Status: She is alert.     GCS: GCS eye subscore is 4. GCS verbal subscore is 5. GCS motor subscore is 6.     Comments: Speech is clear and goal oriented, follows commands Major Cranial nerves without deficit, no facial droop Moves extremities without ataxia, coordination intact  Psychiatric:        Behavior: Behavior normal.     ED Results / Procedures / Treatments   Labs (all labs ordered are listed, but only abnormal results are displayed) Labs Reviewed - No data to display  EKG EKG Interpretation  Date/Time:  Thursday Sep 20 2020 15:04:16 EDT Ventricular Rate:  83 PR Interval:  220 QRS Duration: 144 QT Interval:  463 QTC Calculation: 545 R Axis:   69 Text Interpretation: Sinus rhythm Prolonged PR interval IVCD, consider atypical LBBB similar to May 2021 Confirmed by Sherwood Gambler [phone]) on 09/20/2020 3:30:36 PM   Radiology No results found.  Procedures Procedures   Medications Ordered in ED Medications - No data to display  ED Course  I have reviewed the triage vital signs and the nursing notes.  Pertinent labs & imaging results that were available during my care of the patient were reviewed by me and considered in my medical decision making (see chart  for details).    MDM Rules/Calculators/A&P                         Additional history obtained from: 1. Nursing notes from this visit. ------------- 87 year old female arrived from dialysis center for concern of allergic reaction which has since completely resolved after receiving 50 mg of Benadryl.  Reaction occurred shortly after receiving 2 doses of Calcitrol.  Patient currently asymptomatic well-appearing no acute distress vital signs stable airway clear.  Plan of care is to monitor patient for recurrence of symptoms.  Care handoff given to Margarita Mail, PA-C at shift change, plan of care is to monitor patient, reassess.  Final disposition per oncoming team.  Case was discussed with attending physician Dr. Tomi Bamberger who agrees with  plan.   Note: Portions of this report may have been transcribed using voice recognition software. Every effort was made to ensure accuracy; however, inadvertent computerized transcription errors may still be present. Final Clinical Impression(s) / ED Diagnoses Final diagnoses:  None    Rx / DC Orders ED Discharge Orders    None       Gari Crown 09/20/20 1545    Dorie Rank, MD 09/21/20 [phone]

## 2020-09-20 NOTE — Discharge Instructions (Signed)
I have ordered a Medrol dose pack.Please go pick this medication up to have on hand. If your symptoms return please take 25 mg  of benadryl and begin taking the Medrol dose pack as directed. If your symptoms are severe or do not improve, call 911 and return immediately to the ER.  Get help right away if: You develop symptoms of an allergic reaction. You may notice them soon after you are exposed to a substance. Symptoms may include: Flushed skin. Hives. Swelling of the eyes, lips, face, mouth, tongue, or throat. Difficulty breathing, speaking, or swallowing. Wheezing. Dizziness or light-headedness. Fainting. Pain or cramping in the abdomen. Vomiting. Diarrhea.

## 2020-09-20 NOTE — ED Triage Notes (Signed)
Was at HD given 2 calcitriol when she started having itching in her mouth given 50 mg benadryl PO. Did complete most of HD with 54 mins left.  Currently states the itching is much better. Denies any SOB, difficulty swallowing able to speak in full sentences

## 2020-09-22 DIAGNOSIS — D509 Iron deficiency anemia, unspecified: Secondary | ICD-10-CM | POA: Diagnosis not present

## 2020-09-22 DIAGNOSIS — T7840XA Allergy, unspecified, initial encounter: Secondary | ICD-10-CM | POA: Diagnosis not present

## 2020-09-22 DIAGNOSIS — N2581 Secondary hyperparathyroidism of renal origin: Secondary | ICD-10-CM | POA: Diagnosis not present

## 2020-09-22 DIAGNOSIS — Z992 Dependence on renal dialysis: Secondary | ICD-10-CM | POA: Diagnosis not present

## 2020-09-22 DIAGNOSIS — N186 End stage renal disease: Secondary | ICD-10-CM | POA: Diagnosis not present

## 2020-09-25 DIAGNOSIS — Z992 Dependence on renal dialysis: Secondary | ICD-10-CM | POA: Diagnosis not present

## 2020-09-25 DIAGNOSIS — D509 Iron deficiency anemia, unspecified: Secondary | ICD-10-CM | POA: Diagnosis not present

## 2020-09-25 DIAGNOSIS — N2581 Secondary hyperparathyroidism of renal origin: Secondary | ICD-10-CM | POA: Diagnosis not present

## 2020-09-25 DIAGNOSIS — N186 End stage renal disease: Secondary | ICD-10-CM | POA: Diagnosis not present

## 2020-09-25 DIAGNOSIS — T7840XA Allergy, unspecified, initial encounter: Secondary | ICD-10-CM | POA: Diagnosis not present

## 2020-09-27 DIAGNOSIS — N186 End stage renal disease: Secondary | ICD-10-CM | POA: Diagnosis not present

## 2020-09-27 DIAGNOSIS — T7840XA Allergy, unspecified, initial encounter: Secondary | ICD-10-CM | POA: Diagnosis not present

## 2020-09-27 DIAGNOSIS — D509 Iron deficiency anemia, unspecified: Secondary | ICD-10-CM | POA: Diagnosis not present

## 2020-09-27 DIAGNOSIS — N2581 Secondary hyperparathyroidism of renal origin: Secondary | ICD-10-CM | POA: Diagnosis not present

## 2020-09-27 DIAGNOSIS — Z992 Dependence on renal dialysis: Secondary | ICD-10-CM | POA: Diagnosis not present

## 2020-09-29 DIAGNOSIS — D509 Iron deficiency anemia, unspecified: Secondary | ICD-10-CM | POA: Diagnosis not present

## 2020-09-29 DIAGNOSIS — N186 End stage renal disease: Secondary | ICD-10-CM | POA: Diagnosis not present

## 2020-09-29 DIAGNOSIS — T7840XA Allergy, unspecified, initial encounter: Secondary | ICD-10-CM | POA: Diagnosis not present

## 2020-09-29 DIAGNOSIS — Z992 Dependence on renal dialysis: Secondary | ICD-10-CM | POA: Diagnosis not present

## 2020-09-29 DIAGNOSIS — N2581 Secondary hyperparathyroidism of renal origin: Secondary | ICD-10-CM | POA: Diagnosis not present

## 2020-10-02 DIAGNOSIS — D509 Iron deficiency anemia, unspecified: Secondary | ICD-10-CM | POA: Diagnosis not present

## 2020-10-02 DIAGNOSIS — Z992 Dependence on renal dialysis: Secondary | ICD-10-CM | POA: Diagnosis not present

## 2020-10-02 DIAGNOSIS — T7840XA Allergy, unspecified, initial encounter: Secondary | ICD-10-CM | POA: Diagnosis not present

## 2020-10-02 DIAGNOSIS — N2581 Secondary hyperparathyroidism of renal origin: Secondary | ICD-10-CM | POA: Diagnosis not present

## 2020-10-02 DIAGNOSIS — N186 End stage renal disease: Secondary | ICD-10-CM | POA: Diagnosis not present

## 2020-10-03 DIAGNOSIS — I129 Hypertensive chronic kidney disease with stage 1 through stage 4 chronic kidney disease, or unspecified chronic kidney disease: Secondary | ICD-10-CM | POA: Diagnosis not present

## 2020-10-03 DIAGNOSIS — Z992 Dependence on renal dialysis: Secondary | ICD-10-CM | POA: Diagnosis not present

## 2020-10-03 DIAGNOSIS — N186 End stage renal disease: Secondary | ICD-10-CM | POA: Diagnosis not present

## 2020-10-04 DIAGNOSIS — Z992 Dependence on renal dialysis: Secondary | ICD-10-CM | POA: Diagnosis not present

## 2020-10-04 DIAGNOSIS — D509 Iron deficiency anemia, unspecified: Secondary | ICD-10-CM | POA: Diagnosis not present

## 2020-10-04 DIAGNOSIS — N2581 Secondary hyperparathyroidism of renal origin: Secondary | ICD-10-CM | POA: Diagnosis not present

## 2020-10-04 DIAGNOSIS — N186 End stage renal disease: Secondary | ICD-10-CM | POA: Diagnosis not present

## 2020-10-06 DIAGNOSIS — Z992 Dependence on renal dialysis: Secondary | ICD-10-CM | POA: Diagnosis not present

## 2020-10-06 DIAGNOSIS — N186 End stage renal disease: Secondary | ICD-10-CM | POA: Diagnosis not present

## 2020-10-06 DIAGNOSIS — D509 Iron deficiency anemia, unspecified: Secondary | ICD-10-CM | POA: Diagnosis not present

## 2020-10-06 DIAGNOSIS — N2581 Secondary hyperparathyroidism of renal origin: Secondary | ICD-10-CM | POA: Diagnosis not present

## 2020-10-09 DIAGNOSIS — N2581 Secondary hyperparathyroidism of renal origin: Secondary | ICD-10-CM | POA: Diagnosis not present

## 2020-10-09 DIAGNOSIS — Z992 Dependence on renal dialysis: Secondary | ICD-10-CM | POA: Diagnosis not present

## 2020-10-09 DIAGNOSIS — N186 End stage renal disease: Secondary | ICD-10-CM | POA: Diagnosis not present

## 2020-10-09 DIAGNOSIS — D509 Iron deficiency anemia, unspecified: Secondary | ICD-10-CM | POA: Diagnosis not present

## 2020-10-11 DIAGNOSIS — Z992 Dependence on renal dialysis: Secondary | ICD-10-CM | POA: Diagnosis not present

## 2020-10-11 DIAGNOSIS — D509 Iron deficiency anemia, unspecified: Secondary | ICD-10-CM | POA: Diagnosis not present

## 2020-10-11 DIAGNOSIS — N186 End stage renal disease: Secondary | ICD-10-CM | POA: Diagnosis not present

## 2020-10-11 DIAGNOSIS — N2581 Secondary hyperparathyroidism of renal origin: Secondary | ICD-10-CM | POA: Diagnosis not present

## 2020-10-12 DIAGNOSIS — I10 Essential (primary) hypertension: Secondary | ICD-10-CM | POA: Diagnosis not present

## 2020-10-12 DIAGNOSIS — E785 Hyperlipidemia, unspecified: Secondary | ICD-10-CM | POA: Diagnosis not present

## 2020-10-12 DIAGNOSIS — H9190 Unspecified hearing loss, unspecified ear: Secondary | ICD-10-CM | POA: Diagnosis not present

## 2020-10-12 DIAGNOSIS — N185 Chronic kidney disease, stage 5: Secondary | ICD-10-CM | POA: Diagnosis not present

## 2020-10-12 DIAGNOSIS — D638 Anemia in other chronic diseases classified elsewhere: Secondary | ICD-10-CM | POA: Diagnosis not present

## 2020-10-12 DIAGNOSIS — Z1331 Encounter for screening for depression: Secondary | ICD-10-CM | POA: Diagnosis not present

## 2020-10-12 DIAGNOSIS — Z992 Dependence on renal dialysis: Secondary | ICD-10-CM | POA: Diagnosis not present

## 2020-10-12 DIAGNOSIS — E039 Hypothyroidism, unspecified: Secondary | ICD-10-CM | POA: Diagnosis not present

## 2020-10-12 DIAGNOSIS — N2581 Secondary hyperparathyroidism of renal origin: Secondary | ICD-10-CM | POA: Diagnosis not present

## 2020-10-12 DIAGNOSIS — Z0000 Encounter for general adult medical examination without abnormal findings: Secondary | ICD-10-CM | POA: Diagnosis not present

## 2020-10-12 DIAGNOSIS — Z1389 Encounter for screening for other disorder: Secondary | ICD-10-CM | POA: Diagnosis not present

## 2020-10-12 DIAGNOSIS — I120 Hypertensive chronic kidney disease with stage 5 chronic kidney disease or end stage renal disease: Secondary | ICD-10-CM | POA: Diagnosis not present

## 2020-10-13 DIAGNOSIS — N2581 Secondary hyperparathyroidism of renal origin: Secondary | ICD-10-CM | POA: Diagnosis not present

## 2020-10-13 DIAGNOSIS — D509 Iron deficiency anemia, unspecified: Secondary | ICD-10-CM | POA: Diagnosis not present

## 2020-10-13 DIAGNOSIS — Z992 Dependence on renal dialysis: Secondary | ICD-10-CM | POA: Diagnosis not present

## 2020-10-13 DIAGNOSIS — N186 End stage renal disease: Secondary | ICD-10-CM | POA: Diagnosis not present

## 2020-10-16 DIAGNOSIS — N2581 Secondary hyperparathyroidism of renal origin: Secondary | ICD-10-CM | POA: Diagnosis not present

## 2020-10-16 DIAGNOSIS — N186 End stage renal disease: Secondary | ICD-10-CM | POA: Diagnosis not present

## 2020-10-16 DIAGNOSIS — D509 Iron deficiency anemia, unspecified: Secondary | ICD-10-CM | POA: Diagnosis not present

## 2020-10-16 DIAGNOSIS — Z992 Dependence on renal dialysis: Secondary | ICD-10-CM | POA: Diagnosis not present

## 2020-10-18 DIAGNOSIS — N186 End stage renal disease: Secondary | ICD-10-CM | POA: Diagnosis not present

## 2020-10-18 DIAGNOSIS — D509 Iron deficiency anemia, unspecified: Secondary | ICD-10-CM | POA: Diagnosis not present

## 2020-10-18 DIAGNOSIS — N2581 Secondary hyperparathyroidism of renal origin: Secondary | ICD-10-CM | POA: Diagnosis not present

## 2020-10-18 DIAGNOSIS — Z992 Dependence on renal dialysis: Secondary | ICD-10-CM | POA: Diagnosis not present

## 2020-10-20 DIAGNOSIS — Z992 Dependence on renal dialysis: Secondary | ICD-10-CM | POA: Diagnosis not present

## 2020-10-20 DIAGNOSIS — N2581 Secondary hyperparathyroidism of renal origin: Secondary | ICD-10-CM | POA: Diagnosis not present

## 2020-10-20 DIAGNOSIS — N186 End stage renal disease: Secondary | ICD-10-CM | POA: Diagnosis not present

## 2020-10-20 DIAGNOSIS — D509 Iron deficiency anemia, unspecified: Secondary | ICD-10-CM | POA: Diagnosis not present

## 2020-10-23 DIAGNOSIS — N186 End stage renal disease: Secondary | ICD-10-CM | POA: Diagnosis not present

## 2020-10-23 DIAGNOSIS — N2581 Secondary hyperparathyroidism of renal origin: Secondary | ICD-10-CM | POA: Diagnosis not present

## 2020-10-23 DIAGNOSIS — D509 Iron deficiency anemia, unspecified: Secondary | ICD-10-CM | POA: Diagnosis not present

## 2020-10-23 DIAGNOSIS — Z992 Dependence on renal dialysis: Secondary | ICD-10-CM | POA: Diagnosis not present

## 2020-10-25 DIAGNOSIS — Z992 Dependence on renal dialysis: Secondary | ICD-10-CM | POA: Diagnosis not present

## 2020-10-25 DIAGNOSIS — N2581 Secondary hyperparathyroidism of renal origin: Secondary | ICD-10-CM | POA: Diagnosis not present

## 2020-10-25 DIAGNOSIS — N186 End stage renal disease: Secondary | ICD-10-CM | POA: Diagnosis not present

## 2020-10-25 DIAGNOSIS — D509 Iron deficiency anemia, unspecified: Secondary | ICD-10-CM | POA: Diagnosis not present

## 2020-10-27 DIAGNOSIS — D509 Iron deficiency anemia, unspecified: Secondary | ICD-10-CM | POA: Diagnosis not present

## 2020-10-27 DIAGNOSIS — N2581 Secondary hyperparathyroidism of renal origin: Secondary | ICD-10-CM | POA: Diagnosis not present

## 2020-10-27 DIAGNOSIS — N186 End stage renal disease: Secondary | ICD-10-CM | POA: Diagnosis not present

## 2020-10-27 DIAGNOSIS — Z992 Dependence on renal dialysis: Secondary | ICD-10-CM | POA: Diagnosis not present

## 2020-10-30 DIAGNOSIS — N186 End stage renal disease: Secondary | ICD-10-CM | POA: Diagnosis not present

## 2020-10-30 DIAGNOSIS — N2581 Secondary hyperparathyroidism of renal origin: Secondary | ICD-10-CM | POA: Diagnosis not present

## 2020-10-30 DIAGNOSIS — Z992 Dependence on renal dialysis: Secondary | ICD-10-CM | POA: Diagnosis not present

## 2020-10-30 DIAGNOSIS — D509 Iron deficiency anemia, unspecified: Secondary | ICD-10-CM | POA: Diagnosis not present

## 2020-11-01 DIAGNOSIS — N186 End stage renal disease: Secondary | ICD-10-CM | POA: Diagnosis not present

## 2020-11-01 DIAGNOSIS — D509 Iron deficiency anemia, unspecified: Secondary | ICD-10-CM | POA: Diagnosis not present

## 2020-11-01 DIAGNOSIS — N2581 Secondary hyperparathyroidism of renal origin: Secondary | ICD-10-CM | POA: Diagnosis not present

## 2020-11-01 DIAGNOSIS — Z992 Dependence on renal dialysis: Secondary | ICD-10-CM | POA: Diagnosis not present

## 2020-11-02 DIAGNOSIS — Z992 Dependence on renal dialysis: Secondary | ICD-10-CM | POA: Diagnosis not present

## 2020-11-02 DIAGNOSIS — N186 End stage renal disease: Secondary | ICD-10-CM | POA: Diagnosis not present

## 2020-11-02 DIAGNOSIS — I129 Hypertensive chronic kidney disease with stage 1 through stage 4 chronic kidney disease, or unspecified chronic kidney disease: Secondary | ICD-10-CM | POA: Diagnosis not present

## 2020-11-03 DIAGNOSIS — Z992 Dependence on renal dialysis: Secondary | ICD-10-CM | POA: Diagnosis not present

## 2020-11-03 DIAGNOSIS — D509 Iron deficiency anemia, unspecified: Secondary | ICD-10-CM | POA: Diagnosis not present

## 2020-11-03 DIAGNOSIS — N186 End stage renal disease: Secondary | ICD-10-CM | POA: Diagnosis not present

## 2020-11-03 DIAGNOSIS — N2581 Secondary hyperparathyroidism of renal origin: Secondary | ICD-10-CM | POA: Diagnosis not present

## 2020-11-06 DIAGNOSIS — N186 End stage renal disease: Secondary | ICD-10-CM | POA: Diagnosis not present

## 2020-11-06 DIAGNOSIS — N2581 Secondary hyperparathyroidism of renal origin: Secondary | ICD-10-CM | POA: Diagnosis not present

## 2020-11-06 DIAGNOSIS — D509 Iron deficiency anemia, unspecified: Secondary | ICD-10-CM | POA: Diagnosis not present

## 2020-11-06 DIAGNOSIS — Z992 Dependence on renal dialysis: Secondary | ICD-10-CM | POA: Diagnosis not present

## 2020-11-08 DIAGNOSIS — N2581 Secondary hyperparathyroidism of renal origin: Secondary | ICD-10-CM | POA: Diagnosis not present

## 2020-11-08 DIAGNOSIS — Z992 Dependence on renal dialysis: Secondary | ICD-10-CM | POA: Diagnosis not present

## 2020-11-08 DIAGNOSIS — D509 Iron deficiency anemia, unspecified: Secondary | ICD-10-CM | POA: Diagnosis not present

## 2020-11-08 DIAGNOSIS — N186 End stage renal disease: Secondary | ICD-10-CM | POA: Diagnosis not present

## 2020-11-10 DIAGNOSIS — N2581 Secondary hyperparathyroidism of renal origin: Secondary | ICD-10-CM | POA: Diagnosis not present

## 2020-11-10 DIAGNOSIS — Z992 Dependence on renal dialysis: Secondary | ICD-10-CM | POA: Diagnosis not present

## 2020-11-10 DIAGNOSIS — D509 Iron deficiency anemia, unspecified: Secondary | ICD-10-CM | POA: Diagnosis not present

## 2020-11-10 DIAGNOSIS — N186 End stage renal disease: Secondary | ICD-10-CM | POA: Diagnosis not present

## 2020-11-13 DIAGNOSIS — Z992 Dependence on renal dialysis: Secondary | ICD-10-CM | POA: Diagnosis not present

## 2020-11-13 DIAGNOSIS — N2581 Secondary hyperparathyroidism of renal origin: Secondary | ICD-10-CM | POA: Diagnosis not present

## 2020-11-13 DIAGNOSIS — N186 End stage renal disease: Secondary | ICD-10-CM | POA: Diagnosis not present

## 2020-11-13 DIAGNOSIS — D509 Iron deficiency anemia, unspecified: Secondary | ICD-10-CM | POA: Diagnosis not present

## 2020-11-15 DIAGNOSIS — Z992 Dependence on renal dialysis: Secondary | ICD-10-CM | POA: Diagnosis not present

## 2020-11-15 DIAGNOSIS — D509 Iron deficiency anemia, unspecified: Secondary | ICD-10-CM | POA: Diagnosis not present

## 2020-11-15 DIAGNOSIS — N2581 Secondary hyperparathyroidism of renal origin: Secondary | ICD-10-CM | POA: Diagnosis not present

## 2020-11-15 DIAGNOSIS — N186 End stage renal disease: Secondary | ICD-10-CM | POA: Diagnosis not present

## 2020-11-17 DIAGNOSIS — N2581 Secondary hyperparathyroidism of renal origin: Secondary | ICD-10-CM | POA: Diagnosis not present

## 2020-11-17 DIAGNOSIS — N186 End stage renal disease: Secondary | ICD-10-CM | POA: Diagnosis not present

## 2020-11-17 DIAGNOSIS — D509 Iron deficiency anemia, unspecified: Secondary | ICD-10-CM | POA: Diagnosis not present

## 2020-11-17 DIAGNOSIS — Z992 Dependence on renal dialysis: Secondary | ICD-10-CM | POA: Diagnosis not present

## 2020-11-20 DIAGNOSIS — N186 End stage renal disease: Secondary | ICD-10-CM | POA: Diagnosis not present

## 2020-11-20 DIAGNOSIS — Z992 Dependence on renal dialysis: Secondary | ICD-10-CM | POA: Diagnosis not present

## 2020-11-20 DIAGNOSIS — N2581 Secondary hyperparathyroidism of renal origin: Secondary | ICD-10-CM | POA: Diagnosis not present

## 2020-11-20 DIAGNOSIS — D509 Iron deficiency anemia, unspecified: Secondary | ICD-10-CM | POA: Diagnosis not present

## 2020-11-22 DIAGNOSIS — N2581 Secondary hyperparathyroidism of renal origin: Secondary | ICD-10-CM | POA: Diagnosis not present

## 2020-11-22 DIAGNOSIS — N186 End stage renal disease: Secondary | ICD-10-CM | POA: Diagnosis not present

## 2020-11-22 DIAGNOSIS — D509 Iron deficiency anemia, unspecified: Secondary | ICD-10-CM | POA: Diagnosis not present

## 2020-11-22 DIAGNOSIS — Z992 Dependence on renal dialysis: Secondary | ICD-10-CM | POA: Diagnosis not present

## 2020-11-24 DIAGNOSIS — D509 Iron deficiency anemia, unspecified: Secondary | ICD-10-CM | POA: Diagnosis not present

## 2020-11-24 DIAGNOSIS — Z992 Dependence on renal dialysis: Secondary | ICD-10-CM | POA: Diagnosis not present

## 2020-11-24 DIAGNOSIS — N2581 Secondary hyperparathyroidism of renal origin: Secondary | ICD-10-CM | POA: Diagnosis not present

## 2020-11-24 DIAGNOSIS — N186 End stage renal disease: Secondary | ICD-10-CM | POA: Diagnosis not present

## 2020-11-27 DIAGNOSIS — Z992 Dependence on renal dialysis: Secondary | ICD-10-CM | POA: Diagnosis not present

## 2020-11-27 DIAGNOSIS — D509 Iron deficiency anemia, unspecified: Secondary | ICD-10-CM | POA: Diagnosis not present

## 2020-11-27 DIAGNOSIS — N186 End stage renal disease: Secondary | ICD-10-CM | POA: Diagnosis not present

## 2020-11-27 DIAGNOSIS — N2581 Secondary hyperparathyroidism of renal origin: Secondary | ICD-10-CM | POA: Diagnosis not present

## 2020-11-29 DIAGNOSIS — N2581 Secondary hyperparathyroidism of renal origin: Secondary | ICD-10-CM | POA: Diagnosis not present

## 2020-11-29 DIAGNOSIS — D509 Iron deficiency anemia, unspecified: Secondary | ICD-10-CM | POA: Diagnosis not present

## 2020-11-29 DIAGNOSIS — Z992 Dependence on renal dialysis: Secondary | ICD-10-CM | POA: Diagnosis not present

## 2020-11-29 DIAGNOSIS — N186 End stage renal disease: Secondary | ICD-10-CM | POA: Diagnosis not present

## 2020-12-01 DIAGNOSIS — N186 End stage renal disease: Secondary | ICD-10-CM | POA: Diagnosis not present

## 2020-12-01 DIAGNOSIS — D509 Iron deficiency anemia, unspecified: Secondary | ICD-10-CM | POA: Diagnosis not present

## 2020-12-01 DIAGNOSIS — Z992 Dependence on renal dialysis: Secondary | ICD-10-CM | POA: Diagnosis not present

## 2020-12-01 DIAGNOSIS — N2581 Secondary hyperparathyroidism of renal origin: Secondary | ICD-10-CM | POA: Diagnosis not present

## 2020-12-03 DIAGNOSIS — N186 End stage renal disease: Secondary | ICD-10-CM | POA: Diagnosis not present

## 2020-12-03 DIAGNOSIS — Z992 Dependence on renal dialysis: Secondary | ICD-10-CM | POA: Diagnosis not present

## 2020-12-03 DIAGNOSIS — I129 Hypertensive chronic kidney disease with stage 1 through stage 4 chronic kidney disease, or unspecified chronic kidney disease: Secondary | ICD-10-CM | POA: Diagnosis not present

## 2020-12-04 DIAGNOSIS — D631 Anemia in chronic kidney disease: Secondary | ICD-10-CM | POA: Diagnosis not present

## 2020-12-04 DIAGNOSIS — Z992 Dependence on renal dialysis: Secondary | ICD-10-CM | POA: Diagnosis not present

## 2020-12-04 DIAGNOSIS — N2581 Secondary hyperparathyroidism of renal origin: Secondary | ICD-10-CM | POA: Diagnosis not present

## 2020-12-04 DIAGNOSIS — N186 End stage renal disease: Secondary | ICD-10-CM | POA: Diagnosis not present

## 2020-12-06 DIAGNOSIS — N2581 Secondary hyperparathyroidism of renal origin: Secondary | ICD-10-CM | POA: Diagnosis not present

## 2020-12-06 DIAGNOSIS — N186 End stage renal disease: Secondary | ICD-10-CM | POA: Diagnosis not present

## 2020-12-06 DIAGNOSIS — D631 Anemia in chronic kidney disease: Secondary | ICD-10-CM | POA: Diagnosis not present

## 2020-12-06 DIAGNOSIS — Z992 Dependence on renal dialysis: Secondary | ICD-10-CM | POA: Diagnosis not present

## 2020-12-08 DIAGNOSIS — D631 Anemia in chronic kidney disease: Secondary | ICD-10-CM | POA: Diagnosis not present

## 2020-12-08 DIAGNOSIS — Z992 Dependence on renal dialysis: Secondary | ICD-10-CM | POA: Diagnosis not present

## 2020-12-08 DIAGNOSIS — N186 End stage renal disease: Secondary | ICD-10-CM | POA: Diagnosis not present

## 2020-12-08 DIAGNOSIS — N2581 Secondary hyperparathyroidism of renal origin: Secondary | ICD-10-CM | POA: Diagnosis not present

## 2020-12-11 DIAGNOSIS — D631 Anemia in chronic kidney disease: Secondary | ICD-10-CM | POA: Diagnosis not present

## 2020-12-11 DIAGNOSIS — N186 End stage renal disease: Secondary | ICD-10-CM | POA: Diagnosis not present

## 2020-12-11 DIAGNOSIS — Z992 Dependence on renal dialysis: Secondary | ICD-10-CM | POA: Diagnosis not present

## 2020-12-11 DIAGNOSIS — N2581 Secondary hyperparathyroidism of renal origin: Secondary | ICD-10-CM | POA: Diagnosis not present

## 2020-12-13 DIAGNOSIS — Z992 Dependence on renal dialysis: Secondary | ICD-10-CM | POA: Diagnosis not present

## 2020-12-13 DIAGNOSIS — D631 Anemia in chronic kidney disease: Secondary | ICD-10-CM | POA: Diagnosis not present

## 2020-12-13 DIAGNOSIS — N186 End stage renal disease: Secondary | ICD-10-CM | POA: Diagnosis not present

## 2020-12-13 DIAGNOSIS — N2581 Secondary hyperparathyroidism of renal origin: Secondary | ICD-10-CM | POA: Diagnosis not present

## 2020-12-15 DIAGNOSIS — Z992 Dependence on renal dialysis: Secondary | ICD-10-CM | POA: Diagnosis not present

## 2020-12-15 DIAGNOSIS — N186 End stage renal disease: Secondary | ICD-10-CM | POA: Diagnosis not present

## 2020-12-15 DIAGNOSIS — D631 Anemia in chronic kidney disease: Secondary | ICD-10-CM | POA: Diagnosis not present

## 2020-12-15 DIAGNOSIS — N2581 Secondary hyperparathyroidism of renal origin: Secondary | ICD-10-CM | POA: Diagnosis not present

## 2020-12-18 DIAGNOSIS — N186 End stage renal disease: Secondary | ICD-10-CM | POA: Diagnosis not present

## 2020-12-18 DIAGNOSIS — N2581 Secondary hyperparathyroidism of renal origin: Secondary | ICD-10-CM | POA: Diagnosis not present

## 2020-12-18 DIAGNOSIS — Z992 Dependence on renal dialysis: Secondary | ICD-10-CM | POA: Diagnosis not present

## 2020-12-18 DIAGNOSIS — D631 Anemia in chronic kidney disease: Secondary | ICD-10-CM | POA: Diagnosis not present

## 2020-12-20 DIAGNOSIS — N2581 Secondary hyperparathyroidism of renal origin: Secondary | ICD-10-CM | POA: Diagnosis not present

## 2020-12-20 DIAGNOSIS — Z992 Dependence on renal dialysis: Secondary | ICD-10-CM | POA: Diagnosis not present

## 2020-12-20 DIAGNOSIS — D631 Anemia in chronic kidney disease: Secondary | ICD-10-CM | POA: Diagnosis not present

## 2020-12-20 DIAGNOSIS — N186 End stage renal disease: Secondary | ICD-10-CM | POA: Diagnosis not present

## 2020-12-21 DIAGNOSIS — H349 Unspecified retinal vascular occlusion: Secondary | ICD-10-CM | POA: Diagnosis not present

## 2020-12-21 DIAGNOSIS — H348312 Tributary (branch) retinal vein occlusion, right eye, stable: Secondary | ICD-10-CM | POA: Diagnosis not present

## 2020-12-21 DIAGNOSIS — H524 Presbyopia: Secondary | ICD-10-CM | POA: Diagnosis not present

## 2020-12-21 DIAGNOSIS — Z961 Presence of intraocular lens: Secondary | ICD-10-CM | POA: Diagnosis not present

## 2020-12-21 DIAGNOSIS — H52203 Unspecified astigmatism, bilateral: Secondary | ICD-10-CM | POA: Diagnosis not present

## 2020-12-22 DIAGNOSIS — N186 End stage renal disease: Secondary | ICD-10-CM | POA: Diagnosis not present

## 2020-12-22 DIAGNOSIS — Z992 Dependence on renal dialysis: Secondary | ICD-10-CM | POA: Diagnosis not present

## 2020-12-22 DIAGNOSIS — N2581 Secondary hyperparathyroidism of renal origin: Secondary | ICD-10-CM | POA: Diagnosis not present

## 2020-12-22 DIAGNOSIS — D631 Anemia in chronic kidney disease: Secondary | ICD-10-CM | POA: Diagnosis not present

## 2020-12-25 DIAGNOSIS — D631 Anemia in chronic kidney disease: Secondary | ICD-10-CM | POA: Diagnosis not present

## 2020-12-25 DIAGNOSIS — Z992 Dependence on renal dialysis: Secondary | ICD-10-CM | POA: Diagnosis not present

## 2020-12-25 DIAGNOSIS — N2581 Secondary hyperparathyroidism of renal origin: Secondary | ICD-10-CM | POA: Diagnosis not present

## 2020-12-25 DIAGNOSIS — N186 End stage renal disease: Secondary | ICD-10-CM | POA: Diagnosis not present

## 2020-12-27 DIAGNOSIS — N186 End stage renal disease: Secondary | ICD-10-CM | POA: Diagnosis not present

## 2020-12-27 DIAGNOSIS — N2581 Secondary hyperparathyroidism of renal origin: Secondary | ICD-10-CM | POA: Diagnosis not present

## 2020-12-27 DIAGNOSIS — Z992 Dependence on renal dialysis: Secondary | ICD-10-CM | POA: Diagnosis not present

## 2020-12-27 DIAGNOSIS — D631 Anemia in chronic kidney disease: Secondary | ICD-10-CM | POA: Diagnosis not present

## 2020-12-29 DIAGNOSIS — Z992 Dependence on renal dialysis: Secondary | ICD-10-CM | POA: Diagnosis not present

## 2020-12-29 DIAGNOSIS — N186 End stage renal disease: Secondary | ICD-10-CM | POA: Diagnosis not present

## 2020-12-29 DIAGNOSIS — D631 Anemia in chronic kidney disease: Secondary | ICD-10-CM | POA: Diagnosis not present

## 2020-12-29 DIAGNOSIS — N2581 Secondary hyperparathyroidism of renal origin: Secondary | ICD-10-CM | POA: Diagnosis not present

## 2021-01-01 DIAGNOSIS — N186 End stage renal disease: Secondary | ICD-10-CM | POA: Diagnosis not present

## 2021-01-01 DIAGNOSIS — Z992 Dependence on renal dialysis: Secondary | ICD-10-CM | POA: Diagnosis not present

## 2021-01-01 DIAGNOSIS — N2581 Secondary hyperparathyroidism of renal origin: Secondary | ICD-10-CM | POA: Diagnosis not present

## 2021-01-01 DIAGNOSIS — D631 Anemia in chronic kidney disease: Secondary | ICD-10-CM | POA: Diagnosis not present

## 2021-01-03 DIAGNOSIS — I129 Hypertensive chronic kidney disease with stage 1 through stage 4 chronic kidney disease, or unspecified chronic kidney disease: Secondary | ICD-10-CM | POA: Diagnosis not present

## 2021-01-03 DIAGNOSIS — D631 Anemia in chronic kidney disease: Secondary | ICD-10-CM | POA: Diagnosis not present

## 2021-01-03 DIAGNOSIS — N186 End stage renal disease: Secondary | ICD-10-CM | POA: Diagnosis not present

## 2021-01-03 DIAGNOSIS — Z992 Dependence on renal dialysis: Secondary | ICD-10-CM | POA: Diagnosis not present

## 2021-01-03 DIAGNOSIS — N2581 Secondary hyperparathyroidism of renal origin: Secondary | ICD-10-CM | POA: Diagnosis not present

## 2021-01-05 DIAGNOSIS — N2581 Secondary hyperparathyroidism of renal origin: Secondary | ICD-10-CM | POA: Diagnosis not present

## 2021-01-05 DIAGNOSIS — N186 End stage renal disease: Secondary | ICD-10-CM | POA: Diagnosis not present

## 2021-01-05 DIAGNOSIS — D631 Anemia in chronic kidney disease: Secondary | ICD-10-CM | POA: Diagnosis not present

## 2021-01-05 DIAGNOSIS — Z992 Dependence on renal dialysis: Secondary | ICD-10-CM | POA: Diagnosis not present

## 2021-01-08 DIAGNOSIS — N2581 Secondary hyperparathyroidism of renal origin: Secondary | ICD-10-CM | POA: Diagnosis not present

## 2021-01-08 DIAGNOSIS — Z992 Dependence on renal dialysis: Secondary | ICD-10-CM | POA: Diagnosis not present

## 2021-01-08 DIAGNOSIS — N186 End stage renal disease: Secondary | ICD-10-CM | POA: Diagnosis not present

## 2021-01-08 DIAGNOSIS — D631 Anemia in chronic kidney disease: Secondary | ICD-10-CM | POA: Diagnosis not present

## 2021-01-10 DIAGNOSIS — N186 End stage renal disease: Secondary | ICD-10-CM | POA: Diagnosis not present

## 2021-01-10 DIAGNOSIS — Z992 Dependence on renal dialysis: Secondary | ICD-10-CM | POA: Diagnosis not present

## 2021-01-10 DIAGNOSIS — D631 Anemia in chronic kidney disease: Secondary | ICD-10-CM | POA: Diagnosis not present

## 2021-01-10 DIAGNOSIS — N2581 Secondary hyperparathyroidism of renal origin: Secondary | ICD-10-CM | POA: Diagnosis not present

## 2021-01-12 DIAGNOSIS — N186 End stage renal disease: Secondary | ICD-10-CM | POA: Diagnosis not present

## 2021-01-12 DIAGNOSIS — Z992 Dependence on renal dialysis: Secondary | ICD-10-CM | POA: Diagnosis not present

## 2021-01-12 DIAGNOSIS — D631 Anemia in chronic kidney disease: Secondary | ICD-10-CM | POA: Diagnosis not present

## 2021-01-12 DIAGNOSIS — N2581 Secondary hyperparathyroidism of renal origin: Secondary | ICD-10-CM | POA: Diagnosis not present

## 2021-01-15 DIAGNOSIS — D631 Anemia in chronic kidney disease: Secondary | ICD-10-CM | POA: Diagnosis not present

## 2021-01-15 DIAGNOSIS — Z992 Dependence on renal dialysis: Secondary | ICD-10-CM | POA: Diagnosis not present

## 2021-01-15 DIAGNOSIS — N2581 Secondary hyperparathyroidism of renal origin: Secondary | ICD-10-CM | POA: Diagnosis not present

## 2021-01-15 DIAGNOSIS — N186 End stage renal disease: Secondary | ICD-10-CM | POA: Diagnosis not present

## 2021-01-17 DIAGNOSIS — N2581 Secondary hyperparathyroidism of renal origin: Secondary | ICD-10-CM | POA: Diagnosis not present

## 2021-01-17 DIAGNOSIS — N186 End stage renal disease: Secondary | ICD-10-CM | POA: Diagnosis not present

## 2021-01-17 DIAGNOSIS — Z992 Dependence on renal dialysis: Secondary | ICD-10-CM | POA: Diagnosis not present

## 2021-01-17 DIAGNOSIS — D631 Anemia in chronic kidney disease: Secondary | ICD-10-CM | POA: Diagnosis not present

## 2021-01-19 DIAGNOSIS — N2581 Secondary hyperparathyroidism of renal origin: Secondary | ICD-10-CM | POA: Diagnosis not present

## 2021-01-19 DIAGNOSIS — N186 End stage renal disease: Secondary | ICD-10-CM | POA: Diagnosis not present

## 2021-01-19 DIAGNOSIS — Z992 Dependence on renal dialysis: Secondary | ICD-10-CM | POA: Diagnosis not present

## 2021-01-19 DIAGNOSIS — D631 Anemia in chronic kidney disease: Secondary | ICD-10-CM | POA: Diagnosis not present

## 2021-01-22 DIAGNOSIS — N2581 Secondary hyperparathyroidism of renal origin: Secondary | ICD-10-CM | POA: Diagnosis not present

## 2021-01-22 DIAGNOSIS — N186 End stage renal disease: Secondary | ICD-10-CM | POA: Diagnosis not present

## 2021-01-22 DIAGNOSIS — Z992 Dependence on renal dialysis: Secondary | ICD-10-CM | POA: Diagnosis not present

## 2021-01-22 DIAGNOSIS — D631 Anemia in chronic kidney disease: Secondary | ICD-10-CM | POA: Diagnosis not present

## 2021-01-24 DIAGNOSIS — N186 End stage renal disease: Secondary | ICD-10-CM | POA: Diagnosis not present

## 2021-01-24 DIAGNOSIS — D631 Anemia in chronic kidney disease: Secondary | ICD-10-CM | POA: Diagnosis not present

## 2021-01-24 DIAGNOSIS — Z992 Dependence on renal dialysis: Secondary | ICD-10-CM | POA: Diagnosis not present

## 2021-01-24 DIAGNOSIS — N2581 Secondary hyperparathyroidism of renal origin: Secondary | ICD-10-CM | POA: Diagnosis not present

## 2021-01-26 DIAGNOSIS — Z992 Dependence on renal dialysis: Secondary | ICD-10-CM | POA: Diagnosis not present

## 2021-01-26 DIAGNOSIS — D631 Anemia in chronic kidney disease: Secondary | ICD-10-CM | POA: Diagnosis not present

## 2021-01-26 DIAGNOSIS — N186 End stage renal disease: Secondary | ICD-10-CM | POA: Diagnosis not present

## 2021-01-26 DIAGNOSIS — N2581 Secondary hyperparathyroidism of renal origin: Secondary | ICD-10-CM | POA: Diagnosis not present

## 2021-01-29 DIAGNOSIS — N186 End stage renal disease: Secondary | ICD-10-CM | POA: Diagnosis not present

## 2021-01-29 DIAGNOSIS — D631 Anemia in chronic kidney disease: Secondary | ICD-10-CM | POA: Diagnosis not present

## 2021-01-29 DIAGNOSIS — Z992 Dependence on renal dialysis: Secondary | ICD-10-CM | POA: Diagnosis not present

## 2021-01-29 DIAGNOSIS — N2581 Secondary hyperparathyroidism of renal origin: Secondary | ICD-10-CM | POA: Diagnosis not present

## 2021-01-31 DIAGNOSIS — D631 Anemia in chronic kidney disease: Secondary | ICD-10-CM | POA: Diagnosis not present

## 2021-01-31 DIAGNOSIS — Z992 Dependence on renal dialysis: Secondary | ICD-10-CM | POA: Diagnosis not present

## 2021-01-31 DIAGNOSIS — N2581 Secondary hyperparathyroidism of renal origin: Secondary | ICD-10-CM | POA: Diagnosis not present

## 2021-01-31 DIAGNOSIS — N186 End stage renal disease: Secondary | ICD-10-CM | POA: Diagnosis not present

## 2021-02-02 DIAGNOSIS — Z992 Dependence on renal dialysis: Secondary | ICD-10-CM | POA: Diagnosis not present

## 2021-02-02 DIAGNOSIS — N186 End stage renal disease: Secondary | ICD-10-CM | POA: Diagnosis not present

## 2021-02-02 DIAGNOSIS — I129 Hypertensive chronic kidney disease with stage 1 through stage 4 chronic kidney disease, or unspecified chronic kidney disease: Secondary | ICD-10-CM | POA: Diagnosis not present

## 2021-02-02 DIAGNOSIS — D631 Anemia in chronic kidney disease: Secondary | ICD-10-CM | POA: Diagnosis not present

## 2021-02-02 DIAGNOSIS — D509 Iron deficiency anemia, unspecified: Secondary | ICD-10-CM | POA: Diagnosis not present

## 2021-02-02 DIAGNOSIS — N2581 Secondary hyperparathyroidism of renal origin: Secondary | ICD-10-CM | POA: Diagnosis not present

## 2021-02-04 DIAGNOSIS — D4102 Neoplasm of uncertain behavior of left kidney: Secondary | ICD-10-CM | POA: Diagnosis not present

## 2021-02-04 DIAGNOSIS — N185 Chronic kidney disease, stage 5: Secondary | ICD-10-CM | POA: Diagnosis not present

## 2021-02-05 DIAGNOSIS — D631 Anemia in chronic kidney disease: Secondary | ICD-10-CM | POA: Diagnosis not present

## 2021-02-05 DIAGNOSIS — N2581 Secondary hyperparathyroidism of renal origin: Secondary | ICD-10-CM | POA: Diagnosis not present

## 2021-02-05 DIAGNOSIS — D509 Iron deficiency anemia, unspecified: Secondary | ICD-10-CM | POA: Diagnosis not present

## 2021-02-05 DIAGNOSIS — Z992 Dependence on renal dialysis: Secondary | ICD-10-CM | POA: Diagnosis not present

## 2021-02-05 DIAGNOSIS — N186 End stage renal disease: Secondary | ICD-10-CM | POA: Diagnosis not present

## 2021-02-07 DIAGNOSIS — D631 Anemia in chronic kidney disease: Secondary | ICD-10-CM | POA: Diagnosis not present

## 2021-02-07 DIAGNOSIS — N186 End stage renal disease: Secondary | ICD-10-CM | POA: Diagnosis not present

## 2021-02-07 DIAGNOSIS — Z992 Dependence on renal dialysis: Secondary | ICD-10-CM | POA: Diagnosis not present

## 2021-02-07 DIAGNOSIS — N2581 Secondary hyperparathyroidism of renal origin: Secondary | ICD-10-CM | POA: Diagnosis not present

## 2021-02-07 DIAGNOSIS — D509 Iron deficiency anemia, unspecified: Secondary | ICD-10-CM | POA: Diagnosis not present

## 2021-02-09 DIAGNOSIS — N186 End stage renal disease: Secondary | ICD-10-CM | POA: Diagnosis not present

## 2021-02-09 DIAGNOSIS — D631 Anemia in chronic kidney disease: Secondary | ICD-10-CM | POA: Diagnosis not present

## 2021-02-09 DIAGNOSIS — Z992 Dependence on renal dialysis: Secondary | ICD-10-CM | POA: Diagnosis not present

## 2021-02-09 DIAGNOSIS — D509 Iron deficiency anemia, unspecified: Secondary | ICD-10-CM | POA: Diagnosis not present

## 2021-02-09 DIAGNOSIS — N2581 Secondary hyperparathyroidism of renal origin: Secondary | ICD-10-CM | POA: Diagnosis not present

## 2021-02-12 DIAGNOSIS — N2581 Secondary hyperparathyroidism of renal origin: Secondary | ICD-10-CM | POA: Diagnosis not present

## 2021-02-12 DIAGNOSIS — D631 Anemia in chronic kidney disease: Secondary | ICD-10-CM | POA: Diagnosis not present

## 2021-02-12 DIAGNOSIS — Z992 Dependence on renal dialysis: Secondary | ICD-10-CM | POA: Diagnosis not present

## 2021-02-12 DIAGNOSIS — D509 Iron deficiency anemia, unspecified: Secondary | ICD-10-CM | POA: Diagnosis not present

## 2021-02-12 DIAGNOSIS — N186 End stage renal disease: Secondary | ICD-10-CM | POA: Diagnosis not present

## 2021-02-14 DIAGNOSIS — N186 End stage renal disease: Secondary | ICD-10-CM | POA: Diagnosis not present

## 2021-02-14 DIAGNOSIS — Z992 Dependence on renal dialysis: Secondary | ICD-10-CM | POA: Diagnosis not present

## 2021-02-14 DIAGNOSIS — D509 Iron deficiency anemia, unspecified: Secondary | ICD-10-CM | POA: Diagnosis not present

## 2021-02-14 DIAGNOSIS — D631 Anemia in chronic kidney disease: Secondary | ICD-10-CM | POA: Diagnosis not present

## 2021-02-14 DIAGNOSIS — N2581 Secondary hyperparathyroidism of renal origin: Secondary | ICD-10-CM | POA: Diagnosis not present

## 2021-02-16 DIAGNOSIS — Z992 Dependence on renal dialysis: Secondary | ICD-10-CM | POA: Diagnosis not present

## 2021-02-16 DIAGNOSIS — D509 Iron deficiency anemia, unspecified: Secondary | ICD-10-CM | POA: Diagnosis not present

## 2021-02-16 DIAGNOSIS — N2581 Secondary hyperparathyroidism of renal origin: Secondary | ICD-10-CM | POA: Diagnosis not present

## 2021-02-16 DIAGNOSIS — N186 End stage renal disease: Secondary | ICD-10-CM | POA: Diagnosis not present

## 2021-02-16 DIAGNOSIS — D631 Anemia in chronic kidney disease: Secondary | ICD-10-CM | POA: Diagnosis not present

## 2021-02-19 DIAGNOSIS — D631 Anemia in chronic kidney disease: Secondary | ICD-10-CM | POA: Diagnosis not present

## 2021-02-19 DIAGNOSIS — N2581 Secondary hyperparathyroidism of renal origin: Secondary | ICD-10-CM | POA: Diagnosis not present

## 2021-02-19 DIAGNOSIS — N186 End stage renal disease: Secondary | ICD-10-CM | POA: Diagnosis not present

## 2021-02-19 DIAGNOSIS — D509 Iron deficiency anemia, unspecified: Secondary | ICD-10-CM | POA: Diagnosis not present

## 2021-02-19 DIAGNOSIS — Z992 Dependence on renal dialysis: Secondary | ICD-10-CM | POA: Diagnosis not present

## 2021-02-21 DIAGNOSIS — Z992 Dependence on renal dialysis: Secondary | ICD-10-CM | POA: Diagnosis not present

## 2021-02-21 DIAGNOSIS — D509 Iron deficiency anemia, unspecified: Secondary | ICD-10-CM | POA: Diagnosis not present

## 2021-02-21 DIAGNOSIS — D631 Anemia in chronic kidney disease: Secondary | ICD-10-CM | POA: Diagnosis not present

## 2021-02-21 DIAGNOSIS — N186 End stage renal disease: Secondary | ICD-10-CM | POA: Diagnosis not present

## 2021-02-21 DIAGNOSIS — N2581 Secondary hyperparathyroidism of renal origin: Secondary | ICD-10-CM | POA: Diagnosis not present

## 2021-02-23 DIAGNOSIS — N186 End stage renal disease: Secondary | ICD-10-CM | POA: Diagnosis not present

## 2021-02-23 DIAGNOSIS — Z992 Dependence on renal dialysis: Secondary | ICD-10-CM | POA: Diagnosis not present

## 2021-02-23 DIAGNOSIS — N2581 Secondary hyperparathyroidism of renal origin: Secondary | ICD-10-CM | POA: Diagnosis not present

## 2021-02-23 DIAGNOSIS — D509 Iron deficiency anemia, unspecified: Secondary | ICD-10-CM | POA: Diagnosis not present

## 2021-02-23 DIAGNOSIS — D631 Anemia in chronic kidney disease: Secondary | ICD-10-CM | POA: Diagnosis not present

## 2021-02-26 DIAGNOSIS — D631 Anemia in chronic kidney disease: Secondary | ICD-10-CM | POA: Diagnosis not present

## 2021-02-26 DIAGNOSIS — N186 End stage renal disease: Secondary | ICD-10-CM | POA: Diagnosis not present

## 2021-02-26 DIAGNOSIS — D509 Iron deficiency anemia, unspecified: Secondary | ICD-10-CM | POA: Diagnosis not present

## 2021-02-26 DIAGNOSIS — Z992 Dependence on renal dialysis: Secondary | ICD-10-CM | POA: Diagnosis not present

## 2021-02-26 DIAGNOSIS — N2581 Secondary hyperparathyroidism of renal origin: Secondary | ICD-10-CM | POA: Diagnosis not present

## 2021-02-28 DIAGNOSIS — Z992 Dependence on renal dialysis: Secondary | ICD-10-CM | POA: Diagnosis not present

## 2021-02-28 DIAGNOSIS — D509 Iron deficiency anemia, unspecified: Secondary | ICD-10-CM | POA: Diagnosis not present

## 2021-02-28 DIAGNOSIS — N186 End stage renal disease: Secondary | ICD-10-CM | POA: Diagnosis not present

## 2021-02-28 DIAGNOSIS — D631 Anemia in chronic kidney disease: Secondary | ICD-10-CM | POA: Diagnosis not present

## 2021-02-28 DIAGNOSIS — N2581 Secondary hyperparathyroidism of renal origin: Secondary | ICD-10-CM | POA: Diagnosis not present

## 2021-03-02 DIAGNOSIS — D631 Anemia in chronic kidney disease: Secondary | ICD-10-CM | POA: Diagnosis not present

## 2021-03-02 DIAGNOSIS — N2581 Secondary hyperparathyroidism of renal origin: Secondary | ICD-10-CM | POA: Diagnosis not present

## 2021-03-02 DIAGNOSIS — Z992 Dependence on renal dialysis: Secondary | ICD-10-CM | POA: Diagnosis not present

## 2021-03-02 DIAGNOSIS — D509 Iron deficiency anemia, unspecified: Secondary | ICD-10-CM | POA: Diagnosis not present

## 2021-03-02 DIAGNOSIS — N186 End stage renal disease: Secondary | ICD-10-CM | POA: Diagnosis not present

## 2021-03-05 DIAGNOSIS — I129 Hypertensive chronic kidney disease with stage 1 through stage 4 chronic kidney disease, or unspecified chronic kidney disease: Secondary | ICD-10-CM | POA: Diagnosis not present

## 2021-03-05 DIAGNOSIS — N2581 Secondary hyperparathyroidism of renal origin: Secondary | ICD-10-CM | POA: Diagnosis not present

## 2021-03-05 DIAGNOSIS — Z992 Dependence on renal dialysis: Secondary | ICD-10-CM | POA: Diagnosis not present

## 2021-03-05 DIAGNOSIS — N186 End stage renal disease: Secondary | ICD-10-CM | POA: Diagnosis not present

## 2021-03-05 NOTE — Progress Notes (Signed)
Cardiology Office Note   Date:  03/06/2021   ID:  Yvonne Powell, DOB 05-Mar-1929, MRN [id number]  PCP:  Yvonne Hatchet, MD  Cardiologist:   Yvonne Latch, MD   No chief complaint on file.   History of Present Illness: Yvonne Powell is a 87 y.o. female with hypertension, hyperlipidemia,  chronic diastolic heart failure, prior DVT, ESRD on HD, and hypothyroidism who presents for follow-up.  She was admitted with acute on chronic shortness of breath and edema 09/2019.  Echo at that time revealed LVEF 50 to 55% with grade 1 diastolic dysfunction and a small pericardial effusion.  High-sensitivity troponin was elevated but thought to be due to demand ischemia.  She was diuresed with IV Lasix and discharged on oral Lasix.  She has an AV fistula in place and follows with nephrology.  She was started on hemodialysis and her BP has been better controlled. She was seen in ED on 09/20/20 for allergic reaction to calcitriol. She experiences throat tightness but exhibited no symptoms of anaphylactic shock. She has followed up with [name] several times since discharge.  She notes that she is tired after her HD sessions. She saw [name], NP on 08/2020 for occasional low blood pressure with dialysis.   Today, she has been doing good. She reports being tired after dialysis and she is unable to sleep at the center. However, she is able to stay for the whole session. Her blood pressure after dialysis has improved after she raised her dry weight and the rate was slowed from 400 to 300. Her hardest days are Tuesdays because they have to pull off more fluid. Eating and taking a nap after her sessions improve the fatigue. For exercise, she does not like walking. However, she performs stretches in the morning. She does report becoming short of breath after daily activities like making the bed. She denies any palpitations, chest pain, lightheadedness, headaches, syncope, orthopnea, PND, or lower extremity  edema.  Past Medical History:  Diagnosis Date   Anemia    due to kidney   Chronic diastolic heart failure ([place]) 09/08/2019   DVT (deep venous thrombosis) (HCC)    hx   Gallstones 12/2018   Hypertension    Renal disorder    Thyroid disease    graves dx    Past Surgical History:  Procedure Laterality Date   AV FISTULA PLACEMENT Right 04/07/2017   Procedure: ARTERIOVENOUS (AV) FISTULA CREATION;  Surgeon: Elam Dutch, MD;  Location: [facility];  Service: Vascular;  Laterality: Right;   CHOLECYSTECTOMY N/A 12/23/2018   Procedure: LAPAROSCOPIC CHOLECYSTECTOMY;  Surgeon: Erroll Luna, MD;  Location: [facility];  Service: General;  Laterality: N/A;   COLONOSCOPY     DILATION AND CURETTAGE OF UTERUS     EYE SURGERY     cactaracts   LIPOMA EXCISION     LUMBAR EPIDURAL INJECTION  2018     Current Outpatient Medications  Medication Sig Dispense Refill   acetaminophen (TYLENOL) 325 MG tablet Take 325 mg by mouth every 6 (six) hours as needed for mild pain (or headaches).     calcium acetate (PHOSLO) 667 MG capsule Take 1,334 mg by mouth 3 (three) times daily with meals.     Iron-FA-DSS-B Cmplx-Vit C (NEPHRON FA) TABS Take 1 tablet by mouth daily before supper.     levothyroxine (SYNTHROID, LEVOTHROID) 88 MCG tablet Take 88 mcg by mouth daily before breakfast.     lidocaine-prilocaine (EMLA) cream Apply 1 application topically Every  Tuesday,Thursday,and Saturday with dialysis.     No current facility-administered medications for this visit.    Allergies:   Doxycycline, Fish allergy, Fish-derived products, Iodine, Shellfish allergy, Shellfish-derived products, Clindamycin/lincomycin, Compazine [prochlorperazine edisylate], Sulfa antibiotics, Calcitriol, and Penicillins    Social History:  The patient  reports that she has never smoked. She has never used smokeless tobacco. She reports that she does not drink alcohol and does not use drugs.   Family History:  The patient's family history  includes Congestive Heart Failure (age of onset: 23) in her maternal grandmother; Hypertension in her maternal grandmother.    ROS:  Please see the history of present illness.   (+) Fatigue (+) Shortness of breath  All other systems are reviewed and negative.   PHYSICAL EXAM: VS:  BP 132/72 (BP Location: Left Arm, Patient Position: Sitting, Cuff Size: Normal)   Pulse 85   Ht 5\' 3"  (1.6 m)   Wt 119 lb 14.4 oz (54.4 kg)   BMI 21.24 kg/m  , BMI Body mass index is 21.24 kg/m. GENERAL:  Well appearing HEENT:  Pupils equal round and reactive, fundi not visualized, oral mucosa unremarkable NECK:  No jugular venous distention, waveform within normal limits, carotid upstroke brisk and symmetric, no bruits, no thyromegaly LUNGS:  Clear to auscultation bilaterally HEART:  RRR.  PMI not displaced or sustained, S1 and S2 within normal limits, no S3, no S4, no clicks, no rubs, III/VI systolic murmurs ABD:  Flat, positive bowel sounds normal in frequency in pitch, no bruits, no rebound, no guarding, no midline pulsatile mass, no hepatomegaly, no splenomegaly EXT:  2 plus pulses throughout, no edema, no cyanosis no clubbing. R AC fistula with bruit SKIN:  No rashes no nodules NEURO:  Cranial nerves II through XII grossly intact, motor grossly intact throughout PSYCH:  Cognitively intact, oriented to person place and time  EKG:   09/20/20 (ED): Sinus rhythm, Rate 83 bpm, Prolonged PR interval 03/06/21: Sinus rhythm, rate 85 bpm, prior septal infarct  Echo 09/2019:  1. Left ventricular ejection fraction, by estimation, is 50 to 55%. The  left ventricle has low normal function. Septal-lateral dyssynchrony  consistent with LBBB. There is moderate left ventricular hypertrophy. Left  ventricular diastolic parameters are  consistent with Grade I diastolic dysfunction (impaired relaxation).   2. Right ventricular systolic function is normal. The right ventricular  size is normal. Tricuspid regurgitation  signal is inadequate for assessing  PA pressure.   3. Left atrial size was moderately dilated.   4. The mitral valve is degenerative. Mild mitral valve regurgitation. No  evidence of mitral stenosis.   5. The aortic valve is tricuspid. Aortic valve regurgitation is not  visualized. Mild aortic valve sclerosis is present, with no evidence of  aortic valve stenosis.   6. The inferior vena cava is dilated in size with <50% respiratory  variability, suggesting right atrial pressure of 15 mmHg.   7. Small circumferential pericardial effusion, no evidence for tamponade.  I do not think that the dilated IVC is related to the pericardial  effusion.   Recent Labs: 09/20/2020: BUN 11; Creatinine, Ser 2.59; Hemoglobin 10.7; Platelets 198; Potassium 3.2; Sodium 136    Lipid Panel No results found for: CHOL, TRIG, HDL, CHOLHDL, VLDL, LDLCALC, LDLDIRECT    Wt Readings from Last 3 Encounters:  03/06/21 119 lb 14.4 oz (54.4 kg)  09/20/20 117 lb 4.6 oz (53.2 kg)  08/29/20 117 lb 3.2 oz (53.2 kg)      ASSESSMENT AND  PLAN:  Hypertension BP is well-controlled with HD.  She has been able to tolerate her sessions without hypotension since they raised her dry weight.  Chronic diastolic heart failure ([place]) She is euvolemic.  Volume status stable with HD.  We discussed following up with [place] as needed. She would like to follow up in one year.      Current medicines are reviewed at length with the patient today.  The patient does not have concerns regarding medicines.  The following changes have been made:  no change  Labs/ tests ordered today include:  No orders of the defined types were placed in this encounter.    Disposition:   FU with Jesalyn Finazzo C. Oval Linsey, MD, [facility] in 1 year.    I,Mykaella Javier,acting as a scribe for Yvonne Latch, MD.,have documented all relevant documentation on the behalf of Yvonne Latch, MD,as directed by  Yvonne Latch, MD while in the presence of Yvonne Latch, MD.  I, Viking Oval Linsey, MD have reviewed all documentation for this visit.  The documentation of the exam, diagnosis, procedures, and orders on 03/06/2021 are all accurate and complete.   Signed, Amonda Brillhart C. Oval Linsey, MD, [facility]  03/06/2021 10:15 AM    [place]

## 2021-03-06 ENCOUNTER — Encounter (HOSPITAL_BASED_OUTPATIENT_CLINIC_OR_DEPARTMENT_OTHER): Payer: Self-pay | Admitting: Cardiovascular Disease

## 2021-03-06 ENCOUNTER — Ambulatory Visit (INDEPENDENT_AMBULATORY_CARE_PROVIDER_SITE_OTHER): Payer: Medicare Other | Admitting: Cardiovascular Disease

## 2021-03-06 ENCOUNTER — Other Ambulatory Visit: Payer: Self-pay

## 2021-03-06 VITALS — BP 132/72 | HR 85 | Ht 63.0 in | Wt 119.9 lb

## 2021-03-06 DIAGNOSIS — I248 Other forms of acute ischemic heart disease: Secondary | ICD-10-CM

## 2021-03-06 DIAGNOSIS — I5032 Chronic diastolic (congestive) heart failure: Secondary | ICD-10-CM

## 2021-03-06 DIAGNOSIS — I151 Hypertension secondary to other renal disorders: Secondary | ICD-10-CM | POA: Diagnosis not present

## 2021-03-06 DIAGNOSIS — N2889 Other specified disorders of kidney and ureter: Secondary | ICD-10-CM

## 2021-03-06 NOTE — Assessment & Plan Note (Signed)
She is euvolemic.  Volume status stable with HD.  We discussed following up with [place] as needed. She would like to follow up in one year.

## 2021-03-06 NOTE — Patient Instructions (Signed)
Medication Instructions:  ?Your physician recommends that you continue on your current medications as directed. Please refer to the Current Medication list given to you today.  ? ?*If you need a refill on your cardiac medications before your next appointment, please call your pharmacy* ? ?Lab Work: ?NONE ? ?Testing/Procedures: ?NONE ? ?Follow-Up: ?At [facility], you and your health needs are our priority.  As part of our continuing mission to provide you with exceptional heart care, we have created designated Provider Care Teams.  These Care Teams include your primary Cardiologist (physician) and Advanced Practice Providers (APPs -  Physician Assistants and Nurse Practitioners) who all work together to provide you with the care you need, when you need it. ? ?We recommend signing up for the patient portal called "MyChart".  Sign up information is provided on this After Visit Summary.  MyChart is used to connect with patients for Virtual Visits (Telemedicine).  Patients are able to view lab/test results, encounter notes, upcoming appointments, etc.  Non-urgent messages can be sent to your provider as well.   ?To learn more about what you can do with MyChart, go to [email or web address].   ? ?Your next appointment:   ?12 month(s) ? ?The format for your next appointment:   ?In Person ? ?Provider:   ?Tiffany [employer], MD  ? ? ? ? ? ? ?

## 2021-03-06 NOTE — Assessment & Plan Note (Signed)
BP is well-controlled with HD.  She has been able to tolerate her sessions without hypotension since they raised her dry weight.

## 2021-03-07 DIAGNOSIS — N186 End stage renal disease: Secondary | ICD-10-CM | POA: Diagnosis not present

## 2021-03-07 DIAGNOSIS — Z992 Dependence on renal dialysis: Secondary | ICD-10-CM | POA: Diagnosis not present

## 2021-03-07 DIAGNOSIS — N2581 Secondary hyperparathyroidism of renal origin: Secondary | ICD-10-CM | POA: Diagnosis not present

## 2021-03-09 DIAGNOSIS — N2581 Secondary hyperparathyroidism of renal origin: Secondary | ICD-10-CM | POA: Diagnosis not present

## 2021-03-09 DIAGNOSIS — N186 End stage renal disease: Secondary | ICD-10-CM | POA: Diagnosis not present

## 2021-03-09 DIAGNOSIS — Z992 Dependence on renal dialysis: Secondary | ICD-10-CM | POA: Diagnosis not present

## 2021-03-12 DIAGNOSIS — Z992 Dependence on renal dialysis: Secondary | ICD-10-CM | POA: Diagnosis not present

## 2021-03-12 DIAGNOSIS — N2581 Secondary hyperparathyroidism of renal origin: Secondary | ICD-10-CM | POA: Diagnosis not present

## 2021-03-12 DIAGNOSIS — N186 End stage renal disease: Secondary | ICD-10-CM | POA: Diagnosis not present

## 2021-03-14 DIAGNOSIS — N186 End stage renal disease: Secondary | ICD-10-CM | POA: Diagnosis not present

## 2021-03-14 DIAGNOSIS — Z992 Dependence on renal dialysis: Secondary | ICD-10-CM | POA: Diagnosis not present

## 2021-03-14 DIAGNOSIS — N2581 Secondary hyperparathyroidism of renal origin: Secondary | ICD-10-CM | POA: Diagnosis not present

## 2021-03-16 DIAGNOSIS — N2581 Secondary hyperparathyroidism of renal origin: Secondary | ICD-10-CM | POA: Diagnosis not present

## 2021-03-16 DIAGNOSIS — N186 End stage renal disease: Secondary | ICD-10-CM | POA: Diagnosis not present

## 2021-03-16 DIAGNOSIS — Z992 Dependence on renal dialysis: Secondary | ICD-10-CM | POA: Diagnosis not present

## 2021-03-19 DIAGNOSIS — N186 End stage renal disease: Secondary | ICD-10-CM | POA: Diagnosis not present

## 2021-03-19 DIAGNOSIS — Z992 Dependence on renal dialysis: Secondary | ICD-10-CM | POA: Diagnosis not present

## 2021-03-19 DIAGNOSIS — N2581 Secondary hyperparathyroidism of renal origin: Secondary | ICD-10-CM | POA: Diagnosis not present

## 2021-03-21 DIAGNOSIS — N186 End stage renal disease: Secondary | ICD-10-CM | POA: Diagnosis not present

## 2021-03-21 DIAGNOSIS — Z992 Dependence on renal dialysis: Secondary | ICD-10-CM | POA: Diagnosis not present

## 2021-03-21 DIAGNOSIS — N2581 Secondary hyperparathyroidism of renal origin: Secondary | ICD-10-CM | POA: Diagnosis not present

## 2021-03-22 DIAGNOSIS — L570 Actinic keratosis: Secondary | ICD-10-CM | POA: Diagnosis not present

## 2021-03-22 DIAGNOSIS — D1801 Hemangioma of skin and subcutaneous tissue: Secondary | ICD-10-CM | POA: Diagnosis not present

## 2021-03-22 DIAGNOSIS — L720 Epidermal cyst: Secondary | ICD-10-CM | POA: Diagnosis not present

## 2021-03-22 DIAGNOSIS — L821 Other seborrheic keratosis: Secondary | ICD-10-CM | POA: Diagnosis not present

## 2021-03-22 DIAGNOSIS — L814 Other melanin hyperpigmentation: Secondary | ICD-10-CM | POA: Diagnosis not present

## 2021-03-22 DIAGNOSIS — L738 Other specified follicular disorders: Secondary | ICD-10-CM | POA: Diagnosis not present

## 2021-03-22 DIAGNOSIS — L820 Inflamed seborrheic keratosis: Secondary | ICD-10-CM | POA: Diagnosis not present

## 2021-03-23 DIAGNOSIS — N186 End stage renal disease: Secondary | ICD-10-CM | POA: Diagnosis not present

## 2021-03-23 DIAGNOSIS — Z992 Dependence on renal dialysis: Secondary | ICD-10-CM | POA: Diagnosis not present

## 2021-03-23 DIAGNOSIS — N2581 Secondary hyperparathyroidism of renal origin: Secondary | ICD-10-CM | POA: Diagnosis not present

## 2021-03-26 DIAGNOSIS — N2581 Secondary hyperparathyroidism of renal origin: Secondary | ICD-10-CM | POA: Diagnosis not present

## 2021-03-26 DIAGNOSIS — Z992 Dependence on renal dialysis: Secondary | ICD-10-CM | POA: Diagnosis not present

## 2021-03-26 DIAGNOSIS — N186 End stage renal disease: Secondary | ICD-10-CM | POA: Diagnosis not present

## 2021-03-29 DIAGNOSIS — N2581 Secondary hyperparathyroidism of renal origin: Secondary | ICD-10-CM | POA: Diagnosis not present

## 2021-03-29 DIAGNOSIS — N186 End stage renal disease: Secondary | ICD-10-CM | POA: Diagnosis not present

## 2021-03-29 DIAGNOSIS — Z992 Dependence on renal dialysis: Secondary | ICD-10-CM | POA: Diagnosis not present

## 2021-03-31 DIAGNOSIS — N2581 Secondary hyperparathyroidism of renal origin: Secondary | ICD-10-CM | POA: Diagnosis not present

## 2021-03-31 DIAGNOSIS — Z992 Dependence on renal dialysis: Secondary | ICD-10-CM | POA: Diagnosis not present

## 2021-03-31 DIAGNOSIS — N186 End stage renal disease: Secondary | ICD-10-CM | POA: Diagnosis not present

## 2021-04-02 DIAGNOSIS — N2581 Secondary hyperparathyroidism of renal origin: Secondary | ICD-10-CM | POA: Diagnosis not present

## 2021-04-02 DIAGNOSIS — N186 End stage renal disease: Secondary | ICD-10-CM | POA: Diagnosis not present

## 2021-04-02 DIAGNOSIS — Z992 Dependence on renal dialysis: Secondary | ICD-10-CM | POA: Diagnosis not present

## 2021-04-04 DIAGNOSIS — N186 End stage renal disease: Secondary | ICD-10-CM | POA: Diagnosis not present

## 2021-04-04 DIAGNOSIS — Z992 Dependence on renal dialysis: Secondary | ICD-10-CM | POA: Diagnosis not present

## 2021-04-04 DIAGNOSIS — I129 Hypertensive chronic kidney disease with stage 1 through stage 4 chronic kidney disease, or unspecified chronic kidney disease: Secondary | ICD-10-CM | POA: Diagnosis not present

## 2021-04-04 DIAGNOSIS — N2581 Secondary hyperparathyroidism of renal origin: Secondary | ICD-10-CM | POA: Diagnosis not present

## 2021-04-06 DIAGNOSIS — N2581 Secondary hyperparathyroidism of renal origin: Secondary | ICD-10-CM | POA: Diagnosis not present

## 2021-04-06 DIAGNOSIS — Z992 Dependence on renal dialysis: Secondary | ICD-10-CM | POA: Diagnosis not present

## 2021-04-06 DIAGNOSIS — N186 End stage renal disease: Secondary | ICD-10-CM | POA: Diagnosis not present

## 2021-04-09 DIAGNOSIS — N2581 Secondary hyperparathyroidism of renal origin: Secondary | ICD-10-CM | POA: Diagnosis not present

## 2021-04-09 DIAGNOSIS — N186 End stage renal disease: Secondary | ICD-10-CM | POA: Diagnosis not present

## 2021-04-09 DIAGNOSIS — Z992 Dependence on renal dialysis: Secondary | ICD-10-CM | POA: Diagnosis not present

## 2021-04-11 DIAGNOSIS — N2581 Secondary hyperparathyroidism of renal origin: Secondary | ICD-10-CM | POA: Diagnosis not present

## 2021-04-11 DIAGNOSIS — N186 End stage renal disease: Secondary | ICD-10-CM | POA: Diagnosis not present

## 2021-04-11 DIAGNOSIS — Z992 Dependence on renal dialysis: Secondary | ICD-10-CM | POA: Diagnosis not present

## 2021-04-13 DIAGNOSIS — N2581 Secondary hyperparathyroidism of renal origin: Secondary | ICD-10-CM | POA: Diagnosis not present

## 2021-04-13 DIAGNOSIS — Z992 Dependence on renal dialysis: Secondary | ICD-10-CM | POA: Diagnosis not present

## 2021-04-13 DIAGNOSIS — N186 End stage renal disease: Secondary | ICD-10-CM | POA: Diagnosis not present

## 2021-04-16 DIAGNOSIS — N186 End stage renal disease: Secondary | ICD-10-CM | POA: Diagnosis not present

## 2021-04-16 DIAGNOSIS — Z992 Dependence on renal dialysis: Secondary | ICD-10-CM | POA: Diagnosis not present

## 2021-04-16 DIAGNOSIS — N2581 Secondary hyperparathyroidism of renal origin: Secondary | ICD-10-CM | POA: Diagnosis not present

## 2021-04-18 DIAGNOSIS — N186 End stage renal disease: Secondary | ICD-10-CM | POA: Diagnosis not present

## 2021-04-18 DIAGNOSIS — N2581 Secondary hyperparathyroidism of renal origin: Secondary | ICD-10-CM | POA: Diagnosis not present

## 2021-04-18 DIAGNOSIS — Z992 Dependence on renal dialysis: Secondary | ICD-10-CM | POA: Diagnosis not present

## 2021-04-20 DIAGNOSIS — N2581 Secondary hyperparathyroidism of renal origin: Secondary | ICD-10-CM | POA: Diagnosis not present

## 2021-04-20 DIAGNOSIS — N186 End stage renal disease: Secondary | ICD-10-CM | POA: Diagnosis not present

## 2021-04-20 DIAGNOSIS — Z992 Dependence on renal dialysis: Secondary | ICD-10-CM | POA: Diagnosis not present

## 2021-04-23 DIAGNOSIS — N186 End stage renal disease: Secondary | ICD-10-CM | POA: Diagnosis not present

## 2021-04-23 DIAGNOSIS — Z992 Dependence on renal dialysis: Secondary | ICD-10-CM | POA: Diagnosis not present

## 2021-04-23 DIAGNOSIS — N2581 Secondary hyperparathyroidism of renal origin: Secondary | ICD-10-CM | POA: Diagnosis not present

## 2021-04-25 DIAGNOSIS — N186 End stage renal disease: Secondary | ICD-10-CM | POA: Diagnosis not present

## 2021-04-25 DIAGNOSIS — Z992 Dependence on renal dialysis: Secondary | ICD-10-CM | POA: Diagnosis not present

## 2021-04-25 DIAGNOSIS — N2581 Secondary hyperparathyroidism of renal origin: Secondary | ICD-10-CM | POA: Diagnosis not present

## 2021-04-27 DIAGNOSIS — N186 End stage renal disease: Secondary | ICD-10-CM | POA: Diagnosis not present

## 2021-04-27 DIAGNOSIS — N2581 Secondary hyperparathyroidism of renal origin: Secondary | ICD-10-CM | POA: Diagnosis not present

## 2021-04-27 DIAGNOSIS — Z992 Dependence on renal dialysis: Secondary | ICD-10-CM | POA: Diagnosis not present

## 2021-04-30 DIAGNOSIS — N2581 Secondary hyperparathyroidism of renal origin: Secondary | ICD-10-CM | POA: Diagnosis not present

## 2021-04-30 DIAGNOSIS — N186 End stage renal disease: Secondary | ICD-10-CM | POA: Diagnosis not present

## 2021-04-30 DIAGNOSIS — Z992 Dependence on renal dialysis: Secondary | ICD-10-CM | POA: Diagnosis not present

## 2021-05-02 DIAGNOSIS — Z992 Dependence on renal dialysis: Secondary | ICD-10-CM | POA: Diagnosis not present

## 2021-05-02 DIAGNOSIS — N2581 Secondary hyperparathyroidism of renal origin: Secondary | ICD-10-CM | POA: Diagnosis not present

## 2021-05-02 DIAGNOSIS — N186 End stage renal disease: Secondary | ICD-10-CM | POA: Diagnosis not present

## 2021-05-04 DIAGNOSIS — N186 End stage renal disease: Secondary | ICD-10-CM | POA: Diagnosis not present

## 2021-05-04 DIAGNOSIS — Z992 Dependence on renal dialysis: Secondary | ICD-10-CM | POA: Diagnosis not present

## 2021-05-04 DIAGNOSIS — N2581 Secondary hyperparathyroidism of renal origin: Secondary | ICD-10-CM | POA: Diagnosis not present

## 2021-05-05 DIAGNOSIS — N186 End stage renal disease: Secondary | ICD-10-CM | POA: Diagnosis not present

## 2021-05-05 DIAGNOSIS — I129 Hypertensive chronic kidney disease with stage 1 through stage 4 chronic kidney disease, or unspecified chronic kidney disease: Secondary | ICD-10-CM | POA: Diagnosis not present

## 2021-05-05 DIAGNOSIS — Z992 Dependence on renal dialysis: Secondary | ICD-10-CM | POA: Diagnosis not present

## 2021-05-07 DIAGNOSIS — Z992 Dependence on renal dialysis: Secondary | ICD-10-CM | POA: Diagnosis not present

## 2021-05-07 DIAGNOSIS — D509 Iron deficiency anemia, unspecified: Secondary | ICD-10-CM | POA: Diagnosis not present

## 2021-05-07 DIAGNOSIS — N186 End stage renal disease: Secondary | ICD-10-CM | POA: Diagnosis not present

## 2021-05-07 DIAGNOSIS — N2581 Secondary hyperparathyroidism of renal origin: Secondary | ICD-10-CM | POA: Diagnosis not present

## 2021-05-09 DIAGNOSIS — D509 Iron deficiency anemia, unspecified: Secondary | ICD-10-CM | POA: Diagnosis not present

## 2021-05-09 DIAGNOSIS — N2581 Secondary hyperparathyroidism of renal origin: Secondary | ICD-10-CM | POA: Diagnosis not present

## 2021-05-09 DIAGNOSIS — Z992 Dependence on renal dialysis: Secondary | ICD-10-CM | POA: Diagnosis not present

## 2021-05-09 DIAGNOSIS — N186 End stage renal disease: Secondary | ICD-10-CM | POA: Diagnosis not present

## 2021-05-11 DIAGNOSIS — N186 End stage renal disease: Secondary | ICD-10-CM | POA: Diagnosis not present

## 2021-05-11 DIAGNOSIS — D509 Iron deficiency anemia, unspecified: Secondary | ICD-10-CM | POA: Diagnosis not present

## 2021-05-11 DIAGNOSIS — Z992 Dependence on renal dialysis: Secondary | ICD-10-CM | POA: Diagnosis not present

## 2021-05-11 DIAGNOSIS — N2581 Secondary hyperparathyroidism of renal origin: Secondary | ICD-10-CM | POA: Diagnosis not present

## 2021-05-14 DIAGNOSIS — D509 Iron deficiency anemia, unspecified: Secondary | ICD-10-CM | POA: Diagnosis not present

## 2021-05-14 DIAGNOSIS — N186 End stage renal disease: Secondary | ICD-10-CM | POA: Diagnosis not present

## 2021-05-14 DIAGNOSIS — N2581 Secondary hyperparathyroidism of renal origin: Secondary | ICD-10-CM | POA: Diagnosis not present

## 2021-05-14 DIAGNOSIS — Z992 Dependence on renal dialysis: Secondary | ICD-10-CM | POA: Diagnosis not present

## 2021-05-16 DIAGNOSIS — N186 End stage renal disease: Secondary | ICD-10-CM | POA: Diagnosis not present

## 2021-05-16 DIAGNOSIS — N2581 Secondary hyperparathyroidism of renal origin: Secondary | ICD-10-CM | POA: Diagnosis not present

## 2021-05-16 DIAGNOSIS — D509 Iron deficiency anemia, unspecified: Secondary | ICD-10-CM | POA: Diagnosis not present

## 2021-05-16 DIAGNOSIS — Z992 Dependence on renal dialysis: Secondary | ICD-10-CM | POA: Diagnosis not present

## 2021-05-18 DIAGNOSIS — D509 Iron deficiency anemia, unspecified: Secondary | ICD-10-CM | POA: Diagnosis not present

## 2021-05-18 DIAGNOSIS — Z992 Dependence on renal dialysis: Secondary | ICD-10-CM | POA: Diagnosis not present

## 2021-05-18 DIAGNOSIS — N186 End stage renal disease: Secondary | ICD-10-CM | POA: Diagnosis not present

## 2021-05-18 DIAGNOSIS — N2581 Secondary hyperparathyroidism of renal origin: Secondary | ICD-10-CM | POA: Diagnosis not present

## 2021-05-21 DIAGNOSIS — N186 End stage renal disease: Secondary | ICD-10-CM | POA: Diagnosis not present

## 2021-05-21 DIAGNOSIS — Z992 Dependence on renal dialysis: Secondary | ICD-10-CM | POA: Diagnosis not present

## 2021-05-21 DIAGNOSIS — N2581 Secondary hyperparathyroidism of renal origin: Secondary | ICD-10-CM | POA: Diagnosis not present

## 2021-05-21 DIAGNOSIS — D509 Iron deficiency anemia, unspecified: Secondary | ICD-10-CM | POA: Diagnosis not present

## 2021-05-23 DIAGNOSIS — D509 Iron deficiency anemia, unspecified: Secondary | ICD-10-CM | POA: Diagnosis not present

## 2021-05-23 DIAGNOSIS — Z992 Dependence on renal dialysis: Secondary | ICD-10-CM | POA: Diagnosis not present

## 2021-05-23 DIAGNOSIS — N186 End stage renal disease: Secondary | ICD-10-CM | POA: Diagnosis not present

## 2021-05-23 DIAGNOSIS — N2581 Secondary hyperparathyroidism of renal origin: Secondary | ICD-10-CM | POA: Diagnosis not present

## 2021-05-25 DIAGNOSIS — N186 End stage renal disease: Secondary | ICD-10-CM | POA: Diagnosis not present

## 2021-05-25 DIAGNOSIS — N2581 Secondary hyperparathyroidism of renal origin: Secondary | ICD-10-CM | POA: Diagnosis not present

## 2021-05-25 DIAGNOSIS — D509 Iron deficiency anemia, unspecified: Secondary | ICD-10-CM | POA: Diagnosis not present

## 2021-05-25 DIAGNOSIS — Z992 Dependence on renal dialysis: Secondary | ICD-10-CM | POA: Diagnosis not present

## 2021-05-28 DIAGNOSIS — D509 Iron deficiency anemia, unspecified: Secondary | ICD-10-CM | POA: Diagnosis not present

## 2021-05-28 DIAGNOSIS — Z992 Dependence on renal dialysis: Secondary | ICD-10-CM | POA: Diagnosis not present

## 2021-05-28 DIAGNOSIS — N186 End stage renal disease: Secondary | ICD-10-CM | POA: Diagnosis not present

## 2021-05-28 DIAGNOSIS — N2581 Secondary hyperparathyroidism of renal origin: Secondary | ICD-10-CM | POA: Diagnosis not present

## 2021-05-30 DIAGNOSIS — N2581 Secondary hyperparathyroidism of renal origin: Secondary | ICD-10-CM | POA: Diagnosis not present

## 2021-05-30 DIAGNOSIS — D509 Iron deficiency anemia, unspecified: Secondary | ICD-10-CM | POA: Diagnosis not present

## 2021-05-30 DIAGNOSIS — Z992 Dependence on renal dialysis: Secondary | ICD-10-CM | POA: Diagnosis not present

## 2021-05-30 DIAGNOSIS — N186 End stage renal disease: Secondary | ICD-10-CM | POA: Diagnosis not present

## 2021-06-01 DIAGNOSIS — N186 End stage renal disease: Secondary | ICD-10-CM | POA: Diagnosis not present

## 2021-06-01 DIAGNOSIS — N2581 Secondary hyperparathyroidism of renal origin: Secondary | ICD-10-CM | POA: Diagnosis not present

## 2021-06-01 DIAGNOSIS — D509 Iron deficiency anemia, unspecified: Secondary | ICD-10-CM | POA: Diagnosis not present

## 2021-06-01 DIAGNOSIS — Z992 Dependence on renal dialysis: Secondary | ICD-10-CM | POA: Diagnosis not present

## 2021-06-04 DIAGNOSIS — N2581 Secondary hyperparathyroidism of renal origin: Secondary | ICD-10-CM | POA: Diagnosis not present

## 2021-06-04 DIAGNOSIS — N186 End stage renal disease: Secondary | ICD-10-CM | POA: Diagnosis not present

## 2021-06-04 DIAGNOSIS — D509 Iron deficiency anemia, unspecified: Secondary | ICD-10-CM | POA: Diagnosis not present

## 2021-06-04 DIAGNOSIS — Z992 Dependence on renal dialysis: Secondary | ICD-10-CM | POA: Diagnosis not present

## 2021-06-05 DIAGNOSIS — Z992 Dependence on renal dialysis: Secondary | ICD-10-CM | POA: Diagnosis not present

## 2021-06-05 DIAGNOSIS — N186 End stage renal disease: Secondary | ICD-10-CM | POA: Diagnosis not present

## 2021-06-05 DIAGNOSIS — I129 Hypertensive chronic kidney disease with stage 1 through stage 4 chronic kidney disease, or unspecified chronic kidney disease: Secondary | ICD-10-CM | POA: Diagnosis not present

## 2021-06-06 DIAGNOSIS — N2581 Secondary hyperparathyroidism of renal origin: Secondary | ICD-10-CM | POA: Diagnosis not present

## 2021-06-06 DIAGNOSIS — Z992 Dependence on renal dialysis: Secondary | ICD-10-CM | POA: Diagnosis not present

## 2021-06-06 DIAGNOSIS — N186 End stage renal disease: Secondary | ICD-10-CM | POA: Diagnosis not present

## 2021-06-06 DIAGNOSIS — D509 Iron deficiency anemia, unspecified: Secondary | ICD-10-CM | POA: Diagnosis not present

## 2021-06-08 DIAGNOSIS — Z992 Dependence on renal dialysis: Secondary | ICD-10-CM | POA: Diagnosis not present

## 2021-06-08 DIAGNOSIS — D509 Iron deficiency anemia, unspecified: Secondary | ICD-10-CM | POA: Diagnosis not present

## 2021-06-08 DIAGNOSIS — N186 End stage renal disease: Secondary | ICD-10-CM | POA: Diagnosis not present

## 2021-06-08 DIAGNOSIS — N2581 Secondary hyperparathyroidism of renal origin: Secondary | ICD-10-CM | POA: Diagnosis not present

## 2021-06-11 DIAGNOSIS — Z992 Dependence on renal dialysis: Secondary | ICD-10-CM | POA: Diagnosis not present

## 2021-06-11 DIAGNOSIS — D509 Iron deficiency anemia, unspecified: Secondary | ICD-10-CM | POA: Diagnosis not present

## 2021-06-11 DIAGNOSIS — N2581 Secondary hyperparathyroidism of renal origin: Secondary | ICD-10-CM | POA: Diagnosis not present

## 2021-06-11 DIAGNOSIS — N186 End stage renal disease: Secondary | ICD-10-CM | POA: Diagnosis not present

## 2021-06-13 DIAGNOSIS — D509 Iron deficiency anemia, unspecified: Secondary | ICD-10-CM | POA: Diagnosis not present

## 2021-06-13 DIAGNOSIS — N186 End stage renal disease: Secondary | ICD-10-CM | POA: Diagnosis not present

## 2021-06-13 DIAGNOSIS — Z992 Dependence on renal dialysis: Secondary | ICD-10-CM | POA: Diagnosis not present

## 2021-06-13 DIAGNOSIS — N2581 Secondary hyperparathyroidism of renal origin: Secondary | ICD-10-CM | POA: Diagnosis not present

## 2021-06-15 DIAGNOSIS — D509 Iron deficiency anemia, unspecified: Secondary | ICD-10-CM | POA: Diagnosis not present

## 2021-06-15 DIAGNOSIS — N186 End stage renal disease: Secondary | ICD-10-CM | POA: Diagnosis not present

## 2021-06-15 DIAGNOSIS — N2581 Secondary hyperparathyroidism of renal origin: Secondary | ICD-10-CM | POA: Diagnosis not present

## 2021-06-15 DIAGNOSIS — Z992 Dependence on renal dialysis: Secondary | ICD-10-CM | POA: Diagnosis not present

## 2021-06-18 DIAGNOSIS — N2581 Secondary hyperparathyroidism of renal origin: Secondary | ICD-10-CM | POA: Diagnosis not present

## 2021-06-18 DIAGNOSIS — Z992 Dependence on renal dialysis: Secondary | ICD-10-CM | POA: Diagnosis not present

## 2021-06-18 DIAGNOSIS — N186 End stage renal disease: Secondary | ICD-10-CM | POA: Diagnosis not present

## 2021-06-18 DIAGNOSIS — D509 Iron deficiency anemia, unspecified: Secondary | ICD-10-CM | POA: Diagnosis not present

## 2021-06-20 DIAGNOSIS — Z992 Dependence on renal dialysis: Secondary | ICD-10-CM | POA: Diagnosis not present

## 2021-06-20 DIAGNOSIS — D509 Iron deficiency anemia, unspecified: Secondary | ICD-10-CM | POA: Diagnosis not present

## 2021-06-20 DIAGNOSIS — N186 End stage renal disease: Secondary | ICD-10-CM | POA: Diagnosis not present

## 2021-06-20 DIAGNOSIS — N2581 Secondary hyperparathyroidism of renal origin: Secondary | ICD-10-CM | POA: Diagnosis not present

## 2021-06-22 DIAGNOSIS — N186 End stage renal disease: Secondary | ICD-10-CM | POA: Diagnosis not present

## 2021-06-22 DIAGNOSIS — Z992 Dependence on renal dialysis: Secondary | ICD-10-CM | POA: Diagnosis not present

## 2021-06-22 DIAGNOSIS — N2581 Secondary hyperparathyroidism of renal origin: Secondary | ICD-10-CM | POA: Diagnosis not present

## 2021-06-22 DIAGNOSIS — D509 Iron deficiency anemia, unspecified: Secondary | ICD-10-CM | POA: Diagnosis not present

## 2021-06-25 DIAGNOSIS — Z992 Dependence on renal dialysis: Secondary | ICD-10-CM | POA: Diagnosis not present

## 2021-06-25 DIAGNOSIS — D509 Iron deficiency anemia, unspecified: Secondary | ICD-10-CM | POA: Diagnosis not present

## 2021-06-25 DIAGNOSIS — N186 End stage renal disease: Secondary | ICD-10-CM | POA: Diagnosis not present

## 2021-06-25 DIAGNOSIS — N2581 Secondary hyperparathyroidism of renal origin: Secondary | ICD-10-CM | POA: Diagnosis not present

## 2021-06-27 DIAGNOSIS — Z992 Dependence on renal dialysis: Secondary | ICD-10-CM | POA: Diagnosis not present

## 2021-06-27 DIAGNOSIS — D509 Iron deficiency anemia, unspecified: Secondary | ICD-10-CM | POA: Diagnosis not present

## 2021-06-27 DIAGNOSIS — N2581 Secondary hyperparathyroidism of renal origin: Secondary | ICD-10-CM | POA: Diagnosis not present

## 2021-06-27 DIAGNOSIS — N186 End stage renal disease: Secondary | ICD-10-CM | POA: Diagnosis not present

## 2021-06-28 DIAGNOSIS — H903 Sensorineural hearing loss, bilateral: Secondary | ICD-10-CM | POA: Diagnosis not present

## 2021-06-29 DIAGNOSIS — D509 Iron deficiency anemia, unspecified: Secondary | ICD-10-CM | POA: Diagnosis not present

## 2021-06-29 DIAGNOSIS — N2581 Secondary hyperparathyroidism of renal origin: Secondary | ICD-10-CM | POA: Diagnosis not present

## 2021-06-29 DIAGNOSIS — N186 End stage renal disease: Secondary | ICD-10-CM | POA: Diagnosis not present

## 2021-06-29 DIAGNOSIS — Z992 Dependence on renal dialysis: Secondary | ICD-10-CM | POA: Diagnosis not present

## 2021-07-02 DIAGNOSIS — D509 Iron deficiency anemia, unspecified: Secondary | ICD-10-CM | POA: Diagnosis not present

## 2021-07-02 DIAGNOSIS — N2581 Secondary hyperparathyroidism of renal origin: Secondary | ICD-10-CM | POA: Diagnosis not present

## 2021-07-02 DIAGNOSIS — N185 Chronic kidney disease, stage 5: Secondary | ICD-10-CM | POA: Diagnosis not present

## 2021-07-02 DIAGNOSIS — I5031 Acute diastolic (congestive) heart failure: Secondary | ICD-10-CM | POA: Diagnosis not present

## 2021-07-02 DIAGNOSIS — E785 Hyperlipidemia, unspecified: Secondary | ICD-10-CM | POA: Diagnosis not present

## 2021-07-02 DIAGNOSIS — N186 End stage renal disease: Secondary | ICD-10-CM | POA: Diagnosis not present

## 2021-07-02 DIAGNOSIS — Z992 Dependence on renal dialysis: Secondary | ICD-10-CM | POA: Diagnosis not present

## 2021-07-02 DIAGNOSIS — I10 Essential (primary) hypertension: Secondary | ICD-10-CM | POA: Diagnosis not present

## 2021-07-03 DIAGNOSIS — I129 Hypertensive chronic kidney disease with stage 1 through stage 4 chronic kidney disease, or unspecified chronic kidney disease: Secondary | ICD-10-CM | POA: Diagnosis not present

## 2021-07-03 DIAGNOSIS — N186 End stage renal disease: Secondary | ICD-10-CM | POA: Diagnosis not present

## 2021-07-03 DIAGNOSIS — Z992 Dependence on renal dialysis: Secondary | ICD-10-CM | POA: Diagnosis not present

## 2021-07-04 DIAGNOSIS — N2581 Secondary hyperparathyroidism of renal origin: Secondary | ICD-10-CM | POA: Diagnosis not present

## 2021-07-04 DIAGNOSIS — N186 End stage renal disease: Secondary | ICD-10-CM | POA: Diagnosis not present

## 2021-07-04 DIAGNOSIS — Z992 Dependence on renal dialysis: Secondary | ICD-10-CM | POA: Diagnosis not present

## 2021-07-04 DIAGNOSIS — D509 Iron deficiency anemia, unspecified: Secondary | ICD-10-CM | POA: Diagnosis not present

## 2021-07-06 DIAGNOSIS — Z992 Dependence on renal dialysis: Secondary | ICD-10-CM | POA: Diagnosis not present

## 2021-07-06 DIAGNOSIS — N2581 Secondary hyperparathyroidism of renal origin: Secondary | ICD-10-CM | POA: Diagnosis not present

## 2021-07-06 DIAGNOSIS — N186 End stage renal disease: Secondary | ICD-10-CM | POA: Diagnosis not present

## 2021-07-06 DIAGNOSIS — D509 Iron deficiency anemia, unspecified: Secondary | ICD-10-CM | POA: Diagnosis not present

## 2021-07-09 DIAGNOSIS — N186 End stage renal disease: Secondary | ICD-10-CM | POA: Diagnosis not present

## 2021-07-09 DIAGNOSIS — N2581 Secondary hyperparathyroidism of renal origin: Secondary | ICD-10-CM | POA: Diagnosis not present

## 2021-07-09 DIAGNOSIS — Z992 Dependence on renal dialysis: Secondary | ICD-10-CM | POA: Diagnosis not present

## 2021-07-09 DIAGNOSIS — D509 Iron deficiency anemia, unspecified: Secondary | ICD-10-CM | POA: Diagnosis not present

## 2021-07-11 DIAGNOSIS — Z992 Dependence on renal dialysis: Secondary | ICD-10-CM | POA: Diagnosis not present

## 2021-07-11 DIAGNOSIS — N186 End stage renal disease: Secondary | ICD-10-CM | POA: Diagnosis not present

## 2021-07-11 DIAGNOSIS — N2581 Secondary hyperparathyroidism of renal origin: Secondary | ICD-10-CM | POA: Diagnosis not present

## 2021-07-11 DIAGNOSIS — D509 Iron deficiency anemia, unspecified: Secondary | ICD-10-CM | POA: Diagnosis not present

## 2021-07-13 DIAGNOSIS — Z992 Dependence on renal dialysis: Secondary | ICD-10-CM | POA: Diagnosis not present

## 2021-07-13 DIAGNOSIS — N2581 Secondary hyperparathyroidism of renal origin: Secondary | ICD-10-CM | POA: Diagnosis not present

## 2021-07-13 DIAGNOSIS — N186 End stage renal disease: Secondary | ICD-10-CM | POA: Diagnosis not present

## 2021-07-13 DIAGNOSIS — D509 Iron deficiency anemia, unspecified: Secondary | ICD-10-CM | POA: Diagnosis not present

## 2021-07-16 DIAGNOSIS — N186 End stage renal disease: Secondary | ICD-10-CM | POA: Diagnosis not present

## 2021-07-16 DIAGNOSIS — D509 Iron deficiency anemia, unspecified: Secondary | ICD-10-CM | POA: Diagnosis not present

## 2021-07-16 DIAGNOSIS — N2581 Secondary hyperparathyroidism of renal origin: Secondary | ICD-10-CM | POA: Diagnosis not present

## 2021-07-16 DIAGNOSIS — Z992 Dependence on renal dialysis: Secondary | ICD-10-CM | POA: Diagnosis not present

## 2021-07-18 DIAGNOSIS — N186 End stage renal disease: Secondary | ICD-10-CM | POA: Diagnosis not present

## 2021-07-18 DIAGNOSIS — D509 Iron deficiency anemia, unspecified: Secondary | ICD-10-CM | POA: Diagnosis not present

## 2021-07-18 DIAGNOSIS — N2581 Secondary hyperparathyroidism of renal origin: Secondary | ICD-10-CM | POA: Diagnosis not present

## 2021-07-18 DIAGNOSIS — Z992 Dependence on renal dialysis: Secondary | ICD-10-CM | POA: Diagnosis not present

## 2021-07-20 DIAGNOSIS — Z992 Dependence on renal dialysis: Secondary | ICD-10-CM | POA: Diagnosis not present

## 2021-07-20 DIAGNOSIS — N186 End stage renal disease: Secondary | ICD-10-CM | POA: Diagnosis not present

## 2021-07-20 DIAGNOSIS — D509 Iron deficiency anemia, unspecified: Secondary | ICD-10-CM | POA: Diagnosis not present

## 2021-07-20 DIAGNOSIS — N2581 Secondary hyperparathyroidism of renal origin: Secondary | ICD-10-CM | POA: Diagnosis not present

## 2021-07-23 DIAGNOSIS — D509 Iron deficiency anemia, unspecified: Secondary | ICD-10-CM | POA: Diagnosis not present

## 2021-07-23 DIAGNOSIS — N186 End stage renal disease: Secondary | ICD-10-CM | POA: Diagnosis not present

## 2021-07-23 DIAGNOSIS — N2581 Secondary hyperparathyroidism of renal origin: Secondary | ICD-10-CM | POA: Diagnosis not present

## 2021-07-23 DIAGNOSIS — Z992 Dependence on renal dialysis: Secondary | ICD-10-CM | POA: Diagnosis not present

## 2021-07-25 DIAGNOSIS — N2581 Secondary hyperparathyroidism of renal origin: Secondary | ICD-10-CM | POA: Diagnosis not present

## 2021-07-25 DIAGNOSIS — Z992 Dependence on renal dialysis: Secondary | ICD-10-CM | POA: Diagnosis not present

## 2021-07-25 DIAGNOSIS — N186 End stage renal disease: Secondary | ICD-10-CM | POA: Diagnosis not present

## 2021-07-25 DIAGNOSIS — D509 Iron deficiency anemia, unspecified: Secondary | ICD-10-CM | POA: Diagnosis not present

## 2021-07-27 DIAGNOSIS — D509 Iron deficiency anemia, unspecified: Secondary | ICD-10-CM | POA: Diagnosis not present

## 2021-07-27 DIAGNOSIS — Z992 Dependence on renal dialysis: Secondary | ICD-10-CM | POA: Diagnosis not present

## 2021-07-27 DIAGNOSIS — N2581 Secondary hyperparathyroidism of renal origin: Secondary | ICD-10-CM | POA: Diagnosis not present

## 2021-07-27 DIAGNOSIS — N186 End stage renal disease: Secondary | ICD-10-CM | POA: Diagnosis not present

## 2021-07-30 DIAGNOSIS — D509 Iron deficiency anemia, unspecified: Secondary | ICD-10-CM | POA: Diagnosis not present

## 2021-07-30 DIAGNOSIS — N2581 Secondary hyperparathyroidism of renal origin: Secondary | ICD-10-CM | POA: Diagnosis not present

## 2021-07-30 DIAGNOSIS — Z992 Dependence on renal dialysis: Secondary | ICD-10-CM | POA: Diagnosis not present

## 2021-07-30 DIAGNOSIS — N186 End stage renal disease: Secondary | ICD-10-CM | POA: Diagnosis not present

## 2021-08-01 DIAGNOSIS — N186 End stage renal disease: Secondary | ICD-10-CM | POA: Diagnosis not present

## 2021-08-01 DIAGNOSIS — Z992 Dependence on renal dialysis: Secondary | ICD-10-CM | POA: Diagnosis not present

## 2021-08-01 DIAGNOSIS — N2581 Secondary hyperparathyroidism of renal origin: Secondary | ICD-10-CM | POA: Diagnosis not present

## 2021-08-01 DIAGNOSIS — D509 Iron deficiency anemia, unspecified: Secondary | ICD-10-CM | POA: Diagnosis not present

## 2021-08-02 DIAGNOSIS — I10 Essential (primary) hypertension: Secondary | ICD-10-CM | POA: Diagnosis not present

## 2021-08-02 DIAGNOSIS — N185 Chronic kidney disease, stage 5: Secondary | ICD-10-CM | POA: Diagnosis not present

## 2021-08-02 DIAGNOSIS — E785 Hyperlipidemia, unspecified: Secondary | ICD-10-CM | POA: Diagnosis not present

## 2021-08-02 DIAGNOSIS — I5031 Acute diastolic (congestive) heart failure: Secondary | ICD-10-CM | POA: Diagnosis not present

## 2021-08-03 DIAGNOSIS — N2581 Secondary hyperparathyroidism of renal origin: Secondary | ICD-10-CM | POA: Diagnosis not present

## 2021-08-03 DIAGNOSIS — N186 End stage renal disease: Secondary | ICD-10-CM | POA: Diagnosis not present

## 2021-08-03 DIAGNOSIS — Z992 Dependence on renal dialysis: Secondary | ICD-10-CM | POA: Diagnosis not present

## 2021-08-03 DIAGNOSIS — I129 Hypertensive chronic kidney disease with stage 1 through stage 4 chronic kidney disease, or unspecified chronic kidney disease: Secondary | ICD-10-CM | POA: Diagnosis not present

## 2021-08-06 DIAGNOSIS — N2581 Secondary hyperparathyroidism of renal origin: Secondary | ICD-10-CM | POA: Diagnosis not present

## 2021-08-06 DIAGNOSIS — N186 End stage renal disease: Secondary | ICD-10-CM | POA: Diagnosis not present

## 2021-08-06 DIAGNOSIS — Z992 Dependence on renal dialysis: Secondary | ICD-10-CM | POA: Diagnosis not present

## 2021-08-08 DIAGNOSIS — N2581 Secondary hyperparathyroidism of renal origin: Secondary | ICD-10-CM | POA: Diagnosis not present

## 2021-08-08 DIAGNOSIS — N186 End stage renal disease: Secondary | ICD-10-CM | POA: Diagnosis not present

## 2021-08-08 DIAGNOSIS — Z992 Dependence on renal dialysis: Secondary | ICD-10-CM | POA: Diagnosis not present

## 2021-08-10 DIAGNOSIS — N2581 Secondary hyperparathyroidism of renal origin: Secondary | ICD-10-CM | POA: Diagnosis not present

## 2021-08-10 DIAGNOSIS — Z992 Dependence on renal dialysis: Secondary | ICD-10-CM | POA: Diagnosis not present

## 2021-08-10 DIAGNOSIS — N186 End stage renal disease: Secondary | ICD-10-CM | POA: Diagnosis not present

## 2021-08-13 DIAGNOSIS — Z992 Dependence on renal dialysis: Secondary | ICD-10-CM | POA: Diagnosis not present

## 2021-08-13 DIAGNOSIS — N2581 Secondary hyperparathyroidism of renal origin: Secondary | ICD-10-CM | POA: Diagnosis not present

## 2021-08-13 DIAGNOSIS — N186 End stage renal disease: Secondary | ICD-10-CM | POA: Diagnosis not present

## 2021-08-15 DIAGNOSIS — N186 End stage renal disease: Secondary | ICD-10-CM | POA: Diagnosis not present

## 2021-08-15 DIAGNOSIS — Z992 Dependence on renal dialysis: Secondary | ICD-10-CM | POA: Diagnosis not present

## 2021-08-15 DIAGNOSIS — N2581 Secondary hyperparathyroidism of renal origin: Secondary | ICD-10-CM | POA: Diagnosis not present

## 2021-08-17 DIAGNOSIS — N186 End stage renal disease: Secondary | ICD-10-CM | POA: Diagnosis not present

## 2021-08-17 DIAGNOSIS — Z992 Dependence on renal dialysis: Secondary | ICD-10-CM | POA: Diagnosis not present

## 2021-08-17 DIAGNOSIS — N2581 Secondary hyperparathyroidism of renal origin: Secondary | ICD-10-CM | POA: Diagnosis not present

## 2021-08-20 DIAGNOSIS — N186 End stage renal disease: Secondary | ICD-10-CM | POA: Diagnosis not present

## 2021-08-20 DIAGNOSIS — N2581 Secondary hyperparathyroidism of renal origin: Secondary | ICD-10-CM | POA: Diagnosis not present

## 2021-08-20 DIAGNOSIS — Z992 Dependence on renal dialysis: Secondary | ICD-10-CM | POA: Diagnosis not present

## 2021-08-22 DIAGNOSIS — Z992 Dependence on renal dialysis: Secondary | ICD-10-CM | POA: Diagnosis not present

## 2021-08-22 DIAGNOSIS — N2581 Secondary hyperparathyroidism of renal origin: Secondary | ICD-10-CM | POA: Diagnosis not present

## 2021-08-22 DIAGNOSIS — N186 End stage renal disease: Secondary | ICD-10-CM | POA: Diagnosis not present

## 2021-08-24 DIAGNOSIS — Z992 Dependence on renal dialysis: Secondary | ICD-10-CM | POA: Diagnosis not present

## 2021-08-24 DIAGNOSIS — N186 End stage renal disease: Secondary | ICD-10-CM | POA: Diagnosis not present

## 2021-08-24 DIAGNOSIS — N2581 Secondary hyperparathyroidism of renal origin: Secondary | ICD-10-CM | POA: Diagnosis not present

## 2021-08-27 DIAGNOSIS — N2581 Secondary hyperparathyroidism of renal origin: Secondary | ICD-10-CM | POA: Diagnosis not present

## 2021-08-27 DIAGNOSIS — N186 End stage renal disease: Secondary | ICD-10-CM | POA: Diagnosis not present

## 2021-08-27 DIAGNOSIS — Z992 Dependence on renal dialysis: Secondary | ICD-10-CM | POA: Diagnosis not present

## 2021-08-29 DIAGNOSIS — N186 End stage renal disease: Secondary | ICD-10-CM | POA: Diagnosis not present

## 2021-08-29 DIAGNOSIS — N2581 Secondary hyperparathyroidism of renal origin: Secondary | ICD-10-CM | POA: Diagnosis not present

## 2021-08-29 DIAGNOSIS — Z992 Dependence on renal dialysis: Secondary | ICD-10-CM | POA: Diagnosis not present

## 2021-08-31 DIAGNOSIS — Z992 Dependence on renal dialysis: Secondary | ICD-10-CM | POA: Diagnosis not present

## 2021-08-31 DIAGNOSIS — N186 End stage renal disease: Secondary | ICD-10-CM | POA: Diagnosis not present

## 2021-08-31 DIAGNOSIS — N2581 Secondary hyperparathyroidism of renal origin: Secondary | ICD-10-CM | POA: Diagnosis not present

## 2021-09-02 DIAGNOSIS — I129 Hypertensive chronic kidney disease with stage 1 through stage 4 chronic kidney disease, or unspecified chronic kidney disease: Secondary | ICD-10-CM | POA: Diagnosis not present

## 2021-09-02 DIAGNOSIS — Z992 Dependence on renal dialysis: Secondary | ICD-10-CM | POA: Diagnosis not present

## 2021-09-02 DIAGNOSIS — N186 End stage renal disease: Secondary | ICD-10-CM | POA: Diagnosis not present

## 2021-09-03 DIAGNOSIS — N2581 Secondary hyperparathyroidism of renal origin: Secondary | ICD-10-CM | POA: Diagnosis not present

## 2021-09-03 DIAGNOSIS — N186 End stage renal disease: Secondary | ICD-10-CM | POA: Diagnosis not present

## 2021-09-03 DIAGNOSIS — Z992 Dependence on renal dialysis: Secondary | ICD-10-CM | POA: Diagnosis not present

## 2021-09-03 DIAGNOSIS — D631 Anemia in chronic kidney disease: Secondary | ICD-10-CM | POA: Diagnosis not present

## 2021-09-05 DIAGNOSIS — N2581 Secondary hyperparathyroidism of renal origin: Secondary | ICD-10-CM | POA: Diagnosis not present

## 2021-09-05 DIAGNOSIS — D631 Anemia in chronic kidney disease: Secondary | ICD-10-CM | POA: Diagnosis not present

## 2021-09-05 DIAGNOSIS — Z992 Dependence on renal dialysis: Secondary | ICD-10-CM | POA: Diagnosis not present

## 2021-09-05 DIAGNOSIS — N186 End stage renal disease: Secondary | ICD-10-CM | POA: Diagnosis not present

## 2021-09-07 DIAGNOSIS — Z992 Dependence on renal dialysis: Secondary | ICD-10-CM | POA: Diagnosis not present

## 2021-09-07 DIAGNOSIS — N2581 Secondary hyperparathyroidism of renal origin: Secondary | ICD-10-CM | POA: Diagnosis not present

## 2021-09-07 DIAGNOSIS — D631 Anemia in chronic kidney disease: Secondary | ICD-10-CM | POA: Diagnosis not present

## 2021-09-07 DIAGNOSIS — N186 End stage renal disease: Secondary | ICD-10-CM | POA: Diagnosis not present

## 2021-09-10 DIAGNOSIS — D631 Anemia in chronic kidney disease: Secondary | ICD-10-CM | POA: Diagnosis not present

## 2021-09-10 DIAGNOSIS — N2581 Secondary hyperparathyroidism of renal origin: Secondary | ICD-10-CM | POA: Diagnosis not present

## 2021-09-10 DIAGNOSIS — Z992 Dependence on renal dialysis: Secondary | ICD-10-CM | POA: Diagnosis not present

## 2021-09-10 DIAGNOSIS — N186 End stage renal disease: Secondary | ICD-10-CM | POA: Diagnosis not present

## 2021-09-12 DIAGNOSIS — N2581 Secondary hyperparathyroidism of renal origin: Secondary | ICD-10-CM | POA: Diagnosis not present

## 2021-09-12 DIAGNOSIS — Z992 Dependence on renal dialysis: Secondary | ICD-10-CM | POA: Diagnosis not present

## 2021-09-12 DIAGNOSIS — D631 Anemia in chronic kidney disease: Secondary | ICD-10-CM | POA: Diagnosis not present

## 2021-09-12 DIAGNOSIS — N186 End stage renal disease: Secondary | ICD-10-CM | POA: Diagnosis not present

## 2021-09-14 DIAGNOSIS — N186 End stage renal disease: Secondary | ICD-10-CM | POA: Diagnosis not present

## 2021-09-14 DIAGNOSIS — N2581 Secondary hyperparathyroidism of renal origin: Secondary | ICD-10-CM | POA: Diagnosis not present

## 2021-09-14 DIAGNOSIS — D631 Anemia in chronic kidney disease: Secondary | ICD-10-CM | POA: Diagnosis not present

## 2021-09-14 DIAGNOSIS — Z992 Dependence on renal dialysis: Secondary | ICD-10-CM | POA: Diagnosis not present

## 2021-09-17 DIAGNOSIS — D631 Anemia in chronic kidney disease: Secondary | ICD-10-CM | POA: Diagnosis not present

## 2021-09-17 DIAGNOSIS — Z992 Dependence on renal dialysis: Secondary | ICD-10-CM | POA: Diagnosis not present

## 2021-09-17 DIAGNOSIS — N186 End stage renal disease: Secondary | ICD-10-CM | POA: Diagnosis not present

## 2021-09-17 DIAGNOSIS — N2581 Secondary hyperparathyroidism of renal origin: Secondary | ICD-10-CM | POA: Diagnosis not present

## 2021-09-19 DIAGNOSIS — N2581 Secondary hyperparathyroidism of renal origin: Secondary | ICD-10-CM | POA: Diagnosis not present

## 2021-09-19 DIAGNOSIS — D631 Anemia in chronic kidney disease: Secondary | ICD-10-CM | POA: Diagnosis not present

## 2021-09-19 DIAGNOSIS — Z992 Dependence on renal dialysis: Secondary | ICD-10-CM | POA: Diagnosis not present

## 2021-09-19 DIAGNOSIS — N186 End stage renal disease: Secondary | ICD-10-CM | POA: Diagnosis not present

## 2021-09-21 DIAGNOSIS — D631 Anemia in chronic kidney disease: Secondary | ICD-10-CM | POA: Diagnosis not present

## 2021-09-21 DIAGNOSIS — N186 End stage renal disease: Secondary | ICD-10-CM | POA: Diagnosis not present

## 2021-09-21 DIAGNOSIS — Z992 Dependence on renal dialysis: Secondary | ICD-10-CM | POA: Diagnosis not present

## 2021-09-21 DIAGNOSIS — N2581 Secondary hyperparathyroidism of renal origin: Secondary | ICD-10-CM | POA: Diagnosis not present

## 2021-09-24 DIAGNOSIS — N186 End stage renal disease: Secondary | ICD-10-CM | POA: Diagnosis not present

## 2021-09-24 DIAGNOSIS — Z992 Dependence on renal dialysis: Secondary | ICD-10-CM | POA: Diagnosis not present

## 2021-09-24 DIAGNOSIS — N2581 Secondary hyperparathyroidism of renal origin: Secondary | ICD-10-CM | POA: Diagnosis not present

## 2021-09-24 DIAGNOSIS — D631 Anemia in chronic kidney disease: Secondary | ICD-10-CM | POA: Diagnosis not present

## 2021-09-26 DIAGNOSIS — N186 End stage renal disease: Secondary | ICD-10-CM | POA: Diagnosis not present

## 2021-09-26 DIAGNOSIS — N2581 Secondary hyperparathyroidism of renal origin: Secondary | ICD-10-CM | POA: Diagnosis not present

## 2021-09-26 DIAGNOSIS — D631 Anemia in chronic kidney disease: Secondary | ICD-10-CM | POA: Diagnosis not present

## 2021-09-26 DIAGNOSIS — Z992 Dependence on renal dialysis: Secondary | ICD-10-CM | POA: Diagnosis not present

## 2021-09-28 DIAGNOSIS — D631 Anemia in chronic kidney disease: Secondary | ICD-10-CM | POA: Diagnosis not present

## 2021-09-28 DIAGNOSIS — N2581 Secondary hyperparathyroidism of renal origin: Secondary | ICD-10-CM | POA: Diagnosis not present

## 2021-09-28 DIAGNOSIS — Z992 Dependence on renal dialysis: Secondary | ICD-10-CM | POA: Diagnosis not present

## 2021-09-28 DIAGNOSIS — N186 End stage renal disease: Secondary | ICD-10-CM | POA: Diagnosis not present

## 2021-10-01 DIAGNOSIS — N2581 Secondary hyperparathyroidism of renal origin: Secondary | ICD-10-CM | POA: Diagnosis not present

## 2021-10-01 DIAGNOSIS — N186 End stage renal disease: Secondary | ICD-10-CM | POA: Diagnosis not present

## 2021-10-01 DIAGNOSIS — D631 Anemia in chronic kidney disease: Secondary | ICD-10-CM | POA: Diagnosis not present

## 2021-10-01 DIAGNOSIS — Z992 Dependence on renal dialysis: Secondary | ICD-10-CM | POA: Diagnosis not present

## 2021-10-03 DIAGNOSIS — Z992 Dependence on renal dialysis: Secondary | ICD-10-CM | POA: Diagnosis not present

## 2021-10-03 DIAGNOSIS — N2581 Secondary hyperparathyroidism of renal origin: Secondary | ICD-10-CM | POA: Diagnosis not present

## 2021-10-03 DIAGNOSIS — I129 Hypertensive chronic kidney disease with stage 1 through stage 4 chronic kidney disease, or unspecified chronic kidney disease: Secondary | ICD-10-CM | POA: Diagnosis not present

## 2021-10-03 DIAGNOSIS — N186 End stage renal disease: Secondary | ICD-10-CM | POA: Diagnosis not present

## 2021-10-03 DIAGNOSIS — D631 Anemia in chronic kidney disease: Secondary | ICD-10-CM | POA: Diagnosis not present

## 2021-10-05 DIAGNOSIS — D631 Anemia in chronic kidney disease: Secondary | ICD-10-CM | POA: Diagnosis not present

## 2021-10-05 DIAGNOSIS — N186 End stage renal disease: Secondary | ICD-10-CM | POA: Diagnosis not present

## 2021-10-05 DIAGNOSIS — Z992 Dependence on renal dialysis: Secondary | ICD-10-CM | POA: Diagnosis not present

## 2021-10-05 DIAGNOSIS — N2581 Secondary hyperparathyroidism of renal origin: Secondary | ICD-10-CM | POA: Diagnosis not present

## 2021-10-08 DIAGNOSIS — N186 End stage renal disease: Secondary | ICD-10-CM | POA: Diagnosis not present

## 2021-10-08 DIAGNOSIS — D631 Anemia in chronic kidney disease: Secondary | ICD-10-CM | POA: Diagnosis not present

## 2021-10-08 DIAGNOSIS — N2581 Secondary hyperparathyroidism of renal origin: Secondary | ICD-10-CM | POA: Diagnosis not present

## 2021-10-08 DIAGNOSIS — Z992 Dependence on renal dialysis: Secondary | ICD-10-CM | POA: Diagnosis not present

## 2021-10-09 ENCOUNTER — Other Ambulatory Visit: Payer: Self-pay | Admitting: *Deleted

## 2021-10-09 NOTE — Patient Outreach (Signed)
[facility]) Care Management Telephonic RN Care Manager Note   10/09/2021 Name:  Yvonne Powell MRN:  [id number] DOB:  05-29-1928  Summary: Initial outreach with some noted telephone connection issues reported  Able to schedule pt for initial assessment for 10/14/21  She confirms she has Hemodialysis (HD), on Tuesdays Thursdays and saturdays and is generally too tired in the evenings to interact  Unsuccessful outreach letter mailed   Subjective: Yvonne Powell is an 88 y.o. year old female who is a primary patient of Velna Hatchet, MD. The care management team was consulted for assistance with care management and/or care coordination needs.    Telephonic RN Care Manager completed Telephone Visit today.   Yvonne Powell was referred for the [facility] reach member initiative program on 10/01/21   Objective:  Medications Reviewed Today     Reviewed by Skeet Latch, MD (Physician) on 03/06/21 at 1016  Med List Status: <None>   Medication Order Taking? Sig Documenting Provider Last Dose Status Informant  acetaminophen (TYLENOL) 325 MG tablet [id number] Yes Take 325 mg by mouth every 6 (six) hours as needed for mild pain (or headaches). [provider] Taking Active Self  calcium acetate (PHOSLO) 667 MG capsule [id number] Yes Take 1,334 mg by mouth 3 (three) times daily with meals. [provider] Taking Active Self  Iron-FA-DSS-B Cmplx-Vit C (NEPHRON FA) TABS [id number] Yes Take 1 tablet by mouth daily before supper. [provider] Taking Active Self  levothyroxine (SYNTHROID, LEVOTHROID) 88 MCG tablet [id number] Yes Take 88 mcg by mouth daily before breakfast. [provider] Taking Active Self  lidocaine-prilocaine (EMLA) cream [id number] Yes Apply 1 application topically Every Tuesday,Thursday,and Saturday with dialysis. [provider] Taking Active Self  Med List Note Gerlene Burdock 09/20/20 1619): Dialysis days are currently  Tues/Thurs/Sat at [place], [place],  [address] [phone]             SDOH:  (Social Determinants of Health) assessments and interventions performed:    Care Plan  Review of patient past medical history, allergies, medications, health status, including review of consultants reports, laboratory and other test data, was performed as part of comprehensive evaluation for care management services.   There are no care plans that you recently modified to display for this patient.    Plan: The patient has been provided with contact information for the care management team and has been advised to call with any health related questions or concerns.  The care management team will reach out to the patient again over the next 30+ business days.  Abriella Filkins L. Lavina Hamman, RN, BSN, [place] Coordinator Office number [phone] [facility] number [phone] Fax number [phone]

## 2021-10-10 DIAGNOSIS — Z992 Dependence on renal dialysis: Secondary | ICD-10-CM | POA: Diagnosis not present

## 2021-10-10 DIAGNOSIS — N186 End stage renal disease: Secondary | ICD-10-CM | POA: Diagnosis not present

## 2021-10-10 DIAGNOSIS — D631 Anemia in chronic kidney disease: Secondary | ICD-10-CM | POA: Diagnosis not present

## 2021-10-10 DIAGNOSIS — N2581 Secondary hyperparathyroidism of renal origin: Secondary | ICD-10-CM | POA: Diagnosis not present

## 2021-10-12 DIAGNOSIS — N186 End stage renal disease: Secondary | ICD-10-CM | POA: Diagnosis not present

## 2021-10-12 DIAGNOSIS — N2581 Secondary hyperparathyroidism of renal origin: Secondary | ICD-10-CM | POA: Diagnosis not present

## 2021-10-12 DIAGNOSIS — D631 Anemia in chronic kidney disease: Secondary | ICD-10-CM | POA: Diagnosis not present

## 2021-10-12 DIAGNOSIS — Z992 Dependence on renal dialysis: Secondary | ICD-10-CM | POA: Diagnosis not present

## 2021-10-14 ENCOUNTER — Other Ambulatory Visit: Payer: Self-pay | Admitting: *Deleted

## 2021-10-14 NOTE — Patient Outreach (Signed)
[facility]) Care Management Telephonic RN Care Manager Note   10/14/2021 Name:  Yvonne Powell MRN:  [id number] DOB:  1928/06/02  Summary: Successful initial outreach   Yvonne Powell states she is doing well  She is a hemodialysis patient who reports no issues with walking, cleaning her home independently Her son [name] lives with her in their apartment, and he cooks, provides transportation, shops She does not go out much after having covid and pneumonia  She reports seeing her Pcp yearly only She does not prefer the flu vaccine after having a bad reaction that she reports made her throat close She is now nervous about receiving any    She is able to discuss her medicines with RN CM. She denies care coordination needs related to medicines. She reports a resolved reaction to calcium  She reports not receiving a mammogram, bone density test  See urology Dr Tammi Klippel for management of the cyst on her  left kidney    Disease management (CKD, CHF, HTN)  Reports she has been receiving HD for 2 years and on her birthday, she found out about her dx of CHF after a gall bladder surgery Socializes at HD  She reports she never has swelling She has a lot of fatigue after first but reports understanding this may have been related to the need for HD to "fix my dry weight"  Weight has remained low, and she takes in boost with 1/2 apple in it to prevent an upset stomach All life had elevated blood pressures and anxiety She reports now she has values from 125-150 She reports being allergic to shellfish but prefers to eat lots of vegetables, [place], chicken & hamburger She is on a 32 oz fluid restriction a day   Recommendations/Changes made from today's visit: Successful initial outreach Lots of education on chf Discuss action plans  for chf & ckd Discussed her anemia the first day after so fatigue  Discussed hypotension risks, white coat syndrome related  Discussed reasons HTN  elevates- pain, anxiety, genes    Subjective: Yvonne Powell is an 88 y.o. year old female who is a primary patient of Velna Hatchet, MD. The care management team was consulted for assistance with care management and/or care coordination needs.    Telephonic RN Care Manager completed Telephone Visit today.   Objective:  Medications Reviewed Today     Reviewed by Skeet Latch, MD (Physician) on 03/06/21 at 1016  Med List Status: <None>   Medication Order Taking? Sig Documenting Provider Last Dose Status Informant  acetaminophen (TYLENOL) 325 MG tablet [id number] Yes Take 325 mg by mouth every 6 (six) hours as needed for mild pain (or headaches). [provider] Taking Active Self  calcium acetate (PHOSLO) 667 MG capsule [id number] Yes Take 1,334 mg by mouth 3 (three) times daily with meals. [provider] Taking Active Self  Iron-FA-DSS-B Cmplx-Vit C (NEPHRON FA) TABS [id number] Yes Take 1 tablet by mouth daily before supper. [provider] Taking Active Self  levothyroxine (SYNTHROID, LEVOTHROID) 88 MCG tablet [id number] Yes Take 88 mcg by mouth daily before breakfast. [provider] Taking Active Self  lidocaine-prilocaine (EMLA) cream [id number] Yes Apply 1 application topically Every Tuesday,Thursday,and Saturday with dialysis. [provider] Taking Active Self  Med List Note Gerlene Burdock 09/20/20 1619): Dialysis days are currently Tues/Thurs/Sat at [place], [place], [place] [address] [phone]           Patient Active Problem List  Diagnosis Date Noted   Pruritus, unspecified 09/20/2020   COVID-19 08/28/2020   Encounter for immunization 11/12/2019   Hearing loss 10/26/2019   Allergy, unspecified, initial encounter 10/17/2019   Arteriovenous fistula, acquired ([place]) 10/17/2019   Hypertensive chronic kidney disease with stage 1 through stage 4 chronic kidney disease, or unspecified chronic  kidney disease 10/17/2019   Coagulation defect, unspecified ([place]) 10/17/2019   Iron deficiency anemia, unspecified 10/17/2019   Other spondylosis with myelopathy, lumbar region 10/17/2019   Personal history of other diseases of the digestive system 10/17/2019   Personal history of other drug therapy 10/17/2019   Secondary hyperparathyroidism of renal origin ([place]) 10/17/2019   Shortness of breath 10/17/2019   Unilateral primary osteoarthritis, left hip 10/17/2019   Unspecified hemorrhoids 10/17/2019   Chronic diastolic heart failure ([place]) 09/08/2019   Anemia in chronic kidney disease 09/08/2019   Elevated troponin 09/08/2019   Hyperkalemia 09/08/2019   Hypertension    Hypothyroidism    End stage renal disease ([place]) 12/21/2018   Cholelithiasis 12/21/2018   Intractable generalized abdominal pain 12/20/2018     SDOH:  (Social Determinants of Health) assessments and interventions performed:    Care Plan  Review of patient past medical history, allergies, medications, health status, including review of consultants reports, laboratory and other test data, was performed as part of comprehensive evaluation for care management services.   Care Plan : RN Care Manager Plan of Care  Updates made by Barbaraann Faster, RN since 10/14/2021 12:00 AM     Problem: Complex Care Coordination Needs and disease management in patient with   Priority: High  Onset Date: 10/14/2021       Plan: The patient has been provided with contact information for the care management team and has been advised to call with any health related questions or concerns.  The care management team will reach out to the patient again over the next 30+ business days.  Kayl Stogdill L. Lavina Hamman, RN, BSN, [place] Coordinator Office number [phone] [facility] number [phone] Fax number [phone]

## 2021-10-15 DIAGNOSIS — Z992 Dependence on renal dialysis: Secondary | ICD-10-CM | POA: Diagnosis not present

## 2021-10-15 DIAGNOSIS — N2581 Secondary hyperparathyroidism of renal origin: Secondary | ICD-10-CM | POA: Diagnosis not present

## 2021-10-15 DIAGNOSIS — D631 Anemia in chronic kidney disease: Secondary | ICD-10-CM | POA: Diagnosis not present

## 2021-10-15 DIAGNOSIS — N186 End stage renal disease: Secondary | ICD-10-CM | POA: Diagnosis not present

## 2021-10-17 DIAGNOSIS — N2581 Secondary hyperparathyroidism of renal origin: Secondary | ICD-10-CM | POA: Diagnosis not present

## 2021-10-17 DIAGNOSIS — N186 End stage renal disease: Secondary | ICD-10-CM | POA: Diagnosis not present

## 2021-10-17 DIAGNOSIS — D631 Anemia in chronic kidney disease: Secondary | ICD-10-CM | POA: Diagnosis not present

## 2021-10-17 DIAGNOSIS — Z992 Dependence on renal dialysis: Secondary | ICD-10-CM | POA: Diagnosis not present

## 2021-10-19 DIAGNOSIS — Z992 Dependence on renal dialysis: Secondary | ICD-10-CM | POA: Diagnosis not present

## 2021-10-19 DIAGNOSIS — N2581 Secondary hyperparathyroidism of renal origin: Secondary | ICD-10-CM | POA: Diagnosis not present

## 2021-10-19 DIAGNOSIS — D631 Anemia in chronic kidney disease: Secondary | ICD-10-CM | POA: Diagnosis not present

## 2021-10-19 DIAGNOSIS — N186 End stage renal disease: Secondary | ICD-10-CM | POA: Diagnosis not present

## 2021-10-22 DIAGNOSIS — N186 End stage renal disease: Secondary | ICD-10-CM | POA: Diagnosis not present

## 2021-10-22 DIAGNOSIS — Z992 Dependence on renal dialysis: Secondary | ICD-10-CM | POA: Diagnosis not present

## 2021-10-22 DIAGNOSIS — D631 Anemia in chronic kidney disease: Secondary | ICD-10-CM | POA: Diagnosis not present

## 2021-10-22 DIAGNOSIS — N2581 Secondary hyperparathyroidism of renal origin: Secondary | ICD-10-CM | POA: Diagnosis not present

## 2021-10-24 DIAGNOSIS — D631 Anemia in chronic kidney disease: Secondary | ICD-10-CM | POA: Diagnosis not present

## 2021-10-24 DIAGNOSIS — N186 End stage renal disease: Secondary | ICD-10-CM | POA: Diagnosis not present

## 2021-10-24 DIAGNOSIS — Z992 Dependence on renal dialysis: Secondary | ICD-10-CM | POA: Diagnosis not present

## 2021-10-24 DIAGNOSIS — N2581 Secondary hyperparathyroidism of renal origin: Secondary | ICD-10-CM | POA: Diagnosis not present

## 2021-10-25 DIAGNOSIS — I10 Essential (primary) hypertension: Secondary | ICD-10-CM | POA: Diagnosis not present

## 2021-10-25 DIAGNOSIS — D638 Anemia in other chronic diseases classified elsewhere: Secondary | ICD-10-CM | POA: Diagnosis not present

## 2021-10-25 DIAGNOSIS — E785 Hyperlipidemia, unspecified: Secondary | ICD-10-CM | POA: Diagnosis not present

## 2021-10-25 DIAGNOSIS — E039 Hypothyroidism, unspecified: Secondary | ICD-10-CM | POA: Diagnosis not present

## 2021-10-25 DIAGNOSIS — R7989 Other specified abnormal findings of blood chemistry: Secondary | ICD-10-CM | POA: Diagnosis not present

## 2021-10-26 DIAGNOSIS — D631 Anemia in chronic kidney disease: Secondary | ICD-10-CM | POA: Diagnosis not present

## 2021-10-26 DIAGNOSIS — N186 End stage renal disease: Secondary | ICD-10-CM | POA: Diagnosis not present

## 2021-10-26 DIAGNOSIS — Z992 Dependence on renal dialysis: Secondary | ICD-10-CM | POA: Diagnosis not present

## 2021-10-26 DIAGNOSIS — N2581 Secondary hyperparathyroidism of renal origin: Secondary | ICD-10-CM | POA: Diagnosis not present

## 2021-10-29 DIAGNOSIS — D631 Anemia in chronic kidney disease: Secondary | ICD-10-CM | POA: Diagnosis not present

## 2021-10-29 DIAGNOSIS — N2581 Secondary hyperparathyroidism of renal origin: Secondary | ICD-10-CM | POA: Diagnosis not present

## 2021-10-29 DIAGNOSIS — Z992 Dependence on renal dialysis: Secondary | ICD-10-CM | POA: Diagnosis not present

## 2021-10-29 DIAGNOSIS — N186 End stage renal disease: Secondary | ICD-10-CM | POA: Diagnosis not present

## 2021-10-31 DIAGNOSIS — Z992 Dependence on renal dialysis: Secondary | ICD-10-CM | POA: Diagnosis not present

## 2021-10-31 DIAGNOSIS — N2581 Secondary hyperparathyroidism of renal origin: Secondary | ICD-10-CM | POA: Diagnosis not present

## 2021-10-31 DIAGNOSIS — N186 End stage renal disease: Secondary | ICD-10-CM | POA: Diagnosis not present

## 2021-10-31 DIAGNOSIS — D631 Anemia in chronic kidney disease: Secondary | ICD-10-CM | POA: Diagnosis not present

## 2021-11-01 DIAGNOSIS — Z0000 Encounter for general adult medical examination without abnormal findings: Secondary | ICD-10-CM | POA: Diagnosis not present

## 2021-11-01 DIAGNOSIS — Z23 Encounter for immunization: Secondary | ICD-10-CM | POA: Diagnosis not present

## 2021-11-01 DIAGNOSIS — I120 Hypertensive chronic kidney disease with stage 5 chronic kidney disease or end stage renal disease: Secondary | ICD-10-CM | POA: Diagnosis not present

## 2021-11-01 DIAGNOSIS — N2581 Secondary hyperparathyroidism of renal origin: Secondary | ICD-10-CM | POA: Diagnosis not present

## 2021-11-01 DIAGNOSIS — Z1339 Encounter for screening examination for other mental health and behavioral disorders: Secondary | ICD-10-CM | POA: Diagnosis not present

## 2021-11-01 DIAGNOSIS — D638 Anemia in other chronic diseases classified elsewhere: Secondary | ICD-10-CM | POA: Diagnosis not present

## 2021-11-01 DIAGNOSIS — Z992 Dependence on renal dialysis: Secondary | ICD-10-CM | POA: Diagnosis not present

## 2021-11-01 DIAGNOSIS — E039 Hypothyroidism, unspecified: Secondary | ICD-10-CM | POA: Diagnosis not present

## 2021-11-01 DIAGNOSIS — N185 Chronic kidney disease, stage 5: Secondary | ICD-10-CM | POA: Diagnosis not present

## 2021-11-01 DIAGNOSIS — E785 Hyperlipidemia, unspecified: Secondary | ICD-10-CM | POA: Diagnosis not present

## 2021-11-01 DIAGNOSIS — I5032 Chronic diastolic (congestive) heart failure: Secondary | ICD-10-CM | POA: Diagnosis not present

## 2021-11-01 DIAGNOSIS — Z1331 Encounter for screening for depression: Secondary | ICD-10-CM | POA: Diagnosis not present

## 2021-11-02 DIAGNOSIS — Z992 Dependence on renal dialysis: Secondary | ICD-10-CM | POA: Diagnosis not present

## 2021-11-02 DIAGNOSIS — N2581 Secondary hyperparathyroidism of renal origin: Secondary | ICD-10-CM | POA: Diagnosis not present

## 2021-11-02 DIAGNOSIS — N186 End stage renal disease: Secondary | ICD-10-CM | POA: Diagnosis not present

## 2021-11-02 DIAGNOSIS — I129 Hypertensive chronic kidney disease with stage 1 through stage 4 chronic kidney disease, or unspecified chronic kidney disease: Secondary | ICD-10-CM | POA: Diagnosis not present

## 2021-11-05 DIAGNOSIS — N2581 Secondary hyperparathyroidism of renal origin: Secondary | ICD-10-CM | POA: Diagnosis not present

## 2021-11-05 DIAGNOSIS — N186 End stage renal disease: Secondary | ICD-10-CM | POA: Diagnosis not present

## 2021-11-05 DIAGNOSIS — Z992 Dependence on renal dialysis: Secondary | ICD-10-CM | POA: Diagnosis not present

## 2021-11-06 ENCOUNTER — Encounter: Payer: Self-pay | Admitting: *Deleted

## 2021-11-06 ENCOUNTER — Other Ambulatory Visit: Payer: Self-pay | Admitting: *Deleted

## 2021-11-06 NOTE — Patient Outreach (Signed)
[facility]) Care Management Telephonic RN Care Manager Note   11/06/2021 Name:  Berlene Dixson MRN:  [id number] DOB:  July 26, 1928  Summary: Mrs Eichhorn reports she is doing well with this successful outreach  Continues to attend dialysis on Tuesday, Thursdays and Saturday Recently seen by pcp on November 01 2021  Eats small meals at least 3 meals but never have been a "big eater" and take a supplement Maintaining BP Bone density test in December 09 2021 Voices understanding of case closure and possible pending future outreach from another [facility] RN CM   Recommendations/Changes made from today's visit: Follow up outreach for CHF, CKD Discussed case closure with possible pending future outreach from another [facility] RN CM    Subjective: Clemie General is an 88 y.o. year old female who is a primary patient of Velna Hatchet, MD. The care management team was consulted for assistance with care management and/or care coordination needs.    Telephonic RN Care Manager completed Telephone Visit today.   Objective:  Medications Reviewed Today     Reviewed by Barbaraann Faster, RN (Registered Nurse) on 11/06/21 at 10  Med List Status: <None>   Medication Order Taking? Sig Documenting Provider Last Dose Status Informant  acetaminophen (TYLENOL) 325 MG tablet [id number]  Take 325 mg by mouth every 6 (six) hours as needed for mild pain (or headaches). [provider]  Active Self  calcium acetate (PHOSLO) 667 MG capsule [id number]  Take 1,334 mg by mouth 3 (three) times daily with meals. [provider]  Active Self  Iron-FA-DSS-B Cmplx-Vit C (NEPHRON FA) TABS [id number]  Take 1 tablet by mouth daily before supper. [provider]  Active Self  levothyroxine (SYNTHROID, LEVOTHROID) 88 MCG tablet [id number]  Take 88 mcg by mouth daily before breakfast. [provider]  Active Self  lidocaine-prilocaine (EMLA) cream [id number]  Apply 1 application topically Every  Tuesday,Thursday,and Saturday with dialysis. [provider]  Active Self  Methoxy PEG-Epoetin Angus Seller [id number] Yes Mircera [provider]  Active   Med List Note Gerlene Burdock 09/20/20 1619): Dialysis days are currently Tues/Thurs/Sat at [place], [place], [place] [address] [phone]           Patient Active Problem List   Diagnosis Date Noted   Pruritus, unspecified 09/20/2020   COVID-19 08/28/2020   Encounter for immunization 11/12/2019   Hearing loss 10/26/2019   Allergy, unspecified, initial encounter 10/17/2019   Arteriovenous fistula, acquired ([place]) 10/17/2019   Hypertensive chronic kidney disease with stage 1 through stage 4 chronic kidney disease, or unspecified chronic kidney disease 10/17/2019   Coagulation defect, unspecified ([place]) 10/17/2019   Iron deficiency anemia, unspecified 10/17/2019   Other spondylosis with myelopathy, lumbar region 10/17/2019   Personal history of other diseases of the digestive system 10/17/2019   Personal history of other drug therapy 10/17/2019   Secondary hyperparathyroidism of renal origin ([place]) 10/17/2019   Shortness of breath 10/17/2019   Unilateral primary osteoarthritis, left hip 10/17/2019   Unspecified hemorrhoids 10/17/2019   Chronic diastolic heart failure ([place]) 09/08/2019   Anemia in chronic kidney disease 09/08/2019   Elevated troponin 09/08/2019   Hyperkalemia 09/08/2019   Hypertension    Hypothyroidism    End stage renal disease ([place]) 12/21/2018   Cholelithiasis 12/21/2018   Intractable generalized abdominal pain 12/20/2018     SDOH:  (Social Determinants of Health) assessments and interventions performed:  SDOH Interventions    Flowsheet Row Most Recent  Value  SDOH Interventions   Food Insecurity Interventions Intervention Not Indicated  Financial Strain Interventions Intervention Not Indicated  Housing Interventions Intervention Not Indicated   Stress Interventions Intervention Not Indicated  Social Connections Interventions Intervention Not Indicated  Transportation Interventions Intervention Not Indicated       Care Plan  Review of patient past medical history, allergies, medications, health status, including review of consultants reports, laboratory and other test data, was performed as part of comprehensive evaluation for care management services.   Care Plan : RN Care Manager Plan of Care  Updates made by Barbaraann Faster, RN since 11/06/2021 12:00 AM  Completed 11/06/2021   Problem: Complex Care Coordination Needs and disease management in patient withCKD Resolved 11/06/2021  Priority: High  Onset Date: 10/14/2021     Long-Range Goal: Establish Plan of Care for Management Complex SDOH Barriers, disease management and Care Coordination Needs in patient with CKD Completed 11/06/2021  Start Date: 10/14/2021  This Visit's Progress: On track  Recent Progress: On track  Priority: High  Note:   Current Barriers:  Knowledge Deficits related to plan of care for management of CHF and CKD Stage unspecified  Care Coordination needs related to Limited education about CKD, CHF* Barrier Hard of hearing (HOH)   RN CM Clinical Goal(s):  Patient will verbalize understanding of plan for management of CHF and CKD Stage unspecified as evidenced by improved management of CHF & CKD  through collaboration with RN Care manager, provider, and care team.   Interventions: Outreaches for care coordination, disease management, resources, home care/education needs Inter-disciplinary care team collaboration (see longitudinal plan of care) Evaluation of current treatment plan related to  self management and patient's adherence to plan as established by provider 11/06/21 Case closure Pending further outreach from [place] pod RN CM Pt aware   Heart Failure Interventions:  (Status:  Goal Met.) Short Term Goal 11/06/21 met with Dr Ardeth Perfect on  11/01/21 and all labs exam WNL to see cardiology in 12/23 No s/s Basic overview and discussion of pathophysiology of Heart Failure reviewed Provided education on low sodium diet Reviewed Heart Failure Action Plan in depth and provided written copy Assessed need for readable accurate scales in home Advised patient to weigh each morning after emptying bladder Discussed importance of daily weight and advised patient to weigh and record daily Reviewed role of diuretics in prevention of fluid overload and management of heart failure; Discussed the importance of keeping all appointments with provider Screening for signs and symptoms of depression related to chronic disease state  Assessed social determinant of health barriers    Chronic Kidney Disease Interventions:  (Status:  Goal on track:  Yes.) Long Term Goal 11/06/21 continues to attend HD MWF No difficulties voiced Assessed the Patient understanding of chronic kidney disease    Discussed complications of poorly controlled blood pressure such as heart disease, stroke, circulatory complications, vision complications, kidney impairment, sexual dysfunction    Discussed plans with patient for ongoing care management follow up and provided patient with direct contact information for care management team    Screening for signs and symptoms of depression related to chronic disease state      Assessed social determinant of health barriers    10/14/21 Discussed her anemia the first day after so fatigue  11/06/21 discussed her BP WNL and maintaining weight with diet and supplements  Last practice recorded BP readings:  BP Readings from Last 3 Encounters:  03/06/21 132/72  09/20/20 (!) 143/69  08/29/20 (!) 148/62  Most recent eGFR/CrCl: No results found for: "EGFR"  No components found for: "CRCL"  Hypertension Interventions:  (Status:  Goal on track:  Yes.) Short Term Goal 11/06/21  on 11/01/21 BP at pcp 142/80 Last practice recorded BP readings:  BP  Readings from Last 3 Encounters:  03/06/21 132/72  09/20/20 (!) 143/69  08/29/20 (!) 148/62  Most recent eGFR/CrCl: No results found for: "EGFR"  No components found for: "CRCL"  Evaluation of current treatment plan related to hypertension self management and patient's adherence to plan as established by provider Discussed plans with patient for ongoing care management follow up and provided patient with direct contact information for care management team Advised patient, providing education and rationale, to monitor blood pressure daily and record, calling PCP for findings outside established parameters Discussed complications of poorly controlled blood pressure such as heart disease, stroke, circulatory complications, vision complications, kidney impairment, sexual dysfunction Screening for signs and symptoms of depression related to chronic disease state  Assessed social determinant of health barriers 10/14/21 Discussed hypotension risks, white coat syndrome related  Discussed reasons HTN elevates- pain, anxiety, genes    Patient Goals/Self-Care Activities: Take all medications as prescribed Attend all scheduled provider appointments Perform all self care activities independently  Call provider office for new concerns or questions   Follow Up Plan:  The patient has been provided with contact information for the care management team and has been advised to call with any health related questions or concerns.  No further follow up required: case closure Pending further outreach from [place] pod RN CM Pt aware       Plan: The patient has been provided with contact information for the care management team and has been advised to call with any health related questions or concerns.  Case closure Pending further outreach from [place] pod RN CM Pt aware  Joelene Millin L. [place] Management Coordinator Office  [phone]  Fax: [phone]  [address] [place], [place] [address] Website:  [email or web address]

## 2021-11-07 DIAGNOSIS — Z992 Dependence on renal dialysis: Secondary | ICD-10-CM | POA: Diagnosis not present

## 2021-11-07 DIAGNOSIS — N186 End stage renal disease: Secondary | ICD-10-CM | POA: Diagnosis not present

## 2021-11-07 DIAGNOSIS — N2581 Secondary hyperparathyroidism of renal origin: Secondary | ICD-10-CM | POA: Diagnosis not present

## 2021-11-09 DIAGNOSIS — Z992 Dependence on renal dialysis: Secondary | ICD-10-CM | POA: Diagnosis not present

## 2021-11-09 DIAGNOSIS — N186 End stage renal disease: Secondary | ICD-10-CM | POA: Diagnosis not present

## 2021-11-09 DIAGNOSIS — N2581 Secondary hyperparathyroidism of renal origin: Secondary | ICD-10-CM | POA: Diagnosis not present

## 2021-11-12 DIAGNOSIS — Z992 Dependence on renal dialysis: Secondary | ICD-10-CM | POA: Diagnosis not present

## 2021-11-12 DIAGNOSIS — N186 End stage renal disease: Secondary | ICD-10-CM | POA: Diagnosis not present

## 2021-11-12 DIAGNOSIS — N2581 Secondary hyperparathyroidism of renal origin: Secondary | ICD-10-CM | POA: Diagnosis not present

## 2021-11-14 DIAGNOSIS — N186 End stage renal disease: Secondary | ICD-10-CM | POA: Diagnosis not present

## 2021-11-14 DIAGNOSIS — Z992 Dependence on renal dialysis: Secondary | ICD-10-CM | POA: Diagnosis not present

## 2021-11-14 DIAGNOSIS — N2581 Secondary hyperparathyroidism of renal origin: Secondary | ICD-10-CM | POA: Diagnosis not present

## 2021-11-16 DIAGNOSIS — Z992 Dependence on renal dialysis: Secondary | ICD-10-CM | POA: Diagnosis not present

## 2021-11-16 DIAGNOSIS — N186 End stage renal disease: Secondary | ICD-10-CM | POA: Diagnosis not present

## 2021-11-16 DIAGNOSIS — N2581 Secondary hyperparathyroidism of renal origin: Secondary | ICD-10-CM | POA: Diagnosis not present

## 2021-11-19 DIAGNOSIS — N2581 Secondary hyperparathyroidism of renal origin: Secondary | ICD-10-CM | POA: Diagnosis not present

## 2021-11-19 DIAGNOSIS — Z992 Dependence on renal dialysis: Secondary | ICD-10-CM | POA: Diagnosis not present

## 2021-11-19 DIAGNOSIS — N186 End stage renal disease: Secondary | ICD-10-CM | POA: Diagnosis not present

## 2021-11-21 DIAGNOSIS — Z992 Dependence on renal dialysis: Secondary | ICD-10-CM | POA: Diagnosis not present

## 2021-11-21 DIAGNOSIS — N2581 Secondary hyperparathyroidism of renal origin: Secondary | ICD-10-CM | POA: Diagnosis not present

## 2021-11-21 DIAGNOSIS — N186 End stage renal disease: Secondary | ICD-10-CM | POA: Diagnosis not present

## 2021-11-22 ENCOUNTER — Encounter (HOSPITAL_BASED_OUTPATIENT_CLINIC_OR_DEPARTMENT_OTHER): Payer: Self-pay | Admitting: Family

## 2021-11-22 ENCOUNTER — Ambulatory Visit (INDEPENDENT_AMBULATORY_CARE_PROVIDER_SITE_OTHER): Payer: Medicare Other | Admitting: Family

## 2021-11-22 VITALS — BP 156/62 | HR 87 | Ht 63.0 in | Wt 120.4 lb

## 2021-11-22 DIAGNOSIS — I5032 Chronic diastolic (congestive) heart failure: Secondary | ICD-10-CM

## 2021-11-22 DIAGNOSIS — I10 Essential (primary) hypertension: Secondary | ICD-10-CM

## 2021-11-22 NOTE — Patient Instructions (Addendum)
Medication Instructions:  Your Physician recommend you continue on your current medication as directed.    *If you need a refill on your cardiac medications before your next appointment, please call your pharmacy*  Follow-Up: At [facility], you and your health needs are our priority.  As part of our continuing mission to provide you with exceptional heart care, we have created designated Provider Care Teams.  These Care Teams include your primary Cardiologist (physician) and Advanced Practice Providers (APPs -  Physician Assistants and Nurse Practitioners) who all work together to provide you with the care you need, when you need it.  We recommend signing up for the patient portal called "MyChart".  Sign up information is provided on this After Visit Summary.  MyChart is used to connect with patients for Virtual Visits (Telemedicine).  Patients are able to view lab/test results, encounter notes, upcoming appointments, etc.  Non-urgent messages can be sent to your provider as well.   To learn more about what you can do with MyChart, go to [email or web address].    Your next appointment:   Follow up as scheduled

## 2021-11-22 NOTE — Progress Notes (Signed)
Office Visit    Patient Name: Yvonne Powell Date of Encounter: 11/22/2021  PCP:  Velna Hatchet, MD   [place]  Cardiologist:  Skeet Latch, MD  Advanced Practice Provider:  Loel Dubonnet, NP Electrophysiologist:  None      Chief Complaint    Yvonne Powell is a 88 y.o. female presents today for palpitations   Past Medical History    Past Medical History:  Diagnosis Date   Anemia    due to kidney   Chronic diastolic heart failure ([place]) 09/08/2019   DVT (deep venous thrombosis) (HCC)    hx   Gallstones 12/2018   Hypertension    Renal disorder    Thyroid disease    graves dx   Past Surgical History:  Procedure Laterality Date   AV FISTULA PLACEMENT Right 04/07/2017   Procedure: ARTERIOVENOUS (AV) FISTULA CREATION;  Surgeon: Elam Dutch, MD;  Location: [facility];  Service: Vascular;  Laterality: Right;   CHOLECYSTECTOMY N/A 12/23/2018   Procedure: LAPAROSCOPIC CHOLECYSTECTOMY;  Surgeon: Erroll Luna, MD;  Location: [facility] OR;  Service: General;  Laterality: N/A;   COLONOSCOPY     DILATION AND CURETTAGE OF UTERUS     EYE SURGERY     cactaracts   LIPOMA EXCISION     LUMBAR EPIDURAL INJECTION  2018    Allergies  Allergies  Allergen Reactions   Doxycycline Anaphylaxis   Fish Allergy Anaphylaxis, Shortness Of Breath and Itching   Fish-Derived Products Anaphylaxis, Shortness Of Breath and Itching   Iodine Anaphylaxis, Shortness Of Breath, Itching and Other (See Comments)    NO SEAFOOD!!!!   Shellfish Allergy Anaphylaxis, Itching and Swelling   Shellfish-Derived Products Anaphylaxis and Hives   Clindamycin/Lincomycin Diarrhea   Compazine [Prochlorperazine Edisylate] Other (See Comments)    Makes her want to tear off her skin   Sulfa Antibiotics Hives   Calcitriol Itching, Anxiety, Palpitations, Other (See Comments) and Cough    Had to go to the ED because of an increase dose (mouth and throat itching)   Penicillins Rash     History of Present Illness    Yvonne Powell is a 88 y.o. female with a hx of hypertension, hyperlipidemia, chronic diastolic heart failure, prior DVT, ESRD on HD, hypothyroidism last seen 03/06/2021 by Dr. Oval Linsey.  Admitted with acute on chronic shortness of breath and edema 09/2019.  EF 50 to 45%, grade 1 diastolic dysfunction, small pericardial effusion.  Elevated troponin thought to be due to demand ischemia.  Diuresed with IV Lasix and discharged on oral Lasix.  She is AV fistula in place and follows nephrology.  She was started on hemodialysis and BP improvement noted.  ED visit 09/2020 for allergic reaction to Calcitrol.  Last seen 03/06/2021.  Doing overall well from cardiac perspective.  Noted fatigue after dialysis sessions.  She presents today for follow-up.  Notes for couple of weeks she has been having episodes of exertional dyspnea as well as palpitations with sensation of heart racing.  She tells me she learned earlier this week at dialysis that they had lowered her dry weight without her knowledge.  She and her nephrologist discussed yesterday that this may be the etiology of some of her symptoms.  Denies orthopnea, PND, edema, chest pain. Her HD days are T/R/S.  Blood pressure hemodialysis initially 150s and improved to the 130s.  She has been maintaining fluid restrictions.  Eats a low-salt diet at home that her son cooks.  She is hopeful her  previous sometimes will improve with increased dry weight.  EKGs/Labs/Other Studies Reviewed:   The following studies were reviewed today:  EKG:  EKG is ordered today.  The ekg ordered today demonstrates NSR 87 bpm with stable LBBB.  Recent Labs: No results found for requested labs within last 365 days.  Recent Lipid Panel No results found for: "CHOL", "TRIG", "HDL", "CHOLHDL", "VLDL", "LDLCALC", "LDLDIRECT"   Home Medications   Current Meds  Medication Sig   acetaminophen (TYLENOL) 325 MG tablet Take 325 mg by mouth every 6 (six)  hours as needed for mild pain (or headaches).   calcium acetate (PHOSLO) 667 MG capsule Take 1,334 mg by mouth 3 (three) times daily with meals.   Iron-FA-DSS-B Cmplx-Vit C (NEPHRON FA) TABS Take 1 tablet by mouth daily before supper.   levothyroxine (SYNTHROID, LEVOTHROID) 88 MCG tablet Take 88 mcg by mouth daily before breakfast.   lidocaine-prilocaine (EMLA) cream Apply 1 application topically Every Tuesday,Thursday,and Saturday with dialysis.   Methoxy PEG-Epoetin Beta (MIRCERA IJ) Mircera     Review of Systems    All other systems reviewed and are otherwise negative except as noted above.  Physical Exam    VS:  BP (!) 156/62 (BP Location: Left Arm, Patient Position: Sitting, Cuff Size: Normal)   Pulse 87   Ht '5\' 3"'$  (1.6 m)   Wt 120 lb 6.4 oz (54.6 kg)   BMI 21.33 kg/m  , BMI Body mass index is 21.33 kg/m.  Wt Readings from Last 3 Encounters:  11/22/21 120 lb 6.4 oz (54.6 kg)  03/06/21 119 lb 14.4 oz (54.4 kg)  09/20/20 117 lb 4.6 oz (53.2 kg)     GEN: Well nourished, well developed, in no acute distress. HEENT: normal. Neck: Supple, no JVD, carotid bruits, or masses. Cardiac: RRR, no murmurs, rubs, or gallops. No clubbing, cyanosis, edema.  Radials/PT 2+ and equal bilaterally.  Respiratory:  Respirations regular and unlabored, clear to auscultation bilaterally. GI: Soft, nontender, nondistended. MS: No deformity or atrophy. Skin: Warm and dry, no rash. Neuro:  Strength and sensation are intact. Psych: Normal affect.  Assessment & Plan    Palpitations/shortness of breath  -episodes of palpitations with sensation of heart racing with subsequent shortness of breath after dry weight at dialysis was lowered.  Her dry weight was raised yesterday.  Anticipated etiology of palpitations dehydration and that shortness of breath was due to some anxiety element.  Her lung sounds are clear on exam.  Per her report recent TSH, CBC, BMP were stable.  She has not been bothered by  exertional dyspnea or palpitations since yesterday when her dry weight was increased.  She will contact us if palpitations recur at which time we can mail her a ZIO monitor.  LBBB -Stable finding by EKG.  No lightheadedness, dizziness.  Continue to monitor with periodic EKG.  Hypertension -BP mildly elevated in clinic though notes it has been overall well controlled at dialysis sessions.  Will defer further management to nephrology.  ESRD on HD -follows with nephrology.  Diastolic heart failure - Euvolemic and well compensated on exam.  Volume management per HD.  Disposition: Follow up in 6 month(s) with Skeet Latch, MD per patient request   Signed, Loel Dubonnet, NP 11/22/2021, 5:53 PM [place]

## 2021-11-23 DIAGNOSIS — N2581 Secondary hyperparathyroidism of renal origin: Secondary | ICD-10-CM | POA: Diagnosis not present

## 2021-11-23 DIAGNOSIS — Z992 Dependence on renal dialysis: Secondary | ICD-10-CM | POA: Diagnosis not present

## 2021-11-23 DIAGNOSIS — N186 End stage renal disease: Secondary | ICD-10-CM | POA: Diagnosis not present

## 2021-11-26 DIAGNOSIS — N2581 Secondary hyperparathyroidism of renal origin: Secondary | ICD-10-CM | POA: Diagnosis not present

## 2021-11-26 DIAGNOSIS — N186 End stage renal disease: Secondary | ICD-10-CM | POA: Diagnosis not present

## 2021-11-26 DIAGNOSIS — Z992 Dependence on renal dialysis: Secondary | ICD-10-CM | POA: Diagnosis not present

## 2021-11-28 DIAGNOSIS — N2581 Secondary hyperparathyroidism of renal origin: Secondary | ICD-10-CM | POA: Diagnosis not present

## 2021-11-28 DIAGNOSIS — Z992 Dependence on renal dialysis: Secondary | ICD-10-CM | POA: Diagnosis not present

## 2021-11-28 DIAGNOSIS — N186 End stage renal disease: Secondary | ICD-10-CM | POA: Diagnosis not present

## 2021-11-30 DIAGNOSIS — N186 End stage renal disease: Secondary | ICD-10-CM | POA: Diagnosis not present

## 2021-11-30 DIAGNOSIS — N2581 Secondary hyperparathyroidism of renal origin: Secondary | ICD-10-CM | POA: Diagnosis not present

## 2021-11-30 DIAGNOSIS — Z992 Dependence on renal dialysis: Secondary | ICD-10-CM | POA: Diagnosis not present

## 2021-12-03 DIAGNOSIS — I129 Hypertensive chronic kidney disease with stage 1 through stage 4 chronic kidney disease, or unspecified chronic kidney disease: Secondary | ICD-10-CM | POA: Diagnosis not present

## 2021-12-03 DIAGNOSIS — D631 Anemia in chronic kidney disease: Secondary | ICD-10-CM | POA: Diagnosis not present

## 2021-12-03 DIAGNOSIS — N2581 Secondary hyperparathyroidism of renal origin: Secondary | ICD-10-CM | POA: Diagnosis not present

## 2021-12-03 DIAGNOSIS — Z992 Dependence on renal dialysis: Secondary | ICD-10-CM | POA: Diagnosis not present

## 2021-12-03 DIAGNOSIS — N186 End stage renal disease: Secondary | ICD-10-CM | POA: Diagnosis not present

## 2021-12-05 DIAGNOSIS — N2581 Secondary hyperparathyroidism of renal origin: Secondary | ICD-10-CM | POA: Diagnosis not present

## 2021-12-05 DIAGNOSIS — D631 Anemia in chronic kidney disease: Secondary | ICD-10-CM | POA: Diagnosis not present

## 2021-12-05 DIAGNOSIS — Z992 Dependence on renal dialysis: Secondary | ICD-10-CM | POA: Diagnosis not present

## 2021-12-05 DIAGNOSIS — N186 End stage renal disease: Secondary | ICD-10-CM | POA: Diagnosis not present

## 2021-12-07 DIAGNOSIS — D631 Anemia in chronic kidney disease: Secondary | ICD-10-CM | POA: Diagnosis not present

## 2021-12-07 DIAGNOSIS — N186 End stage renal disease: Secondary | ICD-10-CM | POA: Diagnosis not present

## 2021-12-07 DIAGNOSIS — Z992 Dependence on renal dialysis: Secondary | ICD-10-CM | POA: Diagnosis not present

## 2021-12-07 DIAGNOSIS — N2581 Secondary hyperparathyroidism of renal origin: Secondary | ICD-10-CM | POA: Diagnosis not present

## 2021-12-09 DIAGNOSIS — Z780 Asymptomatic menopausal state: Secondary | ICD-10-CM | POA: Diagnosis not present

## 2021-12-10 DIAGNOSIS — Z992 Dependence on renal dialysis: Secondary | ICD-10-CM | POA: Diagnosis not present

## 2021-12-10 DIAGNOSIS — D631 Anemia in chronic kidney disease: Secondary | ICD-10-CM | POA: Diagnosis not present

## 2021-12-10 DIAGNOSIS — N2581 Secondary hyperparathyroidism of renal origin: Secondary | ICD-10-CM | POA: Diagnosis not present

## 2021-12-10 DIAGNOSIS — N186 End stage renal disease: Secondary | ICD-10-CM | POA: Diagnosis not present

## 2021-12-12 DIAGNOSIS — N2581 Secondary hyperparathyroidism of renal origin: Secondary | ICD-10-CM | POA: Diagnosis not present

## 2021-12-12 DIAGNOSIS — D631 Anemia in chronic kidney disease: Secondary | ICD-10-CM | POA: Diagnosis not present

## 2021-12-12 DIAGNOSIS — N186 End stage renal disease: Secondary | ICD-10-CM | POA: Diagnosis not present

## 2021-12-12 DIAGNOSIS — Z992 Dependence on renal dialysis: Secondary | ICD-10-CM | POA: Diagnosis not present

## 2021-12-14 DIAGNOSIS — N186 End stage renal disease: Secondary | ICD-10-CM | POA: Diagnosis not present

## 2021-12-14 DIAGNOSIS — Z992 Dependence on renal dialysis: Secondary | ICD-10-CM | POA: Diagnosis not present

## 2021-12-14 DIAGNOSIS — D631 Anemia in chronic kidney disease: Secondary | ICD-10-CM | POA: Diagnosis not present

## 2021-12-14 DIAGNOSIS — N2581 Secondary hyperparathyroidism of renal origin: Secondary | ICD-10-CM | POA: Diagnosis not present

## 2021-12-17 DIAGNOSIS — N2581 Secondary hyperparathyroidism of renal origin: Secondary | ICD-10-CM | POA: Diagnosis not present

## 2021-12-17 DIAGNOSIS — N186 End stage renal disease: Secondary | ICD-10-CM | POA: Diagnosis not present

## 2021-12-17 DIAGNOSIS — D631 Anemia in chronic kidney disease: Secondary | ICD-10-CM | POA: Diagnosis not present

## 2021-12-17 DIAGNOSIS — Z992 Dependence on renal dialysis: Secondary | ICD-10-CM | POA: Diagnosis not present

## 2021-12-19 DIAGNOSIS — D631 Anemia in chronic kidney disease: Secondary | ICD-10-CM | POA: Diagnosis not present

## 2021-12-19 DIAGNOSIS — Z992 Dependence on renal dialysis: Secondary | ICD-10-CM | POA: Diagnosis not present

## 2021-12-19 DIAGNOSIS — N186 End stage renal disease: Secondary | ICD-10-CM | POA: Diagnosis not present

## 2021-12-19 DIAGNOSIS — N2581 Secondary hyperparathyroidism of renal origin: Secondary | ICD-10-CM | POA: Diagnosis not present

## 2021-12-21 DIAGNOSIS — D631 Anemia in chronic kidney disease: Secondary | ICD-10-CM | POA: Diagnosis not present

## 2021-12-21 DIAGNOSIS — Z992 Dependence on renal dialysis: Secondary | ICD-10-CM | POA: Diagnosis not present

## 2021-12-21 DIAGNOSIS — N186 End stage renal disease: Secondary | ICD-10-CM | POA: Diagnosis not present

## 2021-12-21 DIAGNOSIS — N2581 Secondary hyperparathyroidism of renal origin: Secondary | ICD-10-CM | POA: Diagnosis not present

## 2021-12-24 DIAGNOSIS — D631 Anemia in chronic kidney disease: Secondary | ICD-10-CM | POA: Diagnosis not present

## 2021-12-24 DIAGNOSIS — N186 End stage renal disease: Secondary | ICD-10-CM | POA: Diagnosis not present

## 2021-12-24 DIAGNOSIS — N2581 Secondary hyperparathyroidism of renal origin: Secondary | ICD-10-CM | POA: Diagnosis not present

## 2021-12-24 DIAGNOSIS — Z992 Dependence on renal dialysis: Secondary | ICD-10-CM | POA: Diagnosis not present

## 2021-12-26 DIAGNOSIS — D631 Anemia in chronic kidney disease: Secondary | ICD-10-CM | POA: Diagnosis not present

## 2021-12-26 DIAGNOSIS — N186 End stage renal disease: Secondary | ICD-10-CM | POA: Diagnosis not present

## 2021-12-26 DIAGNOSIS — N2581 Secondary hyperparathyroidism of renal origin: Secondary | ICD-10-CM | POA: Diagnosis not present

## 2021-12-26 DIAGNOSIS — Z992 Dependence on renal dialysis: Secondary | ICD-10-CM | POA: Diagnosis not present

## 2021-12-28 DIAGNOSIS — N186 End stage renal disease: Secondary | ICD-10-CM | POA: Diagnosis not present

## 2021-12-28 DIAGNOSIS — Z992 Dependence on renal dialysis: Secondary | ICD-10-CM | POA: Diagnosis not present

## 2021-12-28 DIAGNOSIS — N2581 Secondary hyperparathyroidism of renal origin: Secondary | ICD-10-CM | POA: Diagnosis not present

## 2021-12-28 DIAGNOSIS — D631 Anemia in chronic kidney disease: Secondary | ICD-10-CM | POA: Diagnosis not present

## 2021-12-31 DIAGNOSIS — D631 Anemia in chronic kidney disease: Secondary | ICD-10-CM | POA: Diagnosis not present

## 2021-12-31 DIAGNOSIS — N186 End stage renal disease: Secondary | ICD-10-CM | POA: Diagnosis not present

## 2021-12-31 DIAGNOSIS — N2581 Secondary hyperparathyroidism of renal origin: Secondary | ICD-10-CM | POA: Diagnosis not present

## 2021-12-31 DIAGNOSIS — Z992 Dependence on renal dialysis: Secondary | ICD-10-CM | POA: Diagnosis not present

## 2022-01-02 DIAGNOSIS — Z992 Dependence on renal dialysis: Secondary | ICD-10-CM | POA: Diagnosis not present

## 2022-01-02 DIAGNOSIS — N2581 Secondary hyperparathyroidism of renal origin: Secondary | ICD-10-CM | POA: Diagnosis not present

## 2022-01-02 DIAGNOSIS — D631 Anemia in chronic kidney disease: Secondary | ICD-10-CM | POA: Diagnosis not present

## 2022-01-02 DIAGNOSIS — N186 End stage renal disease: Secondary | ICD-10-CM | POA: Diagnosis not present

## 2022-01-03 DIAGNOSIS — I129 Hypertensive chronic kidney disease with stage 1 through stage 4 chronic kidney disease, or unspecified chronic kidney disease: Secondary | ICD-10-CM | POA: Diagnosis not present

## 2022-01-03 DIAGNOSIS — N186 End stage renal disease: Secondary | ICD-10-CM | POA: Diagnosis not present

## 2022-01-03 DIAGNOSIS — Z992 Dependence on renal dialysis: Secondary | ICD-10-CM | POA: Diagnosis not present

## 2022-01-04 DIAGNOSIS — D631 Anemia in chronic kidney disease: Secondary | ICD-10-CM | POA: Diagnosis not present

## 2022-01-04 DIAGNOSIS — N186 End stage renal disease: Secondary | ICD-10-CM | POA: Diagnosis not present

## 2022-01-04 DIAGNOSIS — Z992 Dependence on renal dialysis: Secondary | ICD-10-CM | POA: Diagnosis not present

## 2022-01-04 DIAGNOSIS — N2581 Secondary hyperparathyroidism of renal origin: Secondary | ICD-10-CM | POA: Diagnosis not present

## 2022-01-07 DIAGNOSIS — D631 Anemia in chronic kidney disease: Secondary | ICD-10-CM | POA: Diagnosis not present

## 2022-01-07 DIAGNOSIS — N186 End stage renal disease: Secondary | ICD-10-CM | POA: Diagnosis not present

## 2022-01-07 DIAGNOSIS — N2581 Secondary hyperparathyroidism of renal origin: Secondary | ICD-10-CM | POA: Diagnosis not present

## 2022-01-07 DIAGNOSIS — Z992 Dependence on renal dialysis: Secondary | ICD-10-CM | POA: Diagnosis not present

## 2022-01-09 DIAGNOSIS — D631 Anemia in chronic kidney disease: Secondary | ICD-10-CM | POA: Diagnosis not present

## 2022-01-09 DIAGNOSIS — Z992 Dependence on renal dialysis: Secondary | ICD-10-CM | POA: Diagnosis not present

## 2022-01-09 DIAGNOSIS — N2581 Secondary hyperparathyroidism of renal origin: Secondary | ICD-10-CM | POA: Diagnosis not present

## 2022-01-09 DIAGNOSIS — N186 End stage renal disease: Secondary | ICD-10-CM | POA: Diagnosis not present

## 2022-01-11 DIAGNOSIS — D631 Anemia in chronic kidney disease: Secondary | ICD-10-CM | POA: Diagnosis not present

## 2022-01-11 DIAGNOSIS — N186 End stage renal disease: Secondary | ICD-10-CM | POA: Diagnosis not present

## 2022-01-11 DIAGNOSIS — Z992 Dependence on renal dialysis: Secondary | ICD-10-CM | POA: Diagnosis not present

## 2022-01-11 DIAGNOSIS — N2581 Secondary hyperparathyroidism of renal origin: Secondary | ICD-10-CM | POA: Diagnosis not present

## 2022-01-13 DIAGNOSIS — H349 Unspecified retinal vascular occlusion: Secondary | ICD-10-CM | POA: Diagnosis not present

## 2022-01-13 DIAGNOSIS — Z961 Presence of intraocular lens: Secondary | ICD-10-CM | POA: Diagnosis not present

## 2022-01-13 DIAGNOSIS — H348312 Tributary (branch) retinal vein occlusion, right eye, stable: Secondary | ICD-10-CM | POA: Diagnosis not present

## 2022-01-13 DIAGNOSIS — H5203 Hypermetropia, bilateral: Secondary | ICD-10-CM | POA: Diagnosis not present

## 2022-01-14 DIAGNOSIS — Z992 Dependence on renal dialysis: Secondary | ICD-10-CM | POA: Diagnosis not present

## 2022-01-14 DIAGNOSIS — N2581 Secondary hyperparathyroidism of renal origin: Secondary | ICD-10-CM | POA: Diagnosis not present

## 2022-01-14 DIAGNOSIS — D631 Anemia in chronic kidney disease: Secondary | ICD-10-CM | POA: Diagnosis not present

## 2022-01-14 DIAGNOSIS — N186 End stage renal disease: Secondary | ICD-10-CM | POA: Diagnosis not present

## 2022-01-16 DIAGNOSIS — N2581 Secondary hyperparathyroidism of renal origin: Secondary | ICD-10-CM | POA: Diagnosis not present

## 2022-01-16 DIAGNOSIS — N186 End stage renal disease: Secondary | ICD-10-CM | POA: Diagnosis not present

## 2022-01-16 DIAGNOSIS — D631 Anemia in chronic kidney disease: Secondary | ICD-10-CM | POA: Diagnosis not present

## 2022-01-16 DIAGNOSIS — Z992 Dependence on renal dialysis: Secondary | ICD-10-CM | POA: Diagnosis not present

## 2022-01-18 DIAGNOSIS — N2581 Secondary hyperparathyroidism of renal origin: Secondary | ICD-10-CM | POA: Diagnosis not present

## 2022-01-18 DIAGNOSIS — Z992 Dependence on renal dialysis: Secondary | ICD-10-CM | POA: Diagnosis not present

## 2022-01-18 DIAGNOSIS — N186 End stage renal disease: Secondary | ICD-10-CM | POA: Diagnosis not present

## 2022-01-18 DIAGNOSIS — D631 Anemia in chronic kidney disease: Secondary | ICD-10-CM | POA: Diagnosis not present

## 2022-01-21 DIAGNOSIS — N2581 Secondary hyperparathyroidism of renal origin: Secondary | ICD-10-CM | POA: Diagnosis not present

## 2022-01-21 DIAGNOSIS — D631 Anemia in chronic kidney disease: Secondary | ICD-10-CM | POA: Diagnosis not present

## 2022-01-21 DIAGNOSIS — Z992 Dependence on renal dialysis: Secondary | ICD-10-CM | POA: Diagnosis not present

## 2022-01-21 DIAGNOSIS — N186 End stage renal disease: Secondary | ICD-10-CM | POA: Diagnosis not present

## 2022-01-23 DIAGNOSIS — Z992 Dependence on renal dialysis: Secondary | ICD-10-CM | POA: Diagnosis not present

## 2022-01-23 DIAGNOSIS — D631 Anemia in chronic kidney disease: Secondary | ICD-10-CM | POA: Diagnosis not present

## 2022-01-23 DIAGNOSIS — N2581 Secondary hyperparathyroidism of renal origin: Secondary | ICD-10-CM | POA: Diagnosis not present

## 2022-01-23 DIAGNOSIS — N186 End stage renal disease: Secondary | ICD-10-CM | POA: Diagnosis not present

## 2022-01-25 DIAGNOSIS — D631 Anemia in chronic kidney disease: Secondary | ICD-10-CM | POA: Diagnosis not present

## 2022-01-25 DIAGNOSIS — N2581 Secondary hyperparathyroidism of renal origin: Secondary | ICD-10-CM | POA: Diagnosis not present

## 2022-01-25 DIAGNOSIS — Z992 Dependence on renal dialysis: Secondary | ICD-10-CM | POA: Diagnosis not present

## 2022-01-25 DIAGNOSIS — N186 End stage renal disease: Secondary | ICD-10-CM | POA: Diagnosis not present

## 2022-01-28 DIAGNOSIS — D631 Anemia in chronic kidney disease: Secondary | ICD-10-CM | POA: Diagnosis not present

## 2022-01-28 DIAGNOSIS — N186 End stage renal disease: Secondary | ICD-10-CM | POA: Diagnosis not present

## 2022-01-28 DIAGNOSIS — Z992 Dependence on renal dialysis: Secondary | ICD-10-CM | POA: Diagnosis not present

## 2022-01-28 DIAGNOSIS — N2581 Secondary hyperparathyroidism of renal origin: Secondary | ICD-10-CM | POA: Diagnosis not present

## 2022-01-29 ENCOUNTER — Other Ambulatory Visit: Payer: Self-pay

## 2022-01-29 ENCOUNTER — Emergency Department (HOSPITAL_COMMUNITY)
Admission: EM | Admit: 2022-01-29 | Discharge: 2022-01-30 | Discharge status: Against Medical Advice | Payer: Medicare Other | Attending: Emergency Medicine | Admitting: Emergency Medicine

## 2022-01-29 DIAGNOSIS — Z5321 Procedure and treatment not carried out due to patient leaving prior to being seen by health care provider: Secondary | ICD-10-CM | POA: Insufficient documentation

## 2022-01-29 DIAGNOSIS — R202 Paresthesia of skin: Secondary | ICD-10-CM | POA: Insufficient documentation

## 2022-01-29 DIAGNOSIS — K209 Esophagitis, unspecified without bleeding: Secondary | ICD-10-CM | POA: Diagnosis not present

## 2022-01-29 DIAGNOSIS — T7840XA Allergy, unspecified, initial encounter: Secondary | ICD-10-CM | POA: Diagnosis not present

## 2022-01-29 DIAGNOSIS — R200 Anesthesia of skin: Secondary | ICD-10-CM | POA: Diagnosis not present

## 2022-01-29 DIAGNOSIS — R0989 Other specified symptoms and signs involving the circulatory and respiratory systems: Secondary | ICD-10-CM | POA: Diagnosis not present

## 2022-01-29 DIAGNOSIS — I10 Essential (primary) hypertension: Secondary | ICD-10-CM | POA: Diagnosis not present

## 2022-01-29 LAB — COMPREHENSIVE METABOLIC PANEL
ALT: 12 U/L (ref 0–44)
AST: 20 U/L (ref 15–41)
Albumin: 3.7 g/dL (ref 3.5–5.0)
Alkaline Phosphatase: 94 U/L (ref 38–126)
Anion gap: 10 (ref 5–15)
BUN: 39 mg/dL — ABNORMAL HIGH (ref 8–23)
CO2: 32 mmol/L (ref 22–32)
Calcium: 8.9 mg/dL (ref 8.9–10.3)
Chloride: 92 mmol/L — ABNORMAL LOW (ref 98–111)
Creatinine, Ser: 6.86 mg/dL — ABNORMAL HIGH (ref 0.44–1.00)
GFR, Estimated: 5 mL/min — ABNORMAL LOW (ref >60)
Glucose, Bld: 104 mg/dL — ABNORMAL HIGH (ref 70–99)
Potassium: 4 mmol/L (ref 3.5–5.1)
Sodium: 134 mmol/L — ABNORMAL LOW (ref 135–145)
Total Bilirubin: 1.1 mg/dL (ref 0.3–1.2)
Total Protein: 6.3 g/dL — ABNORMAL LOW (ref 6.5–8.1)

## 2022-01-29 LAB — CBC WITH DIFFERENTIAL/PLATELET
Abs Immature Granulocytes: 0.01 10*3/uL (ref 0.00–0.07)
Basophils Absolute: 0.1 10*3/uL (ref 0.0–0.1)
Basophils Relative: 1 %
Eosinophils Absolute: 0.1 10*3/uL (ref 0.0–0.5)
Eosinophils Relative: 1 %
HCT: 27.9 % — ABNORMAL LOW (ref 36.0–46.0)
Hemoglobin: 9.6 g/dL — ABNORMAL LOW (ref 12.0–15.0)
Immature Granulocytes: 0 %
Lymphocytes Relative: 28 %
Lymphs Abs: 1.4 10*3/uL (ref 0.7–4.0)
MCH: 33.3 pg (ref 26.0–34.0)
MCHC: 34.4 g/dL (ref 30.0–36.0)
MCV: 96.9 fL (ref 80.0–100.0)
Monocytes Absolute: 0.5 10*3/uL (ref 0.1–1.0)
Monocytes Relative: 10 %
Neutro Abs: 3.1 10*3/uL (ref 1.7–7.7)
Neutrophils Relative %: 60 %
Platelets: 169 10*3/uL (ref 150–400)
RBC: 2.88 MIL/uL — ABNORMAL LOW (ref 3.87–5.11)
RDW: 12.8 % (ref 11.5–15.5)
WBC: 5.1 10*3/uL (ref 4.0–10.5)
nRBC: 0 % (ref 0.0–0.2)

## 2022-01-29 LAB — LIPASE, BLOOD: Lipase: 42 U/L (ref 11–51)

## 2022-01-29 LAB — TROPONIN I (HIGH SENSITIVITY): Troponin I (High Sensitivity): 26 ng/L — ABNORMAL HIGH (ref <18)

## 2022-01-29 NOTE — ED Notes (Signed)
Patient son upset over wait time and xray not being ordered. Triage PA made aware. Patient states she is feeling better and is leaving

## 2022-01-29 NOTE — ED Triage Notes (Signed)
Patient BIB [facility] from home. Patient was prescribed pantoprazole for esophagitis earlier today by PCP, took one at 1700 and shortly after started having numbness in her mouth and lip as well as feeling like she was having difficulty swallowing. Patient took '50mg'$  benadryl prior to EMS arrival to scene which patient states has helped. No obvious oral swelling, no hives, no dyspnea.

## 2022-01-29 NOTE — ED Triage Notes (Signed)
Went to her PCP this AM, was given protonix for esophagitis and within 15 min experienced tingling lips. Took '50mg'$  benadryl 5:45pm.  Felt difficult to swallow but had been having that with her esophagitis. Denies hives, wheeze In triage, breathing regular and unlabored, full sentences

## 2022-01-29 NOTE — ED Provider Triage Note (Signed)
Emergency Medicine Provider Triage Evaluation Note  Yvonne Powell , a 88 y.o. female  was evaluated in triage.  Pt complains of numbness and tingling in her lips.  Patient has a history of esophagitis.  She is prescribed Protonix by her PCP.  She took the medication and about 15 minutes later began having numbness and tingling in her lips and some difficulty swallowing.  She is unsure if the difficulty swallowing is new because she has been having issues with that be due to her esophagitis.  She denies any active chest pain, nausea, vomiting, loss of consciousness, hives or wheezing.  Review of Systems  Positive: Numbness and tingling around the lips Negative: Loss of consciousness  Physical Exam  BP (!) 176/87 (BP Location: Left Arm)   Pulse 85   Temp 98.3 F (36.8 C) (Oral)   Resp 18   Wt 55 kg   SpO2 100%   BMI 21.48 kg/m  Gen:   Awake, no distress   Resp:  Normal effort  MSK:   Moves extremities without difficulty  Other:  Obvious angioedema wheezing shortness of breath or other signs of allergic reaction  Medical Decision Making  Medically screening exam initiated at 7:29 PM.  Appropriate orders placed.  Yvonne Powell was informed that the remainder of the evaluation will be completed by another provider, this initial triage assessment does not replace that evaluation, and the importance of remaining in the ED until their evaluation is complete.  Work-up initiated   Margarita Mail, PA-C 01/29/22 1931

## 2022-01-30 DIAGNOSIS — Z992 Dependence on renal dialysis: Secondary | ICD-10-CM | POA: Diagnosis not present

## 2022-01-30 DIAGNOSIS — N2581 Secondary hyperparathyroidism of renal origin: Secondary | ICD-10-CM | POA: Diagnosis not present

## 2022-01-30 DIAGNOSIS — D631 Anemia in chronic kidney disease: Secondary | ICD-10-CM | POA: Diagnosis not present

## 2022-01-30 DIAGNOSIS — N186 End stage renal disease: Secondary | ICD-10-CM | POA: Diagnosis not present

## 2022-01-31 DIAGNOSIS — T7840XA Allergy, unspecified, initial encounter: Secondary | ICD-10-CM | POA: Diagnosis not present

## 2022-02-01 ENCOUNTER — Emergency Department (HOSPITAL_BASED_OUTPATIENT_CLINIC_OR_DEPARTMENT_OTHER)
Admission: EM | Admit: 2022-02-01 | Discharge: 2022-02-01 | Disposition: A | Discharge status: Home / Self Care | Payer: Medicare Other | Attending: Emergency Medicine | Admitting: Emergency Medicine

## 2022-02-01 ENCOUNTER — Other Ambulatory Visit: Payer: Self-pay

## 2022-02-01 ENCOUNTER — Emergency Department (HOSPITAL_BASED_OUTPATIENT_CLINIC_OR_DEPARTMENT_OTHER): Payer: Medicare Other

## 2022-02-01 ENCOUNTER — Encounter (HOSPITAL_BASED_OUTPATIENT_CLINIC_OR_DEPARTMENT_OTHER): Payer: Self-pay

## 2022-02-01 DIAGNOSIS — I5032 Chronic diastolic (congestive) heart failure: Secondary | ICD-10-CM | POA: Insufficient documentation

## 2022-02-01 DIAGNOSIS — F458 Other somatoform disorders: Secondary | ICD-10-CM | POA: Diagnosis not present

## 2022-02-01 DIAGNOSIS — R1310 Dysphagia, unspecified: Secondary | ICD-10-CM | POA: Insufficient documentation

## 2022-02-01 DIAGNOSIS — E039 Hypothyroidism, unspecified: Secondary | ICD-10-CM | POA: Insufficient documentation

## 2022-02-01 DIAGNOSIS — N186 End stage renal disease: Secondary | ICD-10-CM | POA: Diagnosis not present

## 2022-02-01 DIAGNOSIS — I132 Hypertensive heart and chronic kidney disease with heart failure and with stage 5 chronic kidney disease, or end stage renal disease: Secondary | ICD-10-CM | POA: Diagnosis not present

## 2022-02-01 DIAGNOSIS — D631 Anemia in chronic kidney disease: Secondary | ICD-10-CM | POA: Diagnosis not present

## 2022-02-01 DIAGNOSIS — R09A2 Foreign body sensation, throat: Secondary | ICD-10-CM

## 2022-02-01 DIAGNOSIS — N2581 Secondary hyperparathyroidism of renal origin: Secondary | ICD-10-CM | POA: Diagnosis not present

## 2022-02-01 DIAGNOSIS — R0989 Other specified symptoms and signs involving the circulatory and respiratory systems: Secondary | ICD-10-CM | POA: Insufficient documentation

## 2022-02-01 DIAGNOSIS — Z79899 Other long term (current) drug therapy: Secondary | ICD-10-CM | POA: Insufficient documentation

## 2022-02-01 DIAGNOSIS — Z992 Dependence on renal dialysis: Secondary | ICD-10-CM | POA: Diagnosis not present

## 2022-02-01 DIAGNOSIS — Z8616 Personal history of COVID-19: Secondary | ICD-10-CM | POA: Insufficient documentation

## 2022-02-01 DIAGNOSIS — I700 Atherosclerosis of aorta: Secondary | ICD-10-CM | POA: Diagnosis not present

## 2022-02-01 LAB — CBC WITH DIFFERENTIAL/PLATELET
Abs Immature Granulocytes: 0.01 10*3/uL (ref 0.00–0.07)
Basophils Absolute: 0.1 10*3/uL (ref 0.0–0.1)
Basophils Relative: 1 %
Eosinophils Absolute: 0.1 10*3/uL (ref 0.0–0.5)
Eosinophils Relative: 1 %
HCT: 29.4 % — ABNORMAL LOW (ref 36.0–46.0)
Hemoglobin: 10.1 g/dL — ABNORMAL LOW (ref 12.0–15.0)
Immature Granulocytes: 0 %
Lymphocytes Relative: 37 %
Lymphs Abs: 1.8 10*3/uL (ref 0.7–4.0)
MCH: 33.3 pg (ref 26.0–34.0)
MCHC: 34.4 g/dL (ref 30.0–36.0)
MCV: 97 fL (ref 80.0–100.0)
Monocytes Absolute: 0.5 10*3/uL (ref 0.1–1.0)
Monocytes Relative: 11 %
Neutro Abs: 2.5 10*3/uL (ref 1.7–7.7)
Neutrophils Relative %: 50 %
Platelets: 178 10*3/uL (ref 150–400)
RBC: 3.03 MIL/uL — ABNORMAL LOW (ref 3.87–5.11)
RDW: 13 % (ref 11.5–15.5)
WBC: 4.9 10*3/uL (ref 4.0–10.5)
nRBC: 0 % (ref 0.0–0.2)

## 2022-02-01 LAB — COMPREHENSIVE METABOLIC PANEL
ALT: 10 U/L (ref 0–44)
AST: 17 U/L (ref 15–41)
Albumin: 4.2 g/dL (ref 3.5–5.0)
Alkaline Phosphatase: 94 U/L (ref 38–126)
Anion gap: 13 (ref 5–15)
BUN: 36 mg/dL — ABNORMAL HIGH (ref 8–23)
CO2: 30 mmol/L (ref 22–32)
Calcium: 8.9 mg/dL (ref 8.9–10.3)
Chloride: 92 mmol/L — ABNORMAL LOW (ref 98–111)
Creatinine, Ser: 6.1 mg/dL — ABNORMAL HIGH (ref 0.44–1.00)
GFR, Estimated: 6 mL/min — ABNORMAL LOW (ref >60)
Glucose, Bld: 98 mg/dL (ref 70–99)
Potassium: 3.7 mmol/L (ref 3.5–5.1)
Sodium: 135 mmol/L (ref 135–145)
Total Bilirubin: 0.8 mg/dL (ref 0.3–1.2)
Total Protein: 6.7 g/dL (ref 6.5–8.1)

## 2022-02-01 MED ORDER — LIDOCAINE VISCOUS HCL 2 % MT SOLN
15.0000 mL | Freq: Once | OROMUCOSAL | Status: DC
  Filled 2022-02-01: qty 15

## 2022-02-01 NOTE — ED Notes (Signed)
Pt agreeable with d/c plan as discussed by provider- this nurse has verbally reinforced d/c instructions and provided pt with written copy - pt acknowledges verbal understanding and denies any addl questions concerns needs - escorted to exit via w/c by this nurse; accompanied/driven home by son.

## 2022-02-01 NOTE — ED Notes (Signed)
Late entry -- Pt returned from CT dept via w/c - awake and alert; no acute changes or distress noted

## 2022-02-01 NOTE — ED Provider Notes (Signed)
[facility] EMERGENCY DEPT Provider Note  CSN: [id number] Arrival date & time: 02/01/22 0041  Chief Complaint(s) Dysphagia  HPI Yvonne Powell is a 88 y.o. female h/o esophagitis here for dysphagia.  Patient reports that she feels like something is stuck in her throat which would not allow water to go down.  She was seen by her PCP recently and told she had esophagitis.  Patient is able to tolerate oral secretions.  Denies any nausea or vomiting.  No chest pain or shortness of breath.  No abdominal pain.  No other physical complaints.   The history is provided by the patient.    Past Medical History Past Medical History:  Diagnosis Date   Anemia    due to kidney   Chronic diastolic heart failure ([place]) 09/08/2019   DVT (deep venous thrombosis) (H[facility])    hx   Gallstones 12/2018   Hypertension    Renal disorder    Thyroid disease    graves dx   Patient Active Problem List   Diagnosis Date Noted   Pruritus, unspecified 09/20/2020   COVID-19 08/28/2020   Encounter for immunization 11/12/2019   Hearing loss 10/26/2019   Allergy, unspecified, initial encounter 10/17/2019   Arteriovenous fistula, acquired ([place]) 10/17/2019   Hypertensive chronic kidney disease with stage 1 through stage 4 chronic kidney disease, or unspecified chronic kidney disease 10/17/2019   Coagulation defect, unspecified ([place]) 10/17/2019   Iron deficiency anemia, unspecified 10/17/2019   Other spondylosis with myelopathy, lumbar region 10/17/2019   Personal history of other diseases of the digestive system 10/17/2019   Personal history of other drug therapy 10/17/2019   Secondary hyperparathyroidism of renal origin ([place]) 10/17/2019   Shortness of breath 10/17/2019   Unilateral primary osteoarthritis, left hip 10/17/2019   Unspecified hemorrhoids 10/17/2019   Chronic diastolic heart failure ([place]) 09/08/2019   Anemia in chronic kidney disease 09/08/2019   Elevated troponin 09/08/2019    Hyperkalemia 09/08/2019   Hypertension    Hypothyroidism    End stage renal disease ([place]) 12/21/2018   Cholelithiasis 12/21/2018   Intractable generalized abdominal pain 12/20/2018   Home Medication(s) Prior to Admission medications   Medication Sig Start Date End Date Taking? Authorizing Provider  acetaminophen (TYLENOL) 325 MG tablet Take 325 mg by mouth every 6 (six) hours as needed for mild pain (or headaches).    [provider]  calcium acetate (PHOSLO) 667 MG capsule Take 1,334 mg by mouth 3 (three) times daily with meals. 03/01/20   [provider]  Iron-FA-DSS-B Cmplx-Vit C (NEPHRON FA) TABS Take 1 tablet by mouth daily before supper. 02/23/20   [provider]  levothyroxine (SYNTHROID, LEVOTHROID) 88 MCG tablet Take 88 mcg by mouth daily before breakfast.    [provider]  lidocaine-prilocaine (EMLA) cream Apply 1 application topically Every Tuesday,Thursday,and Saturday with dialysis. 08/22/20   [provider]  Methoxy PEG-Epoetin Beta (MIRCERA IJ) Mircera 10/24/21 10/23/22  [provider]  Allergies Calcitriol, Doxycycline, Fish allergy, Fish-derived products, Iodine, Penicillin g, Shellfish allergy, Shellfish-derived products, Clindamycin/lincomycin, Compazine [prochlorperazine edisylate], Prochlorperazine, Sulfa antibiotics, and Penicillins  Review of Systems Review of Systems As noted in HPI  Physical Exam Vital Signs  I have reviewed the triage vital signs BP (!) 173/72   Pulse 76   Temp 98.5 F (36.9 C) (Oral)   Resp 20   SpO2 96%   Physical Exam Vitals reviewed.  Constitutional:      General: She is not in acute distress.    Appearance: She is well-developed. She is not diaphoretic.  HENT:     Head: Normocephalic and atraumatic.     Nose: Nose normal.  Eyes:     General: No  scleral icterus.       Right eye: No discharge.        Left eye: No discharge.     Conjunctiva/sclera: Conjunctivae normal.     Pupils: Pupils are equal, round, and reactive to light.  Cardiovascular:     Rate and Rhythm: Normal rate and regular rhythm.     Heart sounds: No murmur heard.    No friction rub. No gallop.  Pulmonary:     Effort: Pulmonary effort is normal. No respiratory distress.     Breath sounds: Normal breath sounds. No stridor. No rales.  Abdominal:     General: There is no distension.     Palpations: Abdomen is soft.     Tenderness: There is no abdominal tenderness.  Musculoskeletal:        General: No tenderness.     Cervical back: Normal range of motion and neck supple.  Skin:    General: Skin is warm and dry.     Findings: No erythema or rash.  Neurological:     Mental Status: She is alert and oriented to person, place, and time.     ED Results and Treatments Labs (all labs ordered are listed, but only abnormal results are displayed) Labs Reviewed  CBC WITH DIFFERENTIAL/PLATELET - Abnormal; Notable for the following components:      Result Value   RBC 3.03 (*)    Hemoglobin 10.1 (*)    HCT 29.4 (*)    All other components within normal limits  COMPREHENSIVE METABOLIC PANEL - Abnormal; Notable for the following components:   Chloride 92 (*)    BUN 36 (*)    Creatinine, Ser 6.10 (*)    GFR, Estimated 6 (*)    All other components within normal limits                                                                                                                         EKG  EKG Interpretation  Date/Time:    Ventricular Rate:    PR Interval:    QRS Duration:   QT Interval:    QTC Calculation:   R Axis:     Text Interpretation:         Radiology  CT Chest Wo Contrast  Result Date: 02/01/2022 CLINICAL DATA:  Complains of globus sensation. EXAM: CT CHEST WITHOUT CONTRAST TECHNIQUE: Multidetector CT imaging of the chest was performed  following the standard protocol without IV contrast. RADIATION DOSE REDUCTION: This exam was performed according to the departmental dose-optimization program which includes automated exposure control, adjustment of the mA and/or kV according to patient size and/or use of iterative reconstruction technique. COMPARISON:  None Available. FINDINGS: Cardiovascular: Heart size appears within normal limits. Aortic atherosclerosis and coronary artery calcifications. No significant pericardial effusion. Mediastinum/Nodes: Thyroid gland and trachea are unremarkable. The esophagus appears mildly patulous which may reflect underlying dysmotility. No focal esophageal abnormality noted. No enlarged axillary or mediastinal lymph nodes. Lungs/Pleura: No pleural effusion, airspace consolidation, atelectasis, or pneumothorax. No suspicious pulmonary nodule or mass identified. Upper Abdomen: No acute abnormality. Status post cholecystectomy. Hyperdense and low-attenuation kidney lesions are partially visualized arising off the upper pole of both kidneys. These are suboptimally evaluated on today's exam. Status post cholecystectomy. Aortic atherosclerotic calcifications. Musculoskeletal: Degenerative disc disease identified within the thoracic spine. No acute or suspicious osseous findings. IMPRESSION: 1. No acute cardiopulmonary abnormalities. 2. The esophagus appears mildly patulous which may reflect underlying dysmotility. 3. Coronary artery calcifications. 4.  Aortic Atherosclerosis (ICD10-I70.0). Electronically Signed   By: Kerby Moors M.D.   On: 02/01/2022 04:25    Medications Ordered in ED Medications  lidocaine (XYLOCAINE) 2 % viscous mouth solution 15 mL (15 mLs Mouth/Throat Patient Refused/Not Given 02/01/22 0232)                                                                                                                                     Procedures Procedures  (including critical care time)  Medical  Decision Making / ED Course   Medical Decision Making Amount and/or Complexity of Data Reviewed Labs: ordered. Decision-making details documented in ED Course. Radiology: ordered and independent interpretation performed. Decision-making details documented in ED Course.  Risk Prescription drug management.    Patient in no respiratory distress and tolerating her oral secretions.  She seems very anxious. I have low suspicion for food impaction we will obtain a CT scan. CBC without leukocytosis.  Stable hemoglobin. Metabolic panel without significant electrolyte derangements or renal sufficiency. CT without evidence of wound infection.  She does have slight distention of the esophagus likely related to decreased motility.       Final Clinical Impression(s) / ED Diagnoses Final diagnoses:  Globus sensation   The patient appears reasonably screened and/or stabilized for discharge and I doubt any other medical condition or other [facility] requiring further screening, evaluation, or treatment in the ED at this time. I have discussed the findings, Dx and Tx plan with the patient/family who expressed understanding and agree(s) with the plan. Discharge instructions discussed at length. The patient/family was given strict return precautions who verbalized understanding of the instructions. No further questions at time of discharge.  Disposition: Discharge  Condition: Good  ED Discharge Orders     None       Follow Up: Velna Hatchet, [place] [address] [phone]  Call  to schedule an appointment for close follow up           This chart was dictated using voice recognition software.  Despite best efforts to proofread,  errors can occur which can change the documentation meaning.    Fatima Blank, MD 02/01/22 [phone]

## 2022-02-01 NOTE — ED Notes (Signed)
Pt anxious re: Viscous Lidocaine order -- pt anxious that she will have an allergic reaction although she reports no known drug allergy to Lidocaine in the past --- attempted to re-assure pt that we would monitor closely for adverse reaction and address promptly however pt remained anxious and panicky -- Dr. Leonette Monarch notified via secure chat

## 2022-02-01 NOTE — ED Notes (Signed)
Late entry -- pt provided warm blanket and oriented to call bell.  Awake and alert lying in bed- speech clear; RR even and unlabored on RA with obvious signs of distress- continuous pulse ox monitoring maintained.  Will monitor for acute changes and maintain plan of care as pt awaits provider.  Son at bedside.

## 2022-02-01 NOTE — ED Triage Notes (Signed)
Pt arrives [facility] from home with c/o difficulty swallowing.   Recently dx of esophagitis this week and started on Protonix. Had an allergic reaction and stopped taking medications.   Reports ongoing difficulty swallowing. When she drinks water feels as if it is pooling.   Clear breath signs. Airway patent. A/ox4. Ambulatory.   Dialysis T, TH, Sa. Right arm restriction.

## 2022-02-01 NOTE — ED Notes (Signed)
Patient transported to CT via w/c at this time - no acute changes noted

## 2022-02-02 DIAGNOSIS — Z992 Dependence on renal dialysis: Secondary | ICD-10-CM | POA: Diagnosis not present

## 2022-02-02 DIAGNOSIS — N186 End stage renal disease: Secondary | ICD-10-CM | POA: Diagnosis not present

## 2022-02-02 DIAGNOSIS — I129 Hypertensive chronic kidney disease with stage 1 through stage 4 chronic kidney disease, or unspecified chronic kidney disease: Secondary | ICD-10-CM | POA: Diagnosis not present

## 2022-02-03 DIAGNOSIS — Z9049 Acquired absence of other specified parts of digestive tract: Secondary | ICD-10-CM | POA: Diagnosis not present

## 2022-02-03 DIAGNOSIS — N281 Cyst of kidney, acquired: Secondary | ICD-10-CM | POA: Diagnosis not present

## 2022-02-03 DIAGNOSIS — I700 Atherosclerosis of aorta: Secondary | ICD-10-CM | POA: Diagnosis not present

## 2022-02-03 DIAGNOSIS — D49512 Neoplasm of unspecified behavior of left kidney: Secondary | ICD-10-CM | POA: Diagnosis not present

## 2022-02-03 DIAGNOSIS — C642 Malignant neoplasm of left kidney, except renal pelvis: Secondary | ICD-10-CM | POA: Diagnosis not present

## 2022-02-04 DIAGNOSIS — Z992 Dependence on renal dialysis: Secondary | ICD-10-CM | POA: Diagnosis not present

## 2022-02-04 DIAGNOSIS — N2581 Secondary hyperparathyroidism of renal origin: Secondary | ICD-10-CM | POA: Diagnosis not present

## 2022-02-04 DIAGNOSIS — N186 End stage renal disease: Secondary | ICD-10-CM | POA: Diagnosis not present

## 2022-02-04 DIAGNOSIS — D631 Anemia in chronic kidney disease: Secondary | ICD-10-CM | POA: Diagnosis not present

## 2022-02-05 ENCOUNTER — Telehealth: Payer: Self-pay

## 2022-02-05 ENCOUNTER — Other Ambulatory Visit (HOSPITAL_COMMUNITY): Payer: Self-pay

## 2022-02-05 DIAGNOSIS — R059 Cough, unspecified: Secondary | ICD-10-CM

## 2022-02-05 DIAGNOSIS — R1310 Dysphagia, unspecified: Secondary | ICD-10-CM

## 2022-02-05 NOTE — Telephone Encounter (Signed)
    Patient  visit on  9/30 at [facility]  Have you been able to follow up with your primary care physician? yes  The patient was or was not able to obtain any needed medicine or equipment. yes  Are there diet recommendations that you are having difficulty following?na  Patient expresses understanding of discharge instructions and education provided has no other needs at this time. yes     [facility], Care Management  [phone] [address], [place], [place] [address] Phone: [phone] Email: [email or web address]

## 2022-02-06 DIAGNOSIS — Z992 Dependence on renal dialysis: Secondary | ICD-10-CM | POA: Diagnosis not present

## 2022-02-06 DIAGNOSIS — N186 End stage renal disease: Secondary | ICD-10-CM | POA: Diagnosis not present

## 2022-02-06 DIAGNOSIS — D631 Anemia in chronic kidney disease: Secondary | ICD-10-CM | POA: Diagnosis not present

## 2022-02-06 DIAGNOSIS — N2581 Secondary hyperparathyroidism of renal origin: Secondary | ICD-10-CM | POA: Diagnosis not present

## 2022-02-07 ENCOUNTER — Ambulatory Visit (INDEPENDENT_AMBULATORY_CARE_PROVIDER_SITE_OTHER): Payer: Medicare Other | Admitting: Internal Medicine

## 2022-02-07 ENCOUNTER — Encounter: Payer: Self-pay | Admitting: Internal Medicine

## 2022-02-07 VITALS — BP 130/78 | HR 95 | Ht 64.0 in | Wt 113.8 lb

## 2022-02-07 DIAGNOSIS — R1310 Dysphagia, unspecified: Secondary | ICD-10-CM

## 2022-02-07 MED ORDER — FAMOTIDINE 40 MG PO TABS: 40.0000 mg | ORAL_TABLET | Freq: Two times a day (BID) | ORAL | 0 refills | Status: DC

## 2022-02-07 NOTE — Progress Notes (Signed)
HISTORY OF PRESENT ILLNESS:  Yvonne Powell is a 88 y.o. female, mother of Yvonne Powell, with multiple medical problems including end-stage renal disease on hemodialysis who was sent today by her primary care provider regarding dysphagia with associated globus symptom.  Patient is new to this practice.  She tells me that she was in her usual state of health until about 10 days ago when she developed what sounds like a transient food impaction while eating [place] toast.  No history of problems like that prior.  Thereafter she reported a globus type sensation or discomfort for which she went to the emergency room on 2 separate occasions.  Initially January 29, 2022 and most recently February 01, 2022.  Blood work from each visit was unremarkable except for abnormal renal function and anemia with hemoglobin 10.1.  Normal liver tests and lipase.  CT of the chest revealed the esophagus to be mildly patulous.  No other abnormalities.  She was placed on pantoprazole.  Describes sensation of swelling of the lips after 1 dose.  She is now taking Pepcid 20 mg daily.  She tells me that she still has a sensation of something in the substernal region which seems to be "moving down".  Is eating a modified diet without difficulties but is afraid to eat more solid foods.  She does have a modified barium swallow scheduled for next week.  She denies heartburn or indigestion.  REVIEW OF SYSTEMS:  All non-GI ROS negative unless otherwise stated in the HPI except for allergies, visual change  Past Medical History:  Diagnosis Date   Anemia    due to kidney   Chronic diastolic heart failure ([place]) 09/08/2019   DVT (deep venous thrombosis) (HCC)    hx   End stage renal disease ([place])    Gallstones 12/2018   Hypertension    Renal disorder    Thyroid disease    graves dx    Past Surgical History:  Procedure Laterality Date   AV FISTULA PLACEMENT Right 04/07/2017   Procedure: ARTERIOVENOUS (AV) FISTULA CREATION;   Surgeon: Elam Dutch, MD;  Location: [facility];  Service: Vascular;  Laterality: Right;   CHOLECYSTECTOMY N/A 12/23/2018   Procedure: LAPAROSCOPIC CHOLECYSTECTOMY;  Surgeon: Erroll Luna, MD;  Location: [facility];  Service: General;  Laterality: N/A;   COLONOSCOPY     DILATION AND CURETTAGE OF UTERUS     EYE SURGERY     cactaracts   LIPOMA EXCISION     LUMBAR EPIDURAL INJECTION  2018    Social History Yvonne Powell  reports that she has never smoked. She has never used smokeless tobacco. She reports that she does not drink alcohol and does not use drugs.  family history includes Breast cancer in her mother and sister; Congestive Heart Failure (age of onset: 43) in her maternal grandmother; Hypertension in her maternal grandmother; Ulcers in her father.  Allergies  Allergen Reactions   Calcitriol Itching, Anxiety, Palpitations, Other (See Comments), Cough and Shortness Of Breath    Had to go to the ED because of an increase dose (mouth and throat itching)   Doxycycline Anaphylaxis   Fish Allergy Anaphylaxis, Shortness Of Breath and Itching   Fish-Derived Products Anaphylaxis, Shortness Of Breath and Itching   Iodine Anaphylaxis, Shortness Of Breath, Itching and Other (See Comments)    NO SEAFOOD!!!!   Penicillin G Anaphylaxis   Shellfish Allergy Anaphylaxis, Itching and Swelling   Shellfish-Derived Products Anaphylaxis and Hives   Clindamycin/Lincomycin Diarrhea   Compazine [Prochlorperazine  Edisylate] Other (See Comments)    Makes her want to tear off her skin   Prochlorperazine     Other reaction(s): Unknown   Sulfa Antibiotics Hives   Penicillins Rash       PHYSICAL EXAMINATION: Vital signs: BP 130/78   Pulse 95   Ht '5\' 4"'$  (1.626 m)   Wt 113 lb 12.8 oz (51.6 kg)   SpO2 97%   BMI 19.53 kg/m   Constitutional: Spry and generally well-appearing, no acute distress Psychiatric: alert and oriented x3, cooperative Eyes: extraocular movements intact, anicteric, conjunctiva  pink Mouth: oral pharynx moist, no lesions Neck: supple no lymphadenopathy Cardiovascular: heart regular rate and rhythm, no murmur Lungs: clear to auscultation bilaterally Abdomen: soft, nontender, nondistended, no obvious ascites, no peritoneal signs, normal bowel sounds, no organomegaly Rectal: Omitted Extremities: no clubbing, cyanosis, or lower extremity edema bilaterally.  AV graft right arm Skin: no lesions on visible extremities Neuro: No focal deficits.  Cranial nerves intact  ASSESSMENT:  1.  Episode of what sounds like transient food impaction.  Negative chest CT.  May have some residual inflammation. 2.  Multiple significant medical problems and advanced age   PLAN:  1.  Cancel modified barium swallow 2.  Schedule barium swallow with tablet 3.  Pepcid 40 mg twice daily for 1 month 4.  Follow-up after the above to be determined A total time of 45 minutes was spent preparing to see the patient, reviewing and.  Of outside evaluations, laboratories, and x-rays.  Obtaining comprehensive history, performing medically appropriate physical examination, ordering medication and radiology study, counseling and educating the patient regarding the above listed issues, and documenting clinical information in the health record.

## 2022-02-07 NOTE — Patient Instructions (Signed)
_______________________________________________________  If you are age 86 or older, your body mass index should be between 23-30. Your Body mass index is 19.53 kg/m. If this is out of the aforementioned range listed, please consider follow up with your Primary Care Provider.  If you are age 2 or younger, your body mass index should be between 19-25. Your Body mass index is 19.53 kg/m. If this is out of the aformentioned range listed, please consider follow up with your Primary Care Provider.   ________________________________________________________  The [facility] providers would like to encourage you to use [facility] to communicate with providers for non-urgent requests or questions.  Due to long hold times on the telephone, sending your provider a message by [facility] may be a faster and more efficient way to get a response.  Please allow 48 business hours for a response.  Please remember that this is for non-urgent requests.  _______________________________________________________  We have sent the following medications to your pharmacy for you to pick up at your convenience:  Peptic - twice a day for one month  You have been scheduled for a Barium Esophogram at [facility] (1st floor of the hospital) on 02/12/2022 at 11:00am. Please arrive 15 minutes prior to your appointment for registration. Make certain not to have anything to eat or drink 3 hours prior to your test. If you need to reschedule for any reason, please contact radiology at [phone] to do so. __________________________________________________________________ A barium swallow is an examination that concentrates on views of the esophagus. This tends to be a double contrast exam (barium and two liquids which, when combined, create a gas to distend the wall of the oesophagus) or single contrast (non-ionic iodine based). The study is usually tailored to your symptoms so a good history is essential. Attention is paid during  the study to the form, structure and configuration of the esophagus, looking for functional disorders (such as aspiration, dysphagia, achalasia, motility and reflux) EXAMINATION You may be asked to change into a gown, depending on the type of swallow being performed. A radiologist and radiographer will perform the procedure. The radiologist will advise you of the type of contrast selected for your procedure and direct you during the exam. You will be asked to stand, sit or lie in several different positions and to hold a small amount of fluid in your mouth before being asked to swallow while the imaging is performed .In some instances you may be asked to swallow barium coated marshmallows to assess the motility of a solid food bolus. The exam can be recorded as a digital or video fluoroscopy procedure. POST PROCEDURE It will take 1-2 days for the barium to pass through your system. To facilitate this, it is important, unless otherwise directed, to increase your fluids for the next 24-48hrs and to resume your normal diet.  This test typically takes about 30 minutes to perform. ________________________________________________________

## 2022-02-08 ENCOUNTER — Emergency Department (HOSPITAL_COMMUNITY)
Admission: EM | Admit: 2022-02-08 | Discharge: 2022-02-09 | Disposition: A | Discharge status: Home / Self Care | Payer: Medicare Other | Attending: Emergency Medicine | Admitting: Emergency Medicine

## 2022-02-08 ENCOUNTER — Encounter (HOSPITAL_COMMUNITY): Payer: Self-pay | Admitting: Emergency Medicine

## 2022-02-08 DIAGNOSIS — I5030 Unspecified diastolic (congestive) heart failure: Secondary | ICD-10-CM | POA: Diagnosis not present

## 2022-02-08 DIAGNOSIS — Z992 Dependence on renal dialysis: Secondary | ICD-10-CM | POA: Insufficient documentation

## 2022-02-08 DIAGNOSIS — R918 Other nonspecific abnormal finding of lung field: Secondary | ICD-10-CM | POA: Diagnosis not present

## 2022-02-08 DIAGNOSIS — N186 End stage renal disease: Secondary | ICD-10-CM | POA: Insufficient documentation

## 2022-02-08 DIAGNOSIS — R1310 Dysphagia, unspecified: Secondary | ICD-10-CM | POA: Insufficient documentation

## 2022-02-08 DIAGNOSIS — I132 Hypertensive heart and chronic kidney disease with heart failure and with stage 5 chronic kidney disease, or end stage renal disease: Secondary | ICD-10-CM | POA: Diagnosis not present

## 2022-02-08 DIAGNOSIS — D631 Anemia in chronic kidney disease: Secondary | ICD-10-CM | POA: Diagnosis not present

## 2022-02-08 DIAGNOSIS — N2581 Secondary hyperparathyroidism of renal origin: Secondary | ICD-10-CM | POA: Diagnosis not present

## 2022-02-08 DIAGNOSIS — I10 Essential (primary) hypertension: Secondary | ICD-10-CM | POA: Diagnosis not present

## 2022-02-08 NOTE — ED Triage Notes (Signed)
Pt brought to ED with c/o dysphagia after taking famotidine and feeling that pill was stuck in her throat. Has hx of the same.    EMS Vitals BP 152/80 HR 63 SPO2 94% RA

## 2022-02-09 ENCOUNTER — Emergency Department (HOSPITAL_COMMUNITY): Payer: Medicare Other

## 2022-02-09 DIAGNOSIS — R1310 Dysphagia, unspecified: Secondary | ICD-10-CM | POA: Diagnosis not present

## 2022-02-09 DIAGNOSIS — R918 Other nonspecific abnormal finding of lung field: Secondary | ICD-10-CM | POA: Diagnosis not present

## 2022-02-09 MED ORDER — LIDOCAINE VISCOUS HCL 2 % MT SOLN
15.0000 mL | Freq: Once | OROMUCOSAL | Status: AC
  Administered 2022-02-09: 15 mL via ORAL
  Filled 2022-02-09: qty 15

## 2022-02-09 MED ORDER — ALUM & MAG HYDROXIDE-SIMETH 200-200-20 MG/5ML PO SUSP
30.0000 mL | Freq: Once | ORAL | Status: AC
  Administered 2022-02-09: 30 mL via ORAL
  Filled 2022-02-09: qty 30

## 2022-02-09 NOTE — ED Notes (Signed)
Pt did not drink lidocaine suspension. Will notify provider of the same.

## 2022-02-09 NOTE — ED Notes (Signed)
Pt noted to take significant amount of time to drink Maalox. Pt current refusing to drink lidocaine stating "I just don't think I can drink it and I don't need anything to numb my throat". This RN tried to encourage pt to take medication to see if it will yield relief in symptoms. Pt then placed head in her hands and appeared to be overwhelmed. Lidocaine suspension left with pt to see if she will be able to tolerate consumption via slow advancement.

## 2022-02-09 NOTE — ED Provider Triage Note (Signed)
Emergency Medicine Provider Triage Evaluation Note  Yvonne Powell , a 88 y.o. female  was evaluated in triage.  Pt complains of globus sensation. Symptoms feel similar to when she was seen 1 week ago. Followed with GI yesterday who started her on '40mg'$  Pepcid. She feels like this is "too strong". Took increased Pepcid dose around 1900; about 20 minutes later, felt a sensation of something "stuck" in her throat. She continues to swallow. No drooling, SOB, vomiting, oral edema. Scheduled for swallow study on Wednesday.  Review of Systems  Positive: As above Negative: As above  Physical Exam  BP (!) 191/91 (BP Location: Left Arm)   Pulse 86   Temp 98 F (36.7 C) (Oral)   Resp 16   SpO2 99%  Gen:   Awake, no distress   Resp:  Normal effort  MSK:   Moves extremities without difficulty  Other:  Anxious appearing. Lungs CTAB. No stridor. Tolerating secretions w/o difficulty. No tripoding. Normal phonation.  Medical Decision Making  Medically screening exam initiated at 12:29 AM.  Appropriate orders placed.  Larina Lieurance was informed that the remainder of the evaluation will be completed by another provider, this initial triage assessment does not replace that evaluation, and the importance of remaining in the ED until their evaluation is complete.  Globus sensation - will give GI cocktail pending formal evaluation. Screening Xray to evaluate for radiopaque FB, exclude pneumomediastinum.   Antonietta Breach, PA-C 02/09/22 [phone]

## 2022-02-09 NOTE — Discharge Instructions (Addendum)
You were seen in the emergency department today after possibly having an allergic reaction to your Pepcid.  You likely did not have an allergic reaction to Pepcid.  You do have difficulty with swallowing which is likely what happened yesterday evening.  Please continue to eat softened foods and liquids that you are comfortable with until you are further evaluated by gastroenterology.  Please go to your barium swallow appointment on 32/67/1245 as this may elicit some answers to your difficulty swallowing.  Please return to the emergency department for worsening symptoms.

## 2022-02-09 NOTE — ED Provider Notes (Signed)
[facility] EMERGENCY DEPARTMENT Provider Note   CSN: [id number] Arrival date & time: 02/08/22  2154     History  Chief Complaint  Patient presents with   Dysphagia    Yvonne Powell is a 88 y.o. female.  Past medical history of hypertension, end-stage renal disease, DVT, diastolic heart failure who presents to the emergency department with dysphagia.  States yesterday evening she took her prescribed famotidine.  She states that about 20 minutes afterwards she began having a warm sensation come over her and also felt as if her throat was closing.  She states that she took Benadryl but this did not help so called EMS.  On arrival here patient in no acute distress.  She was given GI cocktail which she states improved her symptoms.  States she has been eating soft foods because she is afraid to eat solids.  Denies rash, nausea, vomiting or abdominal pain.  Denies chest pain or shortness of breath.  On my exam, almost 10 hours after arrival, patient is not having any symptoms at this time.  On chart review it appears that she was seen on 02/01/2022 for globus sensation.  She had a CT chest performed which showed that she had a mildly dilated esophagus which is likely causing dysmotility.  She was then seen on 02/07/2022 by Dr. Henrene Pastor with [facility] gastroenterology for dysphagia. They ordered barium swallow for 02/12/22 and placed on famotidine.   HPI     Home Medications Prior to Admission medications   Medication Sig Start Date End Date Taking? Authorizing Provider  acetaminophen (TYLENOL) 325 MG tablet Take 325 mg by mouth every 6 (six) hours as needed for mild pain (or headaches). Patient not taking: Reported on 02/07/2022    [provider]  calcium acetate (PHOSLO) 667 MG capsule Take 1,334 mg by mouth 3 (three) times daily with meals. 03/01/20   [provider]  famotidine (PEPCID) 40 MG tablet Take 1 tablet (40 mg total) by mouth 2 (two) times daily.  02/07/22   Irene Shipper, MD  Iron-FA-DSS-B Cmplx-Vit C (NEPHRON FA) TABS Take 1 tablet by mouth daily before supper. 02/23/20   [provider]  levothyroxine (SYNTHROID, LEVOTHROID) 88 MCG tablet Take 88 mcg by mouth daily before breakfast.    [provider]  lidocaine-prilocaine (EMLA) cream Apply 1 application topically Every Tuesday,Thursday,and Saturday with dialysis. Patient not taking: Reported on 02/07/2022 08/22/20   [provider]  Methoxy PEG-Epoetin Beta (MIRCERA IJ) Mircera Patient not taking: Reported on 02/07/2022 10/24/21 10/23/22  [provider]      Allergies    Calcitriol, Doxycycline, Fish allergy, Fish-derived products, Iodine, Penicillin g, Shellfish allergy, Shellfish-derived products, Clindamycin/lincomycin, Compazine [prochlorperazine edisylate], Prochlorperazine, Sulfa antibiotics, and Penicillins    Review of Systems   Review of Systems  HENT:  Positive for trouble swallowing.   All other systems reviewed and are negative.   Physical Exam Updated Vital Signs BP (!) 140/128 (BP Location: Left Arm)   Pulse 80   Temp 97.7 F (36.5 C) (Oral)   Resp 17   SpO2 100%  Physical Exam Vitals and nursing note reviewed.  Constitutional:      General: She is not in acute distress.    Appearance: Normal appearance. She is normal weight. She is not ill-appearing or toxic-appearing.  HENT:     Head: Normocephalic and atraumatic.     Nose: Nose normal.     Mouth/Throat:     Mouth: Mucous membranes are  moist.     Pharynx: Oropharynx is clear. Uvula midline. No posterior oropharyngeal erythema or uvula swelling.     Tonsils: 0 on the right. 0 on the left.  Eyes:     General: No scleral icterus.    Extraocular Movements: Extraocular movements intact.     Pupils: Pupils are equal, round, and reactive to light.  Cardiovascular:     Pulses: Normal pulses.     Heart sounds: Normal heart sounds. No murmur heard. Pulmonary:     Effort:  Pulmonary effort is normal. No respiratory distress.     Breath sounds: Normal breath sounds. No stridor. No wheezing, rhonchi or rales.  Abdominal:     General: Bowel sounds are normal. There is no distension.     Palpations: Abdomen is soft.     Tenderness: There is no abdominal tenderness.  Musculoskeletal:     Cervical back: Neck supple.  Lymphadenopathy:     Cervical: No cervical adenopathy.  Skin:    General: Skin is warm and dry.     Capillary Refill: Capillary refill takes less than 2 seconds.     Findings: No rash.  Neurological:     General: No focal deficit present.     Mental Status: She is alert and oriented to person, place, and time. Mental status is at baseline.  Psychiatric:        Mood and Affect: Mood normal.        Behavior: Behavior normal.        Thought Content: Thought content normal.        Judgment: Judgment normal.    ED Results / Procedures / Treatments   Labs (all labs ordered are listed, but only abnormal results are displayed) Labs Reviewed - No data to display  EKG None  Radiology DG Chest 1 View  Result Date: 02/09/2022 CLINICAL DATA:  Possible impacted food bolus in the chest or throat. EXAM: CHEST  1 VIEW COMPARISON:  Chest CT no contrast 02/01/2022 FINDINGS: The heart size and mediastinal contours are stable with normal cardiac size, with aortic atherosclerosis and tortuosity. Both lungs are hyperinflated but clear. The visualized skeletal structures are osteopenic, with degenerative changes of the spine. IMPRESSION: No acute chest process.  Stable hyperinflated chest. Electronically Signed   By: Telford Nab M.D.   On: 02/09/2022 02:30    Procedures Procedures   Medications Ordered in ED Medications  alum & mag hydroxide-simeth (MAALOX/MYLANTA) 200-200-20 MG/5ML suspension 30 mL (30 mLs Oral Given 02/09/22 0056)    And  lidocaine (XYLOCAINE) 2 % viscous mouth solution 15 mL (15 mLs Oral Given 02/09/22 0056)    ED Course/ Medical  Decision Making/ A&P                           Medical Decision Making  This patient presents to the ED with chief complaint(s) of dysphagia with pertinent past medical history of end-stage renal disease, hypertension, diastolic heart failure which further complicates the presenting complaint. The complaint involves an extensive differential diagnosis and also carries with it a high risk of complications and morbidity.    The differential diagnosis includes esophagitis, globus sensation, dysphagia with possible food impaction, stuck pill   Additional history obtained: Additional history obtained from family Records reviewed Care Everywhere/External Records and [facility] physician note  ED Course and Reassessment: 88 year old female who presents to the emergency department with sensation of her throat closing after swallowing  her famotidine.  She also felt like she was possibly having allergic reaction to this.  On physical exam there are no acute findings.  Her oropharynx is clear without erythema, tonsillar swelling, stridor, trismus.  There is no respiratory distress or difficulty breathing or wheezing.  The neck is supple.  Chest x-ray was obtained earlier this morning in triage which showed no acute findings. She was given a GI cocktail with improvement in symptoms.  Do not feel that she warrants further work-up at this time.  Doubt she had allergic reaction to famotidine.  She is currently asymptomatic.  She has recent history of dysphagia and globus sensation.  She has already been evaluated by [facility] and has a barium swallow scheduled for 02/12/2022.  I discussed at the bedside whether this is the appropriate test and should make every effort to make this appointment on Wednesday and she and son verbalized understanding.  Discussed her continuing on soft foods until she has further swallowing evaluation by GI.  She is tolerating liquids without difficulty.  Feel  this is all likely related to her esophageal dilation and dysmotility.  Doubt allergic reaction, acute coronary syndrome, food bolus or throat foreign body.  We will discharge with GI follow-up.  She verbalized understanding.  Given return precautions for any worsening symptoms.  She was also evaluated by Dr. Maylon Peppers, ED attending.  Feel that she is safe for discharge.  Independent labs interpretation:  The following labs were independently interpreted: Not indicated  Independent visualization of imaging: - I independently visualized the following imaging with scope of interpretation limited to determining acute life threatening conditions related to emergency care: Chest x-ray, which revealed no acute findings  Consultation: - Consulted or discussed management/test interpretation w/ external professional: Not indicated  Consideration for admission or further workup: Not indicated Social Determinants of health: None identified Final Clinical Impression(s) / ED Diagnoses Final diagnoses:  Dysphagia, unspecified type    Rx / DC Orders ED Discharge Orders     None         Mickie Hillier, PA-C 02/09/22 [place], Strong, DO 02/09/22 [id number]

## 2022-02-10 ENCOUNTER — Telehealth: Payer: Self-pay | Admitting: Internal Medicine

## 2022-02-10 DIAGNOSIS — N185 Chronic kidney disease, stage 5: Secondary | ICD-10-CM | POA: Diagnosis not present

## 2022-02-10 DIAGNOSIS — D4102 Neoplasm of uncertain behavior of left kidney: Secondary | ICD-10-CM | POA: Diagnosis not present

## 2022-02-10 NOTE — Telephone Encounter (Signed)
Left message for pt to call back  °

## 2022-02-10 NOTE — Telephone Encounter (Signed)
Patient called states she is having problem with famotidine 40 mg that was prescribed to her. Requesting a call back as soon as possible.

## 2022-02-10 NOTE — Telephone Encounter (Signed)
Pt states she started taking the famotidine '40mg'$  and developed a warm sensation all over, she started to panic and felt like her throat was closing up but she was breathing fine. Pt called EMS and went to the ER. They told it was not an allergic reaction but an adverse reaction. She wanted to let Dr. Henrene Pastor know that she has resumed the OTC pepcid and is doing fine with that, Dr. Henrene Pastor notified.

## 2022-02-11 DIAGNOSIS — D631 Anemia in chronic kidney disease: Secondary | ICD-10-CM | POA: Diagnosis not present

## 2022-02-11 DIAGNOSIS — Z992 Dependence on renal dialysis: Secondary | ICD-10-CM | POA: Diagnosis not present

## 2022-02-11 DIAGNOSIS — N2581 Secondary hyperparathyroidism of renal origin: Secondary | ICD-10-CM | POA: Diagnosis not present

## 2022-02-11 DIAGNOSIS — N186 End stage renal disease: Secondary | ICD-10-CM | POA: Diagnosis not present

## 2022-02-12 ENCOUNTER — Ambulatory Visit (HOSPITAL_COMMUNITY)
Admission: RE | Admit: 2022-02-12 | Discharge: 2022-02-12 | Disposition: A | Discharge status: Home / Self Care | Payer: Medicare Other | Source: Ambulatory Visit | Attending: Internal Medicine | Admitting: Internal Medicine

## 2022-02-12 DIAGNOSIS — K219 Gastro-esophageal reflux disease without esophagitis: Secondary | ICD-10-CM | POA: Insufficient documentation

## 2022-02-12 DIAGNOSIS — K449 Diaphragmatic hernia without obstruction or gangrene: Secondary | ICD-10-CM | POA: Insufficient documentation

## 2022-02-12 DIAGNOSIS — R1310 Dysphagia, unspecified: Secondary | ICD-10-CM

## 2022-02-12 DIAGNOSIS — K224 Dyskinesia of esophagus: Secondary | ICD-10-CM | POA: Diagnosis not present

## 2022-02-13 DIAGNOSIS — N186 End stage renal disease: Secondary | ICD-10-CM | POA: Diagnosis not present

## 2022-02-13 DIAGNOSIS — Z992 Dependence on renal dialysis: Secondary | ICD-10-CM | POA: Diagnosis not present

## 2022-02-13 DIAGNOSIS — N2581 Secondary hyperparathyroidism of renal origin: Secondary | ICD-10-CM | POA: Diagnosis not present

## 2022-02-13 DIAGNOSIS — D631 Anemia in chronic kidney disease: Secondary | ICD-10-CM | POA: Diagnosis not present

## 2022-02-15 DIAGNOSIS — Z992 Dependence on renal dialysis: Secondary | ICD-10-CM | POA: Diagnosis not present

## 2022-02-15 DIAGNOSIS — N2581 Secondary hyperparathyroidism of renal origin: Secondary | ICD-10-CM | POA: Diagnosis not present

## 2022-02-15 DIAGNOSIS — D631 Anemia in chronic kidney disease: Secondary | ICD-10-CM | POA: Diagnosis not present

## 2022-02-15 DIAGNOSIS — N186 End stage renal disease: Secondary | ICD-10-CM | POA: Diagnosis not present

## 2022-02-17 ENCOUNTER — Telehealth: Payer: Self-pay

## 2022-02-17 NOTE — Telephone Encounter (Signed)
     Patient  visit on 10/8  at [facility]   Have you been able to follow up with your primary care physician? yes  The patient was or was not able to obtain any needed medicine or equipment. yes  Are there diet recommendations that you are having difficulty following? na  Patient expresses understanding of discharge instructions and education provided has no other needs at this time. yes     [place], Care Management  [phone] [address], [place], [place] [address] Phone: [phone] Email: [email or web address]

## 2022-02-18 DIAGNOSIS — D631 Anemia in chronic kidney disease: Secondary | ICD-10-CM | POA: Diagnosis not present

## 2022-02-18 DIAGNOSIS — N186 End stage renal disease: Secondary | ICD-10-CM | POA: Diagnosis not present

## 2022-02-18 DIAGNOSIS — Z992 Dependence on renal dialysis: Secondary | ICD-10-CM | POA: Diagnosis not present

## 2022-02-18 DIAGNOSIS — N2581 Secondary hyperparathyroidism of renal origin: Secondary | ICD-10-CM | POA: Diagnosis not present

## 2022-02-20 DIAGNOSIS — Z992 Dependence on renal dialysis: Secondary | ICD-10-CM | POA: Diagnosis not present

## 2022-02-20 DIAGNOSIS — D631 Anemia in chronic kidney disease: Secondary | ICD-10-CM | POA: Diagnosis not present

## 2022-02-20 DIAGNOSIS — N186 End stage renal disease: Secondary | ICD-10-CM | POA: Diagnosis not present

## 2022-02-20 DIAGNOSIS — N2581 Secondary hyperparathyroidism of renal origin: Secondary | ICD-10-CM | POA: Diagnosis not present

## 2022-02-22 DIAGNOSIS — N2581 Secondary hyperparathyroidism of renal origin: Secondary | ICD-10-CM | POA: Diagnosis not present

## 2022-02-22 DIAGNOSIS — N186 End stage renal disease: Secondary | ICD-10-CM | POA: Diagnosis not present

## 2022-02-22 DIAGNOSIS — D631 Anemia in chronic kidney disease: Secondary | ICD-10-CM | POA: Diagnosis not present

## 2022-02-22 DIAGNOSIS — Z992 Dependence on renal dialysis: Secondary | ICD-10-CM | POA: Diagnosis not present

## 2022-02-24 DIAGNOSIS — E039 Hypothyroidism, unspecified: Secondary | ICD-10-CM | POA: Diagnosis not present

## 2022-02-24 DIAGNOSIS — I120 Hypertensive chronic kidney disease with stage 5 chronic kidney disease or end stage renal disease: Secondary | ICD-10-CM | POA: Diagnosis not present

## 2022-02-24 DIAGNOSIS — R1310 Dysphagia, unspecified: Secondary | ICD-10-CM | POA: Diagnosis not present

## 2022-02-24 DIAGNOSIS — K219 Gastro-esophageal reflux disease without esophagitis: Secondary | ICD-10-CM | POA: Diagnosis not present

## 2022-02-24 DIAGNOSIS — Z992 Dependence on renal dialysis: Secondary | ICD-10-CM | POA: Diagnosis not present

## 2022-02-25 DIAGNOSIS — D631 Anemia in chronic kidney disease: Secondary | ICD-10-CM | POA: Diagnosis not present

## 2022-02-25 DIAGNOSIS — N186 End stage renal disease: Secondary | ICD-10-CM | POA: Diagnosis not present

## 2022-02-25 DIAGNOSIS — Z992 Dependence on renal dialysis: Secondary | ICD-10-CM | POA: Diagnosis not present

## 2022-02-25 DIAGNOSIS — N2581 Secondary hyperparathyroidism of renal origin: Secondary | ICD-10-CM | POA: Diagnosis not present

## 2022-02-27 DIAGNOSIS — N186 End stage renal disease: Secondary | ICD-10-CM | POA: Diagnosis not present

## 2022-02-27 DIAGNOSIS — Z992 Dependence on renal dialysis: Secondary | ICD-10-CM | POA: Diagnosis not present

## 2022-02-27 DIAGNOSIS — D631 Anemia in chronic kidney disease: Secondary | ICD-10-CM | POA: Diagnosis not present

## 2022-02-27 DIAGNOSIS — N2581 Secondary hyperparathyroidism of renal origin: Secondary | ICD-10-CM | POA: Diagnosis not present

## 2022-03-01 DIAGNOSIS — N2581 Secondary hyperparathyroidism of renal origin: Secondary | ICD-10-CM | POA: Diagnosis not present

## 2022-03-01 DIAGNOSIS — N186 End stage renal disease: Secondary | ICD-10-CM | POA: Diagnosis not present

## 2022-03-01 DIAGNOSIS — Z992 Dependence on renal dialysis: Secondary | ICD-10-CM | POA: Diagnosis not present

## 2022-03-01 DIAGNOSIS — D631 Anemia in chronic kidney disease: Secondary | ICD-10-CM | POA: Diagnosis not present

## 2022-03-02 ENCOUNTER — Other Ambulatory Visit: Payer: Self-pay | Admitting: Internal Medicine

## 2022-03-04 DIAGNOSIS — N186 End stage renal disease: Secondary | ICD-10-CM | POA: Diagnosis not present

## 2022-03-04 DIAGNOSIS — D631 Anemia in chronic kidney disease: Secondary | ICD-10-CM | POA: Diagnosis not present

## 2022-03-04 DIAGNOSIS — Z992 Dependence on renal dialysis: Secondary | ICD-10-CM | POA: Diagnosis not present

## 2022-03-04 DIAGNOSIS — N2581 Secondary hyperparathyroidism of renal origin: Secondary | ICD-10-CM | POA: Diagnosis not present

## 2022-03-05 DIAGNOSIS — M810 Age-related osteoporosis without current pathological fracture: Secondary | ICD-10-CM | POA: Diagnosis not present

## 2022-03-05 DIAGNOSIS — Z992 Dependence on renal dialysis: Secondary | ICD-10-CM | POA: Diagnosis not present

## 2022-03-05 DIAGNOSIS — N186 End stage renal disease: Secondary | ICD-10-CM | POA: Diagnosis not present

## 2022-03-05 DIAGNOSIS — I129 Hypertensive chronic kidney disease with stage 1 through stage 4 chronic kidney disease, or unspecified chronic kidney disease: Secondary | ICD-10-CM | POA: Diagnosis not present

## 2022-03-05 DIAGNOSIS — I120 Hypertensive chronic kidney disease with stage 5 chronic kidney disease or end stage renal disease: Secondary | ICD-10-CM | POA: Diagnosis not present

## 2022-03-06 DIAGNOSIS — N186 End stage renal disease: Secondary | ICD-10-CM | POA: Diagnosis not present

## 2022-03-06 DIAGNOSIS — D631 Anemia in chronic kidney disease: Secondary | ICD-10-CM | POA: Diagnosis not present

## 2022-03-06 DIAGNOSIS — N2581 Secondary hyperparathyroidism of renal origin: Secondary | ICD-10-CM | POA: Diagnosis not present

## 2022-03-06 DIAGNOSIS — Z992 Dependence on renal dialysis: Secondary | ICD-10-CM | POA: Diagnosis not present

## 2022-03-08 DIAGNOSIS — D631 Anemia in chronic kidney disease: Secondary | ICD-10-CM | POA: Diagnosis not present

## 2022-03-08 DIAGNOSIS — N186 End stage renal disease: Secondary | ICD-10-CM | POA: Diagnosis not present

## 2022-03-08 DIAGNOSIS — Z992 Dependence on renal dialysis: Secondary | ICD-10-CM | POA: Diagnosis not present

## 2022-03-08 DIAGNOSIS — N2581 Secondary hyperparathyroidism of renal origin: Secondary | ICD-10-CM | POA: Diagnosis not present

## 2022-03-11 DIAGNOSIS — Z992 Dependence on renal dialysis: Secondary | ICD-10-CM | POA: Diagnosis not present

## 2022-03-11 DIAGNOSIS — D631 Anemia in chronic kidney disease: Secondary | ICD-10-CM | POA: Diagnosis not present

## 2022-03-11 DIAGNOSIS — N186 End stage renal disease: Secondary | ICD-10-CM | POA: Diagnosis not present

## 2022-03-11 DIAGNOSIS — N2581 Secondary hyperparathyroidism of renal origin: Secondary | ICD-10-CM | POA: Diagnosis not present

## 2022-03-13 DIAGNOSIS — N186 End stage renal disease: Secondary | ICD-10-CM | POA: Diagnosis not present

## 2022-03-13 DIAGNOSIS — D631 Anemia in chronic kidney disease: Secondary | ICD-10-CM | POA: Diagnosis not present

## 2022-03-13 DIAGNOSIS — Z992 Dependence on renal dialysis: Secondary | ICD-10-CM | POA: Diagnosis not present

## 2022-03-13 DIAGNOSIS — N2581 Secondary hyperparathyroidism of renal origin: Secondary | ICD-10-CM | POA: Diagnosis not present

## 2022-03-15 DIAGNOSIS — D631 Anemia in chronic kidney disease: Secondary | ICD-10-CM | POA: Diagnosis not present

## 2022-03-15 DIAGNOSIS — N186 End stage renal disease: Secondary | ICD-10-CM | POA: Diagnosis not present

## 2022-03-15 DIAGNOSIS — N2581 Secondary hyperparathyroidism of renal origin: Secondary | ICD-10-CM | POA: Diagnosis not present

## 2022-03-15 DIAGNOSIS — Z992 Dependence on renal dialysis: Secondary | ICD-10-CM | POA: Diagnosis not present

## 2022-03-18 DIAGNOSIS — N186 End stage renal disease: Secondary | ICD-10-CM | POA: Diagnosis not present

## 2022-03-18 DIAGNOSIS — D631 Anemia in chronic kidney disease: Secondary | ICD-10-CM | POA: Diagnosis not present

## 2022-03-18 DIAGNOSIS — Z992 Dependence on renal dialysis: Secondary | ICD-10-CM | POA: Diagnosis not present

## 2022-03-18 DIAGNOSIS — N2581 Secondary hyperparathyroidism of renal origin: Secondary | ICD-10-CM | POA: Diagnosis not present

## 2022-03-20 DIAGNOSIS — N2581 Secondary hyperparathyroidism of renal origin: Secondary | ICD-10-CM | POA: Diagnosis not present

## 2022-03-20 DIAGNOSIS — Z992 Dependence on renal dialysis: Secondary | ICD-10-CM | POA: Diagnosis not present

## 2022-03-20 DIAGNOSIS — D631 Anemia in chronic kidney disease: Secondary | ICD-10-CM | POA: Diagnosis not present

## 2022-03-20 DIAGNOSIS — N186 End stage renal disease: Secondary | ICD-10-CM | POA: Diagnosis not present

## 2022-03-22 DIAGNOSIS — N2581 Secondary hyperparathyroidism of renal origin: Secondary | ICD-10-CM | POA: Diagnosis not present

## 2022-03-22 DIAGNOSIS — Z992 Dependence on renal dialysis: Secondary | ICD-10-CM | POA: Diagnosis not present

## 2022-03-22 DIAGNOSIS — D631 Anemia in chronic kidney disease: Secondary | ICD-10-CM | POA: Diagnosis not present

## 2022-03-22 DIAGNOSIS — N186 End stage renal disease: Secondary | ICD-10-CM | POA: Diagnosis not present

## 2022-03-24 DIAGNOSIS — N2581 Secondary hyperparathyroidism of renal origin: Secondary | ICD-10-CM | POA: Diagnosis not present

## 2022-03-24 DIAGNOSIS — N186 End stage renal disease: Secondary | ICD-10-CM | POA: Diagnosis not present

## 2022-03-24 DIAGNOSIS — Z992 Dependence on renal dialysis: Secondary | ICD-10-CM | POA: Diagnosis not present

## 2022-03-24 DIAGNOSIS — D631 Anemia in chronic kidney disease: Secondary | ICD-10-CM | POA: Diagnosis not present

## 2022-03-26 DIAGNOSIS — Z992 Dependence on renal dialysis: Secondary | ICD-10-CM | POA: Diagnosis not present

## 2022-03-26 DIAGNOSIS — D631 Anemia in chronic kidney disease: Secondary | ICD-10-CM | POA: Diagnosis not present

## 2022-03-26 DIAGNOSIS — N186 End stage renal disease: Secondary | ICD-10-CM | POA: Diagnosis not present

## 2022-03-26 DIAGNOSIS — N2581 Secondary hyperparathyroidism of renal origin: Secondary | ICD-10-CM | POA: Diagnosis not present

## 2022-03-29 DIAGNOSIS — D631 Anemia in chronic kidney disease: Secondary | ICD-10-CM | POA: Diagnosis not present

## 2022-03-29 DIAGNOSIS — Z992 Dependence on renal dialysis: Secondary | ICD-10-CM | POA: Diagnosis not present

## 2022-03-29 DIAGNOSIS — N186 End stage renal disease: Secondary | ICD-10-CM | POA: Diagnosis not present

## 2022-03-29 DIAGNOSIS — N2581 Secondary hyperparathyroidism of renal origin: Secondary | ICD-10-CM | POA: Diagnosis not present

## 2022-04-01 DIAGNOSIS — N2581 Secondary hyperparathyroidism of renal origin: Secondary | ICD-10-CM | POA: Diagnosis not present

## 2022-04-01 DIAGNOSIS — D631 Anemia in chronic kidney disease: Secondary | ICD-10-CM | POA: Diagnosis not present

## 2022-04-01 DIAGNOSIS — Z992 Dependence on renal dialysis: Secondary | ICD-10-CM | POA: Diagnosis not present

## 2022-04-01 DIAGNOSIS — N186 End stage renal disease: Secondary | ICD-10-CM | POA: Diagnosis not present

## 2022-04-02 ENCOUNTER — Encounter: Payer: Self-pay | Admitting: Internal Medicine

## 2022-04-02 ENCOUNTER — Ambulatory Visit (INDEPENDENT_AMBULATORY_CARE_PROVIDER_SITE_OTHER): Payer: Medicare Other | Admitting: Internal Medicine

## 2022-04-02 VITALS — BP 144/70 | HR 50 | Ht 64.0 in | Wt 117.0 lb

## 2022-04-02 DIAGNOSIS — R1310 Dysphagia, unspecified: Secondary | ICD-10-CM | POA: Diagnosis not present

## 2022-04-02 DIAGNOSIS — K224 Dyskinesia of esophagus: Secondary | ICD-10-CM

## 2022-04-02 NOTE — Progress Notes (Signed)
HISTORY OF PRESENT ILLNESS:  Yvonne Powell is a delightful 88 y.o. female, mother of Yvonne Powell, with multiple medical problems including end-stage renal disease on hemodialysis who was evaluated in this office February 07, 2022 regarding an isolated episode of transient food impaction.  See that dictation.  Negative chest CT.  Subsequently underwent barium swallow with tablet.  This showed probable dysmotility with transient delay in passage of tablet at the level of T3.  Patient was placed on Pepcid 40 mg twice daily.  This disagreed with her.  She actually went to the emergency room for evaluation.  Nothing serious.  She presents today for follow-up as requested.  The patient tells me she is doing much better since her last visit.  No further issues with chest discomfort.  Swallowing food without issues.  She tells me that she does have some trouble with her large vitamin and calcium supplement.  Otherwise well.  Please.  No new complaints.  REVIEW OF SYSTEMS:  All non-GI ROS negative.  Past Medical History:  Diagnosis Date   Anemia    due to kidney   Chronic diastolic heart failure ([facility]) 09/08/2019   DVT (deep venous thrombosis) ([facility])    hx   End stage renal disease ([place])    Gallstones 12/2018   GERD (gastroesophageal reflux disease)    Hypertension    Renal disorder    Thyroid disease    graves dx    Past Surgical History:  Procedure Laterality Date   AV FISTULA PLACEMENT Right 04/07/2017   Procedure: ARTERIOVENOUS (AV) FISTULA CREATION;  Surgeon: Elam Dutch, MD;  Location: [place];  Service: Vascular;  Laterality: Right;   CHOLECYSTECTOMY N/A 12/23/2018   Procedure: LAPAROSCOPIC CHOLECYSTECTOMY;  Surgeon: Erroll Luna, MD;  Location: [facility];  Service: General;  Laterality: N/A;   COLONOSCOPY     DILATION AND CURETTAGE OF UTERUS     EYE SURGERY     cactaracts   LIPOMA EXCISION     LUMBAR EPIDURAL INJECTION  2018    Social History Yvonne Powell  reports that she  has never smoked. She has never used smokeless tobacco. She reports that she does not drink alcohol and does not use drugs.  family history includes Breast cancer in her mother and sister; Congestive Heart Failure (age of onset: 89) in her maternal grandmother; Hypertension in her maternal grandmother; Ulcers in her father.  Allergies  Allergen Reactions   Calcitriol Itching, Anxiety, Palpitations, Other (See Comments), Cough and Shortness Of Breath    Had to go to the ED because of an increase dose (mouth and throat itching)   Doxycycline Anaphylaxis   Fish Allergy Anaphylaxis, Shortness Of Breath and Itching   Fish-Derived Products Anaphylaxis, Shortness Of Breath and Itching   Iodine Anaphylaxis, Shortness Of Breath, Itching and Other (See Comments)    NO SEAFOOD!!!!   Penicillin G Anaphylaxis   Shellfish Allergy Anaphylaxis, Itching and Swelling   Shellfish-Derived Products Anaphylaxis and Hives   Clindamycin/Lincomycin Diarrhea   Compazine [Prochlorperazine Edisylate] Other (See Comments)    Makes her want to tear off her skin   Prochlorperazine     Other reaction(s): Unknown   Sulfa Antibiotics Hives   Penicillins Rash       PHYSICAL EXAMINATION: Vital signs: BP (!) 144/70   Pulse (!) 50   Ht '5\' 4"'$  (1.626 m)   Wt 117 lb (53.1 kg)   BMI 20.08 kg/m  General: Well-developed, well-nourished, no acute distress HEENT: Sclerae anicteric  Abdomen:  Not reexamined Psychiatric: alert and oriented x3. Cooperative   ASSESSMENT:  1.  Prior episode of isolated, what sounds like, transient food impaction.  Resolved without recurrence..  Still reports some dysphagia in the cervical region to large pills. 2.  Barium esophagram suggesting dysmotility.  Otherwise unremarkable. 3.  General medical problems.  Stable   PLAN:  1.  We discussed the results of her esophagram.  We discussed esophageal dysmotility.  We discussed alternatives to larger pills that are problematic.  She  understands. 2.  Resume care with her primary care provider.  GI follow-up as needed.

## 2022-04-02 NOTE — Patient Instructions (Signed)
_______________________________________________________  If you are age 86 or older, your body mass index should be between 23-30. Your Body mass index is 20.08 kg/m. If this is out of the aforementioned range listed, please consider follow up with your Primary Care Provider.  If you are age 36 or younger, your body mass index should be between 19-25. Your Body mass index is 20.08 kg/m. If this is out of the aformentioned range listed, please consider follow up with your Primary Care Provider.   ________________________________________________________  The [facility] providers would like to encourage you to use [facility] to communicate with providers for non-urgent requests or questions.  Due to long hold times on the telephone, sending your provider a message by [facility] may be a faster and more efficient way to get a response.  Please allow 48 business hours for a response.  Please remember that this is for non-urgent requests.  _______________________________________________________  It was a pleasure to see you today!  Thank you for trusting me with your gastrointestinal care!

## 2022-04-03 DIAGNOSIS — N186 End stage renal disease: Secondary | ICD-10-CM | POA: Diagnosis not present

## 2022-04-03 DIAGNOSIS — Z992 Dependence on renal dialysis: Secondary | ICD-10-CM | POA: Diagnosis not present

## 2022-04-03 DIAGNOSIS — N2581 Secondary hyperparathyroidism of renal origin: Secondary | ICD-10-CM | POA: Diagnosis not present

## 2022-04-03 DIAGNOSIS — D631 Anemia in chronic kidney disease: Secondary | ICD-10-CM | POA: Diagnosis not present

## 2022-04-04 DIAGNOSIS — I129 Hypertensive chronic kidney disease with stage 1 through stage 4 chronic kidney disease, or unspecified chronic kidney disease: Secondary | ICD-10-CM | POA: Diagnosis not present

## 2022-04-04 DIAGNOSIS — N186 End stage renal disease: Secondary | ICD-10-CM | POA: Diagnosis not present

## 2022-04-04 DIAGNOSIS — Z992 Dependence on renal dialysis: Secondary | ICD-10-CM | POA: Diagnosis not present

## 2022-04-05 DIAGNOSIS — D631 Anemia in chronic kidney disease: Secondary | ICD-10-CM | POA: Diagnosis not present

## 2022-04-05 DIAGNOSIS — N186 End stage renal disease: Secondary | ICD-10-CM | POA: Diagnosis not present

## 2022-04-05 DIAGNOSIS — N2581 Secondary hyperparathyroidism of renal origin: Secondary | ICD-10-CM | POA: Diagnosis not present

## 2022-04-05 DIAGNOSIS — Z992 Dependence on renal dialysis: Secondary | ICD-10-CM | POA: Diagnosis not present

## 2022-04-07 ENCOUNTER — Ambulatory Visit (INDEPENDENT_AMBULATORY_CARE_PROVIDER_SITE_OTHER): Payer: Medicare Other | Admitting: Cardiovascular Disease

## 2022-04-07 ENCOUNTER — Encounter (HOSPITAL_BASED_OUTPATIENT_CLINIC_OR_DEPARTMENT_OTHER): Payer: Self-pay | Admitting: Cardiovascular Disease

## 2022-04-07 VITALS — BP 186/78 | HR 86 | Ht 64.0 in | Wt 116.6 lb

## 2022-04-07 DIAGNOSIS — I151 Hypertension secondary to other renal disorders: Secondary | ICD-10-CM | POA: Diagnosis not present

## 2022-04-07 DIAGNOSIS — I5032 Chronic diastolic (congestive) heart failure: Secondary | ICD-10-CM

## 2022-04-07 MED ORDER — HYDRALAZINE HCL 25 MG PO TABS: 25.0000 mg | ORAL_TABLET | Freq: Every day | ORAL | 3 refills | Status: DC | PRN

## 2022-04-07 NOTE — Assessment & Plan Note (Signed)
Blood pressure is unusually high today.  She has HD on Tuesday, Thursday, and Saturday.  In general her blood pressures continue to be labile but she has been able to get through her sessions and have been reasonable.  She does not usually check her blood pressure on not on-HD days.  She is going to start checking it twice per day.  We will give her hydralazine 25 mg to take as needed for sustained blood pressure greater than 160, which she will only use on non-HD days.  She will check her blood pressures at home and call us with an update later in the week.

## 2022-04-07 NOTE — Progress Notes (Signed)
Cardiology Office Note   Date:  04/24/2022   ID:  Yvonne Powell, DOB 1929-02-14, MRN [id number]  PCP:  Velna Hatchet, MD  Cardiologist:  Skeet Latch, MD  No chief complaint on file.  History of Present Illness: Yvonne Powell is a 88 y.o. female with hypertension, hyperlipidemia,  chronic diastolic heart failure, prior DVT, ESRD on HD, and hypothyroidism who presents for follow-up.  She was admitted with acute on chronic shortness of breath and edema 09/2019.  Echo at that time revealed LVEF 50 to 55% with grade 1 diastolic dysfunction and a small pericardial effusion.  High-sensitivity troponin was elevated but thought to be due to demand ischemia.  She was diuresed with IV Lasix and discharged on oral Lasix.  She has an AV fistula in place and follows with nephrology.  She was started on hemodialysis and her BP has been better controlled. She was seen in ED on 09/20/20 for allergic reaction to calcitriol. She experiences throat tightness but exhibited no symptoms of anaphylactic shock. She has followed up with [name] several times since discharge.  She notes that she is tired after her HD sessions. She saw [name], NP on 08/2020 for occasional low blood pressure with dialysis.   At the last visit, she was still struggling of fatigue from dialysis but was doing well. She last saw [name], NP 11/21/21 for palpitations and dyspnea.They've been working together on her dry weight on dialysis and she thought it was due to her palpitations.  She has been doing well overall on a cardiac standpoint. During her dialysis sessions, her blood pressure ranges around 431V systolic but can peak up to 400-867Y systolic.  She has been able to complete her full dialysis session.  She notes that her blood pressure numbers fluctuate. She doesn't check her blood pressure on her non-dialysis days. She was experiencing dysphagia, which she was admitted in the ED for. She felt that she couldn't  swallow her medication capsules or food at the time. It was affecting her eating intake for 3 weeks. It improved with treatment for GERD.  She has been eating three meals a day currently.     Past Medical History:  Diagnosis Date   Anemia    due to kidney   Chronic diastolic heart failure ([place]) 09/08/2019   DVT (deep venous thrombosis) (HCC)    hx   End stage renal disease ([place])    Gallstones 12/2018   GERD (gastroesophageal reflux disease)    Hypertension    Renal disorder    Thyroid disease    graves dx    Past Surgical History:  Procedure Laterality Date   AV FISTULA PLACEMENT Right 04/07/2017   Procedure: ARTERIOVENOUS (AV) FISTULA CREATION;  Surgeon: Elam Dutch, MD;  Location: [facility];  Service: Vascular;  Laterality: Right;   CHOLECYSTECTOMY N/A 12/23/2018   Procedure: LAPAROSCOPIC CHOLECYSTECTOMY;  Surgeon: Erroll Luna, MD;  Location: [place];  Service: General;  Laterality: N/A;   COLONOSCOPY     DILATION AND CURETTAGE OF UTERUS     EYE SURGERY     cactaracts   LIPOMA EXCISION     LUMBAR EPIDURAL INJECTION  2018     Current Outpatient Medications  Medication Sig Dispense Refill   hydrALAZINE (APRESOLINE) 25 MG tablet Take 1 tablet (25 mg total) by mouth daily as needed. ON NON DIALYSIS DAYS 90 tablet 3   levothyroxine (SYNTHROID, LEVOTHROID) 88 MCG tablet Take 88 mcg by mouth daily before breakfast.  acetaminophen (TYLENOL) 325 MG tablet Take 325 mg by mouth every 6 (six) hours as needed for mild pain (or headaches). (Patient not taking: Reported on 04/07/2022)     calcium acetate (PHOSLO) 667 MG capsule Take 1,334 mg by mouth 3 (three) times daily with meals. (Patient not taking: Reported on 04/07/2022)     famotidine (PEPCID) 40 MG tablet TAKE 1 TABLET BY MOUTH TWICE A DAY (Patient not taking: Reported on 04/07/2022) 180 tablet 1   Iron-FA-DSS-B Cmplx-Vit C (NEPHRON FA) TABS Take 1 tablet by mouth daily before supper. (Patient not taking: Reported on 04/07/2022)      lidocaine-prilocaine (EMLA) cream Apply 1 application  topically Every Tuesday,Thursday,and Saturday with dialysis. (Patient not taking: Reported on 04/07/2022)     Methoxy PEG-Epoetin Beta (MIRCERA IJ)  (Patient not taking: Reported on 04/07/2022)     No current facility-administered medications for this visit.    Allergies:   Calcitriol, Doxycycline, Fish allergy, Fish-derived products, Iodine, Penicillin g, Shellfish allergy, Shellfish-derived products, Clindamycin/lincomycin, Compazine [prochlorperazine edisylate], Prochlorperazine, Sulfa antibiotics, and Penicillins    Social History:  The patient  reports that she has never smoked. She has never used smokeless tobacco. She reports that she does not drink alcohol and does not use drugs.   Family History:  The patient's family history includes Breast cancer in her mother and sister; Congestive Heart Failure (age of onset: 21) in her maternal grandmother; Hypertension in her maternal grandmother; Ulcers in her father.    ROS:  Please see the history of present illness.    All other systems are reviewed and negative.   PHYSICAL EXAM: VS:  BP (!) 186/78 (BP Location: Left Arm, Patient Position: Sitting, Cuff Size: Normal)   Pulse 86   Ht '5\' 4"'$  (1.626 m)   Wt 116 lb 9.6 oz (52.9 kg)   SpO2 94%   BMI 20.01 kg/m  , BMI Body mass index is 20.01 kg/m. GENERAL:  Well appearing HEENT:  Pupils equal round and reactive, fundi not visualized, oral mucosa unremarkable NECK:  No jugular venous distention, waveform within normal limits, carotid upstroke brisk and symmetric, no bruits, no thyromegaly LUNGS:  Clear to auscultation bilaterally HEART:  RRR.  PMI not displaced or sustained, S1 and S2 within normal limits, no S3, no S4, no clicks, no rubs, III/VI systolic murmurs ABD:  Flat, positive bowel sounds normal in frequency in pitch, no bruits, no rebound, no guarding, no midline pulsatile mass, no hepatomegaly, no splenomegaly EXT:  2  plus pulses throughout, no edema, no cyanosis no clubbing. R AC fistula with bruit SKIN:  No rashes no nodules NEURO:  Cranial nerves II through XII grossly intact, motor grossly intact throughout PSYCH:  Cognitively intact, oriented to person place and time  EKG: EKG is personally reviewed. 04/07/22: EKG was not ordered. 01/30/22: Sinus rhythm. Rate 82 bpm. First degree AV block. PVCs. 09/20/20 (ED): Sinus rhythm, Rate 83 bpm, Prolonged PR interval 03/06/21: Sinus rhythm, rate 85 bpm, prior septal infarct  Chest X-ray 02/09/22: FINDINGS: The heart size and mediastinal contours are stable with normal cardiac size, with aortic atherosclerosis and tortuosity. Both lungs are hyperinflated but clear. The visualized skeletal structures are osteopenic, with degenerative changes of the spine.   IMPRESSION: No acute chest process.  Stable hyperinflated chest.  Echo 09/2019:  1. Left ventricular ejection fraction, by estimation, is 50 to 55%. The  left ventricle has low normal function. Septal-lateral dyssynchrony  consistent with LBBB. There is moderate left ventricular hypertrophy. Left  ventricular diastolic parameters are  consistent with Grade I diastolic dysfunction (impaired relaxation).   2. Right ventricular systolic function is normal. The right ventricular  size is normal. Tricuspid regurgitation signal is inadequate for assessing  PA pressure.   3. Left atrial size was moderately dilated.   4. The mitral valve is degenerative. Mild mitral valve regurgitation. No  evidence of mitral stenosis.   5. The aortic valve is tricuspid. Aortic valve regurgitation is not  visualized. Mild aortic valve sclerosis is present, with no evidence of  aortic valve stenosis.   6. The inferior vena cava is dilated in size with <50% respiratory  variability, suggesting right atrial pressure of 15 mmHg.   7. Small circumferential pericardial effusion, no evidence for tamponade.  I do not think that the  dilated IVC is related to the pericardial  effusion.   Recent Labs: 02/01/2022: ALT 10; BUN 36; Creatinine, Ser 6.10; Hemoglobin 10.1; Platelets 178; Potassium 3.7; Sodium 135    Lipid Panel No results found for: "CHOL", "TRIG", "HDL", "CHOLHDL", "VLDL", "LDLCALC", "LDLDIRECT"    Wt Readings from Last 3 Encounters:  04/07/22 116 lb 9.6 oz (52.9 kg)  04/02/22 117 lb (53.1 kg)  02/07/22 113 lb 12.8 oz (51.6 kg)      ASSESSMENT AND PLAN:  Chronic diastolic heart failure ([place]) She is euvolemic.  Volume status is managed with dialysis.  Blood pressures are poorly controlled today but generally have been okay.  Hypertension Blood pressure is unusually high today.  She has HD on Tuesday, Thursday, and Saturday.  In general her blood pressures continue to be labile but she has been able to get through her sessions and have been reasonable.  She does not usually check her blood pressure on not on-HD days.  She is going to start checking it twice per day.  We will give her hydralazine 25 mg to take as needed for sustained blood pressure greater than 160, which she will only use on non-HD days.  She will check her blood pressures at home and call us with an update later in the week.   Current medicines are reviewed at length with the patient today.  The patient does not have concerns regarding medicines.  The following changes have been made:  no change  Labs/ tests ordered today include:  No orders of the defined types were placed in this encounter.   Disposition:   FU with Jaqlyn Gruenhagen C. Oval Linsey, MD, [facility] in 2 months   I,Danny Valdes,acting as a Education administrator for Skeet Latch, MD.,have documented all relevant documentation on the behalf of Skeet Latch, MD,as directed by  Skeet Latch, MD while in the presence of Skeet Latch, MD.   Signed, [place]. Oval Linsey, MD, [facility]  04/24/2022 9:36 AM    [facility]

## 2022-04-07 NOTE — Patient Instructions (Addendum)
Medication Instructions:  TAKE HYDRALAZINE 25 MG AS NEEDED FOR SUSTAINED BLOOD PRESSURE ABOVE 160 ON NON DIALYSIS DAYS   *If you need a refill on your cardiac medications before your next appointment, please call your pharmacy*  Lab Work: NONE  Testing/Procedures: NONE  Follow-Up: At [facility], you and your health needs are our priority.  As part of our continuing mission to provide you with exceptional heart care, we have created designated Provider Care Teams.  These Care Teams include your primary Cardiologist (physician) and Advanced Practice Providers (APPs -  Physician Assistants and Nurse Practitioners) who all work together to provide you with the care you need, when you need it.  We recommend signing up for the patient portal called "MyChart".  Sign up information is provided on this After Visit Summary.  MyChart is used to connect with patients for Virtual Visits (Telemedicine).  Patients are able to view lab/test results, encounter notes, upcoming appointments, etc.  Non-urgent messages can be sent to your provider as well.   To learn more about what you can do with MyChart, go to [email or web address].    Your next appointment:   2 month(s)  The format for your next appointment:   In Person  Provider:   Skeet Latch, MD    Other Instructions  MONITOR YOUR BLOOD PRESSURE DAILY AT HOME, [place]  [phone]

## 2022-04-07 NOTE — Assessment & Plan Note (Signed)
She is euvolemic.  Volume status is managed with dialysis.  Blood pressures are poorly controlled today but generally have been okay.

## 2022-04-08 DIAGNOSIS — Z992 Dependence on renal dialysis: Secondary | ICD-10-CM | POA: Diagnosis not present

## 2022-04-08 DIAGNOSIS — D631 Anemia in chronic kidney disease: Secondary | ICD-10-CM | POA: Diagnosis not present

## 2022-04-08 DIAGNOSIS — N2581 Secondary hyperparathyroidism of renal origin: Secondary | ICD-10-CM | POA: Diagnosis not present

## 2022-04-08 DIAGNOSIS — N186 End stage renal disease: Secondary | ICD-10-CM | POA: Diagnosis not present

## 2022-04-10 DIAGNOSIS — N186 End stage renal disease: Secondary | ICD-10-CM | POA: Diagnosis not present

## 2022-04-10 DIAGNOSIS — Z992 Dependence on renal dialysis: Secondary | ICD-10-CM | POA: Diagnosis not present

## 2022-04-10 DIAGNOSIS — D631 Anemia in chronic kidney disease: Secondary | ICD-10-CM | POA: Diagnosis not present

## 2022-04-10 DIAGNOSIS — N2581 Secondary hyperparathyroidism of renal origin: Secondary | ICD-10-CM | POA: Diagnosis not present

## 2022-04-11 ENCOUNTER — Telehealth (HOSPITAL_BASED_OUTPATIENT_CLINIC_OR_DEPARTMENT_OTHER): Payer: Self-pay | Admitting: Cardiovascular Disease

## 2022-04-11 NOTE — Telephone Encounter (Signed)
New Message:      Patient saw Dr Oval Linsey on 04-07-22. She was told to write her blood pressure readings down. She was told if it got up to 160 or more, let her know.   04-07-22- 160/81  at 6:00 PM   149/62 at 10:00 PM   04-09-22-- 145/59 3:00 PM    149/68 8:00 PM   141/61  10:30 PM    04-11-22-   141/73  11:00 AM   145/64 3:15 PM

## 2022-04-11 NOTE — Telephone Encounter (Signed)
Returned call to patient to provide the following recommendations,   "Please ensure patient is taking Hydralazine as needed for sustained BP >160 on non dialysis days.  Will route to Dr. Oval Linsey to see if she recommends any additional changes.    Loel Dubonnet, NP"    No answer, left message with the above recommendations

## 2022-04-11 NOTE — Telephone Encounter (Signed)
Please ensure patient is taking Hydralazine as needed for sustained BP >160 on non dialysis days.  Will route to Dr. Oval Linsey to see if she recommends any additional changes.   Loel Dubonnet, NP

## 2022-04-12 DIAGNOSIS — Z992 Dependence on renal dialysis: Secondary | ICD-10-CM | POA: Diagnosis not present

## 2022-04-12 DIAGNOSIS — N186 End stage renal disease: Secondary | ICD-10-CM | POA: Diagnosis not present

## 2022-04-12 DIAGNOSIS — D631 Anemia in chronic kidney disease: Secondary | ICD-10-CM | POA: Diagnosis not present

## 2022-04-12 DIAGNOSIS — N2581 Secondary hyperparathyroidism of renal origin: Secondary | ICD-10-CM | POA: Diagnosis not present

## 2022-04-15 DIAGNOSIS — Z992 Dependence on renal dialysis: Secondary | ICD-10-CM | POA: Diagnosis not present

## 2022-04-15 DIAGNOSIS — N186 End stage renal disease: Secondary | ICD-10-CM | POA: Diagnosis not present

## 2022-04-15 DIAGNOSIS — N2581 Secondary hyperparathyroidism of renal origin: Secondary | ICD-10-CM | POA: Diagnosis not present

## 2022-04-15 DIAGNOSIS — D631 Anemia in chronic kidney disease: Secondary | ICD-10-CM | POA: Diagnosis not present

## 2022-04-17 DIAGNOSIS — Z992 Dependence on renal dialysis: Secondary | ICD-10-CM | POA: Diagnosis not present

## 2022-04-17 DIAGNOSIS — N2581 Secondary hyperparathyroidism of renal origin: Secondary | ICD-10-CM | POA: Diagnosis not present

## 2022-04-17 DIAGNOSIS — D631 Anemia in chronic kidney disease: Secondary | ICD-10-CM | POA: Diagnosis not present

## 2022-04-17 DIAGNOSIS — N186 End stage renal disease: Secondary | ICD-10-CM | POA: Diagnosis not present

## 2022-04-19 DIAGNOSIS — D631 Anemia in chronic kidney disease: Secondary | ICD-10-CM | POA: Diagnosis not present

## 2022-04-19 DIAGNOSIS — N186 End stage renal disease: Secondary | ICD-10-CM | POA: Diagnosis not present

## 2022-04-19 DIAGNOSIS — N2581 Secondary hyperparathyroidism of renal origin: Secondary | ICD-10-CM | POA: Diagnosis not present

## 2022-04-19 DIAGNOSIS — Z992 Dependence on renal dialysis: Secondary | ICD-10-CM | POA: Diagnosis not present

## 2022-04-22 DIAGNOSIS — N2581 Secondary hyperparathyroidism of renal origin: Secondary | ICD-10-CM | POA: Diagnosis not present

## 2022-04-22 DIAGNOSIS — N186 End stage renal disease: Secondary | ICD-10-CM | POA: Diagnosis not present

## 2022-04-22 DIAGNOSIS — Z992 Dependence on renal dialysis: Secondary | ICD-10-CM | POA: Diagnosis not present

## 2022-04-22 DIAGNOSIS — D631 Anemia in chronic kidney disease: Secondary | ICD-10-CM | POA: Diagnosis not present

## 2022-04-23 DIAGNOSIS — D1801 Hemangioma of skin and subcutaneous tissue: Secondary | ICD-10-CM | POA: Diagnosis not present

## 2022-04-23 DIAGNOSIS — D2261 Melanocytic nevi of right upper limb, including shoulder: Secondary | ICD-10-CM | POA: Diagnosis not present

## 2022-04-23 DIAGNOSIS — L820 Inflamed seborrheic keratosis: Secondary | ICD-10-CM | POA: Diagnosis not present

## 2022-04-23 DIAGNOSIS — L738 Other specified follicular disorders: Secondary | ICD-10-CM | POA: Diagnosis not present

## 2022-04-23 DIAGNOSIS — L821 Other seborrheic keratosis: Secondary | ICD-10-CM | POA: Diagnosis not present

## 2022-04-24 ENCOUNTER — Encounter (HOSPITAL_BASED_OUTPATIENT_CLINIC_OR_DEPARTMENT_OTHER): Payer: Self-pay | Admitting: Cardiovascular Disease

## 2022-04-24 DIAGNOSIS — D631 Anemia in chronic kidney disease: Secondary | ICD-10-CM | POA: Diagnosis not present

## 2022-04-24 DIAGNOSIS — N2581 Secondary hyperparathyroidism of renal origin: Secondary | ICD-10-CM | POA: Diagnosis not present

## 2022-04-24 DIAGNOSIS — Z992 Dependence on renal dialysis: Secondary | ICD-10-CM | POA: Diagnosis not present

## 2022-04-24 DIAGNOSIS — N186 End stage renal disease: Secondary | ICD-10-CM | POA: Diagnosis not present

## 2022-04-26 DIAGNOSIS — N2581 Secondary hyperparathyroidism of renal origin: Secondary | ICD-10-CM | POA: Diagnosis not present

## 2022-04-26 DIAGNOSIS — N186 End stage renal disease: Secondary | ICD-10-CM | POA: Diagnosis not present

## 2022-04-26 DIAGNOSIS — Z992 Dependence on renal dialysis: Secondary | ICD-10-CM | POA: Diagnosis not present

## 2022-04-26 DIAGNOSIS — D631 Anemia in chronic kidney disease: Secondary | ICD-10-CM | POA: Diagnosis not present

## 2022-04-29 DIAGNOSIS — N186 End stage renal disease: Secondary | ICD-10-CM | POA: Diagnosis not present

## 2022-04-29 DIAGNOSIS — Z992 Dependence on renal dialysis: Secondary | ICD-10-CM | POA: Diagnosis not present

## 2022-04-29 DIAGNOSIS — D631 Anemia in chronic kidney disease: Secondary | ICD-10-CM | POA: Diagnosis not present

## 2022-04-29 DIAGNOSIS — N2581 Secondary hyperparathyroidism of renal origin: Secondary | ICD-10-CM | POA: Diagnosis not present

## 2022-05-01 DIAGNOSIS — N2581 Secondary hyperparathyroidism of renal origin: Secondary | ICD-10-CM | POA: Diagnosis not present

## 2022-05-01 DIAGNOSIS — N186 End stage renal disease: Secondary | ICD-10-CM | POA: Diagnosis not present

## 2022-05-01 DIAGNOSIS — D631 Anemia in chronic kidney disease: Secondary | ICD-10-CM | POA: Diagnosis not present

## 2022-05-01 DIAGNOSIS — Z992 Dependence on renal dialysis: Secondary | ICD-10-CM | POA: Diagnosis not present

## 2022-05-03 DIAGNOSIS — N2581 Secondary hyperparathyroidism of renal origin: Secondary | ICD-10-CM | POA: Diagnosis not present

## 2022-05-03 DIAGNOSIS — N186 End stage renal disease: Secondary | ICD-10-CM | POA: Diagnosis not present

## 2022-05-03 DIAGNOSIS — Z992 Dependence on renal dialysis: Secondary | ICD-10-CM | POA: Diagnosis not present

## 2022-05-03 DIAGNOSIS — D631 Anemia in chronic kidney disease: Secondary | ICD-10-CM | POA: Diagnosis not present

## 2022-05-05 DIAGNOSIS — N186 End stage renal disease: Secondary | ICD-10-CM | POA: Diagnosis not present

## 2022-05-05 DIAGNOSIS — I129 Hypertensive chronic kidney disease with stage 1 through stage 4 chronic kidney disease, or unspecified chronic kidney disease: Secondary | ICD-10-CM | POA: Diagnosis not present

## 2022-05-05 DIAGNOSIS — Z992 Dependence on renal dialysis: Secondary | ICD-10-CM | POA: Diagnosis not present

## 2022-05-06 DIAGNOSIS — N2581 Secondary hyperparathyroidism of renal origin: Secondary | ICD-10-CM | POA: Diagnosis not present

## 2022-05-06 DIAGNOSIS — Z992 Dependence on renal dialysis: Secondary | ICD-10-CM | POA: Diagnosis not present

## 2022-05-06 DIAGNOSIS — N186 End stage renal disease: Secondary | ICD-10-CM | POA: Diagnosis not present

## 2022-05-06 NOTE — Telephone Encounter (Signed)
Left message for patient with Dr. Oval Linsey follow up recommendations

## 2022-05-08 DIAGNOSIS — N2581 Secondary hyperparathyroidism of renal origin: Secondary | ICD-10-CM | POA: Diagnosis not present

## 2022-05-08 DIAGNOSIS — Z992 Dependence on renal dialysis: Secondary | ICD-10-CM | POA: Diagnosis not present

## 2022-05-08 DIAGNOSIS — N186 End stage renal disease: Secondary | ICD-10-CM | POA: Diagnosis not present

## 2022-05-10 DIAGNOSIS — N186 End stage renal disease: Secondary | ICD-10-CM | POA: Diagnosis not present

## 2022-05-10 DIAGNOSIS — Z992 Dependence on renal dialysis: Secondary | ICD-10-CM | POA: Diagnosis not present

## 2022-05-10 DIAGNOSIS — N2581 Secondary hyperparathyroidism of renal origin: Secondary | ICD-10-CM | POA: Diagnosis not present

## 2022-05-13 DIAGNOSIS — N2581 Secondary hyperparathyroidism of renal origin: Secondary | ICD-10-CM | POA: Diagnosis not present

## 2022-05-13 DIAGNOSIS — N186 End stage renal disease: Secondary | ICD-10-CM | POA: Diagnosis not present

## 2022-05-13 DIAGNOSIS — Z992 Dependence on renal dialysis: Secondary | ICD-10-CM | POA: Diagnosis not present

## 2022-05-15 DIAGNOSIS — N186 End stage renal disease: Secondary | ICD-10-CM | POA: Diagnosis not present

## 2022-05-15 DIAGNOSIS — N2581 Secondary hyperparathyroidism of renal origin: Secondary | ICD-10-CM | POA: Diagnosis not present

## 2022-05-15 DIAGNOSIS — Z992 Dependence on renal dialysis: Secondary | ICD-10-CM | POA: Diagnosis not present

## 2022-05-17 DIAGNOSIS — N186 End stage renal disease: Secondary | ICD-10-CM | POA: Diagnosis not present

## 2022-05-17 DIAGNOSIS — N2581 Secondary hyperparathyroidism of renal origin: Secondary | ICD-10-CM | POA: Diagnosis not present

## 2022-05-17 DIAGNOSIS — Z992 Dependence on renal dialysis: Secondary | ICD-10-CM | POA: Diagnosis not present

## 2022-05-20 DIAGNOSIS — Z992 Dependence on renal dialysis: Secondary | ICD-10-CM | POA: Diagnosis not present

## 2022-05-20 DIAGNOSIS — N186 End stage renal disease: Secondary | ICD-10-CM | POA: Diagnosis not present

## 2022-05-20 DIAGNOSIS — N2581 Secondary hyperparathyroidism of renal origin: Secondary | ICD-10-CM | POA: Diagnosis not present

## 2022-05-22 DIAGNOSIS — N2581 Secondary hyperparathyroidism of renal origin: Secondary | ICD-10-CM | POA: Diagnosis not present

## 2022-05-22 DIAGNOSIS — Z992 Dependence on renal dialysis: Secondary | ICD-10-CM | POA: Diagnosis not present

## 2022-05-22 DIAGNOSIS — N186 End stage renal disease: Secondary | ICD-10-CM | POA: Diagnosis not present

## 2022-05-24 DIAGNOSIS — Z992 Dependence on renal dialysis: Secondary | ICD-10-CM | POA: Diagnosis not present

## 2022-05-24 DIAGNOSIS — N2581 Secondary hyperparathyroidism of renal origin: Secondary | ICD-10-CM | POA: Diagnosis not present

## 2022-05-24 DIAGNOSIS — N186 End stage renal disease: Secondary | ICD-10-CM | POA: Diagnosis not present

## 2022-05-27 DIAGNOSIS — Z992 Dependence on renal dialysis: Secondary | ICD-10-CM | POA: Diagnosis not present

## 2022-05-27 DIAGNOSIS — N2581 Secondary hyperparathyroidism of renal origin: Secondary | ICD-10-CM | POA: Diagnosis not present

## 2022-05-27 DIAGNOSIS — N186 End stage renal disease: Secondary | ICD-10-CM | POA: Diagnosis not present

## 2022-05-29 DIAGNOSIS — N2581 Secondary hyperparathyroidism of renal origin: Secondary | ICD-10-CM | POA: Diagnosis not present

## 2022-05-29 DIAGNOSIS — Z992 Dependence on renal dialysis: Secondary | ICD-10-CM | POA: Diagnosis not present

## 2022-05-29 DIAGNOSIS — N186 End stage renal disease: Secondary | ICD-10-CM | POA: Diagnosis not present

## 2022-05-31 DIAGNOSIS — N2581 Secondary hyperparathyroidism of renal origin: Secondary | ICD-10-CM | POA: Diagnosis not present

## 2022-05-31 DIAGNOSIS — N186 End stage renal disease: Secondary | ICD-10-CM | POA: Diagnosis not present

## 2022-05-31 DIAGNOSIS — Z992 Dependence on renal dialysis: Secondary | ICD-10-CM | POA: Diagnosis not present

## 2022-06-03 DIAGNOSIS — N186 End stage renal disease: Secondary | ICD-10-CM | POA: Diagnosis not present

## 2022-06-03 DIAGNOSIS — Z992 Dependence on renal dialysis: Secondary | ICD-10-CM | POA: Diagnosis not present

## 2022-06-03 DIAGNOSIS — N2581 Secondary hyperparathyroidism of renal origin: Secondary | ICD-10-CM | POA: Diagnosis not present

## 2022-06-05 DIAGNOSIS — I129 Hypertensive chronic kidney disease with stage 1 through stage 4 chronic kidney disease, or unspecified chronic kidney disease: Secondary | ICD-10-CM | POA: Diagnosis not present

## 2022-06-05 DIAGNOSIS — N2581 Secondary hyperparathyroidism of renal origin: Secondary | ICD-10-CM | POA: Diagnosis not present

## 2022-06-05 DIAGNOSIS — N186 End stage renal disease: Secondary | ICD-10-CM | POA: Diagnosis not present

## 2022-06-05 DIAGNOSIS — D631 Anemia in chronic kidney disease: Secondary | ICD-10-CM | POA: Diagnosis not present

## 2022-06-05 DIAGNOSIS — Z992 Dependence on renal dialysis: Secondary | ICD-10-CM | POA: Diagnosis not present

## 2022-06-07 DIAGNOSIS — N2581 Secondary hyperparathyroidism of renal origin: Secondary | ICD-10-CM | POA: Diagnosis not present

## 2022-06-07 DIAGNOSIS — Z992 Dependence on renal dialysis: Secondary | ICD-10-CM | POA: Diagnosis not present

## 2022-06-07 DIAGNOSIS — D631 Anemia in chronic kidney disease: Secondary | ICD-10-CM | POA: Diagnosis not present

## 2022-06-07 DIAGNOSIS — N186 End stage renal disease: Secondary | ICD-10-CM | POA: Diagnosis not present

## 2022-06-08 NOTE — Progress Notes (Unsigned)
Cardiology Office Note:    Date:  06/09/2022   ID:  Yvonne Powell, DOB 1928-11-17, MRN [id number]  PCP:  Yvonne Hatchet, MD   [place] Prov[facility] Cardiologist:  Yvonne Latch, MD Cardiology APP:  Yvonne Dubonnet, NP     Referring MD: Yvonne Hatchet, MD   Chief Complaint  Patient presents with   follow up for hypertension    Seen for Yvonne Powell     History of Present Illness:    Yvonne Powell is a 89 y.o. female with a hx of chronic diastolic heart failure, hypertension, hyperlipidemia, ESRD on HD (T, TH, SA), hypothyroidism, and prior DVT.  Most recently she was seen in our office by Yvonne Powell on 04/07/2022, at that time she was doing okay from a cardiac perspective however she was having very labile blood pressure readings.  Previously she had low blood pressure readings typically after her HD sessions, however at this visit it was unusually high.  The decision was made to start her on hydralazine to take as needed for sustained blood pressures greater than 120 and only take on her non-HD days.  She was to keep a blood pressure log and report the readings, they were 141-160/59-83.  It was reiterated that she would take hydralazine as needed for sustained blood pressures greater than 160.  She presents today for follow up of her blood pressure. She checks her BP on non-HD days, and the readings are typically "120-130's". She has not had one reading that is elevated > 338 systolic so she has not needed to take hydralazine. She has low readings at her HD session, but this is baseline for her and she has no dizziness, syncope or presyncope. She denies chest pain, palpitations, dyspnea, pnd, orthopnea, n, v, dizziness, syncope, edema, weight gain, or early satiety.   Past Medical History:  Diagnosis Date   Anemia    due to kidney   Chronic diastolic heart failure ([place]) 09/08/2019   DVT (deep venous thrombosis) (HCC)    hx   End stage renal disease ([place])     Gallstones 12/2018   GERD (gastroesophageal reflux disease)    Hypertension    Renal disorder    Thyroid disease    graves dx    Past Surgical History:  Procedure Laterality Date   AV FISTULA PLACEMENT Right 04/07/2017   Procedure: ARTERIOVENOUS (AV) FISTULA CREATION;  Surgeon: Yvonne Dutch, MD;  Location: [place];  Service: Vascular;  Laterality: Right;   CHOLECYSTECTOMY N/A 12/23/2018   Procedure: LAPAROSCOPIC CHOLECYSTECTOMY;  Surgeon: Yvonne Luna, MD;  Location: [place];  Service: General;  Laterality: N/A;   COLONOSCOPY     DILATION AND CURETTAGE OF UTERUS     EYE SURGERY     cactaracts   LIPOMA EXCISION     LUMBAR EPIDURAL INJECTION  2018    Current Medications: Current Meds  Medication Sig   acetaminophen (TYLENOL) 325 MG tablet Take 325 mg by mouth every 6 (six) hours as needed for mild pain (or headaches).   hydrALAZINE (APRESOLINE) 25 MG tablet Take 1 tablet (25 mg total) by mouth daily as needed. ON NON DIALYSIS DAYS   Iron-FA-DSS-B Cmplx-Vit C (NEPHRON FA) TABS Take 1 tablet by mouth daily before supper.   levothyroxine (SYNTHROID, LEVOTHROID) 88 MCG tablet Take 88 mcg by mouth daily before breakfast.   Methoxy PEG-Epoetin Beta (MIRCERA IJ)    RENVELA 0.8 g PACK packet Take 0.8 g by mouth 3 (three) times daily with meals.  Allergies:   Calcitriol, Doxycycline, Fish allergy, Fish-derived products, Iodine, Penicillin g, Shellfish allergy, Shellfish-derived products, Clindamycin/lincomycin, Compazine [prochlorperazine edisylate], Prochlorperazine, Sulfa antibiotics, and Penicillins   Social History   Socioeconomic History   Marital status: Divorced    Spouse name: Not on file   Number of children: 3   Years of education: Not on file   Highest education level: Not on file  Occupational History   Occupation: retired  Tobacco Use   Smoking status: Never   Smokeless tobacco: Never  Vaping Use   Vaping Use: Never used  Substance and Sexual Activity    Alcohol use: No   Drug use: No   Sexual activity: Not on file  Other Topics Concern   Not on file  Social History Narrative   Not on file   Social Determinants of Health   Financial Resource Strain: Low Risk  (11/06/2021)   Overall Financial Resource Strain (CARDIA)    Difficulty of Paying Living Expenses: Not hard at all  Food Insecurity: No Food Insecurity (11/06/2021)   Hunger Vital Sign    Worried About Running Out of Food in the Last Year: Never true    [place] in the Last Year: Never true  Transportation Needs: No Transportation Needs (11/06/2021)   PRAPARE - Hydrologist (Medical): No    Lack of Transportation (Non-Medical): No  Physical Activity: Not on file  Stress: No Stress Concern Present (11/06/2021)   [place]    Feeling of Stress : Not at all  Social Connections: Moderately Integrated (11/06/2021)   Social Connection and Isolation Panel [NHANES]    Frequency of Communication with Friends and Family: More than three times a week    Frequency of Social Gatherings with Friends and Family: More than three times a week    Attends Religious Services: 1 to 4 times per year    Active Member of [employer] or Organizations: Yes    Attends Archivist Meetings: 1 to 4 times per year    Marital Status: Divorced     Family History: The patient's family history includes Breast cancer in her mother and sister; Congestive Heart Failure (age of onset: 54) in her maternal grandmother; Hypertension in her maternal grandmother; Ulcers in her father. There is no history of Colon polyps, Liver disease, or Esophageal cancer.  ROS:   Please see the history of present illness.    All other systems reviewed and are negative.  EKGs/Labs/Other Studies Reviewed:    The following studies were reviewed today:    EKG:  EKG is not ordered today.    Recent Labs: 02/01/2022: ALT 10; BUN 36;  Creatinine, Ser 6.10; Hemoglobin 10.1; Platelets 178; Potassium 3.7; Sodium 135  Recent Lipid Panel No results found for: "CHOL", "TRIG", "HDL", "CHOLHDL", "VLDL", "LDLCALC", "LDLDIRECT"   Risk Assessment/Calculations:                Physical Exam:    VS:  BP 130/62   Pulse 84   Ht '5\' 4"'$  (1.626 m)   Wt 114 lb (51.7 kg)   BMI 19.57 kg/m     Wt Readings from Last 3 Encounters:  06/09/22 114 lb (51.7 kg)  04/07/22 116 lb 9.6 oz (52.9 kg)  04/02/22 117 lb (53.1 kg)     GEN:  Well nourished, well developed in no acute distress HEENT: Normal NECK: No JVD; No carotid bruits LYMPHATICS: No lymphadenopathy  CARDIAC: RRR, no murmurs, rubs, gallops RESPIRATORY:  Clear to auscultation without rales, wheezing or rhonchi  ABDOMEN: Soft, non-tender, non-distended MUSCULOSKELETAL:  No edema; No deformity  SKIN: Warm and dry NEUROLOGIC:  Alert and oriented x 3 PSYCHIATRIC:  Normal affect   ASSESSMENT:    1. Essential hypertension   2. Chronic diastolic heart failure ([place])   3. ESRD (end stage renal disease) on dialysis ([place])    PLAN:    In order of problems listed above:  HTN - BP today 130/62, well controlled, she has not needed to take her PRN hydralazine but has it and knows the indications for when to take it.  Chronic diastolic HF - NYHA class I today, euvolemic, volume management per HD.   ESRD on HD - Stable, tolerating treatments ok, Tu, Th, and Sat.   Disposition - f/u with Yvonne Powell in 6 months.            Medication Adjustments/Labs and Tests Ordered: Current medicines are reviewed at length with the patient today.  Concerns regarding medicines are outlined above.  No orders of the defined types were placed in this encounter.  No orders of the defined types were placed in this encounter.   Patient Instructions  Medication Instructions:  Your Physician recommend you continue on your current medication as directed.    *If you need a refill on your  cardiac medications before your next appointment, please call your pharmacy*  Follow-Up: At [facility], you and your health needs are our priority.  As part of our continuing mission to provide you with exceptional heart care, we have created designated Provider Care Teams.  These Care Teams include your primary Cardiologist (physician) and Advanced Practice Providers (APPs -  Physician Assistants and Nurse Practitioners) who all work together to provide you with the care you need, when you need it.  We recommend signing up for the patient portal called "MyChart".  Sign up information is provided on this After Visit Summary.  MyChart is used to connect with patients for Virtual Visits (Telemedicine).  Patients are able to view lab/test results, encounter notes, upcoming appointments, etc.  Non-urgent messages can be sent to your provider as well.   To learn more about what you can do with MyChart, go to [email or web address].    Your next appointment:   6 month(s)  Provider:   Skeet Latch, MD or [name], NP    Other Instructions Heart Healthy Diet Recommendations: A low-salt diet is recommended. Meats should be grilled, baked, or boiled. Avoid fried foods. Focus on lean protein sources like fish or chicken with vegetables and fruits. The American Heart Association is a Microbiologist!  American Heart Association Diet and Lifeystyle Recommendations   Exercise recommendations: The American Heart Association recommends 150 minutes of moderate intensity exercise weekly. Try 30 minutes of moderate intensity exercise 4-5 times per week. This could include walking, jogging, or swimming.    Signed, Trudi Ida, NP  06/09/2022 11:28 AM    [place]

## 2022-06-09 ENCOUNTER — Ambulatory Visit (INDEPENDENT_AMBULATORY_CARE_PROVIDER_SITE_OTHER): Payer: Medicare Other | Admitting: Cardiology

## 2022-06-09 ENCOUNTER — Encounter (HOSPITAL_BASED_OUTPATIENT_CLINIC_OR_DEPARTMENT_OTHER): Payer: Self-pay | Admitting: Cardiology

## 2022-06-09 VITALS — BP 130/62 | HR 84 | Ht 64.0 in | Wt 114.0 lb

## 2022-06-09 DIAGNOSIS — I5032 Chronic diastolic (congestive) heart failure: Secondary | ICD-10-CM

## 2022-06-09 DIAGNOSIS — N186 End stage renal disease: Secondary | ICD-10-CM | POA: Diagnosis not present

## 2022-06-09 DIAGNOSIS — I10 Essential (primary) hypertension: Secondary | ICD-10-CM | POA: Diagnosis not present

## 2022-06-09 DIAGNOSIS — Z992 Dependence on renal dialysis: Secondary | ICD-10-CM | POA: Diagnosis not present

## 2022-06-09 NOTE — Patient Instructions (Signed)
Medication Instructions:  Your Physician recommend you continue on your current medication as directed.    *If you need a refill on your cardiac medications before your next appointment, please call your pharmacy*  Follow-Up: At [facility], you and your health needs are our priority.  As part of our continuing mission to provide you with exceptional heart care, we have created designated Provider Care Teams.  These Care Teams include your primary Cardiologist (physician) and Advanced Practice Providers (APPs -  Physician Assistants and Nurse Practitioners) who all work together to provide you with the care you need, when you need it.  We recommend signing up for the patient portal called "MyChart".  Sign up information is provided on this After Visit Summary.  MyChart is used to connect with patients for Virtual Visits (Telemedicine).  Patients are able to view lab/test results, encounter notes, upcoming appointments, etc.  Non-urgent messages can be sent to your provider as well.   To learn more about what you can do with MyChart, go to [email or web address].    Your next appointment:   6 month(s)  Provider:   Skeet Latch, MD or [name], NP    Other Instructions Heart Healthy Diet Recommendations: A low-salt diet is recommended. Meats should be grilled, baked, or boiled. Avoid fried foods. Focus on lean protein sources like fish or chicken with vegetables and fruits. The American Heart Association is a Microbiologist!  American Heart Association Diet and Lifeystyle Recommendations   Exercise recommendations: The American Heart Association recommends 150 minutes of moderate intensity exercise weekly. Try 30 minutes of moderate intensity exercise 4-5 times per week. This could include walking, jogging, or swimming.

## 2022-06-10 DIAGNOSIS — D631 Anemia in chronic kidney disease: Secondary | ICD-10-CM | POA: Diagnosis not present

## 2022-06-10 DIAGNOSIS — Z992 Dependence on renal dialysis: Secondary | ICD-10-CM | POA: Diagnosis not present

## 2022-06-10 DIAGNOSIS — N2581 Secondary hyperparathyroidism of renal origin: Secondary | ICD-10-CM | POA: Diagnosis not present

## 2022-06-10 DIAGNOSIS — N186 End stage renal disease: Secondary | ICD-10-CM | POA: Diagnosis not present

## 2022-06-12 DIAGNOSIS — Z992 Dependence on renal dialysis: Secondary | ICD-10-CM | POA: Diagnosis not present

## 2022-06-12 DIAGNOSIS — D631 Anemia in chronic kidney disease: Secondary | ICD-10-CM | POA: Diagnosis not present

## 2022-06-12 DIAGNOSIS — N2581 Secondary hyperparathyroidism of renal origin: Secondary | ICD-10-CM | POA: Diagnosis not present

## 2022-06-12 DIAGNOSIS — N186 End stage renal disease: Secondary | ICD-10-CM | POA: Diagnosis not present

## 2022-06-14 DIAGNOSIS — D631 Anemia in chronic kidney disease: Secondary | ICD-10-CM | POA: Diagnosis not present

## 2022-06-14 DIAGNOSIS — Z992 Dependence on renal dialysis: Secondary | ICD-10-CM | POA: Diagnosis not present

## 2022-06-14 DIAGNOSIS — N186 End stage renal disease: Secondary | ICD-10-CM | POA: Diagnosis not present

## 2022-06-14 DIAGNOSIS — N2581 Secondary hyperparathyroidism of renal origin: Secondary | ICD-10-CM | POA: Diagnosis not present

## 2022-06-17 DIAGNOSIS — N186 End stage renal disease: Secondary | ICD-10-CM | POA: Diagnosis not present

## 2022-06-17 DIAGNOSIS — D631 Anemia in chronic kidney disease: Secondary | ICD-10-CM | POA: Diagnosis not present

## 2022-06-17 DIAGNOSIS — N2581 Secondary hyperparathyroidism of renal origin: Secondary | ICD-10-CM | POA: Diagnosis not present

## 2022-06-17 DIAGNOSIS — Z992 Dependence on renal dialysis: Secondary | ICD-10-CM | POA: Diagnosis not present

## 2022-06-19 DIAGNOSIS — Z992 Dependence on renal dialysis: Secondary | ICD-10-CM | POA: Diagnosis not present

## 2022-06-19 DIAGNOSIS — N186 End stage renal disease: Secondary | ICD-10-CM | POA: Diagnosis not present

## 2022-06-19 DIAGNOSIS — D631 Anemia in chronic kidney disease: Secondary | ICD-10-CM | POA: Diagnosis not present

## 2022-06-19 DIAGNOSIS — N2581 Secondary hyperparathyroidism of renal origin: Secondary | ICD-10-CM | POA: Diagnosis not present

## 2022-06-21 DIAGNOSIS — N186 End stage renal disease: Secondary | ICD-10-CM | POA: Diagnosis not present

## 2022-06-21 DIAGNOSIS — D631 Anemia in chronic kidney disease: Secondary | ICD-10-CM | POA: Diagnosis not present

## 2022-06-21 DIAGNOSIS — Z992 Dependence on renal dialysis: Secondary | ICD-10-CM | POA: Diagnosis not present

## 2022-06-21 DIAGNOSIS — N2581 Secondary hyperparathyroidism of renal origin: Secondary | ICD-10-CM | POA: Diagnosis not present

## 2022-06-24 DIAGNOSIS — N2581 Secondary hyperparathyroidism of renal origin: Secondary | ICD-10-CM | POA: Diagnosis not present

## 2022-06-24 DIAGNOSIS — N186 End stage renal disease: Secondary | ICD-10-CM | POA: Diagnosis not present

## 2022-06-24 DIAGNOSIS — D631 Anemia in chronic kidney disease: Secondary | ICD-10-CM | POA: Diagnosis not present

## 2022-06-24 DIAGNOSIS — Z992 Dependence on renal dialysis: Secondary | ICD-10-CM | POA: Diagnosis not present

## 2022-06-26 DIAGNOSIS — N186 End stage renal disease: Secondary | ICD-10-CM | POA: Diagnosis not present

## 2022-06-26 DIAGNOSIS — D631 Anemia in chronic kidney disease: Secondary | ICD-10-CM | POA: Diagnosis not present

## 2022-06-26 DIAGNOSIS — Z992 Dependence on renal dialysis: Secondary | ICD-10-CM | POA: Diagnosis not present

## 2022-06-26 DIAGNOSIS — N2581 Secondary hyperparathyroidism of renal origin: Secondary | ICD-10-CM | POA: Diagnosis not present

## 2022-06-28 DIAGNOSIS — N186 End stage renal disease: Secondary | ICD-10-CM | POA: Diagnosis not present

## 2022-06-28 DIAGNOSIS — Z992 Dependence on renal dialysis: Secondary | ICD-10-CM | POA: Diagnosis not present

## 2022-06-28 DIAGNOSIS — N2581 Secondary hyperparathyroidism of renal origin: Secondary | ICD-10-CM | POA: Diagnosis not present

## 2022-06-28 DIAGNOSIS — D631 Anemia in chronic kidney disease: Secondary | ICD-10-CM | POA: Diagnosis not present

## 2022-07-01 DIAGNOSIS — Z992 Dependence on renal dialysis: Secondary | ICD-10-CM | POA: Diagnosis not present

## 2022-07-01 DIAGNOSIS — N186 End stage renal disease: Secondary | ICD-10-CM | POA: Diagnosis not present

## 2022-07-01 DIAGNOSIS — D631 Anemia in chronic kidney disease: Secondary | ICD-10-CM | POA: Diagnosis not present

## 2022-07-01 DIAGNOSIS — N2581 Secondary hyperparathyroidism of renal origin: Secondary | ICD-10-CM | POA: Diagnosis not present

## 2022-07-03 DIAGNOSIS — Z992 Dependence on renal dialysis: Secondary | ICD-10-CM | POA: Diagnosis not present

## 2022-07-03 DIAGNOSIS — D631 Anemia in chronic kidney disease: Secondary | ICD-10-CM | POA: Diagnosis not present

## 2022-07-03 DIAGNOSIS — N186 End stage renal disease: Secondary | ICD-10-CM | POA: Diagnosis not present

## 2022-07-03 DIAGNOSIS — N2581 Secondary hyperparathyroidism of renal origin: Secondary | ICD-10-CM | POA: Diagnosis not present

## 2022-07-04 DIAGNOSIS — N186 End stage renal disease: Secondary | ICD-10-CM | POA: Diagnosis not present

## 2022-07-04 DIAGNOSIS — Z992 Dependence on renal dialysis: Secondary | ICD-10-CM | POA: Diagnosis not present

## 2022-07-04 DIAGNOSIS — I129 Hypertensive chronic kidney disease with stage 1 through stage 4 chronic kidney disease, or unspecified chronic kidney disease: Secondary | ICD-10-CM | POA: Diagnosis not present

## 2022-07-05 DIAGNOSIS — Z992 Dependence on renal dialysis: Secondary | ICD-10-CM | POA: Diagnosis not present

## 2022-07-05 DIAGNOSIS — N186 End stage renal disease: Secondary | ICD-10-CM | POA: Diagnosis not present

## 2022-07-05 DIAGNOSIS — N2581 Secondary hyperparathyroidism of renal origin: Secondary | ICD-10-CM | POA: Diagnosis not present

## 2022-07-05 DIAGNOSIS — D631 Anemia in chronic kidney disease: Secondary | ICD-10-CM | POA: Diagnosis not present

## 2022-07-08 DIAGNOSIS — Z992 Dependence on renal dialysis: Secondary | ICD-10-CM | POA: Diagnosis not present

## 2022-07-08 DIAGNOSIS — D631 Anemia in chronic kidney disease: Secondary | ICD-10-CM | POA: Diagnosis not present

## 2022-07-08 DIAGNOSIS — N2581 Secondary hyperparathyroidism of renal origin: Secondary | ICD-10-CM | POA: Diagnosis not present

## 2022-07-08 DIAGNOSIS — N186 End stage renal disease: Secondary | ICD-10-CM | POA: Diagnosis not present

## 2022-07-10 DIAGNOSIS — N2581 Secondary hyperparathyroidism of renal origin: Secondary | ICD-10-CM | POA: Diagnosis not present

## 2022-07-10 DIAGNOSIS — N186 End stage renal disease: Secondary | ICD-10-CM | POA: Diagnosis not present

## 2022-07-10 DIAGNOSIS — D631 Anemia in chronic kidney disease: Secondary | ICD-10-CM | POA: Diagnosis not present

## 2022-07-10 DIAGNOSIS — Z992 Dependence on renal dialysis: Secondary | ICD-10-CM | POA: Diagnosis not present

## 2022-07-12 DIAGNOSIS — D631 Anemia in chronic kidney disease: Secondary | ICD-10-CM | POA: Diagnosis not present

## 2022-07-12 DIAGNOSIS — N2581 Secondary hyperparathyroidism of renal origin: Secondary | ICD-10-CM | POA: Diagnosis not present

## 2022-07-12 DIAGNOSIS — N186 End stage renal disease: Secondary | ICD-10-CM | POA: Diagnosis not present

## 2022-07-12 DIAGNOSIS — Z992 Dependence on renal dialysis: Secondary | ICD-10-CM | POA: Diagnosis not present

## 2022-07-15 DIAGNOSIS — N2581 Secondary hyperparathyroidism of renal origin: Secondary | ICD-10-CM | POA: Diagnosis not present

## 2022-07-15 DIAGNOSIS — Z992 Dependence on renal dialysis: Secondary | ICD-10-CM | POA: Diagnosis not present

## 2022-07-15 DIAGNOSIS — N186 End stage renal disease: Secondary | ICD-10-CM | POA: Diagnosis not present

## 2022-07-15 DIAGNOSIS — D631 Anemia in chronic kidney disease: Secondary | ICD-10-CM | POA: Diagnosis not present

## 2022-07-17 DIAGNOSIS — Z992 Dependence on renal dialysis: Secondary | ICD-10-CM | POA: Diagnosis not present

## 2022-07-17 DIAGNOSIS — N186 End stage renal disease: Secondary | ICD-10-CM | POA: Diagnosis not present

## 2022-07-17 DIAGNOSIS — N2581 Secondary hyperparathyroidism of renal origin: Secondary | ICD-10-CM | POA: Diagnosis not present

## 2022-07-17 DIAGNOSIS — D631 Anemia in chronic kidney disease: Secondary | ICD-10-CM | POA: Diagnosis not present

## 2022-07-19 DIAGNOSIS — N186 End stage renal disease: Secondary | ICD-10-CM | POA: Diagnosis not present

## 2022-07-19 DIAGNOSIS — N2581 Secondary hyperparathyroidism of renal origin: Secondary | ICD-10-CM | POA: Diagnosis not present

## 2022-07-19 DIAGNOSIS — Z992 Dependence on renal dialysis: Secondary | ICD-10-CM | POA: Diagnosis not present

## 2022-07-19 DIAGNOSIS — D631 Anemia in chronic kidney disease: Secondary | ICD-10-CM | POA: Diagnosis not present

## 2022-07-22 DIAGNOSIS — Z992 Dependence on renal dialysis: Secondary | ICD-10-CM | POA: Diagnosis not present

## 2022-07-22 DIAGNOSIS — N186 End stage renal disease: Secondary | ICD-10-CM | POA: Diagnosis not present

## 2022-07-22 DIAGNOSIS — D631 Anemia in chronic kidney disease: Secondary | ICD-10-CM | POA: Diagnosis not present

## 2022-07-22 DIAGNOSIS — N2581 Secondary hyperparathyroidism of renal origin: Secondary | ICD-10-CM | POA: Diagnosis not present

## 2022-07-24 DIAGNOSIS — Z992 Dependence on renal dialysis: Secondary | ICD-10-CM | POA: Diagnosis not present

## 2022-07-24 DIAGNOSIS — N186 End stage renal disease: Secondary | ICD-10-CM | POA: Diagnosis not present

## 2022-07-24 DIAGNOSIS — N2581 Secondary hyperparathyroidism of renal origin: Secondary | ICD-10-CM | POA: Diagnosis not present

## 2022-07-24 DIAGNOSIS — D631 Anemia in chronic kidney disease: Secondary | ICD-10-CM | POA: Diagnosis not present

## 2022-07-26 DIAGNOSIS — D631 Anemia in chronic kidney disease: Secondary | ICD-10-CM | POA: Diagnosis not present

## 2022-07-26 DIAGNOSIS — Z992 Dependence on renal dialysis: Secondary | ICD-10-CM | POA: Diagnosis not present

## 2022-07-26 DIAGNOSIS — N186 End stage renal disease: Secondary | ICD-10-CM | POA: Diagnosis not present

## 2022-07-26 DIAGNOSIS — N2581 Secondary hyperparathyroidism of renal origin: Secondary | ICD-10-CM | POA: Diagnosis not present

## 2022-07-29 DIAGNOSIS — N2581 Secondary hyperparathyroidism of renal origin: Secondary | ICD-10-CM | POA: Diagnosis not present

## 2022-07-29 DIAGNOSIS — N186 End stage renal disease: Secondary | ICD-10-CM | POA: Diagnosis not present

## 2022-07-29 DIAGNOSIS — D631 Anemia in chronic kidney disease: Secondary | ICD-10-CM | POA: Diagnosis not present

## 2022-07-29 DIAGNOSIS — Z992 Dependence on renal dialysis: Secondary | ICD-10-CM | POA: Diagnosis not present

## 2022-07-31 DIAGNOSIS — N186 End stage renal disease: Secondary | ICD-10-CM | POA: Diagnosis not present

## 2022-07-31 DIAGNOSIS — D631 Anemia in chronic kidney disease: Secondary | ICD-10-CM | POA: Diagnosis not present

## 2022-07-31 DIAGNOSIS — Z992 Dependence on renal dialysis: Secondary | ICD-10-CM | POA: Diagnosis not present

## 2022-07-31 DIAGNOSIS — N2581 Secondary hyperparathyroidism of renal origin: Secondary | ICD-10-CM | POA: Diagnosis not present

## 2022-08-02 DIAGNOSIS — N2581 Secondary hyperparathyroidism of renal origin: Secondary | ICD-10-CM | POA: Diagnosis not present

## 2022-08-02 DIAGNOSIS — Z992 Dependence on renal dialysis: Secondary | ICD-10-CM | POA: Diagnosis not present

## 2022-08-02 DIAGNOSIS — N186 End stage renal disease: Secondary | ICD-10-CM | POA: Diagnosis not present

## 2022-08-02 DIAGNOSIS — D631 Anemia in chronic kidney disease: Secondary | ICD-10-CM | POA: Diagnosis not present

## 2022-08-04 DIAGNOSIS — Z992 Dependence on renal dialysis: Secondary | ICD-10-CM | POA: Diagnosis not present

## 2022-08-04 DIAGNOSIS — I129 Hypertensive chronic kidney disease with stage 1 through stage 4 chronic kidney disease, or unspecified chronic kidney disease: Secondary | ICD-10-CM | POA: Diagnosis not present

## 2022-08-04 DIAGNOSIS — N186 End stage renal disease: Secondary | ICD-10-CM | POA: Diagnosis not present

## 2022-08-05 DIAGNOSIS — N2581 Secondary hyperparathyroidism of renal origin: Secondary | ICD-10-CM | POA: Diagnosis not present

## 2022-08-05 DIAGNOSIS — N186 End stage renal disease: Secondary | ICD-10-CM | POA: Diagnosis not present

## 2022-08-05 DIAGNOSIS — Z992 Dependence on renal dialysis: Secondary | ICD-10-CM | POA: Diagnosis not present

## 2022-08-07 DIAGNOSIS — Z992 Dependence on renal dialysis: Secondary | ICD-10-CM | POA: Diagnosis not present

## 2022-08-07 DIAGNOSIS — N186 End stage renal disease: Secondary | ICD-10-CM | POA: Diagnosis not present

## 2022-08-07 DIAGNOSIS — N2581 Secondary hyperparathyroidism of renal origin: Secondary | ICD-10-CM | POA: Diagnosis not present

## 2022-08-09 DIAGNOSIS — N2581 Secondary hyperparathyroidism of renal origin: Secondary | ICD-10-CM | POA: Diagnosis not present

## 2022-08-09 DIAGNOSIS — N186 End stage renal disease: Secondary | ICD-10-CM | POA: Diagnosis not present

## 2022-08-09 DIAGNOSIS — Z992 Dependence on renal dialysis: Secondary | ICD-10-CM | POA: Diagnosis not present

## 2022-08-12 DIAGNOSIS — N186 End stage renal disease: Secondary | ICD-10-CM | POA: Diagnosis not present

## 2022-08-12 DIAGNOSIS — Z992 Dependence on renal dialysis: Secondary | ICD-10-CM | POA: Diagnosis not present

## 2022-08-12 DIAGNOSIS — N2581 Secondary hyperparathyroidism of renal origin: Secondary | ICD-10-CM | POA: Diagnosis not present

## 2022-08-14 DIAGNOSIS — N2581 Secondary hyperparathyroidism of renal origin: Secondary | ICD-10-CM | POA: Diagnosis not present

## 2022-08-14 DIAGNOSIS — Z992 Dependence on renal dialysis: Secondary | ICD-10-CM | POA: Diagnosis not present

## 2022-08-14 DIAGNOSIS — N186 End stage renal disease: Secondary | ICD-10-CM | POA: Diagnosis not present

## 2022-08-16 DIAGNOSIS — N186 End stage renal disease: Secondary | ICD-10-CM | POA: Diagnosis not present

## 2022-08-16 DIAGNOSIS — N2581 Secondary hyperparathyroidism of renal origin: Secondary | ICD-10-CM | POA: Diagnosis not present

## 2022-08-16 DIAGNOSIS — Z992 Dependence on renal dialysis: Secondary | ICD-10-CM | POA: Diagnosis not present

## 2022-08-19 DIAGNOSIS — Z992 Dependence on renal dialysis: Secondary | ICD-10-CM | POA: Diagnosis not present

## 2022-08-19 DIAGNOSIS — N2581 Secondary hyperparathyroidism of renal origin: Secondary | ICD-10-CM | POA: Diagnosis not present

## 2022-08-19 DIAGNOSIS — N186 End stage renal disease: Secondary | ICD-10-CM | POA: Diagnosis not present

## 2022-08-21 DIAGNOSIS — Z992 Dependence on renal dialysis: Secondary | ICD-10-CM | POA: Diagnosis not present

## 2022-08-21 DIAGNOSIS — N186 End stage renal disease: Secondary | ICD-10-CM | POA: Diagnosis not present

## 2022-08-21 DIAGNOSIS — N2581 Secondary hyperparathyroidism of renal origin: Secondary | ICD-10-CM | POA: Diagnosis not present

## 2022-08-23 DIAGNOSIS — N2581 Secondary hyperparathyroidism of renal origin: Secondary | ICD-10-CM | POA: Diagnosis not present

## 2022-08-23 DIAGNOSIS — Z992 Dependence on renal dialysis: Secondary | ICD-10-CM | POA: Diagnosis not present

## 2022-08-23 DIAGNOSIS — N186 End stage renal disease: Secondary | ICD-10-CM | POA: Diagnosis not present

## 2022-08-26 DIAGNOSIS — N2581 Secondary hyperparathyroidism of renal origin: Secondary | ICD-10-CM | POA: Diagnosis not present

## 2022-08-26 DIAGNOSIS — N186 End stage renal disease: Secondary | ICD-10-CM | POA: Diagnosis not present

## 2022-08-26 DIAGNOSIS — Z992 Dependence on renal dialysis: Secondary | ICD-10-CM | POA: Diagnosis not present

## 2022-08-28 DIAGNOSIS — Z992 Dependence on renal dialysis: Secondary | ICD-10-CM | POA: Diagnosis not present

## 2022-08-28 DIAGNOSIS — N186 End stage renal disease: Secondary | ICD-10-CM | POA: Diagnosis not present

## 2022-08-28 DIAGNOSIS — N2581 Secondary hyperparathyroidism of renal origin: Secondary | ICD-10-CM | POA: Diagnosis not present

## 2022-08-30 DIAGNOSIS — Z992 Dependence on renal dialysis: Secondary | ICD-10-CM | POA: Diagnosis not present

## 2022-08-30 DIAGNOSIS — N186 End stage renal disease: Secondary | ICD-10-CM | POA: Diagnosis not present

## 2022-08-30 DIAGNOSIS — N2581 Secondary hyperparathyroidism of renal origin: Secondary | ICD-10-CM | POA: Diagnosis not present

## 2022-09-02 DIAGNOSIS — N2581 Secondary hyperparathyroidism of renal origin: Secondary | ICD-10-CM | POA: Diagnosis not present

## 2022-09-02 DIAGNOSIS — N186 End stage renal disease: Secondary | ICD-10-CM | POA: Diagnosis not present

## 2022-09-02 DIAGNOSIS — Z992 Dependence on renal dialysis: Secondary | ICD-10-CM | POA: Diagnosis not present

## 2022-09-03 DIAGNOSIS — Z992 Dependence on renal dialysis: Secondary | ICD-10-CM | POA: Diagnosis not present

## 2022-09-03 DIAGNOSIS — N186 End stage renal disease: Secondary | ICD-10-CM | POA: Diagnosis not present

## 2022-09-03 DIAGNOSIS — I129 Hypertensive chronic kidney disease with stage 1 through stage 4 chronic kidney disease, or unspecified chronic kidney disease: Secondary | ICD-10-CM | POA: Diagnosis not present

## 2022-09-04 DIAGNOSIS — N186 End stage renal disease: Secondary | ICD-10-CM | POA: Diagnosis not present

## 2022-09-04 DIAGNOSIS — N2581 Secondary hyperparathyroidism of renal origin: Secondary | ICD-10-CM | POA: Diagnosis not present

## 2022-09-04 DIAGNOSIS — Z992 Dependence on renal dialysis: Secondary | ICD-10-CM | POA: Diagnosis not present

## 2022-09-04 DIAGNOSIS — D631 Anemia in chronic kidney disease: Secondary | ICD-10-CM | POA: Diagnosis not present

## 2022-09-06 DIAGNOSIS — N186 End stage renal disease: Secondary | ICD-10-CM | POA: Diagnosis not present

## 2022-09-06 DIAGNOSIS — Z992 Dependence on renal dialysis: Secondary | ICD-10-CM | POA: Diagnosis not present

## 2022-09-06 DIAGNOSIS — D631 Anemia in chronic kidney disease: Secondary | ICD-10-CM | POA: Diagnosis not present

## 2022-09-06 DIAGNOSIS — N2581 Secondary hyperparathyroidism of renal origin: Secondary | ICD-10-CM | POA: Diagnosis not present

## 2022-09-09 DIAGNOSIS — D631 Anemia in chronic kidney disease: Secondary | ICD-10-CM | POA: Diagnosis not present

## 2022-09-09 DIAGNOSIS — Z992 Dependence on renal dialysis: Secondary | ICD-10-CM | POA: Diagnosis not present

## 2022-09-09 DIAGNOSIS — N2581 Secondary hyperparathyroidism of renal origin: Secondary | ICD-10-CM | POA: Diagnosis not present

## 2022-09-09 DIAGNOSIS — N186 End stage renal disease: Secondary | ICD-10-CM | POA: Diagnosis not present

## 2022-09-11 DIAGNOSIS — N186 End stage renal disease: Secondary | ICD-10-CM | POA: Diagnosis not present

## 2022-09-11 DIAGNOSIS — D631 Anemia in chronic kidney disease: Secondary | ICD-10-CM | POA: Diagnosis not present

## 2022-09-11 DIAGNOSIS — Z992 Dependence on renal dialysis: Secondary | ICD-10-CM | POA: Diagnosis not present

## 2022-09-11 DIAGNOSIS — N2581 Secondary hyperparathyroidism of renal origin: Secondary | ICD-10-CM | POA: Diagnosis not present

## 2022-09-13 DIAGNOSIS — Z992 Dependence on renal dialysis: Secondary | ICD-10-CM | POA: Diagnosis not present

## 2022-09-13 DIAGNOSIS — D631 Anemia in chronic kidney disease: Secondary | ICD-10-CM | POA: Diagnosis not present

## 2022-09-13 DIAGNOSIS — N2581 Secondary hyperparathyroidism of renal origin: Secondary | ICD-10-CM | POA: Diagnosis not present

## 2022-09-13 DIAGNOSIS — N186 End stage renal disease: Secondary | ICD-10-CM | POA: Diagnosis not present

## 2022-09-16 DIAGNOSIS — N186 End stage renal disease: Secondary | ICD-10-CM | POA: Diagnosis not present

## 2022-09-16 DIAGNOSIS — N2581 Secondary hyperparathyroidism of renal origin: Secondary | ICD-10-CM | POA: Diagnosis not present

## 2022-09-16 DIAGNOSIS — D631 Anemia in chronic kidney disease: Secondary | ICD-10-CM | POA: Diagnosis not present

## 2022-09-16 DIAGNOSIS — Z992 Dependence on renal dialysis: Secondary | ICD-10-CM | POA: Diagnosis not present

## 2022-09-18 DIAGNOSIS — Z992 Dependence on renal dialysis: Secondary | ICD-10-CM | POA: Diagnosis not present

## 2022-09-18 DIAGNOSIS — N186 End stage renal disease: Secondary | ICD-10-CM | POA: Diagnosis not present

## 2022-09-18 DIAGNOSIS — N2581 Secondary hyperparathyroidism of renal origin: Secondary | ICD-10-CM | POA: Diagnosis not present

## 2022-09-18 DIAGNOSIS — D631 Anemia in chronic kidney disease: Secondary | ICD-10-CM | POA: Diagnosis not present

## 2022-09-20 DIAGNOSIS — D631 Anemia in chronic kidney disease: Secondary | ICD-10-CM | POA: Diagnosis not present

## 2022-09-20 DIAGNOSIS — Z992 Dependence on renal dialysis: Secondary | ICD-10-CM | POA: Diagnosis not present

## 2022-09-20 DIAGNOSIS — N2581 Secondary hyperparathyroidism of renal origin: Secondary | ICD-10-CM | POA: Diagnosis not present

## 2022-09-20 DIAGNOSIS — N186 End stage renal disease: Secondary | ICD-10-CM | POA: Diagnosis not present

## 2022-09-23 DIAGNOSIS — N2581 Secondary hyperparathyroidism of renal origin: Secondary | ICD-10-CM | POA: Diagnosis not present

## 2022-09-23 DIAGNOSIS — R051 Acute cough: Secondary | ICD-10-CM | POA: Diagnosis not present

## 2022-09-23 DIAGNOSIS — U071 COVID-19: Secondary | ICD-10-CM | POA: Diagnosis not present

## 2022-09-23 DIAGNOSIS — I5031 Acute diastolic (congestive) heart failure: Secondary | ICD-10-CM | POA: Diagnosis not present

## 2022-09-23 DIAGNOSIS — N186 End stage renal disease: Secondary | ICD-10-CM | POA: Diagnosis not present

## 2022-09-23 DIAGNOSIS — Z992 Dependence on renal dialysis: Secondary | ICD-10-CM | POA: Diagnosis not present

## 2022-09-23 DIAGNOSIS — D631 Anemia in chronic kidney disease: Secondary | ICD-10-CM | POA: Diagnosis not present

## 2022-09-25 DIAGNOSIS — D631 Anemia in chronic kidney disease: Secondary | ICD-10-CM | POA: Diagnosis not present

## 2022-09-25 DIAGNOSIS — Z992 Dependence on renal dialysis: Secondary | ICD-10-CM | POA: Diagnosis not present

## 2022-09-25 DIAGNOSIS — U071 COVID-19: Secondary | ICD-10-CM | POA: Diagnosis not present

## 2022-09-25 DIAGNOSIS — N2581 Secondary hyperparathyroidism of renal origin: Secondary | ICD-10-CM | POA: Diagnosis not present

## 2022-09-25 DIAGNOSIS — N186 End stage renal disease: Secondary | ICD-10-CM | POA: Diagnosis not present

## 2022-09-27 DIAGNOSIS — D631 Anemia in chronic kidney disease: Secondary | ICD-10-CM | POA: Diagnosis not present

## 2022-09-27 DIAGNOSIS — N2581 Secondary hyperparathyroidism of renal origin: Secondary | ICD-10-CM | POA: Diagnosis not present

## 2022-09-27 DIAGNOSIS — Z992 Dependence on renal dialysis: Secondary | ICD-10-CM | POA: Diagnosis not present

## 2022-09-27 DIAGNOSIS — N186 End stage renal disease: Secondary | ICD-10-CM | POA: Diagnosis not present

## 2022-09-30 DIAGNOSIS — D631 Anemia in chronic kidney disease: Secondary | ICD-10-CM | POA: Diagnosis not present

## 2022-09-30 DIAGNOSIS — N2581 Secondary hyperparathyroidism of renal origin: Secondary | ICD-10-CM | POA: Diagnosis not present

## 2022-09-30 DIAGNOSIS — Z992 Dependence on renal dialysis: Secondary | ICD-10-CM | POA: Diagnosis not present

## 2022-09-30 DIAGNOSIS — N186 End stage renal disease: Secondary | ICD-10-CM | POA: Diagnosis not present

## 2022-10-02 DIAGNOSIS — D631 Anemia in chronic kidney disease: Secondary | ICD-10-CM | POA: Diagnosis not present

## 2022-10-02 DIAGNOSIS — Z992 Dependence on renal dialysis: Secondary | ICD-10-CM | POA: Diagnosis not present

## 2022-10-02 DIAGNOSIS — N186 End stage renal disease: Secondary | ICD-10-CM | POA: Diagnosis not present

## 2022-10-02 DIAGNOSIS — N2581 Secondary hyperparathyroidism of renal origin: Secondary | ICD-10-CM | POA: Diagnosis not present

## 2022-10-04 DIAGNOSIS — Z992 Dependence on renal dialysis: Secondary | ICD-10-CM | POA: Diagnosis not present

## 2022-10-04 DIAGNOSIS — N186 End stage renal disease: Secondary | ICD-10-CM | POA: Diagnosis not present

## 2022-10-04 DIAGNOSIS — I129 Hypertensive chronic kidney disease with stage 1 through stage 4 chronic kidney disease, or unspecified chronic kidney disease: Secondary | ICD-10-CM | POA: Diagnosis not present

## 2022-10-04 DIAGNOSIS — N2581 Secondary hyperparathyroidism of renal origin: Secondary | ICD-10-CM | POA: Diagnosis not present

## 2022-10-04 DIAGNOSIS — D631 Anemia in chronic kidney disease: Secondary | ICD-10-CM | POA: Diagnosis not present

## 2022-10-07 DIAGNOSIS — Z992 Dependence on renal dialysis: Secondary | ICD-10-CM | POA: Diagnosis not present

## 2022-10-07 DIAGNOSIS — N2581 Secondary hyperparathyroidism of renal origin: Secondary | ICD-10-CM | POA: Diagnosis not present

## 2022-10-07 DIAGNOSIS — N186 End stage renal disease: Secondary | ICD-10-CM | POA: Diagnosis not present

## 2022-10-07 DIAGNOSIS — D631 Anemia in chronic kidney disease: Secondary | ICD-10-CM | POA: Diagnosis not present

## 2022-10-09 DIAGNOSIS — N2581 Secondary hyperparathyroidism of renal origin: Secondary | ICD-10-CM | POA: Diagnosis not present

## 2022-10-09 DIAGNOSIS — D631 Anemia in chronic kidney disease: Secondary | ICD-10-CM | POA: Diagnosis not present

## 2022-10-09 DIAGNOSIS — Z992 Dependence on renal dialysis: Secondary | ICD-10-CM | POA: Diagnosis not present

## 2022-10-09 DIAGNOSIS — N186 End stage renal disease: Secondary | ICD-10-CM | POA: Diagnosis not present

## 2022-10-11 DIAGNOSIS — N186 End stage renal disease: Secondary | ICD-10-CM | POA: Diagnosis not present

## 2022-10-11 DIAGNOSIS — D631 Anemia in chronic kidney disease: Secondary | ICD-10-CM | POA: Diagnosis not present

## 2022-10-11 DIAGNOSIS — N2581 Secondary hyperparathyroidism of renal origin: Secondary | ICD-10-CM | POA: Diagnosis not present

## 2022-10-11 DIAGNOSIS — Z992 Dependence on renal dialysis: Secondary | ICD-10-CM | POA: Diagnosis not present

## 2022-10-14 DIAGNOSIS — N2581 Secondary hyperparathyroidism of renal origin: Secondary | ICD-10-CM | POA: Diagnosis not present

## 2022-10-14 DIAGNOSIS — D631 Anemia in chronic kidney disease: Secondary | ICD-10-CM | POA: Diagnosis not present

## 2022-10-14 DIAGNOSIS — Z992 Dependence on renal dialysis: Secondary | ICD-10-CM | POA: Diagnosis not present

## 2022-10-14 DIAGNOSIS — N186 End stage renal disease: Secondary | ICD-10-CM | POA: Diagnosis not present

## 2022-10-16 DIAGNOSIS — N186 End stage renal disease: Secondary | ICD-10-CM | POA: Diagnosis not present

## 2022-10-16 DIAGNOSIS — N2581 Secondary hyperparathyroidism of renal origin: Secondary | ICD-10-CM | POA: Diagnosis not present

## 2022-10-16 DIAGNOSIS — D631 Anemia in chronic kidney disease: Secondary | ICD-10-CM | POA: Diagnosis not present

## 2022-10-16 DIAGNOSIS — Z992 Dependence on renal dialysis: Secondary | ICD-10-CM | POA: Diagnosis not present

## 2022-10-17 DIAGNOSIS — H903 Sensorineural hearing loss, bilateral: Secondary | ICD-10-CM | POA: Diagnosis not present

## 2022-10-18 DIAGNOSIS — D631 Anemia in chronic kidney disease: Secondary | ICD-10-CM | POA: Diagnosis not present

## 2022-10-18 DIAGNOSIS — N186 End stage renal disease: Secondary | ICD-10-CM | POA: Diagnosis not present

## 2022-10-18 DIAGNOSIS — N2581 Secondary hyperparathyroidism of renal origin: Secondary | ICD-10-CM | POA: Diagnosis not present

## 2022-10-18 DIAGNOSIS — Z992 Dependence on renal dialysis: Secondary | ICD-10-CM | POA: Diagnosis not present

## 2022-10-21 DIAGNOSIS — Z992 Dependence on renal dialysis: Secondary | ICD-10-CM | POA: Diagnosis not present

## 2022-10-21 DIAGNOSIS — N186 End stage renal disease: Secondary | ICD-10-CM | POA: Diagnosis not present

## 2022-10-21 DIAGNOSIS — D631 Anemia in chronic kidney disease: Secondary | ICD-10-CM | POA: Diagnosis not present

## 2022-10-21 DIAGNOSIS — N2581 Secondary hyperparathyroidism of renal origin: Secondary | ICD-10-CM | POA: Diagnosis not present

## 2022-10-23 DIAGNOSIS — D631 Anemia in chronic kidney disease: Secondary | ICD-10-CM | POA: Diagnosis not present

## 2022-10-23 DIAGNOSIS — N186 End stage renal disease: Secondary | ICD-10-CM | POA: Diagnosis not present

## 2022-10-23 DIAGNOSIS — Z992 Dependence on renal dialysis: Secondary | ICD-10-CM | POA: Diagnosis not present

## 2022-10-23 DIAGNOSIS — N2581 Secondary hyperparathyroidism of renal origin: Secondary | ICD-10-CM | POA: Diagnosis not present

## 2022-10-25 DIAGNOSIS — D631 Anemia in chronic kidney disease: Secondary | ICD-10-CM | POA: Diagnosis not present

## 2022-10-25 DIAGNOSIS — N2581 Secondary hyperparathyroidism of renal origin: Secondary | ICD-10-CM | POA: Diagnosis not present

## 2022-10-25 DIAGNOSIS — N186 End stage renal disease: Secondary | ICD-10-CM | POA: Diagnosis not present

## 2022-10-25 DIAGNOSIS — Z992 Dependence on renal dialysis: Secondary | ICD-10-CM | POA: Diagnosis not present

## 2022-10-28 DIAGNOSIS — D631 Anemia in chronic kidney disease: Secondary | ICD-10-CM | POA: Diagnosis not present

## 2022-10-28 DIAGNOSIS — Z992 Dependence on renal dialysis: Secondary | ICD-10-CM | POA: Diagnosis not present

## 2022-10-28 DIAGNOSIS — N2581 Secondary hyperparathyroidism of renal origin: Secondary | ICD-10-CM | POA: Diagnosis not present

## 2022-10-28 DIAGNOSIS — N186 End stage renal disease: Secondary | ICD-10-CM | POA: Diagnosis not present

## 2022-10-30 DIAGNOSIS — Z992 Dependence on renal dialysis: Secondary | ICD-10-CM | POA: Diagnosis not present

## 2022-10-30 DIAGNOSIS — N2581 Secondary hyperparathyroidism of renal origin: Secondary | ICD-10-CM | POA: Diagnosis not present

## 2022-10-30 DIAGNOSIS — N186 End stage renal disease: Secondary | ICD-10-CM | POA: Diagnosis not present

## 2022-10-30 DIAGNOSIS — D631 Anemia in chronic kidney disease: Secondary | ICD-10-CM | POA: Diagnosis not present

## 2022-10-30 NOTE — Progress Notes (Signed)
Cardiology Office Note:  .   Date:  10/31/2022  ID:  Bary Richard, DOB 18-Oct-1928, MRN [id number] PCP: Alysia Penna, MD  [facility] Providers Cardiologist:  Chilton Si, MD Cardiology APP:  Alver Sorrow, NP    History of Present Illness: .   Yvonne Powell is a 89 y.o. female with past medical history of chronic diastolic heart failure, hypertension, hyperlipidemia, ESRD on HD (T, TH, SA), hypothyroidism and prior DVT.  She was admitted with acute on chronic shortness of breath and edema 09/2019.  Echo at that time revealed LVEF 50 to 55% with grade 1 diastolic dysfunction and a small pericardial effusion.  She was diuresed with IV Lasix and discharged on oral Lasix and she is an AV fistula in place was used for dialysis, follows with nephrology.  She was seen in office by Dr. Duke Salvia on 04/07/2022 was doing well from a cardiac perspective at that time, however was noting very labile blood pressure readings.  Previously had low blood pressure readings after HD sessions, however at that visit it was unusually high.  She was started on hydralazine to take as needed for sustained blood pressures greater than 120 and only take on her non-HD days.  She kept a blood pressure log and reported the readings as 141-160/59-83.   She presented for hypertension follow-up on 06/09/22.  She was checking her blood pressure on non-HD days readings were typically 120s to 130s.  She denied systolic blood pressures of greater than 160, as such had not needed to take hydralazine.  Noted to have low blood pressures at her HD session that is baseline.  Today she reports she is doing very well. She has not required hydralazine for elevated blood pressures, states she has not had any elevated blood pressures since last seeing Dr. Duke Salvia. She has been stable with dialysis, attending on Tuesday, Thursday, Saturday. She has been trying to walk daily, maintains renal and heart healthy diet. She denies  chest pain, shortness of breath, palpitations, dizziness or lightheadedness.   ROS: She denies chest pain, palpitations, dyspnea, pnd, orthopnea, n, v, dizziness, syncope, edema, weight gain, or early satiety. All other systems reviewed and are otherwise negative except as noted above.    Studies Reviewed: Marland Kitchen   EKG Interpretation Date/Time:  Friday October 31 2022 15:11:03 EDT Ventricular Rate:  86 PR Interval:  198 QRS Duration:  132 QT Interval:  424 QTC Calculation: 507 R Axis:   46  Text Interpretation: Normal sinus rhythm Left bundle branch block Stable compared to prior Reconfirmed by Reather Littler [phone]) on 10/31/2022 4:58:35 PM   Cardiac Studies & Procedures       ECHOCARDIOGRAM  ECHOCARDIOGRAM COMPLETE 09/08/2019  Narrative ECHOCARDIOGRAM REPORT    Patient Name:   Yvonne Powell Date of Exam: 09/08/2019 Medical Rec #:  [id number]      Height:       66.0 in Accession #:    [id number]     Weight:       119.0 lb Date of Birth:  [birth year]       BSA:          1.604 m Patient Age:    89 years       BP:           162/73 mmHg Patient Gender: F              HR:           71 bpm. Exam Location:  Inpatient  Procedure: 2D Echo  Indications:    CHF 428  History:        Patient has no prior history of Echocardiogram examinations. Risk Factors:Hypertension.  Sonographer:    Celene Skeen RDCS (AE) Referring Phys: [id number] DAVID MANUEL ORTIZ  IMPRESSIONS   1. Left ventricular ejection fraction, by estimation, is 50 to 55%. The left ventricle has low normal function. Septal-lateral dyssynchrony consistent with LBBB. There is moderate left ventricular hypertrophy. Left ventricular diastolic parameters are consistent with Grade I diastolic dysfunction (impaired relaxation). 2. Right ventricular systolic function is normal. The right ventricular size is normal. Tricuspid regurgitation signal is inadequate for assessing PA pressure. 3. Left atrial size was moderately dilated. 4. The  mitral valve is degenerative. Mild mitral valve regurgitation. No evidence of mitral stenosis. 5. The aortic valve is tricuspid. Aortic valve regurgitation is not visualized. Mild aortic valve sclerosis is present, with no evidence of aortic valve stenosis. 6. The inferior vena cava is dilated in size with <50% respiratory variability, suggesting right atrial pressure of 15 mmHg. 7. Small circumferential pericardial effusion, no evidence for tamponade. I do not think that the dilated IVC is related to the pericardial effusion.  FINDINGS Left Ventricle: Left ventricular ejection fraction, by estimation, is 50 to 55%. The left ventricle has low normal function. The left ventricle demonstrates regional wall motion abnormalities. The left ventricular internal cavity size was normal in size. There is moderate left ventricular hypertrophy. Left ventricular diastolic parameters are consistent with Grade I diastolic dysfunction (impaired relaxation).  Right Ventricle: The right ventricular size is normal. No increase in right ventricular wall thickness. Right ventricular systolic function is normal. Tricuspid regurgitation signal is inadequate for assessing PA pressure.  Left Atrium: Left atrial size was moderately dilated.  Right Atrium: Right atrial size was normal in size.  Pericardium: A small pericardial effusion is present.  Mitral Valve: The mitral valve is degenerative in appearance. There is mild calcification of the mitral valve leaflet(s). Moderate mitral annular calcification. Mild mitral valve regurgitation. No evidence of mitral valve stenosis. MV peak gradient, 13.1 mmHg. The mean mitral valve gradient is 8.0 mmHg.  Tricuspid Valve: The tricuspid valve is normal in structure. Tricuspid valve regurgitation is trivial.  Aortic Valve: The aortic valve is tricuspid. Aortic valve regurgitation is not visualized. Mild aortic valve sclerosis is present, with no evidence of aortic valve  stenosis.  Pulmonic Valve: The pulmonic valve was normal in structure. Pulmonic valve regurgitation is not visualized.  Aorta: The aortic root is normal in size and structure.  Venous: The inferior vena cava is dilated in size with less than 50% respiratory variability, suggesting right atrial pressure of 15 mmHg.  IAS/Shunts: No atrial level shunt detected by color flow Doppler.   LEFT VENTRICLE PLAX 2D LVIDd:         5.00 cm  Diastology LVIDs:         3.30 cm  LV e' lateral:   6.85 cm/s LV PW:         1.10 cm  LV E/e' lateral: 23.4 LV IVS:        1.50 cm LVOT diam:     1.90 cm LV SV:         112 LV SV Index:   70 LVOT Area:     2.84 cm   RIGHT VENTRICLE TAPSE (M-mode): 2.5 cm  LEFT ATRIUM             Index       RIGHT ATRIUM  Index LA diam:        4.40 cm 2.74 cm/m  RA Area:     17.50 cm LA Vol (A2C):   48.3 ml 30.12 ml/m RA Volume:   48.90 ml  30.49 ml/m LA Vol (A4C):   91.1 ml 56.77 ml/m LA Biplane Vol: 53.7 ml 33.48 ml/m AORTIC VALVE LVOT Vmax:   147.00 cm/s LVOT Vmean:  110.000 cm/s LVOT VTI:    0.394 m  AORTA Ao Root diam: 3.10 cm  MITRAL VALVE MV Area (PHT): 5.13 cm     SHUNTS MV Peak grad:  13.1 mmHg    Systemic VTI:  0.39 m MV Mean grad:  8.0 mmHg     Systemic Diam: 1.90 cm MV Vmax:       1.81 m/s MV Vmean:      134.0 cm/s MV Decel Time: 148 msec MR Peak grad: 127.7 mmHg MR Mean grad: 79.0 mmHg MR Vmax:      565.00 cm/s MR Vmean:     423.0 cm/s MV E velocity: 160.00 cm/s MV A velocity: 162.00 cm/s MV E/A ratio:  0.99  Marca Ancona MD Electronically signed by Marca Ancona MD Signature Date/Time: 09/08/2019/5:55:06 PM    Final             Physical Exam:   VS:  BP 128/68   Pulse 86   Ht 5\' 4"  (1.626 m)   Wt 111 lb (50.3 kg)   BMI 19.05 kg/m    Wt Readings from Last 3 Encounters:  10/31/22 111 lb (50.3 kg)  06/09/22 114 lb (51.7 kg)  04/07/22 116 lb 9.6 oz (52.9 kg)  GEN: Well nourished, well developed in no acute  distress NECK: No JVD; No carotid bruits CARDIAC: RRR, no murmurs, rubs, gallops RESPIRATORY:  Clear to auscultation without rales, wheezing or rhonchi  ABDOMEN: Soft, non-tender, non-distended EXTREMITIES:  No edema; No deformity, fistula in right upper extremity  ASSESSMENT AND PLAN: .    HTN: BP today 128/68, states this is about what she averages at home on non dialysis days. Reports she has ever needed to take hydralazine. Continue hydralazine as needed on non dialysis days for systolic BP greater than 160.  Chronic diastolic HF: Euvolemic and well compensated on exam today. Denies shortness of breath, weight gain or lower extremity edema. Volume management per HD.   ESRD on HD: Managed by nephrology. Scheduled for HD on Tuesday, Thursday and Saturday. Fistula in right arm, thrill and bruit present.   LBBB: Stable on EKG today.    Dispo: Follow up with Dr. Duke Salvia in 6 months.   Signed, Rip Harbour, NP

## 2022-10-31 ENCOUNTER — Encounter (HOSPITAL_BASED_OUTPATIENT_CLINIC_OR_DEPARTMENT_OTHER): Payer: Self-pay | Admitting: Cardiology

## 2022-10-31 ENCOUNTER — Ambulatory Visit (INDEPENDENT_AMBULATORY_CARE_PROVIDER_SITE_OTHER): Payer: Medicare Other | Admitting: Cardiology

## 2022-10-31 VITALS — BP 128/68 | HR 86 | Ht 64.0 in | Wt 111.0 lb

## 2022-10-31 DIAGNOSIS — I10 Essential (primary) hypertension: Secondary | ICD-10-CM | POA: Diagnosis not present

## 2022-10-31 DIAGNOSIS — I447 Left bundle-branch block, unspecified: Secondary | ICD-10-CM | POA: Diagnosis not present

## 2022-10-31 DIAGNOSIS — I5032 Chronic diastolic (congestive) heart failure: Secondary | ICD-10-CM

## 2022-10-31 DIAGNOSIS — N186 End stage renal disease: Secondary | ICD-10-CM | POA: Diagnosis not present

## 2022-10-31 DIAGNOSIS — Z992 Dependence on renal dialysis: Secondary | ICD-10-CM

## 2022-10-31 MED ORDER — HYDRALAZINE HCL 25 MG PO TABS: ORAL_TABLET | ORAL | 3 refills | Status: AC

## 2022-10-31 NOTE — Patient Instructions (Signed)
Medication Instructions:  Your physician recommends that you continue on your current medications as directed. Please refer to the Current Medication list given to you today.  *If you need a refill on your cardiac medications before your next appointment, please call your pharmacy*  Follow-Up: At [facility], you and your health needs are our priority.  As part of our continuing mission to provide you with exceptional heart care, we have created designated Provider Care Teams.  These Care Teams include your primary Cardiologist (physician) and Advanced Practice Providers (APPs -  Physician Assistants and Nurse Practitioners) who all work together to provide you with the care you need, when you need it.  We recommend signing up for the patient portal called "MyChart".  Sign up information is provided on this After Visit Summary.  MyChart is used to connect with patients for Virtual Visits (Telemedicine).  Patients are able to view lab/test results, encounter notes, upcoming appointments, etc.  Non-urgent messages can be sent to your provider as well.   To learn more about what you can do with MyChart, go to [email or web address].    Your next appointment:   6-8 months with Dr. Duke Salvia

## 2022-11-01 DIAGNOSIS — D631 Anemia in chronic kidney disease: Secondary | ICD-10-CM | POA: Diagnosis not present

## 2022-11-01 DIAGNOSIS — Z992 Dependence on renal dialysis: Secondary | ICD-10-CM | POA: Diagnosis not present

## 2022-11-01 DIAGNOSIS — N186 End stage renal disease: Secondary | ICD-10-CM | POA: Diagnosis not present

## 2022-11-01 DIAGNOSIS — N2581 Secondary hyperparathyroidism of renal origin: Secondary | ICD-10-CM | POA: Diagnosis not present

## 2022-11-03 DIAGNOSIS — N185 Chronic kidney disease, stage 5: Secondary | ICD-10-CM | POA: Diagnosis not present

## 2022-11-03 DIAGNOSIS — I120 Hypertensive chronic kidney disease with stage 5 chronic kidney disease or end stage renal disease: Secondary | ICD-10-CM | POA: Diagnosis not present

## 2022-11-03 DIAGNOSIS — Z992 Dependence on renal dialysis: Secondary | ICD-10-CM | POA: Diagnosis not present

## 2022-11-03 DIAGNOSIS — D638 Anemia in other chronic diseases classified elsewhere: Secondary | ICD-10-CM | POA: Diagnosis not present

## 2022-11-03 DIAGNOSIS — M810 Age-related osteoporosis without current pathological fracture: Secondary | ICD-10-CM | POA: Diagnosis not present

## 2022-11-03 DIAGNOSIS — E039 Hypothyroidism, unspecified: Secondary | ICD-10-CM | POA: Diagnosis not present

## 2022-11-03 DIAGNOSIS — I129 Hypertensive chronic kidney disease with stage 1 through stage 4 chronic kidney disease, or unspecified chronic kidney disease: Secondary | ICD-10-CM | POA: Diagnosis not present

## 2022-11-03 DIAGNOSIS — E785 Hyperlipidemia, unspecified: Secondary | ICD-10-CM | POA: Diagnosis not present

## 2022-11-03 DIAGNOSIS — N186 End stage renal disease: Secondary | ICD-10-CM | POA: Diagnosis not present

## 2022-11-04 DIAGNOSIS — Z992 Dependence on renal dialysis: Secondary | ICD-10-CM | POA: Diagnosis not present

## 2022-11-04 DIAGNOSIS — N2581 Secondary hyperparathyroidism of renal origin: Secondary | ICD-10-CM | POA: Diagnosis not present

## 2022-11-04 DIAGNOSIS — D631 Anemia in chronic kidney disease: Secondary | ICD-10-CM | POA: Diagnosis not present

## 2022-11-04 DIAGNOSIS — N186 End stage renal disease: Secondary | ICD-10-CM | POA: Diagnosis not present

## 2022-11-06 DIAGNOSIS — Z992 Dependence on renal dialysis: Secondary | ICD-10-CM | POA: Diagnosis not present

## 2022-11-06 DIAGNOSIS — N186 End stage renal disease: Secondary | ICD-10-CM | POA: Diagnosis not present

## 2022-11-06 DIAGNOSIS — N2581 Secondary hyperparathyroidism of renal origin: Secondary | ICD-10-CM | POA: Diagnosis not present

## 2022-11-06 DIAGNOSIS — D631 Anemia in chronic kidney disease: Secondary | ICD-10-CM | POA: Diagnosis not present

## 2022-11-08 DIAGNOSIS — Z992 Dependence on renal dialysis: Secondary | ICD-10-CM | POA: Diagnosis not present

## 2022-11-08 DIAGNOSIS — N2581 Secondary hyperparathyroidism of renal origin: Secondary | ICD-10-CM | POA: Diagnosis not present

## 2022-11-08 DIAGNOSIS — N186 End stage renal disease: Secondary | ICD-10-CM | POA: Diagnosis not present

## 2022-11-08 DIAGNOSIS — D631 Anemia in chronic kidney disease: Secondary | ICD-10-CM | POA: Diagnosis not present

## 2022-11-11 DIAGNOSIS — N186 End stage renal disease: Secondary | ICD-10-CM | POA: Diagnosis not present

## 2022-11-11 DIAGNOSIS — Z992 Dependence on renal dialysis: Secondary | ICD-10-CM | POA: Diagnosis not present

## 2022-11-11 DIAGNOSIS — D631 Anemia in chronic kidney disease: Secondary | ICD-10-CM | POA: Diagnosis not present

## 2022-11-11 DIAGNOSIS — N2581 Secondary hyperparathyroidism of renal origin: Secondary | ICD-10-CM | POA: Diagnosis not present

## 2022-11-12 DIAGNOSIS — I10 Essential (primary) hypertension: Secondary | ICD-10-CM | POA: Diagnosis not present

## 2022-11-12 DIAGNOSIS — K219 Gastro-esophageal reflux disease without esophagitis: Secondary | ICD-10-CM | POA: Diagnosis not present

## 2022-11-12 DIAGNOSIS — I5032 Chronic diastolic (congestive) heart failure: Secondary | ICD-10-CM | POA: Diagnosis not present

## 2022-11-12 DIAGNOSIS — N2581 Secondary hyperparathyroidism of renal origin: Secondary | ICD-10-CM | POA: Diagnosis not present

## 2022-11-12 DIAGNOSIS — N186 End stage renal disease: Secondary | ICD-10-CM | POA: Diagnosis not present

## 2022-11-12 DIAGNOSIS — Z1339 Encounter for screening examination for other mental health and behavioral disorders: Secondary | ICD-10-CM | POA: Diagnosis not present

## 2022-11-12 DIAGNOSIS — D638 Anemia in other chronic diseases classified elsewhere: Secondary | ICD-10-CM | POA: Diagnosis not present

## 2022-11-12 DIAGNOSIS — Z0000 Encounter for general adult medical examination without abnormal findings: Secondary | ICD-10-CM | POA: Diagnosis not present

## 2022-11-12 DIAGNOSIS — I132 Hypertensive heart and chronic kidney disease with heart failure and with stage 5 chronic kidney disease, or end stage renal disease: Secondary | ICD-10-CM | POA: Diagnosis not present

## 2022-11-12 DIAGNOSIS — E785 Hyperlipidemia, unspecified: Secondary | ICD-10-CM | POA: Diagnosis not present

## 2022-11-12 DIAGNOSIS — E039 Hypothyroidism, unspecified: Secondary | ICD-10-CM | POA: Diagnosis not present

## 2022-11-12 DIAGNOSIS — R82998 Other abnormal findings in urine: Secondary | ICD-10-CM | POA: Diagnosis not present

## 2022-11-12 DIAGNOSIS — Z1331 Encounter for screening for depression: Secondary | ICD-10-CM | POA: Diagnosis not present

## 2022-11-12 DIAGNOSIS — Z992 Dependence on renal dialysis: Secondary | ICD-10-CM | POA: Diagnosis not present

## 2022-11-13 DIAGNOSIS — D631 Anemia in chronic kidney disease: Secondary | ICD-10-CM | POA: Diagnosis not present

## 2022-11-13 DIAGNOSIS — Z992 Dependence on renal dialysis: Secondary | ICD-10-CM | POA: Diagnosis not present

## 2022-11-13 DIAGNOSIS — N2581 Secondary hyperparathyroidism of renal origin: Secondary | ICD-10-CM | POA: Diagnosis not present

## 2022-11-13 DIAGNOSIS — N186 End stage renal disease: Secondary | ICD-10-CM | POA: Diagnosis not present

## 2022-11-15 DIAGNOSIS — N2581 Secondary hyperparathyroidism of renal origin: Secondary | ICD-10-CM | POA: Diagnosis not present

## 2022-11-15 DIAGNOSIS — N186 End stage renal disease: Secondary | ICD-10-CM | POA: Diagnosis not present

## 2022-11-15 DIAGNOSIS — Z992 Dependence on renal dialysis: Secondary | ICD-10-CM | POA: Diagnosis not present

## 2022-11-15 DIAGNOSIS — D631 Anemia in chronic kidney disease: Secondary | ICD-10-CM | POA: Diagnosis not present

## 2022-11-18 DIAGNOSIS — N186 End stage renal disease: Secondary | ICD-10-CM | POA: Diagnosis not present

## 2022-11-18 DIAGNOSIS — Z992 Dependence on renal dialysis: Secondary | ICD-10-CM | POA: Diagnosis not present

## 2022-11-18 DIAGNOSIS — D631 Anemia in chronic kidney disease: Secondary | ICD-10-CM | POA: Diagnosis not present

## 2022-11-18 DIAGNOSIS — N2581 Secondary hyperparathyroidism of renal origin: Secondary | ICD-10-CM | POA: Diagnosis not present

## 2022-11-20 DIAGNOSIS — D631 Anemia in chronic kidney disease: Secondary | ICD-10-CM | POA: Diagnosis not present

## 2022-11-20 DIAGNOSIS — N186 End stage renal disease: Secondary | ICD-10-CM | POA: Diagnosis not present

## 2022-11-20 DIAGNOSIS — N2581 Secondary hyperparathyroidism of renal origin: Secondary | ICD-10-CM | POA: Diagnosis not present

## 2022-11-20 DIAGNOSIS — Z992 Dependence on renal dialysis: Secondary | ICD-10-CM | POA: Diagnosis not present

## 2022-11-22 DIAGNOSIS — Z992 Dependence on renal dialysis: Secondary | ICD-10-CM | POA: Diagnosis not present

## 2022-11-22 DIAGNOSIS — N2581 Secondary hyperparathyroidism of renal origin: Secondary | ICD-10-CM | POA: Diagnosis not present

## 2022-11-22 DIAGNOSIS — N186 End stage renal disease: Secondary | ICD-10-CM | POA: Diagnosis not present

## 2022-11-22 DIAGNOSIS — D631 Anemia in chronic kidney disease: Secondary | ICD-10-CM | POA: Diagnosis not present

## 2022-11-25 DIAGNOSIS — D631 Anemia in chronic kidney disease: Secondary | ICD-10-CM | POA: Diagnosis not present

## 2022-11-25 DIAGNOSIS — N2581 Secondary hyperparathyroidism of renal origin: Secondary | ICD-10-CM | POA: Diagnosis not present

## 2022-11-25 DIAGNOSIS — Z992 Dependence on renal dialysis: Secondary | ICD-10-CM | POA: Diagnosis not present

## 2022-11-25 DIAGNOSIS — N186 End stage renal disease: Secondary | ICD-10-CM | POA: Diagnosis not present

## 2022-11-27 DIAGNOSIS — N2581 Secondary hyperparathyroidism of renal origin: Secondary | ICD-10-CM | POA: Diagnosis not present

## 2022-11-27 DIAGNOSIS — D631 Anemia in chronic kidney disease: Secondary | ICD-10-CM | POA: Diagnosis not present

## 2022-11-27 DIAGNOSIS — Z992 Dependence on renal dialysis: Secondary | ICD-10-CM | POA: Diagnosis not present

## 2022-11-27 DIAGNOSIS — N186 End stage renal disease: Secondary | ICD-10-CM | POA: Diagnosis not present

## 2022-11-29 DIAGNOSIS — Z992 Dependence on renal dialysis: Secondary | ICD-10-CM | POA: Diagnosis not present

## 2022-11-29 DIAGNOSIS — N186 End stage renal disease: Secondary | ICD-10-CM | POA: Diagnosis not present

## 2022-11-29 DIAGNOSIS — N2581 Secondary hyperparathyroidism of renal origin: Secondary | ICD-10-CM | POA: Diagnosis not present

## 2022-11-29 DIAGNOSIS — D631 Anemia in chronic kidney disease: Secondary | ICD-10-CM | POA: Diagnosis not present

## 2022-12-02 DIAGNOSIS — Z992 Dependence on renal dialysis: Secondary | ICD-10-CM | POA: Diagnosis not present

## 2022-12-02 DIAGNOSIS — N186 End stage renal disease: Secondary | ICD-10-CM | POA: Diagnosis not present

## 2022-12-02 DIAGNOSIS — N2581 Secondary hyperparathyroidism of renal origin: Secondary | ICD-10-CM | POA: Diagnosis not present

## 2022-12-02 DIAGNOSIS — D631 Anemia in chronic kidney disease: Secondary | ICD-10-CM | POA: Diagnosis not present

## 2022-12-04 DIAGNOSIS — Z992 Dependence on renal dialysis: Secondary | ICD-10-CM | POA: Diagnosis not present

## 2022-12-04 DIAGNOSIS — N2581 Secondary hyperparathyroidism of renal origin: Secondary | ICD-10-CM | POA: Diagnosis not present

## 2022-12-04 DIAGNOSIS — I129 Hypertensive chronic kidney disease with stage 1 through stage 4 chronic kidney disease, or unspecified chronic kidney disease: Secondary | ICD-10-CM | POA: Diagnosis not present

## 2022-12-04 DIAGNOSIS — N186 End stage renal disease: Secondary | ICD-10-CM | POA: Diagnosis not present

## 2022-12-06 DIAGNOSIS — N186 End stage renal disease: Secondary | ICD-10-CM | POA: Diagnosis not present

## 2022-12-06 DIAGNOSIS — N2581 Secondary hyperparathyroidism of renal origin: Secondary | ICD-10-CM | POA: Diagnosis not present

## 2022-12-06 DIAGNOSIS — Z992 Dependence on renal dialysis: Secondary | ICD-10-CM | POA: Diagnosis not present

## 2022-12-09 DIAGNOSIS — Z992 Dependence on renal dialysis: Secondary | ICD-10-CM | POA: Diagnosis not present

## 2022-12-09 DIAGNOSIS — N186 End stage renal disease: Secondary | ICD-10-CM | POA: Diagnosis not present

## 2022-12-09 DIAGNOSIS — N2581 Secondary hyperparathyroidism of renal origin: Secondary | ICD-10-CM | POA: Diagnosis not present

## 2022-12-11 DIAGNOSIS — N186 End stage renal disease: Secondary | ICD-10-CM | POA: Diagnosis not present

## 2022-12-11 DIAGNOSIS — Z992 Dependence on renal dialysis: Secondary | ICD-10-CM | POA: Diagnosis not present

## 2022-12-11 DIAGNOSIS — N2581 Secondary hyperparathyroidism of renal origin: Secondary | ICD-10-CM | POA: Diagnosis not present

## 2022-12-13 DIAGNOSIS — N186 End stage renal disease: Secondary | ICD-10-CM | POA: Diagnosis not present

## 2022-12-13 DIAGNOSIS — Z992 Dependence on renal dialysis: Secondary | ICD-10-CM | POA: Diagnosis not present

## 2022-12-13 DIAGNOSIS — N2581 Secondary hyperparathyroidism of renal origin: Secondary | ICD-10-CM | POA: Diagnosis not present

## 2022-12-16 DIAGNOSIS — N186 End stage renal disease: Secondary | ICD-10-CM | POA: Diagnosis not present

## 2022-12-16 DIAGNOSIS — N2581 Secondary hyperparathyroidism of renal origin: Secondary | ICD-10-CM | POA: Diagnosis not present

## 2022-12-16 DIAGNOSIS — Z992 Dependence on renal dialysis: Secondary | ICD-10-CM | POA: Diagnosis not present

## 2022-12-18 DIAGNOSIS — N186 End stage renal disease: Secondary | ICD-10-CM | POA: Diagnosis not present

## 2022-12-18 DIAGNOSIS — Z992 Dependence on renal dialysis: Secondary | ICD-10-CM | POA: Diagnosis not present

## 2022-12-18 DIAGNOSIS — N2581 Secondary hyperparathyroidism of renal origin: Secondary | ICD-10-CM | POA: Diagnosis not present

## 2022-12-20 DIAGNOSIS — N2581 Secondary hyperparathyroidism of renal origin: Secondary | ICD-10-CM | POA: Diagnosis not present

## 2022-12-20 DIAGNOSIS — N186 End stage renal disease: Secondary | ICD-10-CM | POA: Diagnosis not present

## 2022-12-20 DIAGNOSIS — Z992 Dependence on renal dialysis: Secondary | ICD-10-CM | POA: Diagnosis not present

## 2022-12-23 DIAGNOSIS — Z992 Dependence on renal dialysis: Secondary | ICD-10-CM | POA: Diagnosis not present

## 2022-12-23 DIAGNOSIS — N2581 Secondary hyperparathyroidism of renal origin: Secondary | ICD-10-CM | POA: Diagnosis not present

## 2022-12-23 DIAGNOSIS — N186 End stage renal disease: Secondary | ICD-10-CM | POA: Diagnosis not present

## 2022-12-25 DIAGNOSIS — N186 End stage renal disease: Secondary | ICD-10-CM | POA: Diagnosis not present

## 2022-12-25 DIAGNOSIS — N2581 Secondary hyperparathyroidism of renal origin: Secondary | ICD-10-CM | POA: Diagnosis not present

## 2022-12-25 DIAGNOSIS — Z992 Dependence on renal dialysis: Secondary | ICD-10-CM | POA: Diagnosis not present

## 2022-12-27 DIAGNOSIS — Z992 Dependence on renal dialysis: Secondary | ICD-10-CM | POA: Diagnosis not present

## 2022-12-27 DIAGNOSIS — N186 End stage renal disease: Secondary | ICD-10-CM | POA: Diagnosis not present

## 2022-12-27 DIAGNOSIS — N2581 Secondary hyperparathyroidism of renal origin: Secondary | ICD-10-CM | POA: Diagnosis not present

## 2022-12-30 DIAGNOSIS — N2581 Secondary hyperparathyroidism of renal origin: Secondary | ICD-10-CM | POA: Diagnosis not present

## 2022-12-30 DIAGNOSIS — Z992 Dependence on renal dialysis: Secondary | ICD-10-CM | POA: Diagnosis not present

## 2022-12-30 DIAGNOSIS — N186 End stage renal disease: Secondary | ICD-10-CM | POA: Diagnosis not present

## 2023-01-01 DIAGNOSIS — N2581 Secondary hyperparathyroidism of renal origin: Secondary | ICD-10-CM | POA: Diagnosis not present

## 2023-01-01 DIAGNOSIS — Z992 Dependence on renal dialysis: Secondary | ICD-10-CM | POA: Diagnosis not present

## 2023-01-01 DIAGNOSIS — N186 End stage renal disease: Secondary | ICD-10-CM | POA: Diagnosis not present

## 2023-01-03 DIAGNOSIS — N186 End stage renal disease: Secondary | ICD-10-CM | POA: Diagnosis not present

## 2023-01-03 DIAGNOSIS — N2581 Secondary hyperparathyroidism of renal origin: Secondary | ICD-10-CM | POA: Diagnosis not present

## 2023-01-03 DIAGNOSIS — Z992 Dependence on renal dialysis: Secondary | ICD-10-CM | POA: Diagnosis not present

## 2023-01-04 DIAGNOSIS — N186 End stage renal disease: Secondary | ICD-10-CM | POA: Diagnosis not present

## 2023-01-04 DIAGNOSIS — I129 Hypertensive chronic kidney disease with stage 1 through stage 4 chronic kidney disease, or unspecified chronic kidney disease: Secondary | ICD-10-CM | POA: Diagnosis not present

## 2023-01-04 DIAGNOSIS — Z992 Dependence on renal dialysis: Secondary | ICD-10-CM | POA: Diagnosis not present

## 2023-01-06 DIAGNOSIS — N2581 Secondary hyperparathyroidism of renal origin: Secondary | ICD-10-CM | POA: Diagnosis not present

## 2023-01-06 DIAGNOSIS — N186 End stage renal disease: Secondary | ICD-10-CM | POA: Diagnosis not present

## 2023-01-06 DIAGNOSIS — D631 Anemia in chronic kidney disease: Secondary | ICD-10-CM | POA: Diagnosis not present

## 2023-01-06 DIAGNOSIS — Z992 Dependence on renal dialysis: Secondary | ICD-10-CM | POA: Diagnosis not present

## 2023-01-08 DIAGNOSIS — Z992 Dependence on renal dialysis: Secondary | ICD-10-CM | POA: Diagnosis not present

## 2023-01-08 DIAGNOSIS — D631 Anemia in chronic kidney disease: Secondary | ICD-10-CM | POA: Diagnosis not present

## 2023-01-08 DIAGNOSIS — N186 End stage renal disease: Secondary | ICD-10-CM | POA: Diagnosis not present

## 2023-01-08 DIAGNOSIS — N2581 Secondary hyperparathyroidism of renal origin: Secondary | ICD-10-CM | POA: Diagnosis not present

## 2023-01-10 DIAGNOSIS — N2581 Secondary hyperparathyroidism of renal origin: Secondary | ICD-10-CM | POA: Diagnosis not present

## 2023-01-10 DIAGNOSIS — D631 Anemia in chronic kidney disease: Secondary | ICD-10-CM | POA: Diagnosis not present

## 2023-01-10 DIAGNOSIS — N186 End stage renal disease: Secondary | ICD-10-CM | POA: Diagnosis not present

## 2023-01-10 DIAGNOSIS — Z992 Dependence on renal dialysis: Secondary | ICD-10-CM | POA: Diagnosis not present

## 2023-01-13 DIAGNOSIS — N2581 Secondary hyperparathyroidism of renal origin: Secondary | ICD-10-CM | POA: Diagnosis not present

## 2023-01-13 DIAGNOSIS — Z992 Dependence on renal dialysis: Secondary | ICD-10-CM | POA: Diagnosis not present

## 2023-01-13 DIAGNOSIS — D631 Anemia in chronic kidney disease: Secondary | ICD-10-CM | POA: Diagnosis not present

## 2023-01-13 DIAGNOSIS — N186 End stage renal disease: Secondary | ICD-10-CM | POA: Diagnosis not present

## 2023-01-15 DIAGNOSIS — N2581 Secondary hyperparathyroidism of renal origin: Secondary | ICD-10-CM | POA: Diagnosis not present

## 2023-01-15 DIAGNOSIS — Z992 Dependence on renal dialysis: Secondary | ICD-10-CM | POA: Diagnosis not present

## 2023-01-15 DIAGNOSIS — D631 Anemia in chronic kidney disease: Secondary | ICD-10-CM | POA: Diagnosis not present

## 2023-01-15 DIAGNOSIS — N186 End stage renal disease: Secondary | ICD-10-CM | POA: Diagnosis not present

## 2023-01-17 DIAGNOSIS — Z992 Dependence on renal dialysis: Secondary | ICD-10-CM | POA: Diagnosis not present

## 2023-01-17 DIAGNOSIS — N186 End stage renal disease: Secondary | ICD-10-CM | POA: Diagnosis not present

## 2023-01-17 DIAGNOSIS — D631 Anemia in chronic kidney disease: Secondary | ICD-10-CM | POA: Diagnosis not present

## 2023-01-17 DIAGNOSIS — N2581 Secondary hyperparathyroidism of renal origin: Secondary | ICD-10-CM | POA: Diagnosis not present

## 2023-01-19 DIAGNOSIS — H52203 Unspecified astigmatism, bilateral: Secondary | ICD-10-CM | POA: Diagnosis not present

## 2023-01-19 DIAGNOSIS — Z961 Presence of intraocular lens: Secondary | ICD-10-CM | POA: Diagnosis not present

## 2023-01-19 DIAGNOSIS — H349 Unspecified retinal vascular occlusion: Secondary | ICD-10-CM | POA: Diagnosis not present

## 2023-01-20 DIAGNOSIS — N186 End stage renal disease: Secondary | ICD-10-CM | POA: Diagnosis not present

## 2023-01-20 DIAGNOSIS — Z992 Dependence on renal dialysis: Secondary | ICD-10-CM | POA: Diagnosis not present

## 2023-01-20 DIAGNOSIS — N2581 Secondary hyperparathyroidism of renal origin: Secondary | ICD-10-CM | POA: Diagnosis not present

## 2023-01-20 DIAGNOSIS — D631 Anemia in chronic kidney disease: Secondary | ICD-10-CM | POA: Diagnosis not present

## 2023-01-22 DIAGNOSIS — N186 End stage renal disease: Secondary | ICD-10-CM | POA: Diagnosis not present

## 2023-01-22 DIAGNOSIS — N2581 Secondary hyperparathyroidism of renal origin: Secondary | ICD-10-CM | POA: Diagnosis not present

## 2023-01-22 DIAGNOSIS — D631 Anemia in chronic kidney disease: Secondary | ICD-10-CM | POA: Diagnosis not present

## 2023-01-22 DIAGNOSIS — Z992 Dependence on renal dialysis: Secondary | ICD-10-CM | POA: Diagnosis not present

## 2023-01-24 DIAGNOSIS — Z992 Dependence on renal dialysis: Secondary | ICD-10-CM | POA: Diagnosis not present

## 2023-01-24 DIAGNOSIS — N186 End stage renal disease: Secondary | ICD-10-CM | POA: Diagnosis not present

## 2023-01-24 DIAGNOSIS — N2581 Secondary hyperparathyroidism of renal origin: Secondary | ICD-10-CM | POA: Diagnosis not present

## 2023-01-24 DIAGNOSIS — D631 Anemia in chronic kidney disease: Secondary | ICD-10-CM | POA: Diagnosis not present

## 2023-01-27 DIAGNOSIS — N2581 Secondary hyperparathyroidism of renal origin: Secondary | ICD-10-CM | POA: Diagnosis not present

## 2023-01-27 DIAGNOSIS — Z992 Dependence on renal dialysis: Secondary | ICD-10-CM | POA: Diagnosis not present

## 2023-01-27 DIAGNOSIS — N186 End stage renal disease: Secondary | ICD-10-CM | POA: Diagnosis not present

## 2023-01-27 DIAGNOSIS — D631 Anemia in chronic kidney disease: Secondary | ICD-10-CM | POA: Diagnosis not present

## 2023-01-29 DIAGNOSIS — Z992 Dependence on renal dialysis: Secondary | ICD-10-CM | POA: Diagnosis not present

## 2023-01-29 DIAGNOSIS — N2581 Secondary hyperparathyroidism of renal origin: Secondary | ICD-10-CM | POA: Diagnosis not present

## 2023-01-29 DIAGNOSIS — D631 Anemia in chronic kidney disease: Secondary | ICD-10-CM | POA: Diagnosis not present

## 2023-01-29 DIAGNOSIS — N186 End stage renal disease: Secondary | ICD-10-CM | POA: Diagnosis not present

## 2023-01-31 DIAGNOSIS — N186 End stage renal disease: Secondary | ICD-10-CM | POA: Diagnosis not present

## 2023-01-31 DIAGNOSIS — D631 Anemia in chronic kidney disease: Secondary | ICD-10-CM | POA: Diagnosis not present

## 2023-01-31 DIAGNOSIS — Z992 Dependence on renal dialysis: Secondary | ICD-10-CM | POA: Diagnosis not present

## 2023-01-31 DIAGNOSIS — N2581 Secondary hyperparathyroidism of renal origin: Secondary | ICD-10-CM | POA: Diagnosis not present

## 2023-02-03 DIAGNOSIS — D631 Anemia in chronic kidney disease: Secondary | ICD-10-CM | POA: Diagnosis not present

## 2023-02-03 DIAGNOSIS — I129 Hypertensive chronic kidney disease with stage 1 through stage 4 chronic kidney disease, or unspecified chronic kidney disease: Secondary | ICD-10-CM | POA: Diagnosis not present

## 2023-02-03 DIAGNOSIS — N186 End stage renal disease: Secondary | ICD-10-CM | POA: Diagnosis not present

## 2023-02-03 DIAGNOSIS — N2581 Secondary hyperparathyroidism of renal origin: Secondary | ICD-10-CM | POA: Diagnosis not present

## 2023-02-03 DIAGNOSIS — Z992 Dependence on renal dialysis: Secondary | ICD-10-CM | POA: Diagnosis not present

## 2023-02-05 DIAGNOSIS — N2581 Secondary hyperparathyroidism of renal origin: Secondary | ICD-10-CM | POA: Diagnosis not present

## 2023-02-05 DIAGNOSIS — N186 End stage renal disease: Secondary | ICD-10-CM | POA: Diagnosis not present

## 2023-02-05 DIAGNOSIS — D631 Anemia in chronic kidney disease: Secondary | ICD-10-CM | POA: Diagnosis not present

## 2023-02-05 DIAGNOSIS — Z992 Dependence on renal dialysis: Secondary | ICD-10-CM | POA: Diagnosis not present

## 2023-02-07 DIAGNOSIS — D631 Anemia in chronic kidney disease: Secondary | ICD-10-CM | POA: Diagnosis not present

## 2023-02-07 DIAGNOSIS — Z992 Dependence on renal dialysis: Secondary | ICD-10-CM | POA: Diagnosis not present

## 2023-02-07 DIAGNOSIS — N2581 Secondary hyperparathyroidism of renal origin: Secondary | ICD-10-CM | POA: Diagnosis not present

## 2023-02-07 DIAGNOSIS — N186 End stage renal disease: Secondary | ICD-10-CM | POA: Diagnosis not present

## 2023-02-10 DIAGNOSIS — N2581 Secondary hyperparathyroidism of renal origin: Secondary | ICD-10-CM | POA: Diagnosis not present

## 2023-02-10 DIAGNOSIS — Z992 Dependence on renal dialysis: Secondary | ICD-10-CM | POA: Diagnosis not present

## 2023-02-10 DIAGNOSIS — D631 Anemia in chronic kidney disease: Secondary | ICD-10-CM | POA: Diagnosis not present

## 2023-02-10 DIAGNOSIS — N186 End stage renal disease: Secondary | ICD-10-CM | POA: Diagnosis not present

## 2023-02-12 DIAGNOSIS — N186 End stage renal disease: Secondary | ICD-10-CM | POA: Diagnosis not present

## 2023-02-12 DIAGNOSIS — N2581 Secondary hyperparathyroidism of renal origin: Secondary | ICD-10-CM | POA: Diagnosis not present

## 2023-02-12 DIAGNOSIS — Z992 Dependence on renal dialysis: Secondary | ICD-10-CM | POA: Diagnosis not present

## 2023-02-12 DIAGNOSIS — D631 Anemia in chronic kidney disease: Secondary | ICD-10-CM | POA: Diagnosis not present

## 2023-02-14 DIAGNOSIS — N2581 Secondary hyperparathyroidism of renal origin: Secondary | ICD-10-CM | POA: Diagnosis not present

## 2023-02-14 DIAGNOSIS — N186 End stage renal disease: Secondary | ICD-10-CM | POA: Diagnosis not present

## 2023-02-14 DIAGNOSIS — Z992 Dependence on renal dialysis: Secondary | ICD-10-CM | POA: Diagnosis not present

## 2023-02-14 DIAGNOSIS — D631 Anemia in chronic kidney disease: Secondary | ICD-10-CM | POA: Diagnosis not present

## 2023-02-17 DIAGNOSIS — N186 End stage renal disease: Secondary | ICD-10-CM | POA: Diagnosis not present

## 2023-02-17 DIAGNOSIS — D631 Anemia in chronic kidney disease: Secondary | ICD-10-CM | POA: Diagnosis not present

## 2023-02-17 DIAGNOSIS — Z992 Dependence on renal dialysis: Secondary | ICD-10-CM | POA: Diagnosis not present

## 2023-02-17 DIAGNOSIS — N2581 Secondary hyperparathyroidism of renal origin: Secondary | ICD-10-CM | POA: Diagnosis not present

## 2023-02-19 DIAGNOSIS — Z992 Dependence on renal dialysis: Secondary | ICD-10-CM | POA: Diagnosis not present

## 2023-02-19 DIAGNOSIS — N186 End stage renal disease: Secondary | ICD-10-CM | POA: Diagnosis not present

## 2023-02-19 DIAGNOSIS — D631 Anemia in chronic kidney disease: Secondary | ICD-10-CM | POA: Diagnosis not present

## 2023-02-19 DIAGNOSIS — N2581 Secondary hyperparathyroidism of renal origin: Secondary | ICD-10-CM | POA: Diagnosis not present

## 2023-02-21 DIAGNOSIS — N2581 Secondary hyperparathyroidism of renal origin: Secondary | ICD-10-CM | POA: Diagnosis not present

## 2023-02-21 DIAGNOSIS — D631 Anemia in chronic kidney disease: Secondary | ICD-10-CM | POA: Diagnosis not present

## 2023-02-21 DIAGNOSIS — N186 End stage renal disease: Secondary | ICD-10-CM | POA: Diagnosis not present

## 2023-02-21 DIAGNOSIS — Z992 Dependence on renal dialysis: Secondary | ICD-10-CM | POA: Diagnosis not present

## 2023-02-24 DIAGNOSIS — D631 Anemia in chronic kidney disease: Secondary | ICD-10-CM | POA: Diagnosis not present

## 2023-02-24 DIAGNOSIS — N186 End stage renal disease: Secondary | ICD-10-CM | POA: Diagnosis not present

## 2023-02-24 DIAGNOSIS — Z992 Dependence on renal dialysis: Secondary | ICD-10-CM | POA: Diagnosis not present

## 2023-02-24 DIAGNOSIS — N2581 Secondary hyperparathyroidism of renal origin: Secondary | ICD-10-CM | POA: Diagnosis not present

## 2023-02-26 DIAGNOSIS — Z992 Dependence on renal dialysis: Secondary | ICD-10-CM | POA: Diagnosis not present

## 2023-02-26 DIAGNOSIS — D631 Anemia in chronic kidney disease: Secondary | ICD-10-CM | POA: Diagnosis not present

## 2023-02-26 DIAGNOSIS — N2581 Secondary hyperparathyroidism of renal origin: Secondary | ICD-10-CM | POA: Diagnosis not present

## 2023-02-26 DIAGNOSIS — N186 End stage renal disease: Secondary | ICD-10-CM | POA: Diagnosis not present

## 2023-02-28 DIAGNOSIS — N186 End stage renal disease: Secondary | ICD-10-CM | POA: Diagnosis not present

## 2023-02-28 DIAGNOSIS — N2581 Secondary hyperparathyroidism of renal origin: Secondary | ICD-10-CM | POA: Diagnosis not present

## 2023-02-28 DIAGNOSIS — Z992 Dependence on renal dialysis: Secondary | ICD-10-CM | POA: Diagnosis not present

## 2023-02-28 DIAGNOSIS — D631 Anemia in chronic kidney disease: Secondary | ICD-10-CM | POA: Diagnosis not present

## 2023-03-03 DIAGNOSIS — D631 Anemia in chronic kidney disease: Secondary | ICD-10-CM | POA: Diagnosis not present

## 2023-03-03 DIAGNOSIS — N186 End stage renal disease: Secondary | ICD-10-CM | POA: Diagnosis not present

## 2023-03-03 DIAGNOSIS — N2581 Secondary hyperparathyroidism of renal origin: Secondary | ICD-10-CM | POA: Diagnosis not present

## 2023-03-03 DIAGNOSIS — Z992 Dependence on renal dialysis: Secondary | ICD-10-CM | POA: Diagnosis not present

## 2023-03-05 DIAGNOSIS — D631 Anemia in chronic kidney disease: Secondary | ICD-10-CM | POA: Diagnosis not present

## 2023-03-05 DIAGNOSIS — Z992 Dependence on renal dialysis: Secondary | ICD-10-CM | POA: Diagnosis not present

## 2023-03-05 DIAGNOSIS — N2581 Secondary hyperparathyroidism of renal origin: Secondary | ICD-10-CM | POA: Diagnosis not present

## 2023-03-05 DIAGNOSIS — N186 End stage renal disease: Secondary | ICD-10-CM | POA: Diagnosis not present

## 2023-03-06 DIAGNOSIS — Z992 Dependence on renal dialysis: Secondary | ICD-10-CM | POA: Diagnosis not present

## 2023-03-06 DIAGNOSIS — N186 End stage renal disease: Secondary | ICD-10-CM | POA: Diagnosis not present

## 2023-03-06 DIAGNOSIS — I129 Hypertensive chronic kidney disease with stage 1 through stage 4 chronic kidney disease, or unspecified chronic kidney disease: Secondary | ICD-10-CM | POA: Diagnosis not present

## 2023-03-07 DIAGNOSIS — N186 End stage renal disease: Secondary | ICD-10-CM | POA: Diagnosis not present

## 2023-03-07 DIAGNOSIS — N2581 Secondary hyperparathyroidism of renal origin: Secondary | ICD-10-CM | POA: Diagnosis not present

## 2023-03-07 DIAGNOSIS — Z992 Dependence on renal dialysis: Secondary | ICD-10-CM | POA: Diagnosis not present

## 2023-03-07 DIAGNOSIS — D631 Anemia in chronic kidney disease: Secondary | ICD-10-CM | POA: Diagnosis not present

## 2023-03-10 DIAGNOSIS — N2581 Secondary hyperparathyroidism of renal origin: Secondary | ICD-10-CM | POA: Diagnosis not present

## 2023-03-10 DIAGNOSIS — D631 Anemia in chronic kidney disease: Secondary | ICD-10-CM | POA: Diagnosis not present

## 2023-03-10 DIAGNOSIS — Z992 Dependence on renal dialysis: Secondary | ICD-10-CM | POA: Diagnosis not present

## 2023-03-10 DIAGNOSIS — N186 End stage renal disease: Secondary | ICD-10-CM | POA: Diagnosis not present

## 2023-03-12 DIAGNOSIS — N2581 Secondary hyperparathyroidism of renal origin: Secondary | ICD-10-CM | POA: Diagnosis not present

## 2023-03-12 DIAGNOSIS — Z992 Dependence on renal dialysis: Secondary | ICD-10-CM | POA: Diagnosis not present

## 2023-03-12 DIAGNOSIS — N186 End stage renal disease: Secondary | ICD-10-CM | POA: Diagnosis not present

## 2023-03-12 DIAGNOSIS — D631 Anemia in chronic kidney disease: Secondary | ICD-10-CM | POA: Diagnosis not present

## 2023-03-14 DIAGNOSIS — N2581 Secondary hyperparathyroidism of renal origin: Secondary | ICD-10-CM | POA: Diagnosis not present

## 2023-03-14 DIAGNOSIS — Z992 Dependence on renal dialysis: Secondary | ICD-10-CM | POA: Diagnosis not present

## 2023-03-14 DIAGNOSIS — D631 Anemia in chronic kidney disease: Secondary | ICD-10-CM | POA: Diagnosis not present

## 2023-03-14 DIAGNOSIS — N186 End stage renal disease: Secondary | ICD-10-CM | POA: Diagnosis not present

## 2023-03-17 DIAGNOSIS — N186 End stage renal disease: Secondary | ICD-10-CM | POA: Diagnosis not present

## 2023-03-17 DIAGNOSIS — N2581 Secondary hyperparathyroidism of renal origin: Secondary | ICD-10-CM | POA: Diagnosis not present

## 2023-03-17 DIAGNOSIS — D631 Anemia in chronic kidney disease: Secondary | ICD-10-CM | POA: Diagnosis not present

## 2023-03-17 DIAGNOSIS — Z992 Dependence on renal dialysis: Secondary | ICD-10-CM | POA: Diagnosis not present

## 2023-03-19 DIAGNOSIS — N2581 Secondary hyperparathyroidism of renal origin: Secondary | ICD-10-CM | POA: Diagnosis not present

## 2023-03-19 DIAGNOSIS — N186 End stage renal disease: Secondary | ICD-10-CM | POA: Diagnosis not present

## 2023-03-19 DIAGNOSIS — D631 Anemia in chronic kidney disease: Secondary | ICD-10-CM | POA: Diagnosis not present

## 2023-03-19 DIAGNOSIS — Z992 Dependence on renal dialysis: Secondary | ICD-10-CM | POA: Diagnosis not present

## 2023-03-21 DIAGNOSIS — Z992 Dependence on renal dialysis: Secondary | ICD-10-CM | POA: Diagnosis not present

## 2023-03-21 DIAGNOSIS — N186 End stage renal disease: Secondary | ICD-10-CM | POA: Diagnosis not present

## 2023-03-21 DIAGNOSIS — D631 Anemia in chronic kidney disease: Secondary | ICD-10-CM | POA: Diagnosis not present

## 2023-03-21 DIAGNOSIS — N2581 Secondary hyperparathyroidism of renal origin: Secondary | ICD-10-CM | POA: Diagnosis not present

## 2023-03-24 DIAGNOSIS — D631 Anemia in chronic kidney disease: Secondary | ICD-10-CM | POA: Diagnosis not present

## 2023-03-24 DIAGNOSIS — N2581 Secondary hyperparathyroidism of renal origin: Secondary | ICD-10-CM | POA: Diagnosis not present

## 2023-03-24 DIAGNOSIS — N186 End stage renal disease: Secondary | ICD-10-CM | POA: Diagnosis not present

## 2023-03-24 DIAGNOSIS — Z992 Dependence on renal dialysis: Secondary | ICD-10-CM | POA: Diagnosis not present

## 2023-03-26 DIAGNOSIS — D631 Anemia in chronic kidney disease: Secondary | ICD-10-CM | POA: Diagnosis not present

## 2023-03-26 DIAGNOSIS — Z992 Dependence on renal dialysis: Secondary | ICD-10-CM | POA: Diagnosis not present

## 2023-03-26 DIAGNOSIS — N186 End stage renal disease: Secondary | ICD-10-CM | POA: Diagnosis not present

## 2023-03-26 DIAGNOSIS — N2581 Secondary hyperparathyroidism of renal origin: Secondary | ICD-10-CM | POA: Diagnosis not present

## 2023-03-28 DIAGNOSIS — D631 Anemia in chronic kidney disease: Secondary | ICD-10-CM | POA: Diagnosis not present

## 2023-03-28 DIAGNOSIS — N186 End stage renal disease: Secondary | ICD-10-CM | POA: Diagnosis not present

## 2023-03-28 DIAGNOSIS — N2581 Secondary hyperparathyroidism of renal origin: Secondary | ICD-10-CM | POA: Diagnosis not present

## 2023-03-28 DIAGNOSIS — Z992 Dependence on renal dialysis: Secondary | ICD-10-CM | POA: Diagnosis not present

## 2023-03-30 DIAGNOSIS — N186 End stage renal disease: Secondary | ICD-10-CM | POA: Diagnosis not present

## 2023-03-30 DIAGNOSIS — D631 Anemia in chronic kidney disease: Secondary | ICD-10-CM | POA: Diagnosis not present

## 2023-03-30 DIAGNOSIS — N2581 Secondary hyperparathyroidism of renal origin: Secondary | ICD-10-CM | POA: Diagnosis not present

## 2023-03-30 DIAGNOSIS — Z992 Dependence on renal dialysis: Secondary | ICD-10-CM | POA: Diagnosis not present

## 2023-04-01 DIAGNOSIS — D631 Anemia in chronic kidney disease: Secondary | ICD-10-CM | POA: Diagnosis not present

## 2023-04-01 DIAGNOSIS — N186 End stage renal disease: Secondary | ICD-10-CM | POA: Diagnosis not present

## 2023-04-01 DIAGNOSIS — N2581 Secondary hyperparathyroidism of renal origin: Secondary | ICD-10-CM | POA: Diagnosis not present

## 2023-04-01 DIAGNOSIS — Z992 Dependence on renal dialysis: Secondary | ICD-10-CM | POA: Diagnosis not present

## 2023-04-04 DIAGNOSIS — Z992 Dependence on renal dialysis: Secondary | ICD-10-CM | POA: Diagnosis not present

## 2023-04-04 DIAGNOSIS — D631 Anemia in chronic kidney disease: Secondary | ICD-10-CM | POA: Diagnosis not present

## 2023-04-04 DIAGNOSIS — N186 End stage renal disease: Secondary | ICD-10-CM | POA: Diagnosis not present

## 2023-04-04 DIAGNOSIS — N2581 Secondary hyperparathyroidism of renal origin: Secondary | ICD-10-CM | POA: Diagnosis not present

## 2023-04-05 DIAGNOSIS — Z992 Dependence on renal dialysis: Secondary | ICD-10-CM | POA: Diagnosis not present

## 2023-04-05 DIAGNOSIS — N186 End stage renal disease: Secondary | ICD-10-CM | POA: Diagnosis not present

## 2023-04-05 DIAGNOSIS — I129 Hypertensive chronic kidney disease with stage 1 through stage 4 chronic kidney disease, or unspecified chronic kidney disease: Secondary | ICD-10-CM | POA: Diagnosis not present

## 2023-04-07 DIAGNOSIS — N2581 Secondary hyperparathyroidism of renal origin: Secondary | ICD-10-CM | POA: Diagnosis not present

## 2023-04-07 DIAGNOSIS — D631 Anemia in chronic kidney disease: Secondary | ICD-10-CM | POA: Diagnosis not present

## 2023-04-07 DIAGNOSIS — N186 End stage renal disease: Secondary | ICD-10-CM | POA: Diagnosis not present

## 2023-04-07 DIAGNOSIS — Z992 Dependence on renal dialysis: Secondary | ICD-10-CM | POA: Diagnosis not present

## 2023-04-09 DIAGNOSIS — D631 Anemia in chronic kidney disease: Secondary | ICD-10-CM | POA: Diagnosis not present

## 2023-04-09 DIAGNOSIS — N186 End stage renal disease: Secondary | ICD-10-CM | POA: Diagnosis not present

## 2023-04-09 DIAGNOSIS — N2581 Secondary hyperparathyroidism of renal origin: Secondary | ICD-10-CM | POA: Diagnosis not present

## 2023-04-09 DIAGNOSIS — Z992 Dependence on renal dialysis: Secondary | ICD-10-CM | POA: Diagnosis not present

## 2023-04-11 DIAGNOSIS — D631 Anemia in chronic kidney disease: Secondary | ICD-10-CM | POA: Diagnosis not present

## 2023-04-11 DIAGNOSIS — N186 End stage renal disease: Secondary | ICD-10-CM | POA: Diagnosis not present

## 2023-04-11 DIAGNOSIS — Z992 Dependence on renal dialysis: Secondary | ICD-10-CM | POA: Diagnosis not present

## 2023-04-11 DIAGNOSIS — N2581 Secondary hyperparathyroidism of renal origin: Secondary | ICD-10-CM | POA: Diagnosis not present

## 2023-04-13 DIAGNOSIS — L738 Other specified follicular disorders: Secondary | ICD-10-CM | POA: Diagnosis not present

## 2023-04-13 DIAGNOSIS — D1801 Hemangioma of skin and subcutaneous tissue: Secondary | ICD-10-CM | POA: Diagnosis not present

## 2023-04-13 DIAGNOSIS — L570 Actinic keratosis: Secondary | ICD-10-CM | POA: Diagnosis not present

## 2023-04-13 DIAGNOSIS — D2272 Melanocytic nevi of left lower limb, including hip: Secondary | ICD-10-CM | POA: Diagnosis not present

## 2023-04-13 DIAGNOSIS — D2261 Melanocytic nevi of right upper limb, including shoulder: Secondary | ICD-10-CM | POA: Diagnosis not present

## 2023-04-13 DIAGNOSIS — L821 Other seborrheic keratosis: Secondary | ICD-10-CM | POA: Diagnosis not present

## 2023-04-13 DIAGNOSIS — D2271 Melanocytic nevi of right lower limb, including hip: Secondary | ICD-10-CM | POA: Diagnosis not present

## 2023-04-14 DIAGNOSIS — D631 Anemia in chronic kidney disease: Secondary | ICD-10-CM | POA: Diagnosis not present

## 2023-04-14 DIAGNOSIS — N2581 Secondary hyperparathyroidism of renal origin: Secondary | ICD-10-CM | POA: Diagnosis not present

## 2023-04-14 DIAGNOSIS — N186 End stage renal disease: Secondary | ICD-10-CM | POA: Diagnosis not present

## 2023-04-14 DIAGNOSIS — Z992 Dependence on renal dialysis: Secondary | ICD-10-CM | POA: Diagnosis not present

## 2023-04-16 DIAGNOSIS — Z992 Dependence on renal dialysis: Secondary | ICD-10-CM | POA: Diagnosis not present

## 2023-04-16 DIAGNOSIS — D631 Anemia in chronic kidney disease: Secondary | ICD-10-CM | POA: Diagnosis not present

## 2023-04-16 DIAGNOSIS — N2581 Secondary hyperparathyroidism of renal origin: Secondary | ICD-10-CM | POA: Diagnosis not present

## 2023-04-16 DIAGNOSIS — N186 End stage renal disease: Secondary | ICD-10-CM | POA: Diagnosis not present

## 2023-04-18 DIAGNOSIS — D631 Anemia in chronic kidney disease: Secondary | ICD-10-CM | POA: Diagnosis not present

## 2023-04-18 DIAGNOSIS — N2581 Secondary hyperparathyroidism of renal origin: Secondary | ICD-10-CM | POA: Diagnosis not present

## 2023-04-18 DIAGNOSIS — Z992 Dependence on renal dialysis: Secondary | ICD-10-CM | POA: Diagnosis not present

## 2023-04-18 DIAGNOSIS — N186 End stage renal disease: Secondary | ICD-10-CM | POA: Diagnosis not present

## 2023-04-21 DIAGNOSIS — Z992 Dependence on renal dialysis: Secondary | ICD-10-CM | POA: Diagnosis not present

## 2023-04-21 DIAGNOSIS — N2581 Secondary hyperparathyroidism of renal origin: Secondary | ICD-10-CM | POA: Diagnosis not present

## 2023-04-21 DIAGNOSIS — D631 Anemia in chronic kidney disease: Secondary | ICD-10-CM | POA: Diagnosis not present

## 2023-04-21 DIAGNOSIS — N186 End stage renal disease: Secondary | ICD-10-CM | POA: Diagnosis not present

## 2023-04-23 DIAGNOSIS — N2581 Secondary hyperparathyroidism of renal origin: Secondary | ICD-10-CM | POA: Diagnosis not present

## 2023-04-23 DIAGNOSIS — D631 Anemia in chronic kidney disease: Secondary | ICD-10-CM | POA: Diagnosis not present

## 2023-04-23 DIAGNOSIS — N186 End stage renal disease: Secondary | ICD-10-CM | POA: Diagnosis not present

## 2023-04-23 DIAGNOSIS — Z992 Dependence on renal dialysis: Secondary | ICD-10-CM | POA: Diagnosis not present

## 2023-04-25 DIAGNOSIS — Z992 Dependence on renal dialysis: Secondary | ICD-10-CM | POA: Diagnosis not present

## 2023-04-25 DIAGNOSIS — N186 End stage renal disease: Secondary | ICD-10-CM | POA: Diagnosis not present

## 2023-04-25 DIAGNOSIS — D631 Anemia in chronic kidney disease: Secondary | ICD-10-CM | POA: Diagnosis not present

## 2023-04-25 DIAGNOSIS — N2581 Secondary hyperparathyroidism of renal origin: Secondary | ICD-10-CM | POA: Diagnosis not present

## 2023-04-27 DIAGNOSIS — N186 End stage renal disease: Secondary | ICD-10-CM | POA: Diagnosis not present

## 2023-04-27 DIAGNOSIS — N2581 Secondary hyperparathyroidism of renal origin: Secondary | ICD-10-CM | POA: Diagnosis not present

## 2023-04-27 DIAGNOSIS — D631 Anemia in chronic kidney disease: Secondary | ICD-10-CM | POA: Diagnosis not present

## 2023-04-27 DIAGNOSIS — Z992 Dependence on renal dialysis: Secondary | ICD-10-CM | POA: Diagnosis not present

## 2023-04-30 DIAGNOSIS — N186 End stage renal disease: Secondary | ICD-10-CM | POA: Diagnosis not present

## 2023-04-30 DIAGNOSIS — N2581 Secondary hyperparathyroidism of renal origin: Secondary | ICD-10-CM | POA: Diagnosis not present

## 2023-04-30 DIAGNOSIS — Z992 Dependence on renal dialysis: Secondary | ICD-10-CM | POA: Diagnosis not present

## 2023-04-30 DIAGNOSIS — D631 Anemia in chronic kidney disease: Secondary | ICD-10-CM | POA: Diagnosis not present

## 2023-05-02 DIAGNOSIS — N186 End stage renal disease: Secondary | ICD-10-CM | POA: Diagnosis not present

## 2023-05-02 DIAGNOSIS — Z992 Dependence on renal dialysis: Secondary | ICD-10-CM | POA: Diagnosis not present

## 2023-05-02 DIAGNOSIS — D631 Anemia in chronic kidney disease: Secondary | ICD-10-CM | POA: Diagnosis not present

## 2023-05-02 DIAGNOSIS — N2581 Secondary hyperparathyroidism of renal origin: Secondary | ICD-10-CM | POA: Diagnosis not present

## 2023-05-04 DIAGNOSIS — N186 End stage renal disease: Secondary | ICD-10-CM | POA: Diagnosis not present

## 2023-05-04 DIAGNOSIS — D631 Anemia in chronic kidney disease: Secondary | ICD-10-CM | POA: Diagnosis not present

## 2023-05-04 DIAGNOSIS — Z992 Dependence on renal dialysis: Secondary | ICD-10-CM | POA: Diagnosis not present

## 2023-05-04 DIAGNOSIS — N2581 Secondary hyperparathyroidism of renal origin: Secondary | ICD-10-CM | POA: Diagnosis not present

## 2023-05-06 DIAGNOSIS — I129 Hypertensive chronic kidney disease with stage 1 through stage 4 chronic kidney disease, or unspecified chronic kidney disease: Secondary | ICD-10-CM | POA: Diagnosis not present

## 2023-05-06 DIAGNOSIS — Z992 Dependence on renal dialysis: Secondary | ICD-10-CM | POA: Diagnosis not present

## 2023-05-06 DIAGNOSIS — N186 End stage renal disease: Secondary | ICD-10-CM | POA: Diagnosis not present

## 2023-05-07 DIAGNOSIS — D509 Iron deficiency anemia, unspecified: Secondary | ICD-10-CM | POA: Diagnosis not present

## 2023-05-07 DIAGNOSIS — N2581 Secondary hyperparathyroidism of renal origin: Secondary | ICD-10-CM | POA: Diagnosis not present

## 2023-05-07 DIAGNOSIS — N189 Chronic kidney disease, unspecified: Secondary | ICD-10-CM | POA: Diagnosis not present

## 2023-05-07 DIAGNOSIS — N186 End stage renal disease: Secondary | ICD-10-CM | POA: Diagnosis not present

## 2023-05-07 DIAGNOSIS — D631 Anemia in chronic kidney disease: Secondary | ICD-10-CM | POA: Diagnosis not present

## 2023-05-07 DIAGNOSIS — Z992 Dependence on renal dialysis: Secondary | ICD-10-CM | POA: Diagnosis not present

## 2023-05-09 DIAGNOSIS — Z992 Dependence on renal dialysis: Secondary | ICD-10-CM | POA: Diagnosis not present

## 2023-05-09 DIAGNOSIS — N186 End stage renal disease: Secondary | ICD-10-CM | POA: Diagnosis not present

## 2023-05-09 DIAGNOSIS — D631 Anemia in chronic kidney disease: Secondary | ICD-10-CM | POA: Diagnosis not present

## 2023-05-09 DIAGNOSIS — N2581 Secondary hyperparathyroidism of renal origin: Secondary | ICD-10-CM | POA: Diagnosis not present

## 2023-05-09 DIAGNOSIS — D509 Iron deficiency anemia, unspecified: Secondary | ICD-10-CM | POA: Diagnosis not present

## 2023-05-09 DIAGNOSIS — N189 Chronic kidney disease, unspecified: Secondary | ICD-10-CM | POA: Diagnosis not present

## 2023-05-12 DIAGNOSIS — D631 Anemia in chronic kidney disease: Secondary | ICD-10-CM | POA: Diagnosis not present

## 2023-05-12 DIAGNOSIS — N2581 Secondary hyperparathyroidism of renal origin: Secondary | ICD-10-CM | POA: Diagnosis not present

## 2023-05-12 DIAGNOSIS — N189 Chronic kidney disease, unspecified: Secondary | ICD-10-CM | POA: Diagnosis not present

## 2023-05-12 DIAGNOSIS — N186 End stage renal disease: Secondary | ICD-10-CM | POA: Diagnosis not present

## 2023-05-12 DIAGNOSIS — D509 Iron deficiency anemia, unspecified: Secondary | ICD-10-CM | POA: Diagnosis not present

## 2023-05-12 DIAGNOSIS — Z992 Dependence on renal dialysis: Secondary | ICD-10-CM | POA: Diagnosis not present

## 2023-05-14 DIAGNOSIS — N189 Chronic kidney disease, unspecified: Secondary | ICD-10-CM | POA: Diagnosis not present

## 2023-05-14 DIAGNOSIS — D631 Anemia in chronic kidney disease: Secondary | ICD-10-CM | POA: Diagnosis not present

## 2023-05-14 DIAGNOSIS — N186 End stage renal disease: Secondary | ICD-10-CM | POA: Diagnosis not present

## 2023-05-14 DIAGNOSIS — Z992 Dependence on renal dialysis: Secondary | ICD-10-CM | POA: Diagnosis not present

## 2023-05-14 DIAGNOSIS — N2581 Secondary hyperparathyroidism of renal origin: Secondary | ICD-10-CM | POA: Diagnosis not present

## 2023-05-14 DIAGNOSIS — D509 Iron deficiency anemia, unspecified: Secondary | ICD-10-CM | POA: Diagnosis not present

## 2023-05-16 DIAGNOSIS — Z992 Dependence on renal dialysis: Secondary | ICD-10-CM | POA: Diagnosis not present

## 2023-05-16 DIAGNOSIS — N189 Chronic kidney disease, unspecified: Secondary | ICD-10-CM | POA: Diagnosis not present

## 2023-05-16 DIAGNOSIS — N186 End stage renal disease: Secondary | ICD-10-CM | POA: Diagnosis not present

## 2023-05-16 DIAGNOSIS — D631 Anemia in chronic kidney disease: Secondary | ICD-10-CM | POA: Diagnosis not present

## 2023-05-16 DIAGNOSIS — N2581 Secondary hyperparathyroidism of renal origin: Secondary | ICD-10-CM | POA: Diagnosis not present

## 2023-05-16 DIAGNOSIS — D509 Iron deficiency anemia, unspecified: Secondary | ICD-10-CM | POA: Diagnosis not present

## 2023-05-19 DIAGNOSIS — Z992 Dependence on renal dialysis: Secondary | ICD-10-CM | POA: Diagnosis not present

## 2023-05-19 DIAGNOSIS — N186 End stage renal disease: Secondary | ICD-10-CM | POA: Diagnosis not present

## 2023-05-19 DIAGNOSIS — N2581 Secondary hyperparathyroidism of renal origin: Secondary | ICD-10-CM | POA: Diagnosis not present

## 2023-05-19 DIAGNOSIS — D631 Anemia in chronic kidney disease: Secondary | ICD-10-CM | POA: Diagnosis not present

## 2023-05-19 DIAGNOSIS — D509 Iron deficiency anemia, unspecified: Secondary | ICD-10-CM | POA: Diagnosis not present

## 2023-05-19 DIAGNOSIS — N189 Chronic kidney disease, unspecified: Secondary | ICD-10-CM | POA: Diagnosis not present

## 2023-05-21 DIAGNOSIS — Z992 Dependence on renal dialysis: Secondary | ICD-10-CM | POA: Diagnosis not present

## 2023-05-21 DIAGNOSIS — D509 Iron deficiency anemia, unspecified: Secondary | ICD-10-CM | POA: Diagnosis not present

## 2023-05-21 DIAGNOSIS — N189 Chronic kidney disease, unspecified: Secondary | ICD-10-CM | POA: Diagnosis not present

## 2023-05-21 DIAGNOSIS — N186 End stage renal disease: Secondary | ICD-10-CM | POA: Diagnosis not present

## 2023-05-21 DIAGNOSIS — N2581 Secondary hyperparathyroidism of renal origin: Secondary | ICD-10-CM | POA: Diagnosis not present

## 2023-05-21 DIAGNOSIS — D631 Anemia in chronic kidney disease: Secondary | ICD-10-CM | POA: Diagnosis not present

## 2023-05-23 DIAGNOSIS — Z992 Dependence on renal dialysis: Secondary | ICD-10-CM | POA: Diagnosis not present

## 2023-05-23 DIAGNOSIS — D509 Iron deficiency anemia, unspecified: Secondary | ICD-10-CM | POA: Diagnosis not present

## 2023-05-23 DIAGNOSIS — D631 Anemia in chronic kidney disease: Secondary | ICD-10-CM | POA: Diagnosis not present

## 2023-05-23 DIAGNOSIS — N186 End stage renal disease: Secondary | ICD-10-CM | POA: Diagnosis not present

## 2023-05-23 DIAGNOSIS — N189 Chronic kidney disease, unspecified: Secondary | ICD-10-CM | POA: Diagnosis not present

## 2023-05-23 DIAGNOSIS — N2581 Secondary hyperparathyroidism of renal origin: Secondary | ICD-10-CM | POA: Diagnosis not present

## 2023-05-26 DIAGNOSIS — Z992 Dependence on renal dialysis: Secondary | ICD-10-CM | POA: Diagnosis not present

## 2023-05-26 DIAGNOSIS — D631 Anemia in chronic kidney disease: Secondary | ICD-10-CM | POA: Diagnosis not present

## 2023-05-26 DIAGNOSIS — N189 Chronic kidney disease, unspecified: Secondary | ICD-10-CM | POA: Diagnosis not present

## 2023-05-26 DIAGNOSIS — N186 End stage renal disease: Secondary | ICD-10-CM | POA: Diagnosis not present

## 2023-05-26 DIAGNOSIS — D509 Iron deficiency anemia, unspecified: Secondary | ICD-10-CM | POA: Diagnosis not present

## 2023-05-26 DIAGNOSIS — N2581 Secondary hyperparathyroidism of renal origin: Secondary | ICD-10-CM | POA: Diagnosis not present

## 2023-05-28 DIAGNOSIS — D631 Anemia in chronic kidney disease: Secondary | ICD-10-CM | POA: Diagnosis not present

## 2023-05-28 DIAGNOSIS — Z992 Dependence on renal dialysis: Secondary | ICD-10-CM | POA: Diagnosis not present

## 2023-05-28 DIAGNOSIS — N186 End stage renal disease: Secondary | ICD-10-CM | POA: Diagnosis not present

## 2023-05-28 DIAGNOSIS — D509 Iron deficiency anemia, unspecified: Secondary | ICD-10-CM | POA: Diagnosis not present

## 2023-05-28 DIAGNOSIS — N2581 Secondary hyperparathyroidism of renal origin: Secondary | ICD-10-CM | POA: Diagnosis not present

## 2023-05-28 DIAGNOSIS — N189 Chronic kidney disease, unspecified: Secondary | ICD-10-CM | POA: Diagnosis not present

## 2023-05-30 DIAGNOSIS — D509 Iron deficiency anemia, unspecified: Secondary | ICD-10-CM | POA: Diagnosis not present

## 2023-05-30 DIAGNOSIS — N2581 Secondary hyperparathyroidism of renal origin: Secondary | ICD-10-CM | POA: Diagnosis not present

## 2023-05-30 DIAGNOSIS — Z992 Dependence on renal dialysis: Secondary | ICD-10-CM | POA: Diagnosis not present

## 2023-05-30 DIAGNOSIS — N186 End stage renal disease: Secondary | ICD-10-CM | POA: Diagnosis not present

## 2023-05-30 DIAGNOSIS — D631 Anemia in chronic kidney disease: Secondary | ICD-10-CM | POA: Diagnosis not present

## 2023-05-30 DIAGNOSIS — N189 Chronic kidney disease, unspecified: Secondary | ICD-10-CM | POA: Diagnosis not present

## 2023-06-02 DIAGNOSIS — D631 Anemia in chronic kidney disease: Secondary | ICD-10-CM | POA: Diagnosis not present

## 2023-06-02 DIAGNOSIS — Z992 Dependence on renal dialysis: Secondary | ICD-10-CM | POA: Diagnosis not present

## 2023-06-02 DIAGNOSIS — N2581 Secondary hyperparathyroidism of renal origin: Secondary | ICD-10-CM | POA: Diagnosis not present

## 2023-06-02 DIAGNOSIS — N186 End stage renal disease: Secondary | ICD-10-CM | POA: Diagnosis not present

## 2023-06-02 DIAGNOSIS — N189 Chronic kidney disease, unspecified: Secondary | ICD-10-CM | POA: Diagnosis not present

## 2023-06-02 DIAGNOSIS — D509 Iron deficiency anemia, unspecified: Secondary | ICD-10-CM | POA: Diagnosis not present

## 2023-06-04 DIAGNOSIS — N2581 Secondary hyperparathyroidism of renal origin: Secondary | ICD-10-CM | POA: Diagnosis not present

## 2023-06-04 DIAGNOSIS — N189 Chronic kidney disease, unspecified: Secondary | ICD-10-CM | POA: Diagnosis not present

## 2023-06-04 DIAGNOSIS — D631 Anemia in chronic kidney disease: Secondary | ICD-10-CM | POA: Diagnosis not present

## 2023-06-04 DIAGNOSIS — N186 End stage renal disease: Secondary | ICD-10-CM | POA: Diagnosis not present

## 2023-06-04 DIAGNOSIS — D509 Iron deficiency anemia, unspecified: Secondary | ICD-10-CM | POA: Diagnosis not present

## 2023-06-04 DIAGNOSIS — Z992 Dependence on renal dialysis: Secondary | ICD-10-CM | POA: Diagnosis not present

## 2023-06-06 DIAGNOSIS — N186 End stage renal disease: Secondary | ICD-10-CM | POA: Diagnosis not present

## 2023-06-06 DIAGNOSIS — N2581 Secondary hyperparathyroidism of renal origin: Secondary | ICD-10-CM | POA: Diagnosis not present

## 2023-06-06 DIAGNOSIS — I129 Hypertensive chronic kidney disease with stage 1 through stage 4 chronic kidney disease, or unspecified chronic kidney disease: Secondary | ICD-10-CM | POA: Diagnosis not present

## 2023-06-06 DIAGNOSIS — Z992 Dependence on renal dialysis: Secondary | ICD-10-CM | POA: Diagnosis not present

## 2023-06-08 DIAGNOSIS — N281 Cyst of kidney, acquired: Secondary | ICD-10-CM | POA: Diagnosis not present

## 2023-06-08 DIAGNOSIS — D4102 Neoplasm of uncertain behavior of left kidney: Secondary | ICD-10-CM | POA: Diagnosis not present

## 2023-06-09 DIAGNOSIS — N2581 Secondary hyperparathyroidism of renal origin: Secondary | ICD-10-CM | POA: Diagnosis not present

## 2023-06-09 DIAGNOSIS — Z992 Dependence on renal dialysis: Secondary | ICD-10-CM | POA: Diagnosis not present

## 2023-06-09 DIAGNOSIS — N186 End stage renal disease: Secondary | ICD-10-CM | POA: Diagnosis not present

## 2023-06-11 DIAGNOSIS — Z992 Dependence on renal dialysis: Secondary | ICD-10-CM | POA: Diagnosis not present

## 2023-06-11 DIAGNOSIS — N2581 Secondary hyperparathyroidism of renal origin: Secondary | ICD-10-CM | POA: Diagnosis not present

## 2023-06-11 DIAGNOSIS — N186 End stage renal disease: Secondary | ICD-10-CM | POA: Diagnosis not present

## 2023-06-13 DIAGNOSIS — N2581 Secondary hyperparathyroidism of renal origin: Secondary | ICD-10-CM | POA: Diagnosis not present

## 2023-06-13 DIAGNOSIS — N186 End stage renal disease: Secondary | ICD-10-CM | POA: Diagnosis not present

## 2023-06-13 DIAGNOSIS — Z992 Dependence on renal dialysis: Secondary | ICD-10-CM | POA: Diagnosis not present

## 2023-06-16 DIAGNOSIS — Z992 Dependence on renal dialysis: Secondary | ICD-10-CM | POA: Diagnosis not present

## 2023-06-16 DIAGNOSIS — N2581 Secondary hyperparathyroidism of renal origin: Secondary | ICD-10-CM | POA: Diagnosis not present

## 2023-06-16 DIAGNOSIS — N186 End stage renal disease: Secondary | ICD-10-CM | POA: Diagnosis not present

## 2023-06-18 DIAGNOSIS — N186 End stage renal disease: Secondary | ICD-10-CM | POA: Diagnosis not present

## 2023-06-18 DIAGNOSIS — N2581 Secondary hyperparathyroidism of renal origin: Secondary | ICD-10-CM | POA: Diagnosis not present

## 2023-06-18 DIAGNOSIS — Z992 Dependence on renal dialysis: Secondary | ICD-10-CM | POA: Diagnosis not present

## 2023-06-20 DIAGNOSIS — Z992 Dependence on renal dialysis: Secondary | ICD-10-CM | POA: Diagnosis not present

## 2023-06-20 DIAGNOSIS — N186 End stage renal disease: Secondary | ICD-10-CM | POA: Diagnosis not present

## 2023-06-20 DIAGNOSIS — N2581 Secondary hyperparathyroidism of renal origin: Secondary | ICD-10-CM | POA: Diagnosis not present

## 2023-06-22 ENCOUNTER — Other Ambulatory Visit: Payer: Self-pay

## 2023-06-22 ENCOUNTER — Encounter (HOSPITAL_COMMUNITY)
Admission: RE | Disposition: A | Discharge status: Home / Self Care | Payer: Self-pay | Source: Home / Self Care | Attending: Nephrology

## 2023-06-22 ENCOUNTER — Ambulatory Visit (HOSPITAL_COMMUNITY)
Admission: RE | Admit: 2023-06-22 | Discharge: 2023-06-22 | Disposition: A | Discharge status: Home / Self Care | Payer: Medicare Other | Attending: Nephrology | Admitting: Nephrology

## 2023-06-22 ENCOUNTER — Encounter (HOSPITAL_COMMUNITY): Payer: Self-pay | Admitting: Nephrology

## 2023-06-22 DIAGNOSIS — T82858A Stenosis of vascular prosthetic devices, implants and grafts, initial encounter: Secondary | ICD-10-CM | POA: Insufficient documentation

## 2023-06-22 DIAGNOSIS — Z992 Dependence on renal dialysis: Secondary | ICD-10-CM | POA: Insufficient documentation

## 2023-06-22 DIAGNOSIS — I132 Hypertensive heart and chronic kidney disease with heart failure and with stage 5 chronic kidney disease, or end stage renal disease: Secondary | ICD-10-CM | POA: Diagnosis not present

## 2023-06-22 DIAGNOSIS — Y832 Surgical operation with anastomosis, bypass or graft as the cause of abnormal reaction of the patient, or of later complication, without mention of misadventure at the time of the procedure: Secondary | ICD-10-CM | POA: Diagnosis not present

## 2023-06-22 DIAGNOSIS — I5032 Chronic diastolic (congestive) heart failure: Secondary | ICD-10-CM | POA: Insufficient documentation

## 2023-06-22 DIAGNOSIS — N186 End stage renal disease: Secondary | ICD-10-CM | POA: Insufficient documentation

## 2023-06-22 DIAGNOSIS — Z79899 Other long term (current) drug therapy: Secondary | ICD-10-CM | POA: Diagnosis not present

## 2023-06-22 DIAGNOSIS — D631 Anemia in chronic kidney disease: Secondary | ICD-10-CM | POA: Insufficient documentation

## 2023-06-22 DIAGNOSIS — Z86718 Personal history of other venous thrombosis and embolism: Secondary | ICD-10-CM | POA: Diagnosis not present

## 2023-06-22 DIAGNOSIS — I871 Compression of vein: Secondary | ICD-10-CM | POA: Diagnosis not present

## 2023-06-22 DIAGNOSIS — E039 Hypothyroidism, unspecified: Secondary | ICD-10-CM | POA: Insufficient documentation

## 2023-06-22 HISTORY — PX: PERIPHERAL VASCULAR BALLOON ANGIOPLASTY: CATH118281

## 2023-06-22 HISTORY — PX: A/V FISTULAGRAM: CATH118298

## 2023-06-22 SURGERY — A/V FISTULAGRAM
Anesthesia: LOCAL

## 2023-06-22 MED ORDER — MIDAZOLAM HCL 2 MG/2ML IJ SOLN
INTRAMUSCULAR | Status: DC | PRN
  Administered 2023-06-22: 1 mg via INTRAVENOUS

## 2023-06-22 MED ORDER — IODIXANOL 320 MG/ML IV SOLN
INTRAVENOUS | Status: DC | PRN
  Administered 2023-06-22: 6 mL via INTRAVENOUS

## 2023-06-22 MED ORDER — MIDAZOLAM HCL 2 MG/2ML IJ SOLN
INTRAMUSCULAR | Status: AC
  Filled 2023-06-22: qty 2

## 2023-06-22 MED ORDER — LIDOCAINE HCL (PF) 1 % IJ SOLN
INTRAMUSCULAR | Status: DC | PRN
  Administered 2023-06-22: 2 mL via SUBCUTANEOUS

## 2023-06-22 MED ORDER — FENTANYL CITRATE (PF) 100 MCG/2ML IJ SOLN
INTRAMUSCULAR | Status: AC
  Filled 2023-06-22: qty 2

## 2023-06-22 MED ORDER — HEPARIN (PORCINE) IN NACL 1000-0.9 UT/500ML-% IV SOLN
INTRAVENOUS | Status: DC | PRN
  Administered 2023-06-22: 500 mL

## 2023-06-22 MED ORDER — LIDOCAINE HCL (PF) 1 % IJ SOLN
INTRAMUSCULAR | Status: AC
  Filled 2023-06-22: qty 30

## 2023-06-22 MED ORDER — FENTANYL CITRATE (PF) 100 MCG/2ML IJ SOLN
INTRAMUSCULAR | Status: DC | PRN
  Administered 2023-06-22: 25 ug via INTRAVENOUS

## 2023-06-22 SURGICAL SUPPLY — 12 items
BAG SNAP BAND KOVER 36X36 (MISCELLANEOUS) ×2 IMPLANT
BALLN ATHLETIS 8X40X75 (BALLOONS) ×2
BALLOON ATHLETIS 8X40X75 (BALLOONS) IMPLANT
CATH BEACON 5 .035 65 KMP TIP (CATHETERS) IMPLANT
COVER DOME SNAP 22 D (MISCELLANEOUS) ×2 IMPLANT
GUIDEWIRE ANGLED .035 180CM (WIRE) IMPLANT
KIT MICROPUNCTURE NIT STIFF (SHEATH) IMPLANT
SHEATH PINNACLE R/O II 6F 4CM (SHEATH) IMPLANT
SYR MEDALLION 10ML (SYRINGE) IMPLANT
TRAY PV CATH (CUSTOM PROCEDURE TRAY) ×2 IMPLANT
WIRE BENTSON .035X145CM (WIRE) IMPLANT
WIRE TORQFLEX AUST .018X40CM (WIRE) IMPLANT

## 2023-06-22 NOTE — Op Note (Signed)
Patient presents for concerns of decreased access flows and abnormal exam (hyper pulsatility of inflow) of her right BCF which was created in December 2018.  She has had some low arterial pressures but no cannulation difficulties or prolonged cannulation site bleeding reported.  She does not recall her last endovascular procedure.  On the physical exam, the fistula is hyperpulsatile at the inflow with high-pitched inflow bruit and poor thrill in the outflow.  Augmentation is suboptimal.  Summary:  1)      The patient had successful angioplasty (8 mm Athletis FE ~16 atm) of 2 areas of significant 80% stenosis in the inflow cephalic vein.  2)      Patent body of the right brachiocephalic fistula, patent outflow cephalic vein, cephalic arch and right sided central veins.  Patent arterial anastomosis and proximal brachial artery. 3)      This fistula remains amenable to limited percutaneous interventions.  Description of procedure: The arm was prepped and draped in the usual sterile fashion. The right brachiocephalic fistula was cannulated (78469) with a 21G needle directed in a retrograde direction in venous limb of the fistula. A guidewire was inserted and exchanged for a 6 Fr sheath. Contrast [id number]) injection via the side port of the sheath was performed. The angiogram of the fistula (84132) showed a patent body of the fistula, patent outflow cephalic vein, cephalic arch and right centrals. There were at least 2 focal areas of flow-limiting 80% inflow cephalic vein stenosis with patent arterial anastomosis and proximal brachial artery on reflux angiogram.  The angled Glidewire was advanced and manipulated until the tip of the wire was in the proximal brachial artery.  Arteriogram revealed a patent brachial artery, arterial anastomosis with 2 focal areas of  80% inflow cephalic vein stenosis. An 8 x 40 mm Athletis angioplasty balloon was then inserted over the guidewire and positioned at the sites of  cephalic vein inflow stenosis. Venous angioplasty (44010) was carried out to 16 ATM with FULL effacement of the waist on the balloon at both the sites of stenosis. The repeat angiogram showed 10% residual stenosis with no evidence of extravasation or dissection.  Hemostasis: A 3-0 ethilon purse string suture was placed at the cannulation site on removal of the sheath.  Sedation: 1mg  Versed, Fentanyl. Sedation time. 12 minutes  Contrast. 6 mL  Monitoring: Because of the patient's comorbid conditions and sedation during the procedure, continuous EKG monitoring and O2 saturation monitoring was performed throughout the procedure by the RN. There were no abnormal arrhythmias encountered.  Complications: None  Diagnoses: I87.1 Stricture of vein  N18.6 ESRD T82.858A Stricture of access  Procedure Coding:  [phone] Cannulation and angiogram of fistula, venous angioplasty (inflow cephalic vein)  [id number] Contrast  Recommendations:  1.  Continue routine access care and monitoring at dialysis.  Refer for problems. 2. Remove the suture next treatment.   Discharge: The patient was discharged home in stable condition. The patient was given education regarding the care of the dialysis access AVF and specific instructions in case of any problems.

## 2023-06-22 NOTE — H&P (Signed)
Chief Complaint: Decreased access flows HPI:  89 year old woman with past medical history significant for hypertension, HFpEF, hypothyroidism status posttreatment of Graves' disease, history of DVT and end-stage renal disease on hemodialysis.  She is referred because of concerns with decreased access flows noted on routine monitoring at hemodialysis of her right brachiocephalic fistula.  She denies any episodes of prolonged bleeding or significant machine alarming/cannulation difficulties.  She does not recall her last endovascular procedure.  She denies any constitutional complaints such as fevers or chills and does not have any chest pain or shortness of breath.  She has been n.p.o. this morning in anticipation of the procedure.  The procedure was explained to her and after weighing the risks and benefits, she provided consent.  Past Medical History:  Diagnosis Date   Anemia    due to kidney   Chronic diastolic heart failure (HCC) 09/08/2019   DVT (deep venous thrombosis) (HCC)    hx   End stage renal disease (HCC)    Gallstones 12/2018   GERD (gastroesophageal reflux disease)    Hypertension    Renal disorder    Thyroid disease    graves dx    Past Surgical History:  Procedure Laterality Date   AV FISTULA PLACEMENT Right 04/07/2017   Procedure: ARTERIOVENOUS (AV) FISTULA CREATION;  Surgeon: Sherren Kerns, MD;  Location: [facility] OR;  Service: Vascular;  Laterality: Right;   CHOLECYSTECTOMY N/A 12/23/2018   Procedure: LAPAROSCOPIC CHOLECYSTECTOMY;  Surgeon: Harriette Bouillon, MD;  Location: [facility] OR;  Service: General;  Laterality: N/A;   COLONOSCOPY     DILATION AND CURETTAGE OF UTERUS     EYE SURGERY     cactaracts   LIPOMA EXCISION     LUMBAR EPIDURAL INJECTION  2018    Family History  Problem Relation Age of Onset   Breast cancer Mother    Ulcers Father    Breast cancer Sister    Congestive Heart Failure Maternal Grandmother 70   Hypertension Maternal Grandmother    Colon  polyps Neg Hx    Liver disease Neg Hx    Esophageal cancer Neg Hx    Social History:  reports that she has never smoked. She has never used smokeless tobacco. She reports that she does not drink alcohol and does not use drugs.  Allergies:  Allergies  Allergen Reactions   Calcitriol Itching, Anxiety, Palpitations, Other (See Comments), Cough and Shortness Of Breath    Had to go to the ED because of an increase dose (mouth and throat itching)   Doxycycline Anaphylaxis   Fish Allergy Anaphylaxis, Shortness Of Breath and Itching   Fish-Derived Products Anaphylaxis, Shortness Of Breath and Itching   Iodine Anaphylaxis, Shortness Of Breath, Itching and Other (See Comments)    NO SEAFOOD!!!!   Penicillin G Anaphylaxis   Shellfish Allergy Anaphylaxis, Itching and Swelling   Shellfish-Derived Products Anaphylaxis and Hives   Clindamycin/Lincomycin Diarrhea   Compazine [Prochlorperazine Edisylate] Other (See Comments)    Makes her want to tear off her skin   Pantoprazole     Other Reaction(s): lips went numb   Prochlorperazine     Other reaction(s): Unknown   Sulfa Antibiotics Hives   Penicillins Rash    Medications Prior to Admission  Medication Sig Dispense Refill   acetaminophen (TYLENOL) 325 MG tablet Take 325 mg by mouth every 6 (six) hours as needed for mild pain (or headaches).     hydrALAZINE (APRESOLINE) 25 MG tablet Take one tablet daily as needed SBP greater  than 160 on non dialysis days 270 tablet 3   Iron-FA-DSS-B Cmplx-Vit C (NEPHRON FA) TABS Take 1 tablet by mouth daily before supper.     levothyroxine (SYNTHROID, LEVOTHROID) 88 MCG tablet Take 88 mcg by mouth daily before breakfast.     RENVELA 0.8 g PACK packet Take 1.6 g by mouth 3 (three) times daily with meals.     Doxercalciferol (HECTOROL IV) Inject into the vein 3 (three) times a week. During dialysis     Methoxy PEG-Epoetin Beta (MIRCERA IJ) Inject into the vein every 30 (thirty) days. At dialysis      No  results found for this or any previous visit (from the past 48 hours). No results found.  Review of Systems  All other systems reviewed and are negative.   There were no vitals taken for this visit. Physical Exam Vitals and nursing note reviewed.  Constitutional:      Appearance: Normal appearance. She is normal weight.  HENT:     Head: Normocephalic and atraumatic.     Right Ear: External ear normal.     Left Ear: External ear normal.     Nose: Nose normal.     Mouth/Throat:     Mouth: Mucous membranes are dry.     Pharynx: Oropharynx is clear.  Eyes:     Extraocular Movements: Extraocular movements intact.     Conjunctiva/sclera: Conjunctivae normal.  Cardiovascular:     Rate and Rhythm: Normal rate and regular rhythm.     Pulses: Normal pulses.     Heart sounds: Normal heart sounds.  Pulmonary:     Effort: Pulmonary effort is normal.     Breath sounds: Normal breath sounds.  Abdominal:     General: Bowel sounds are normal.     Palpations: Abdomen is soft.  Musculoskeletal:     Cervical back: Normal range of motion and neck supple.     Right lower leg: No edema.     Left lower leg: No edema.     Comments: Right upper arm AV fistula with pulsatile inflow segment and poor thrill in outflow  Skin:    General: Skin is warm.  Neurological:     Mental Status: She is alert.      Assessment/Plan 1.  Decreased access flows: Suspicion raised for inflow/intra fistula stenosis for which angiogram will be undertaken with the goal of identifying the lesion and treating/managing with angioplasty.  Procedure and its risks explained to the patient and she is willing to proceed. 2.  End-stage renal disease: Resume TTS dialysis schedule after procedure today. 3.  Hypertension: Blood pressures within acceptable range and will be monitored closely with moderate sedation during dialysis. 4.  Anemia: Secondary to chronic disease, continue to monitor and manage as an outpatient with ESA  protocol per dialysis unit.  Dagoberto Ligas, MD 06/22/2023, 12:17 PM

## 2023-06-22 NOTE — Discharge Instructions (Addendum)
Okay to discharge home anytime after 1:25 PM as long as clinically stable.  General care instructions: - Do not drive or operate heavy machinery for 24hrs - Avoid making any important decisions for the remainder of the day. - You should be able to eat, drink, and resume your normal medications. - Avoid any strenuous activity for the remainder of the day. Potential complications: - Your hand is more cold or numb than usual. - You are bleeding at the site and it will not stop with direct pressure. If it was a declot expect some oozing at the site. Avoid extreme pressure to the site. - You have a change in the bruit and /or thrill in your fistula or graft. - You have a fever, swelling, see redness or feel heat at or near the puncture site. Medication instructions: - Continue routine medications unless otherwise instructed. 4.   Please have your sutures removed at your next scheduled dialysis treatment.

## 2023-06-23 DIAGNOSIS — Z992 Dependence on renal dialysis: Secondary | ICD-10-CM | POA: Diagnosis not present

## 2023-06-23 DIAGNOSIS — N2581 Secondary hyperparathyroidism of renal origin: Secondary | ICD-10-CM | POA: Diagnosis not present

## 2023-06-23 DIAGNOSIS — N186 End stage renal disease: Secondary | ICD-10-CM | POA: Diagnosis not present

## 2023-06-25 DIAGNOSIS — N186 End stage renal disease: Secondary | ICD-10-CM | POA: Diagnosis not present

## 2023-06-25 DIAGNOSIS — N2581 Secondary hyperparathyroidism of renal origin: Secondary | ICD-10-CM | POA: Diagnosis not present

## 2023-06-25 DIAGNOSIS — Z992 Dependence on renal dialysis: Secondary | ICD-10-CM | POA: Diagnosis not present

## 2023-06-27 DIAGNOSIS — N186 End stage renal disease: Secondary | ICD-10-CM | POA: Diagnosis not present

## 2023-06-27 DIAGNOSIS — N2581 Secondary hyperparathyroidism of renal origin: Secondary | ICD-10-CM | POA: Diagnosis not present

## 2023-06-27 DIAGNOSIS — Z992 Dependence on renal dialysis: Secondary | ICD-10-CM | POA: Diagnosis not present

## 2023-06-30 DIAGNOSIS — Z992 Dependence on renal dialysis: Secondary | ICD-10-CM | POA: Diagnosis not present

## 2023-06-30 DIAGNOSIS — N2581 Secondary hyperparathyroidism of renal origin: Secondary | ICD-10-CM | POA: Diagnosis not present

## 2023-06-30 DIAGNOSIS — N186 End stage renal disease: Secondary | ICD-10-CM | POA: Diagnosis not present

## 2023-07-02 DIAGNOSIS — Z992 Dependence on renal dialysis: Secondary | ICD-10-CM | POA: Diagnosis not present

## 2023-07-02 DIAGNOSIS — N2581 Secondary hyperparathyroidism of renal origin: Secondary | ICD-10-CM | POA: Diagnosis not present

## 2023-07-02 DIAGNOSIS — N186 End stage renal disease: Secondary | ICD-10-CM | POA: Diagnosis not present

## 2023-07-04 DIAGNOSIS — N186 End stage renal disease: Secondary | ICD-10-CM | POA: Diagnosis not present

## 2023-07-04 DIAGNOSIS — D631 Anemia in chronic kidney disease: Secondary | ICD-10-CM | POA: Diagnosis not present

## 2023-07-04 DIAGNOSIS — Z992 Dependence on renal dialysis: Secondary | ICD-10-CM | POA: Diagnosis not present

## 2023-07-04 DIAGNOSIS — I129 Hypertensive chronic kidney disease with stage 1 through stage 4 chronic kidney disease, or unspecified chronic kidney disease: Secondary | ICD-10-CM | POA: Diagnosis not present

## 2023-07-04 DIAGNOSIS — N2581 Secondary hyperparathyroidism of renal origin: Secondary | ICD-10-CM | POA: Diagnosis not present

## 2023-07-07 DIAGNOSIS — Z992 Dependence on renal dialysis: Secondary | ICD-10-CM | POA: Diagnosis not present

## 2023-07-07 DIAGNOSIS — D631 Anemia in chronic kidney disease: Secondary | ICD-10-CM | POA: Diagnosis not present

## 2023-07-07 DIAGNOSIS — N2581 Secondary hyperparathyroidism of renal origin: Secondary | ICD-10-CM | POA: Diagnosis not present

## 2023-07-07 DIAGNOSIS — N186 End stage renal disease: Secondary | ICD-10-CM | POA: Diagnosis not present

## 2023-07-09 DIAGNOSIS — N2581 Secondary hyperparathyroidism of renal origin: Secondary | ICD-10-CM | POA: Diagnosis not present

## 2023-07-09 DIAGNOSIS — Z992 Dependence on renal dialysis: Secondary | ICD-10-CM | POA: Diagnosis not present

## 2023-07-09 DIAGNOSIS — D631 Anemia in chronic kidney disease: Secondary | ICD-10-CM | POA: Diagnosis not present

## 2023-07-09 DIAGNOSIS — N186 End stage renal disease: Secondary | ICD-10-CM | POA: Diagnosis not present

## 2023-07-11 DIAGNOSIS — N186 End stage renal disease: Secondary | ICD-10-CM | POA: Diagnosis not present

## 2023-07-11 DIAGNOSIS — D631 Anemia in chronic kidney disease: Secondary | ICD-10-CM | POA: Diagnosis not present

## 2023-07-11 DIAGNOSIS — Z992 Dependence on renal dialysis: Secondary | ICD-10-CM | POA: Diagnosis not present

## 2023-07-11 DIAGNOSIS — N2581 Secondary hyperparathyroidism of renal origin: Secondary | ICD-10-CM | POA: Diagnosis not present

## 2023-07-14 DIAGNOSIS — N2581 Secondary hyperparathyroidism of renal origin: Secondary | ICD-10-CM | POA: Diagnosis not present

## 2023-07-14 DIAGNOSIS — N186 End stage renal disease: Secondary | ICD-10-CM | POA: Diagnosis not present

## 2023-07-14 DIAGNOSIS — D631 Anemia in chronic kidney disease: Secondary | ICD-10-CM | POA: Diagnosis not present

## 2023-07-14 DIAGNOSIS — Z992 Dependence on renal dialysis: Secondary | ICD-10-CM | POA: Diagnosis not present

## 2023-07-16 DIAGNOSIS — D631 Anemia in chronic kidney disease: Secondary | ICD-10-CM | POA: Diagnosis not present

## 2023-07-16 DIAGNOSIS — N186 End stage renal disease: Secondary | ICD-10-CM | POA: Diagnosis not present

## 2023-07-16 DIAGNOSIS — Z992 Dependence on renal dialysis: Secondary | ICD-10-CM | POA: Diagnosis not present

## 2023-07-16 DIAGNOSIS — N2581 Secondary hyperparathyroidism of renal origin: Secondary | ICD-10-CM | POA: Diagnosis not present

## 2023-07-18 DIAGNOSIS — N2581 Secondary hyperparathyroidism of renal origin: Secondary | ICD-10-CM | POA: Diagnosis not present

## 2023-07-18 DIAGNOSIS — D631 Anemia in chronic kidney disease: Secondary | ICD-10-CM | POA: Diagnosis not present

## 2023-07-18 DIAGNOSIS — Z992 Dependence on renal dialysis: Secondary | ICD-10-CM | POA: Diagnosis not present

## 2023-07-18 DIAGNOSIS — N186 End stage renal disease: Secondary | ICD-10-CM | POA: Diagnosis not present

## 2023-07-21 DIAGNOSIS — N2581 Secondary hyperparathyroidism of renal origin: Secondary | ICD-10-CM | POA: Diagnosis not present

## 2023-07-21 DIAGNOSIS — Z992 Dependence on renal dialysis: Secondary | ICD-10-CM | POA: Diagnosis not present

## 2023-07-21 DIAGNOSIS — D631 Anemia in chronic kidney disease: Secondary | ICD-10-CM | POA: Diagnosis not present

## 2023-07-21 DIAGNOSIS — N186 End stage renal disease: Secondary | ICD-10-CM | POA: Diagnosis not present

## 2023-07-23 DIAGNOSIS — Z992 Dependence on renal dialysis: Secondary | ICD-10-CM | POA: Diagnosis not present

## 2023-07-23 DIAGNOSIS — D631 Anemia in chronic kidney disease: Secondary | ICD-10-CM | POA: Diagnosis not present

## 2023-07-23 DIAGNOSIS — N186 End stage renal disease: Secondary | ICD-10-CM | POA: Diagnosis not present

## 2023-07-23 DIAGNOSIS — N2581 Secondary hyperparathyroidism of renal origin: Secondary | ICD-10-CM | POA: Diagnosis not present

## 2023-07-25 DIAGNOSIS — N186 End stage renal disease: Secondary | ICD-10-CM | POA: Diagnosis not present

## 2023-07-25 DIAGNOSIS — D631 Anemia in chronic kidney disease: Secondary | ICD-10-CM | POA: Diagnosis not present

## 2023-07-25 DIAGNOSIS — N2581 Secondary hyperparathyroidism of renal origin: Secondary | ICD-10-CM | POA: Diagnosis not present

## 2023-07-25 DIAGNOSIS — Z992 Dependence on renal dialysis: Secondary | ICD-10-CM | POA: Diagnosis not present

## 2023-07-28 DIAGNOSIS — N2581 Secondary hyperparathyroidism of renal origin: Secondary | ICD-10-CM | POA: Diagnosis not present

## 2023-07-28 DIAGNOSIS — N186 End stage renal disease: Secondary | ICD-10-CM | POA: Diagnosis not present

## 2023-07-28 DIAGNOSIS — Z992 Dependence on renal dialysis: Secondary | ICD-10-CM | POA: Diagnosis not present

## 2023-07-28 DIAGNOSIS — D631 Anemia in chronic kidney disease: Secondary | ICD-10-CM | POA: Diagnosis not present

## 2023-07-30 DIAGNOSIS — N2581 Secondary hyperparathyroidism of renal origin: Secondary | ICD-10-CM | POA: Diagnosis not present

## 2023-07-30 DIAGNOSIS — Z992 Dependence on renal dialysis: Secondary | ICD-10-CM | POA: Diagnosis not present

## 2023-07-30 DIAGNOSIS — N186 End stage renal disease: Secondary | ICD-10-CM | POA: Diagnosis not present

## 2023-07-30 DIAGNOSIS — D631 Anemia in chronic kidney disease: Secondary | ICD-10-CM | POA: Diagnosis not present

## 2023-08-01 DIAGNOSIS — Z992 Dependence on renal dialysis: Secondary | ICD-10-CM | POA: Diagnosis not present

## 2023-08-01 DIAGNOSIS — D631 Anemia in chronic kidney disease: Secondary | ICD-10-CM | POA: Diagnosis not present

## 2023-08-01 DIAGNOSIS — N2581 Secondary hyperparathyroidism of renal origin: Secondary | ICD-10-CM | POA: Diagnosis not present

## 2023-08-01 DIAGNOSIS — N186 End stage renal disease: Secondary | ICD-10-CM | POA: Diagnosis not present

## 2023-08-04 DIAGNOSIS — I129 Hypertensive chronic kidney disease with stage 1 through stage 4 chronic kidney disease, or unspecified chronic kidney disease: Secondary | ICD-10-CM | POA: Diagnosis not present

## 2023-08-04 DIAGNOSIS — Z992 Dependence on renal dialysis: Secondary | ICD-10-CM | POA: Diagnosis not present

## 2023-08-04 DIAGNOSIS — N186 End stage renal disease: Secondary | ICD-10-CM | POA: Diagnosis not present

## 2023-08-04 DIAGNOSIS — N2581 Secondary hyperparathyroidism of renal origin: Secondary | ICD-10-CM | POA: Diagnosis not present

## 2023-08-04 DIAGNOSIS — D631 Anemia in chronic kidney disease: Secondary | ICD-10-CM | POA: Diagnosis not present

## 2023-08-06 DIAGNOSIS — Z992 Dependence on renal dialysis: Secondary | ICD-10-CM | POA: Diagnosis not present

## 2023-08-06 DIAGNOSIS — D631 Anemia in chronic kidney disease: Secondary | ICD-10-CM | POA: Diagnosis not present

## 2023-08-06 DIAGNOSIS — N186 End stage renal disease: Secondary | ICD-10-CM | POA: Diagnosis not present

## 2023-08-06 DIAGNOSIS — N2581 Secondary hyperparathyroidism of renal origin: Secondary | ICD-10-CM | POA: Diagnosis not present

## 2023-08-08 DIAGNOSIS — N2581 Secondary hyperparathyroidism of renal origin: Secondary | ICD-10-CM | POA: Diagnosis not present

## 2023-08-08 DIAGNOSIS — N186 End stage renal disease: Secondary | ICD-10-CM | POA: Diagnosis not present

## 2023-08-08 DIAGNOSIS — D631 Anemia in chronic kidney disease: Secondary | ICD-10-CM | POA: Diagnosis not present

## 2023-08-08 DIAGNOSIS — Z992 Dependence on renal dialysis: Secondary | ICD-10-CM | POA: Diagnosis not present

## 2023-08-11 DIAGNOSIS — N186 End stage renal disease: Secondary | ICD-10-CM | POA: Diagnosis not present

## 2023-08-11 DIAGNOSIS — Z992 Dependence on renal dialysis: Secondary | ICD-10-CM | POA: Diagnosis not present

## 2023-08-11 DIAGNOSIS — D631 Anemia in chronic kidney disease: Secondary | ICD-10-CM | POA: Diagnosis not present

## 2023-08-11 DIAGNOSIS — N2581 Secondary hyperparathyroidism of renal origin: Secondary | ICD-10-CM | POA: Diagnosis not present

## 2023-08-13 DIAGNOSIS — Z992 Dependence on renal dialysis: Secondary | ICD-10-CM | POA: Diagnosis not present

## 2023-08-13 DIAGNOSIS — N2581 Secondary hyperparathyroidism of renal origin: Secondary | ICD-10-CM | POA: Diagnosis not present

## 2023-08-13 DIAGNOSIS — D631 Anemia in chronic kidney disease: Secondary | ICD-10-CM | POA: Diagnosis not present

## 2023-08-13 DIAGNOSIS — N186 End stage renal disease: Secondary | ICD-10-CM | POA: Diagnosis not present

## 2023-08-15 DIAGNOSIS — Z992 Dependence on renal dialysis: Secondary | ICD-10-CM | POA: Diagnosis not present

## 2023-08-15 DIAGNOSIS — N186 End stage renal disease: Secondary | ICD-10-CM | POA: Diagnosis not present

## 2023-08-15 DIAGNOSIS — D631 Anemia in chronic kidney disease: Secondary | ICD-10-CM | POA: Diagnosis not present

## 2023-08-15 DIAGNOSIS — N2581 Secondary hyperparathyroidism of renal origin: Secondary | ICD-10-CM | POA: Diagnosis not present

## 2023-08-18 DIAGNOSIS — N186 End stage renal disease: Secondary | ICD-10-CM | POA: Diagnosis not present

## 2023-08-18 DIAGNOSIS — D631 Anemia in chronic kidney disease: Secondary | ICD-10-CM | POA: Diagnosis not present

## 2023-08-18 DIAGNOSIS — Z992 Dependence on renal dialysis: Secondary | ICD-10-CM | POA: Diagnosis not present

## 2023-08-18 DIAGNOSIS — N2581 Secondary hyperparathyroidism of renal origin: Secondary | ICD-10-CM | POA: Diagnosis not present

## 2023-08-20 DIAGNOSIS — N186 End stage renal disease: Secondary | ICD-10-CM | POA: Diagnosis not present

## 2023-08-20 DIAGNOSIS — Z992 Dependence on renal dialysis: Secondary | ICD-10-CM | POA: Diagnosis not present

## 2023-08-20 DIAGNOSIS — N2581 Secondary hyperparathyroidism of renal origin: Secondary | ICD-10-CM | POA: Diagnosis not present

## 2023-08-20 DIAGNOSIS — D631 Anemia in chronic kidney disease: Secondary | ICD-10-CM | POA: Diagnosis not present

## 2023-08-22 DIAGNOSIS — N2581 Secondary hyperparathyroidism of renal origin: Secondary | ICD-10-CM | POA: Diagnosis not present

## 2023-08-22 DIAGNOSIS — D631 Anemia in chronic kidney disease: Secondary | ICD-10-CM | POA: Diagnosis not present

## 2023-08-22 DIAGNOSIS — N186 End stage renal disease: Secondary | ICD-10-CM | POA: Diagnosis not present

## 2023-08-22 DIAGNOSIS — Z992 Dependence on renal dialysis: Secondary | ICD-10-CM | POA: Diagnosis not present

## 2023-08-25 DIAGNOSIS — Z992 Dependence on renal dialysis: Secondary | ICD-10-CM | POA: Diagnosis not present

## 2023-08-25 DIAGNOSIS — N2581 Secondary hyperparathyroidism of renal origin: Secondary | ICD-10-CM | POA: Diagnosis not present

## 2023-08-25 DIAGNOSIS — N186 End stage renal disease: Secondary | ICD-10-CM | POA: Diagnosis not present

## 2023-08-25 DIAGNOSIS — D631 Anemia in chronic kidney disease: Secondary | ICD-10-CM | POA: Diagnosis not present

## 2023-08-27 DIAGNOSIS — Z992 Dependence on renal dialysis: Secondary | ICD-10-CM | POA: Diagnosis not present

## 2023-08-27 DIAGNOSIS — D631 Anemia in chronic kidney disease: Secondary | ICD-10-CM | POA: Diagnosis not present

## 2023-08-27 DIAGNOSIS — N186 End stage renal disease: Secondary | ICD-10-CM | POA: Diagnosis not present

## 2023-08-27 DIAGNOSIS — N2581 Secondary hyperparathyroidism of renal origin: Secondary | ICD-10-CM | POA: Diagnosis not present

## 2023-08-29 DIAGNOSIS — N186 End stage renal disease: Secondary | ICD-10-CM | POA: Diagnosis not present

## 2023-08-29 DIAGNOSIS — N2581 Secondary hyperparathyroidism of renal origin: Secondary | ICD-10-CM | POA: Diagnosis not present

## 2023-08-29 DIAGNOSIS — D631 Anemia in chronic kidney disease: Secondary | ICD-10-CM | POA: Diagnosis not present

## 2023-08-29 DIAGNOSIS — Z992 Dependence on renal dialysis: Secondary | ICD-10-CM | POA: Diagnosis not present

## 2023-09-01 DIAGNOSIS — N2581 Secondary hyperparathyroidism of renal origin: Secondary | ICD-10-CM | POA: Diagnosis not present

## 2023-09-01 DIAGNOSIS — D631 Anemia in chronic kidney disease: Secondary | ICD-10-CM | POA: Diagnosis not present

## 2023-09-01 DIAGNOSIS — N186 End stage renal disease: Secondary | ICD-10-CM | POA: Diagnosis not present

## 2023-09-01 DIAGNOSIS — Z992 Dependence on renal dialysis: Secondary | ICD-10-CM | POA: Diagnosis not present

## 2023-09-03 DIAGNOSIS — Z992 Dependence on renal dialysis: Secondary | ICD-10-CM | POA: Diagnosis not present

## 2023-09-03 DIAGNOSIS — N186 End stage renal disease: Secondary | ICD-10-CM | POA: Diagnosis not present

## 2023-09-03 DIAGNOSIS — I129 Hypertensive chronic kidney disease with stage 1 through stage 4 chronic kidney disease, or unspecified chronic kidney disease: Secondary | ICD-10-CM | POA: Diagnosis not present

## 2023-09-03 DIAGNOSIS — D631 Anemia in chronic kidney disease: Secondary | ICD-10-CM | POA: Diagnosis not present

## 2023-09-03 DIAGNOSIS — N2581 Secondary hyperparathyroidism of renal origin: Secondary | ICD-10-CM | POA: Diagnosis not present

## 2023-09-05 DIAGNOSIS — N186 End stage renal disease: Secondary | ICD-10-CM | POA: Diagnosis not present

## 2023-09-05 DIAGNOSIS — N2581 Secondary hyperparathyroidism of renal origin: Secondary | ICD-10-CM | POA: Diagnosis not present

## 2023-09-05 DIAGNOSIS — D631 Anemia in chronic kidney disease: Secondary | ICD-10-CM | POA: Diagnosis not present

## 2023-09-05 DIAGNOSIS — Z992 Dependence on renal dialysis: Secondary | ICD-10-CM | POA: Diagnosis not present

## 2023-09-08 DIAGNOSIS — N2581 Secondary hyperparathyroidism of renal origin: Secondary | ICD-10-CM | POA: Diagnosis not present

## 2023-09-08 DIAGNOSIS — N186 End stage renal disease: Secondary | ICD-10-CM | POA: Diagnosis not present

## 2023-09-08 DIAGNOSIS — Z992 Dependence on renal dialysis: Secondary | ICD-10-CM | POA: Diagnosis not present

## 2023-09-08 DIAGNOSIS — D631 Anemia in chronic kidney disease: Secondary | ICD-10-CM | POA: Diagnosis not present

## 2023-09-10 DIAGNOSIS — D631 Anemia in chronic kidney disease: Secondary | ICD-10-CM | POA: Diagnosis not present

## 2023-09-10 DIAGNOSIS — N186 End stage renal disease: Secondary | ICD-10-CM | POA: Diagnosis not present

## 2023-09-10 DIAGNOSIS — N2581 Secondary hyperparathyroidism of renal origin: Secondary | ICD-10-CM | POA: Diagnosis not present

## 2023-09-10 DIAGNOSIS — Z992 Dependence on renal dialysis: Secondary | ICD-10-CM | POA: Diagnosis not present

## 2023-09-12 DIAGNOSIS — N2581 Secondary hyperparathyroidism of renal origin: Secondary | ICD-10-CM | POA: Diagnosis not present

## 2023-09-12 DIAGNOSIS — N186 End stage renal disease: Secondary | ICD-10-CM | POA: Diagnosis not present

## 2023-09-12 DIAGNOSIS — D631 Anemia in chronic kidney disease: Secondary | ICD-10-CM | POA: Diagnosis not present

## 2023-09-12 DIAGNOSIS — Z992 Dependence on renal dialysis: Secondary | ICD-10-CM | POA: Diagnosis not present

## 2023-09-15 DIAGNOSIS — D631 Anemia in chronic kidney disease: Secondary | ICD-10-CM | POA: Diagnosis not present

## 2023-09-15 DIAGNOSIS — N186 End stage renal disease: Secondary | ICD-10-CM | POA: Diagnosis not present

## 2023-09-15 DIAGNOSIS — N2581 Secondary hyperparathyroidism of renal origin: Secondary | ICD-10-CM | POA: Diagnosis not present

## 2023-09-15 DIAGNOSIS — Z992 Dependence on renal dialysis: Secondary | ICD-10-CM | POA: Diagnosis not present

## 2023-09-17 DIAGNOSIS — Z992 Dependence on renal dialysis: Secondary | ICD-10-CM | POA: Diagnosis not present

## 2023-09-17 DIAGNOSIS — N186 End stage renal disease: Secondary | ICD-10-CM | POA: Diagnosis not present

## 2023-09-17 DIAGNOSIS — N2581 Secondary hyperparathyroidism of renal origin: Secondary | ICD-10-CM | POA: Diagnosis not present

## 2023-09-17 DIAGNOSIS — D631 Anemia in chronic kidney disease: Secondary | ICD-10-CM | POA: Diagnosis not present

## 2023-09-19 DIAGNOSIS — N186 End stage renal disease: Secondary | ICD-10-CM | POA: Diagnosis not present

## 2023-09-19 DIAGNOSIS — D631 Anemia in chronic kidney disease: Secondary | ICD-10-CM | POA: Diagnosis not present

## 2023-09-19 DIAGNOSIS — N2581 Secondary hyperparathyroidism of renal origin: Secondary | ICD-10-CM | POA: Diagnosis not present

## 2023-09-19 DIAGNOSIS — Z992 Dependence on renal dialysis: Secondary | ICD-10-CM | POA: Diagnosis not present

## 2023-09-21 ENCOUNTER — Encounter: Payer: Self-pay | Admitting: Nurse Practitioner

## 2023-09-21 ENCOUNTER — Ambulatory Visit: Attending: Nurse Practitioner | Admitting: Nurse Practitioner

## 2023-09-21 VITALS — BP 150/76 | HR 86 | Ht 64.0 in | Wt 113.0 lb

## 2023-09-21 DIAGNOSIS — I447 Left bundle-branch block, unspecified: Secondary | ICD-10-CM | POA: Diagnosis not present

## 2023-09-21 DIAGNOSIS — Z992 Dependence on renal dialysis: Secondary | ICD-10-CM | POA: Diagnosis not present

## 2023-09-21 DIAGNOSIS — I5032 Chronic diastolic (congestive) heart failure: Secondary | ICD-10-CM | POA: Insufficient documentation

## 2023-09-21 DIAGNOSIS — E782 Mixed hyperlipidemia: Secondary | ICD-10-CM | POA: Insufficient documentation

## 2023-09-21 DIAGNOSIS — I10 Essential (primary) hypertension: Secondary | ICD-10-CM | POA: Insufficient documentation

## 2023-09-21 DIAGNOSIS — N186 End stage renal disease: Secondary | ICD-10-CM | POA: Insufficient documentation

## 2023-09-21 NOTE — Patient Instructions (Signed)
 Medication Instructions:  Your physician recommends that you continue on your current medications as directed. Please refer to the Current Medication list given to you today.  *If you need a refill on your cardiac medications before your next appointment, please call your pharmacy*  Lab Work: NONE ordered at this time of appointment   Testing/Procedures: NONE ordered at this time of appointment   Follow-Up: At [facility], you and your health needs are our priority.  As part of our continuing mission to provide you with exceptional heart care, our providers are all part of one team.  This team includes your primary Cardiologist (physician) and Advanced Practice Providers or APPs (Physician Assistants and Nurse Practitioners) who all work together to provide you with the care you need, when you need it.  Your next appointment:   6 month(s)  Provider:   Maudine Sos, MD    We recommend signing up for the patient portal called "MyChart".  Sign up information is provided on this After Visit Summary.  MyChart is used to connect with patients for Virtual Visits (Telemedicine).  Patients are able to view lab/test results, encounter notes, upcoming appointments, etc.  Non-urgent messages can be sent to your provider as well.   To learn more about what you can do with MyChart, go to [email or web address].

## 2023-09-21 NOTE — Progress Notes (Signed)
 Office Visit    Patient Name: Yvonne Powell Date of Encounter: 09/21/2023  Primary Care Provider:  Barnetta Liberty, MD Primary Cardiologist:  Maudine Sos, MD  Chief Complaint    89 year old female with a history of chronic diastolic heart failure, hypertension, hyperlipidemia, ESRD on HD (T, R, S), DVT, anemia, hypothyroidism, and GERD who presents for follow-up related to heart failure.  Past Medical History    Past Medical History:  Diagnosis Date   Anemia    due to kidney   Chronic diastolic heart failure (HCC) 09/08/2019   DVT (deep venous thrombosis) (HCC)    hx   End stage renal disease (HCC)    Gallstones 12/2018   GERD (gastroesophageal reflux disease)    Hypertension    Renal disorder    Thyroid  disease    graves dx   Past Surgical History:  Procedure Laterality Date   A/V FISTULAGRAM N/A 06/22/2023   Procedure: A/V Fistulagram;  Surgeon: Melodie Spry, MD;  Location: [facility] INVASIVE CV LAB;  Service: Cardiovascular;  Laterality: N/A;   AV FISTULA PLACEMENT Right 04/07/2017   Procedure: ARTERIOVENOUS (AV) FISTULA CREATION;  Surgeon: Richrd Char, MD;  Location: [facility] OR;  Service: Vascular;  Laterality: Right;   CHOLECYSTECTOMY N/A 12/23/2018   Procedure: LAPAROSCOPIC CHOLECYSTECTOMY;  Surgeon: Sim Dryer, MD;  Location: [facility] OR;  Service: General;  Laterality: N/A;   COLONOSCOPY     DILATION AND CURETTAGE OF UTERUS     EYE SURGERY     cactaracts   LIPOMA EXCISION     LUMBAR EPIDURAL INJECTION  2018   PERIPHERAL VASCULAR BALLOON ANGIOPLASTY  06/22/2023   Procedure: PERIPHERAL VASCULAR BALLOON ANGIOPLASTY;  Surgeon: Melodie Spry, MD;  Location: [facility] INVASIVE CV LAB;  Service: Cardiovascular;;  Inflow Cephalic Vein x2    Allergies  Allergies  Allergen Reactions   Calcitriol  Itching, Anxiety, Palpitations, Other (See Comments), Cough and Shortness Of Breath    Had to go to the ED because of an increase dose (mouth and throat itching)   Doxycycline  Anaphylaxis   Fish Allergy Anaphylaxis, Shortness Of Breath and Itching   Fish-Derived Products Anaphylaxis, Shortness Of Breath and Itching   Iodine  Anaphylaxis, Shortness Of Breath, Itching and Other (See Comments)    NO SEAFOOD!!!!   Penicillin G Anaphylaxis   Shellfish Allergy Anaphylaxis, Itching and Swelling   Shellfish-Derived Products Anaphylaxis and Hives   Clindamycin/Lincomycin Diarrhea   Compazine [Prochlorperazine Edisylate] Other (See Comments)    Makes her want to tear off her skin   Pantoprazole     Other Reaction(s): lips went numb   Prochlorperazine     Other reaction(s): Unknown   Sulfa Antibiotics Hives   Penicillins Rash     Labs/Other Studies Reviewed    The following studies were reviewed today:  Cardiac Studies & Procedures   ______________________________________________________________________________________________     ECHOCARDIOGRAM  ECHOCARDIOGRAM COMPLETE 09/08/2019  Narrative ECHOCARDIOGRAM REPORT    Patient Name:   Yvonne Powell Date of Exam: 09/08/2019 Medical Rec #:  [id number]      Height:       66.0 in Accession #:    [id number]     Weight:       119.0 lb Date of Birth:  10/01/1928       BSA:          1.604 m Patient Age:    89 years       BP:           162/73  mmHg Patient Gender: F              HR:           71 bpm. Exam Location:  Inpatient  Procedure: 2D Echo  Indications:    CHF 428  History:        Patient has no prior history of Echocardiogram examinations. Risk Factors:Hypertension.  Sonographer:    Belton Boy RDCS (AE) Referring Phys: [id number] DAVID MANUEL ORTIZ  IMPRESSIONS   1. Left ventricular ejection fraction, by estimation, is 50 to 55%. The left ventricle has low normal function. Septal-lateral dyssynchrony consistent with LBBB. There is moderate left ventricular hypertrophy. Left ventricular diastolic parameters are consistent with Grade I diastolic dysfunction (impaired relaxation). 2. Right ventricular  systolic function is normal. The right ventricular size is normal. Tricuspid regurgitation signal is inadequate for assessing PA pressure. 3. Left atrial size was moderately dilated. 4. The mitral valve is degenerative. Mild mitral valve regurgitation. No evidence of mitral stenosis. 5. The aortic valve is tricuspid. Aortic valve regurgitation is not visualized. Mild aortic valve sclerosis is present, with no evidence of aortic valve stenosis. 6. The inferior vena cava is dilated in size with <50% respiratory variability, suggesting right atrial pressure of 15 mmHg. 7. Small circumferential pericardial effusion, no evidence for tamponade. I do not think that the dilated IVC is related to the pericardial effusion.  FINDINGS Left Ventricle: Left ventricular ejection fraction, by estimation, is 50 to 55%. The left ventricle has low normal function. The left ventricle demonstrates regional wall motion abnormalities. The left ventricular internal cavity size was normal in size. There is moderate left ventricular hypertrophy. Left ventricular diastolic parameters are consistent with Grade I diastolic dysfunction (impaired relaxation).  Right Ventricle: The right ventricular size is normal. No increase in right ventricular wall thickness. Right ventricular systolic function is normal. Tricuspid regurgitation signal is inadequate for assessing PA pressure.  Left Atrium: Left atrial size was moderately dilated.  Right Atrium: Right atrial size was normal in size.  Pericardium: A small pericardial effusion is present.  Mitral Valve: The mitral valve is degenerative in appearance. There is mild calcification of the mitral valve leaflet(s). Moderate mitral annular calcification. Mild mitral valve regurgitation. No evidence of mitral valve stenosis. MV peak gradient, 13.1 mmHg. The mean mitral valve gradient is 8.0 mmHg.  Tricuspid Valve: The tricuspid valve is normal in structure. Tricuspid valve  regurgitation is trivial.  Aortic Valve: The aortic valve is tricuspid. Aortic valve regurgitation is not visualized. Mild aortic valve sclerosis is present, with no evidence of aortic valve stenosis.  Pulmonic Valve: The pulmonic valve was normal in structure. Pulmonic valve regurgitation is not visualized.  Aorta: The aortic root is normal in size and structure.  Venous: The inferior vena cava is dilated in size with less than 50% respiratory variability, suggesting right atrial pressure of 15 mmHg.  IAS/Shunts: No atrial level shunt detected by color flow Doppler.   LEFT VENTRICLE PLAX 2D LVIDd:         5.00 cm  Diastology LVIDs:         3.30 cm  LV e' lateral:   6.85 cm/s LV PW:         1.10 cm  LV E/e' lateral: 23.4 LV IVS:        1.50 cm LVOT diam:     1.90 cm LV SV:         112 LV SV Index:   70 LVOT Area:  2.84 cm   RIGHT VENTRICLE TAPSE (M-mode): 2.5 cm  LEFT ATRIUM             Index       RIGHT ATRIUM           Index LA diam:        4.40 cm 2.74 cm/m  RA Area:     17.50 cm LA Vol (A2C):   48.3 ml 30.12 ml/m RA Volume:   48.90 ml  30.49 ml/m LA Vol (A4C):   91.1 ml 56.77 ml/m LA Biplane Vol: 53.7 ml 33.48 ml/m AORTIC VALVE LVOT Vmax:   147.00 cm/s LVOT Vmean:  110.000 cm/s LVOT VTI:    0.394 m  AORTA Ao Root diam: 3.10 cm  MITRAL VALVE MV Area (PHT): 5.13 cm     SHUNTS MV Peak grad:  13.1 mmHg    Systemic VTI:  0.39 m MV Mean grad:  8.0 mmHg     Systemic Diam: 1.90 cm MV Vmax:       1.81 m/s MV Vmean:      134.0 cm/s MV Decel Time: 148 msec MR Peak grad: 127.7 mmHg MR Mean grad: 79.0 mmHg MR Vmax:      565.00 cm/s MR Vmean:     423.0 cm/s MV E velocity: 160.00 cm/s MV A velocity: 162.00 cm/s MV E/A ratio:  0.99  Peder Bourdon MD Electronically signed by Peder Bourdon MD Signature Date/Time: 09/08/2019/5:55:06 PM    Final          ______________________________________________________________________________________________      Recent Labs: No results found for requested labs within last 365 days.  Recent Lipid Panel No results found for: "CHOL", "TRIG", "HDL", "CHOLHDL", "VLDL", "LDLCALC", "LDLDIRECT"  History of Present Illness    89 year old female with the above past medical history including chronic diastolic heart failure, hypertension, hyperlipidemia, ESRD on HD (T, R, S), DVT, anemia, hypothyroidism, and GERD.  She was hospitalized in 09/2019 in the setting of acute on chronic diastolic heart failure.  Echocardiogram at the time showed EF 50 to 55%, G1 DD, small pericardial effusion.  She was diuresed with IV Lasix .  She is now on dialysis in the setting of ESRD.  She has a history of labile blood pressure.  She was last seen in the office on 10/31/2022 and was stable from a cardiac standpoint. She underwent successful AV fistulogram with angioplasty of 2 areas of sites of again 80% stenosis in the inflow cephalic vein in 08/979.  She presents today for follow-up.  Since her last visit she has been stable from a cardiac standpoint.  She denies any chest pain, dyspnea, edema, PND, orthopnea, weight gain.  BP has been stable at home.  Overall, she reports feeling well.   Home Medications    Current Outpatient Medications  Medication Sig Dispense Refill   acetaminophen  (TYLENOL ) 325 MG tablet Take 325 mg by mouth every 6 (six) hours as needed for mild pain (or headaches).     Doxercalciferol  (HECTOROL  IV) Inject into the vein 3 (three) times a week. During dialysis     hydrALAZINE  (APRESOLINE ) 25 MG tablet Take one tablet daily as needed SBP greater than 160 on non dialysis days 270 tablet 3   Iron-FA-DSS-B Cmplx-Vit C (NEPHRON FA) TABS Take 1 tablet by mouth daily before supper.     levothyroxine  (SYNTHROID , LEVOTHROID) 88 MCG tablet Take 88 mcg by mouth daily before breakfast.     Methoxy PEG-Epoetin  Beta (MIRCERA IJ) Inject into the vein  every 30 (thirty) days. At dialysis     RENVELA 0.8 g PACK packet Take  1.6 g by mouth 3 (three) times daily with meals.     No current facility-administered medications for this visit.     Review of Systems    She denies chest pain, palpitations, dyspnea, pnd, orthopnea, n, v, dizziness, syncope, edema, weight gain, or early satiety. All other systems reviewed and are otherwise negative except as noted above.   Physical Exam    VS:  BP (!) 150/76   Pulse 86   Ht 5\' 4"  (1.626 m)   Wt 113 lb (51.3 kg)   SpO2 96%   BMI 19.40 kg/m   GEN: Well nourished, well developed, in no acute distress. HEENT: normal. Neck: Supple, no JVD, carotid bruits, or masses. Cardiac: RRR, no murmurs, rubs, or gallops. No clubbing, cyanosis, edema.  Radials/DP/PT 2+ and equal bilaterally.  Respiratory:  Respirations regular and unlabored, clear to auscultation bilaterally. GI: Soft, nontender, nondistended, BS + x 4. MS: no deformity or atrophy. Skin: warm and dry, no rash. Neuro:  Strength and sensation are intact. Psych: Normal affect.  Accessory Clinical Findings    ECG personally reviewed by me today - EKG Interpretation Date/Time:  Monday Sep 21 2023 13:40:26 EDT Ventricular Rate:  86 PR Interval:  196 QRS Duration:  128 QT Interval:  414 QTC Calculation: 495 R Axis:   93  Text Interpretation: Normal sinus rhythm Left bundle branch block No significant change since last tracing Confirmed by Marlana Silvan ([id number]) on 09/21/2023 1:53:44 PM  - no acute changes.   Lab Results  Component Value Date   WBC 4.9 02/01/2022   HGB 10.1 (L) 02/01/2022   HCT 29.4 (L) 02/01/2022   MCV 97.0 02/01/2022   PLT 178 02/01/2022   Lab Results  Component Value Date   CREATININE 6.10 (H) 02/01/2022   BUN 36 (H) 02/01/2022   NA 135 02/01/2022   K 3.7 02/01/2022   CL 92 (L) 02/01/2022   CO2 30 02/01/2022   Lab Results  Component Value Date   ALT 10 02/01/2022   AST 17 02/01/2022   ALKPHOS 94 02/01/2022   BILITOT 0.8 02/01/2022   No results found for: "CHOL", "HDL",  "LDLCALC", "LDLDIRECT", "TRIG", "CHOLHDL"  No results found for: "HGBA1C"  Assessment & Plan    1. Chronic diastolic heart failure: Echocardiogram in 2021 showed EF 50 to 55%, G1 DD, small pericardial effusion.  Euvolemic and well compensated on exam.Fluid volume management per HD.  2. Labile hypertension: History of very labile BP.  BP is elevated in office today, she reports it has been well-controlled at home.  Continue to monitor.  Continue hydralazine  for SBP greater than 160 on nondialysis days.  3. Hyperlipidemia: LDL was 125 in 11/2022.  She is not on statin therapy.  Monitor per PCP.  4. LBBB: Stable on today's EKG. Denies any concerning symptoms.  5. Hypothyroidism: TSH was 2.52 in 11/2022.  Monitor managed per PCP.  On levothyroxine .  6. ESRD on HD: Following with nephrology.   7. Disposition: Follow-up in 6 months, sooner if needed.  HYPERTENSION CONTROL Vitals:   09/21/23 1338 09/21/23 1412  BP: (!) 160/81 (!) 150/76    The patient's blood pressure is elevated above target today.  In order to address the patient's elevated BP: Blood pressure will be monitored at home to determine if medication changes need to be made.      Jude Norton, NP 09/21/2023, 2:16  PM

## 2023-09-22 DIAGNOSIS — D631 Anemia in chronic kidney disease: Secondary | ICD-10-CM | POA: Diagnosis not present

## 2023-09-22 DIAGNOSIS — N2581 Secondary hyperparathyroidism of renal origin: Secondary | ICD-10-CM | POA: Diagnosis not present

## 2023-09-22 DIAGNOSIS — Z992 Dependence on renal dialysis: Secondary | ICD-10-CM | POA: Diagnosis not present

## 2023-09-22 DIAGNOSIS — N186 End stage renal disease: Secondary | ICD-10-CM | POA: Diagnosis not present

## 2023-09-24 DIAGNOSIS — N186 End stage renal disease: Secondary | ICD-10-CM | POA: Diagnosis not present

## 2023-09-24 DIAGNOSIS — D631 Anemia in chronic kidney disease: Secondary | ICD-10-CM | POA: Diagnosis not present

## 2023-09-24 DIAGNOSIS — Z992 Dependence on renal dialysis: Secondary | ICD-10-CM | POA: Diagnosis not present

## 2023-09-24 DIAGNOSIS — N2581 Secondary hyperparathyroidism of renal origin: Secondary | ICD-10-CM | POA: Diagnosis not present

## 2023-09-26 DIAGNOSIS — N2581 Secondary hyperparathyroidism of renal origin: Secondary | ICD-10-CM | POA: Diagnosis not present

## 2023-09-26 DIAGNOSIS — N186 End stage renal disease: Secondary | ICD-10-CM | POA: Diagnosis not present

## 2023-09-26 DIAGNOSIS — D631 Anemia in chronic kidney disease: Secondary | ICD-10-CM | POA: Diagnosis not present

## 2023-09-26 DIAGNOSIS — Z992 Dependence on renal dialysis: Secondary | ICD-10-CM | POA: Diagnosis not present

## 2023-09-29 DIAGNOSIS — D631 Anemia in chronic kidney disease: Secondary | ICD-10-CM | POA: Diagnosis not present

## 2023-09-29 DIAGNOSIS — Z992 Dependence on renal dialysis: Secondary | ICD-10-CM | POA: Diagnosis not present

## 2023-09-29 DIAGNOSIS — N2581 Secondary hyperparathyroidism of renal origin: Secondary | ICD-10-CM | POA: Diagnosis not present

## 2023-09-29 DIAGNOSIS — N186 End stage renal disease: Secondary | ICD-10-CM | POA: Diagnosis not present

## 2023-10-01 DIAGNOSIS — D631 Anemia in chronic kidney disease: Secondary | ICD-10-CM | POA: Diagnosis not present

## 2023-10-01 DIAGNOSIS — N2581 Secondary hyperparathyroidism of renal origin: Secondary | ICD-10-CM | POA: Diagnosis not present

## 2023-10-01 DIAGNOSIS — N186 End stage renal disease: Secondary | ICD-10-CM | POA: Diagnosis not present

## 2023-10-01 DIAGNOSIS — Z992 Dependence on renal dialysis: Secondary | ICD-10-CM | POA: Diagnosis not present

## 2023-10-03 DIAGNOSIS — Z992 Dependence on renal dialysis: Secondary | ICD-10-CM | POA: Diagnosis not present

## 2023-10-03 DIAGNOSIS — N186 End stage renal disease: Secondary | ICD-10-CM | POA: Diagnosis not present

## 2023-10-03 DIAGNOSIS — D631 Anemia in chronic kidney disease: Secondary | ICD-10-CM | POA: Diagnosis not present

## 2023-10-03 DIAGNOSIS — N2581 Secondary hyperparathyroidism of renal origin: Secondary | ICD-10-CM | POA: Diagnosis not present

## 2023-10-04 DIAGNOSIS — I129 Hypertensive chronic kidney disease with stage 1 through stage 4 chronic kidney disease, or unspecified chronic kidney disease: Secondary | ICD-10-CM | POA: Diagnosis not present

## 2023-10-04 DIAGNOSIS — Z992 Dependence on renal dialysis: Secondary | ICD-10-CM | POA: Diagnosis not present

## 2023-10-04 DIAGNOSIS — N186 End stage renal disease: Secondary | ICD-10-CM | POA: Diagnosis not present

## 2023-10-06 DIAGNOSIS — D631 Anemia in chronic kidney disease: Secondary | ICD-10-CM | POA: Diagnosis not present

## 2023-10-06 DIAGNOSIS — N2581 Secondary hyperparathyroidism of renal origin: Secondary | ICD-10-CM | POA: Diagnosis not present

## 2023-10-06 DIAGNOSIS — Z992 Dependence on renal dialysis: Secondary | ICD-10-CM | POA: Diagnosis not present

## 2023-10-06 DIAGNOSIS — N186 End stage renal disease: Secondary | ICD-10-CM | POA: Diagnosis not present

## 2023-10-08 DIAGNOSIS — N2581 Secondary hyperparathyroidism of renal origin: Secondary | ICD-10-CM | POA: Diagnosis not present

## 2023-10-08 DIAGNOSIS — N186 End stage renal disease: Secondary | ICD-10-CM | POA: Diagnosis not present

## 2023-10-08 DIAGNOSIS — D631 Anemia in chronic kidney disease: Secondary | ICD-10-CM | POA: Diagnosis not present

## 2023-10-08 DIAGNOSIS — Z992 Dependence on renal dialysis: Secondary | ICD-10-CM | POA: Diagnosis not present

## 2023-10-10 DIAGNOSIS — Z992 Dependence on renal dialysis: Secondary | ICD-10-CM | POA: Diagnosis not present

## 2023-10-10 DIAGNOSIS — N186 End stage renal disease: Secondary | ICD-10-CM | POA: Diagnosis not present

## 2023-10-10 DIAGNOSIS — D631 Anemia in chronic kidney disease: Secondary | ICD-10-CM | POA: Diagnosis not present

## 2023-10-10 DIAGNOSIS — N2581 Secondary hyperparathyroidism of renal origin: Secondary | ICD-10-CM | POA: Diagnosis not present

## 2023-10-13 ENCOUNTER — Other Ambulatory Visit: Payer: Self-pay | Admitting: Physician Assistant

## 2023-10-13 ENCOUNTER — Ambulatory Visit
Admission: RE | Admit: 2023-10-13 | Discharge: 2023-10-13 | Disposition: A | Discharge status: Home / Self Care | Source: Ambulatory Visit | Attending: Physician Assistant

## 2023-10-13 DIAGNOSIS — M25551 Pain in right hip: Secondary | ICD-10-CM

## 2023-10-13 DIAGNOSIS — M25561 Pain in right knee: Secondary | ICD-10-CM | POA: Diagnosis not present

## 2023-10-13 DIAGNOSIS — M1611 Unilateral primary osteoarthritis, right hip: Secondary | ICD-10-CM | POA: Diagnosis not present

## 2023-10-14 DIAGNOSIS — N186 End stage renal disease: Secondary | ICD-10-CM | POA: Diagnosis not present

## 2023-10-14 DIAGNOSIS — D631 Anemia in chronic kidney disease: Secondary | ICD-10-CM | POA: Diagnosis not present

## 2023-10-14 DIAGNOSIS — N2581 Secondary hyperparathyroidism of renal origin: Secondary | ICD-10-CM | POA: Diagnosis not present

## 2023-10-14 DIAGNOSIS — Z992 Dependence on renal dialysis: Secondary | ICD-10-CM | POA: Diagnosis not present

## 2023-10-15 DIAGNOSIS — N2581 Secondary hyperparathyroidism of renal origin: Secondary | ICD-10-CM | POA: Diagnosis not present

## 2023-10-15 DIAGNOSIS — N186 End stage renal disease: Secondary | ICD-10-CM | POA: Diagnosis not present

## 2023-10-15 DIAGNOSIS — Z992 Dependence on renal dialysis: Secondary | ICD-10-CM | POA: Diagnosis not present

## 2023-10-15 DIAGNOSIS — D631 Anemia in chronic kidney disease: Secondary | ICD-10-CM | POA: Diagnosis not present

## 2023-10-17 DIAGNOSIS — N2581 Secondary hyperparathyroidism of renal origin: Secondary | ICD-10-CM | POA: Diagnosis not present

## 2023-10-17 DIAGNOSIS — N186 End stage renal disease: Secondary | ICD-10-CM | POA: Diagnosis not present

## 2023-10-17 DIAGNOSIS — Z992 Dependence on renal dialysis: Secondary | ICD-10-CM | POA: Diagnosis not present

## 2023-10-17 DIAGNOSIS — D631 Anemia in chronic kidney disease: Secondary | ICD-10-CM | POA: Diagnosis not present

## 2023-10-20 DIAGNOSIS — D631 Anemia in chronic kidney disease: Secondary | ICD-10-CM | POA: Diagnosis not present

## 2023-10-20 DIAGNOSIS — N186 End stage renal disease: Secondary | ICD-10-CM | POA: Diagnosis not present

## 2023-10-20 DIAGNOSIS — Z992 Dependence on renal dialysis: Secondary | ICD-10-CM | POA: Diagnosis not present

## 2023-10-20 DIAGNOSIS — N2581 Secondary hyperparathyroidism of renal origin: Secondary | ICD-10-CM | POA: Diagnosis not present

## 2023-10-22 DIAGNOSIS — N2581 Secondary hyperparathyroidism of renal origin: Secondary | ICD-10-CM | POA: Diagnosis not present

## 2023-10-22 DIAGNOSIS — D631 Anemia in chronic kidney disease: Secondary | ICD-10-CM | POA: Diagnosis not present

## 2023-10-22 DIAGNOSIS — N186 End stage renal disease: Secondary | ICD-10-CM | POA: Diagnosis not present

## 2023-10-22 DIAGNOSIS — Z992 Dependence on renal dialysis: Secondary | ICD-10-CM | POA: Diagnosis not present

## 2023-10-24 DIAGNOSIS — Z992 Dependence on renal dialysis: Secondary | ICD-10-CM | POA: Diagnosis not present

## 2023-10-24 DIAGNOSIS — D631 Anemia in chronic kidney disease: Secondary | ICD-10-CM | POA: Diagnosis not present

## 2023-10-24 DIAGNOSIS — N2581 Secondary hyperparathyroidism of renal origin: Secondary | ICD-10-CM | POA: Diagnosis not present

## 2023-10-24 DIAGNOSIS — N186 End stage renal disease: Secondary | ICD-10-CM | POA: Diagnosis not present

## 2023-10-27 DIAGNOSIS — Z992 Dependence on renal dialysis: Secondary | ICD-10-CM | POA: Diagnosis not present

## 2023-10-27 DIAGNOSIS — N186 End stage renal disease: Secondary | ICD-10-CM | POA: Diagnosis not present

## 2023-10-27 DIAGNOSIS — N2581 Secondary hyperparathyroidism of renal origin: Secondary | ICD-10-CM | POA: Diagnosis not present

## 2023-10-27 DIAGNOSIS — D631 Anemia in chronic kidney disease: Secondary | ICD-10-CM | POA: Diagnosis not present

## 2023-10-29 DIAGNOSIS — D631 Anemia in chronic kidney disease: Secondary | ICD-10-CM | POA: Diagnosis not present

## 2023-10-29 DIAGNOSIS — Z992 Dependence on renal dialysis: Secondary | ICD-10-CM | POA: Diagnosis not present

## 2023-10-29 DIAGNOSIS — N186 End stage renal disease: Secondary | ICD-10-CM | POA: Diagnosis not present

## 2023-10-29 DIAGNOSIS — N2581 Secondary hyperparathyroidism of renal origin: Secondary | ICD-10-CM | POA: Diagnosis not present

## 2023-10-30 DIAGNOSIS — M25561 Pain in right knee: Secondary | ICD-10-CM | POA: Diagnosis not present

## 2023-10-30 DIAGNOSIS — M25551 Pain in right hip: Secondary | ICD-10-CM | POA: Diagnosis not present

## 2023-10-31 DIAGNOSIS — N186 End stage renal disease: Secondary | ICD-10-CM | POA: Diagnosis not present

## 2023-10-31 DIAGNOSIS — D631 Anemia in chronic kidney disease: Secondary | ICD-10-CM | POA: Diagnosis not present

## 2023-10-31 DIAGNOSIS — N2581 Secondary hyperparathyroidism of renal origin: Secondary | ICD-10-CM | POA: Diagnosis not present

## 2023-10-31 DIAGNOSIS — Z992 Dependence on renal dialysis: Secondary | ICD-10-CM | POA: Diagnosis not present

## 2023-11-03 DIAGNOSIS — D631 Anemia in chronic kidney disease: Secondary | ICD-10-CM | POA: Diagnosis not present

## 2023-11-03 DIAGNOSIS — N186 End stage renal disease: Secondary | ICD-10-CM | POA: Diagnosis not present

## 2023-11-03 DIAGNOSIS — I129 Hypertensive chronic kidney disease with stage 1 through stage 4 chronic kidney disease, or unspecified chronic kidney disease: Secondary | ICD-10-CM | POA: Diagnosis not present

## 2023-11-03 DIAGNOSIS — N2581 Secondary hyperparathyroidism of renal origin: Secondary | ICD-10-CM | POA: Diagnosis not present

## 2023-11-03 DIAGNOSIS — Z992 Dependence on renal dialysis: Secondary | ICD-10-CM | POA: Diagnosis not present

## 2023-11-05 DIAGNOSIS — N2581 Secondary hyperparathyroidism of renal origin: Secondary | ICD-10-CM | POA: Diagnosis not present

## 2023-11-05 DIAGNOSIS — D631 Anemia in chronic kidney disease: Secondary | ICD-10-CM | POA: Diagnosis not present

## 2023-11-05 DIAGNOSIS — Z992 Dependence on renal dialysis: Secondary | ICD-10-CM | POA: Diagnosis not present

## 2023-11-05 DIAGNOSIS — N186 End stage renal disease: Secondary | ICD-10-CM | POA: Diagnosis not present

## 2023-11-07 DIAGNOSIS — Z992 Dependence on renal dialysis: Secondary | ICD-10-CM | POA: Diagnosis not present

## 2023-11-07 DIAGNOSIS — N2581 Secondary hyperparathyroidism of renal origin: Secondary | ICD-10-CM | POA: Diagnosis not present

## 2023-11-07 DIAGNOSIS — N186 End stage renal disease: Secondary | ICD-10-CM | POA: Diagnosis not present

## 2023-11-07 DIAGNOSIS — D631 Anemia in chronic kidney disease: Secondary | ICD-10-CM | POA: Diagnosis not present

## 2023-11-10 DIAGNOSIS — N186 End stage renal disease: Secondary | ICD-10-CM | POA: Diagnosis not present

## 2023-11-10 DIAGNOSIS — Z992 Dependence on renal dialysis: Secondary | ICD-10-CM | POA: Diagnosis not present

## 2023-11-10 DIAGNOSIS — D631 Anemia in chronic kidney disease: Secondary | ICD-10-CM | POA: Diagnosis not present

## 2023-11-10 DIAGNOSIS — N2581 Secondary hyperparathyroidism of renal origin: Secondary | ICD-10-CM | POA: Diagnosis not present

## 2023-11-11 DIAGNOSIS — M25551 Pain in right hip: Secondary | ICD-10-CM | POA: Diagnosis not present

## 2023-11-11 DIAGNOSIS — M25661 Stiffness of right knee, not elsewhere classified: Secondary | ICD-10-CM | POA: Diagnosis not present

## 2023-11-11 DIAGNOSIS — M25561 Pain in right knee: Secondary | ICD-10-CM | POA: Diagnosis not present

## 2023-11-11 DIAGNOSIS — M25651 Stiffness of right hip, not elsewhere classified: Secondary | ICD-10-CM | POA: Diagnosis not present

## 2023-11-12 DIAGNOSIS — Z992 Dependence on renal dialysis: Secondary | ICD-10-CM | POA: Diagnosis not present

## 2023-11-12 DIAGNOSIS — N186 End stage renal disease: Secondary | ICD-10-CM | POA: Diagnosis not present

## 2023-11-12 DIAGNOSIS — N2581 Secondary hyperparathyroidism of renal origin: Secondary | ICD-10-CM | POA: Diagnosis not present

## 2023-11-12 DIAGNOSIS — D631 Anemia in chronic kidney disease: Secondary | ICD-10-CM | POA: Diagnosis not present

## 2023-11-14 DIAGNOSIS — Z992 Dependence on renal dialysis: Secondary | ICD-10-CM | POA: Diagnosis not present

## 2023-11-14 DIAGNOSIS — N2581 Secondary hyperparathyroidism of renal origin: Secondary | ICD-10-CM | POA: Diagnosis not present

## 2023-11-14 DIAGNOSIS — D631 Anemia in chronic kidney disease: Secondary | ICD-10-CM | POA: Diagnosis not present

## 2023-11-14 DIAGNOSIS — N186 End stage renal disease: Secondary | ICD-10-CM | POA: Diagnosis not present

## 2023-11-16 DIAGNOSIS — N186 End stage renal disease: Secondary | ICD-10-CM | POA: Diagnosis not present

## 2023-11-16 DIAGNOSIS — E039 Hypothyroidism, unspecified: Secondary | ICD-10-CM | POA: Diagnosis not present

## 2023-11-16 DIAGNOSIS — I132 Hypertensive heart and chronic kidney disease with heart failure and with stage 5 chronic kidney disease, or end stage renal disease: Secondary | ICD-10-CM | POA: Diagnosis not present

## 2023-11-16 DIAGNOSIS — Z992 Dependence on renal dialysis: Secondary | ICD-10-CM | POA: Diagnosis not present

## 2023-11-16 DIAGNOSIS — E785 Hyperlipidemia, unspecified: Secondary | ICD-10-CM | POA: Diagnosis not present

## 2023-11-16 DIAGNOSIS — D638 Anemia in other chronic diseases classified elsewhere: Secondary | ICD-10-CM | POA: Diagnosis not present

## 2023-11-16 DIAGNOSIS — I5032 Chronic diastolic (congestive) heart failure: Secondary | ICD-10-CM | POA: Diagnosis not present

## 2023-11-16 DIAGNOSIS — M810 Age-related osteoporosis without current pathological fracture: Secondary | ICD-10-CM | POA: Diagnosis not present

## 2023-11-17 DIAGNOSIS — N186 End stage renal disease: Secondary | ICD-10-CM | POA: Diagnosis not present

## 2023-11-17 DIAGNOSIS — D631 Anemia in chronic kidney disease: Secondary | ICD-10-CM | POA: Diagnosis not present

## 2023-11-17 DIAGNOSIS — N2581 Secondary hyperparathyroidism of renal origin: Secondary | ICD-10-CM | POA: Diagnosis not present

## 2023-11-17 DIAGNOSIS — Z992 Dependence on renal dialysis: Secondary | ICD-10-CM | POA: Diagnosis not present

## 2023-11-18 DIAGNOSIS — M25661 Stiffness of right knee, not elsewhere classified: Secondary | ICD-10-CM | POA: Diagnosis not present

## 2023-11-18 DIAGNOSIS — M25561 Pain in right knee: Secondary | ICD-10-CM | POA: Diagnosis not present

## 2023-11-18 DIAGNOSIS — M25651 Stiffness of right hip, not elsewhere classified: Secondary | ICD-10-CM | POA: Diagnosis not present

## 2023-11-18 DIAGNOSIS — M25551 Pain in right hip: Secondary | ICD-10-CM | POA: Diagnosis not present

## 2023-11-19 DIAGNOSIS — N186 End stage renal disease: Secondary | ICD-10-CM | POA: Diagnosis not present

## 2023-11-19 DIAGNOSIS — N2581 Secondary hyperparathyroidism of renal origin: Secondary | ICD-10-CM | POA: Diagnosis not present

## 2023-11-19 DIAGNOSIS — D631 Anemia in chronic kidney disease: Secondary | ICD-10-CM | POA: Diagnosis not present

## 2023-11-19 DIAGNOSIS — Z992 Dependence on renal dialysis: Secondary | ICD-10-CM | POA: Diagnosis not present

## 2023-11-20 DIAGNOSIS — M25661 Stiffness of right knee, not elsewhere classified: Secondary | ICD-10-CM | POA: Diagnosis not present

## 2023-11-20 DIAGNOSIS — M25551 Pain in right hip: Secondary | ICD-10-CM | POA: Diagnosis not present

## 2023-11-20 DIAGNOSIS — M25651 Stiffness of right hip, not elsewhere classified: Secondary | ICD-10-CM | POA: Diagnosis not present

## 2023-11-20 DIAGNOSIS — M25561 Pain in right knee: Secondary | ICD-10-CM | POA: Diagnosis not present

## 2023-11-21 DIAGNOSIS — Z992 Dependence on renal dialysis: Secondary | ICD-10-CM | POA: Diagnosis not present

## 2023-11-21 DIAGNOSIS — D631 Anemia in chronic kidney disease: Secondary | ICD-10-CM | POA: Diagnosis not present

## 2023-11-21 DIAGNOSIS — N186 End stage renal disease: Secondary | ICD-10-CM | POA: Diagnosis not present

## 2023-11-21 DIAGNOSIS — N2581 Secondary hyperparathyroidism of renal origin: Secondary | ICD-10-CM | POA: Diagnosis not present

## 2023-11-23 DIAGNOSIS — E039 Hypothyroidism, unspecified: Secondary | ICD-10-CM | POA: Diagnosis not present

## 2023-11-23 DIAGNOSIS — I5032 Chronic diastolic (congestive) heart failure: Secondary | ICD-10-CM | POA: Diagnosis not present

## 2023-11-23 DIAGNOSIS — Z992 Dependence on renal dialysis: Secondary | ICD-10-CM | POA: Diagnosis not present

## 2023-11-23 DIAGNOSIS — Z0000 Encounter for general adult medical examination without abnormal findings: Secondary | ICD-10-CM | POA: Diagnosis not present

## 2023-11-23 DIAGNOSIS — I132 Hypertensive heart and chronic kidney disease with heart failure and with stage 5 chronic kidney disease, or end stage renal disease: Secondary | ICD-10-CM | POA: Diagnosis not present

## 2023-11-23 DIAGNOSIS — Z1339 Encounter for screening examination for other mental health and behavioral disorders: Secondary | ICD-10-CM | POA: Diagnosis not present

## 2023-11-23 DIAGNOSIS — Z1331 Encounter for screening for depression: Secondary | ICD-10-CM | POA: Diagnosis not present

## 2023-11-23 DIAGNOSIS — R82998 Other abnormal findings in urine: Secondary | ICD-10-CM | POA: Diagnosis not present

## 2023-11-23 DIAGNOSIS — N2581 Secondary hyperparathyroidism of renal origin: Secondary | ICD-10-CM | POA: Diagnosis not present

## 2023-11-23 DIAGNOSIS — M810 Age-related osteoporosis without current pathological fracture: Secondary | ICD-10-CM | POA: Diagnosis not present

## 2023-11-23 DIAGNOSIS — N186 End stage renal disease: Secondary | ICD-10-CM | POA: Diagnosis not present

## 2023-11-23 DIAGNOSIS — K219 Gastro-esophageal reflux disease without esophagitis: Secondary | ICD-10-CM | POA: Diagnosis not present

## 2023-11-23 DIAGNOSIS — D638 Anemia in other chronic diseases classified elsewhere: Secondary | ICD-10-CM | POA: Diagnosis not present

## 2023-11-24 DIAGNOSIS — D631 Anemia in chronic kidney disease: Secondary | ICD-10-CM | POA: Diagnosis not present

## 2023-11-24 DIAGNOSIS — N186 End stage renal disease: Secondary | ICD-10-CM | POA: Diagnosis not present

## 2023-11-24 DIAGNOSIS — N2581 Secondary hyperparathyroidism of renal origin: Secondary | ICD-10-CM | POA: Diagnosis not present

## 2023-11-24 DIAGNOSIS — Z992 Dependence on renal dialysis: Secondary | ICD-10-CM | POA: Diagnosis not present

## 2023-11-25 DIAGNOSIS — M25561 Pain in right knee: Secondary | ICD-10-CM | POA: Diagnosis not present

## 2023-11-25 DIAGNOSIS — M25661 Stiffness of right knee, not elsewhere classified: Secondary | ICD-10-CM | POA: Diagnosis not present

## 2023-11-25 DIAGNOSIS — M25551 Pain in right hip: Secondary | ICD-10-CM | POA: Diagnosis not present

## 2023-11-25 DIAGNOSIS — M25651 Stiffness of right hip, not elsewhere classified: Secondary | ICD-10-CM | POA: Diagnosis not present

## 2023-11-26 DIAGNOSIS — N186 End stage renal disease: Secondary | ICD-10-CM | POA: Diagnosis not present

## 2023-11-26 DIAGNOSIS — D631 Anemia in chronic kidney disease: Secondary | ICD-10-CM | POA: Diagnosis not present

## 2023-11-26 DIAGNOSIS — Z992 Dependence on renal dialysis: Secondary | ICD-10-CM | POA: Diagnosis not present

## 2023-11-26 DIAGNOSIS — N2581 Secondary hyperparathyroidism of renal origin: Secondary | ICD-10-CM | POA: Diagnosis not present

## 2023-11-27 DIAGNOSIS — M25551 Pain in right hip: Secondary | ICD-10-CM | POA: Diagnosis not present

## 2023-11-27 DIAGNOSIS — M25661 Stiffness of right knee, not elsewhere classified: Secondary | ICD-10-CM | POA: Diagnosis not present

## 2023-11-27 DIAGNOSIS — M25651 Stiffness of right hip, not elsewhere classified: Secondary | ICD-10-CM | POA: Diagnosis not present

## 2023-11-27 DIAGNOSIS — M25561 Pain in right knee: Secondary | ICD-10-CM | POA: Diagnosis not present

## 2023-11-28 DIAGNOSIS — N2581 Secondary hyperparathyroidism of renal origin: Secondary | ICD-10-CM | POA: Diagnosis not present

## 2023-11-28 DIAGNOSIS — D631 Anemia in chronic kidney disease: Secondary | ICD-10-CM | POA: Diagnosis not present

## 2023-11-28 DIAGNOSIS — Z992 Dependence on renal dialysis: Secondary | ICD-10-CM | POA: Diagnosis not present

## 2023-11-28 DIAGNOSIS — N186 End stage renal disease: Secondary | ICD-10-CM | POA: Diagnosis not present

## 2023-12-01 DIAGNOSIS — Z992 Dependence on renal dialysis: Secondary | ICD-10-CM | POA: Diagnosis not present

## 2023-12-01 DIAGNOSIS — D631 Anemia in chronic kidney disease: Secondary | ICD-10-CM | POA: Diagnosis not present

## 2023-12-01 DIAGNOSIS — N2581 Secondary hyperparathyroidism of renal origin: Secondary | ICD-10-CM | POA: Diagnosis not present

## 2023-12-01 DIAGNOSIS — N186 End stage renal disease: Secondary | ICD-10-CM | POA: Diagnosis not present

## 2023-12-02 DIAGNOSIS — M25551 Pain in right hip: Secondary | ICD-10-CM | POA: Diagnosis not present

## 2023-12-02 DIAGNOSIS — M25661 Stiffness of right knee, not elsewhere classified: Secondary | ICD-10-CM | POA: Diagnosis not present

## 2023-12-02 DIAGNOSIS — M25651 Stiffness of right hip, not elsewhere classified: Secondary | ICD-10-CM | POA: Diagnosis not present

## 2023-12-02 DIAGNOSIS — M25561 Pain in right knee: Secondary | ICD-10-CM | POA: Diagnosis not present

## 2023-12-03 DIAGNOSIS — N2581 Secondary hyperparathyroidism of renal origin: Secondary | ICD-10-CM | POA: Diagnosis not present

## 2023-12-03 DIAGNOSIS — Z992 Dependence on renal dialysis: Secondary | ICD-10-CM | POA: Diagnosis not present

## 2023-12-03 DIAGNOSIS — N186 End stage renal disease: Secondary | ICD-10-CM | POA: Diagnosis not present

## 2023-12-03 DIAGNOSIS — D631 Anemia in chronic kidney disease: Secondary | ICD-10-CM | POA: Diagnosis not present

## 2023-12-04 DIAGNOSIS — I129 Hypertensive chronic kidney disease with stage 1 through stage 4 chronic kidney disease, or unspecified chronic kidney disease: Secondary | ICD-10-CM | POA: Diagnosis not present

## 2023-12-04 DIAGNOSIS — Z992 Dependence on renal dialysis: Secondary | ICD-10-CM | POA: Diagnosis not present

## 2023-12-04 DIAGNOSIS — N186 End stage renal disease: Secondary | ICD-10-CM | POA: Diagnosis not present

## 2023-12-04 DIAGNOSIS — M6281 Muscle weakness (generalized): Secondary | ICD-10-CM | POA: Diagnosis not present

## 2023-12-05 DIAGNOSIS — N186 End stage renal disease: Secondary | ICD-10-CM | POA: Diagnosis not present

## 2023-12-05 DIAGNOSIS — N2581 Secondary hyperparathyroidism of renal origin: Secondary | ICD-10-CM | POA: Diagnosis not present

## 2023-12-05 DIAGNOSIS — Z992 Dependence on renal dialysis: Secondary | ICD-10-CM | POA: Diagnosis not present

## 2023-12-05 DIAGNOSIS — D631 Anemia in chronic kidney disease: Secondary | ICD-10-CM | POA: Diagnosis not present

## 2023-12-08 DIAGNOSIS — N2581 Secondary hyperparathyroidism of renal origin: Secondary | ICD-10-CM | POA: Diagnosis not present

## 2023-12-08 DIAGNOSIS — D631 Anemia in chronic kidney disease: Secondary | ICD-10-CM | POA: Diagnosis not present

## 2023-12-08 DIAGNOSIS — N186 End stage renal disease: Secondary | ICD-10-CM | POA: Diagnosis not present

## 2023-12-08 DIAGNOSIS — Z992 Dependence on renal dialysis: Secondary | ICD-10-CM | POA: Diagnosis not present

## 2023-12-09 DIAGNOSIS — M25661 Stiffness of right knee, not elsewhere classified: Secondary | ICD-10-CM | POA: Diagnosis not present

## 2023-12-09 DIAGNOSIS — M25651 Stiffness of right hip, not elsewhere classified: Secondary | ICD-10-CM | POA: Diagnosis not present

## 2023-12-09 DIAGNOSIS — M25561 Pain in right knee: Secondary | ICD-10-CM | POA: Diagnosis not present

## 2023-12-09 DIAGNOSIS — M25551 Pain in right hip: Secondary | ICD-10-CM | POA: Diagnosis not present

## 2023-12-10 DIAGNOSIS — N2581 Secondary hyperparathyroidism of renal origin: Secondary | ICD-10-CM | POA: Diagnosis not present

## 2023-12-10 DIAGNOSIS — Z992 Dependence on renal dialysis: Secondary | ICD-10-CM | POA: Diagnosis not present

## 2023-12-10 DIAGNOSIS — N186 End stage renal disease: Secondary | ICD-10-CM | POA: Diagnosis not present

## 2023-12-10 DIAGNOSIS — D631 Anemia in chronic kidney disease: Secondary | ICD-10-CM | POA: Diagnosis not present

## 2023-12-11 DIAGNOSIS — M25551 Pain in right hip: Secondary | ICD-10-CM | POA: Diagnosis not present

## 2023-12-11 DIAGNOSIS — M25561 Pain in right knee: Secondary | ICD-10-CM | POA: Diagnosis not present

## 2023-12-11 DIAGNOSIS — M25661 Stiffness of right knee, not elsewhere classified: Secondary | ICD-10-CM | POA: Diagnosis not present

## 2023-12-11 DIAGNOSIS — M25651 Stiffness of right hip, not elsewhere classified: Secondary | ICD-10-CM | POA: Diagnosis not present

## 2023-12-12 DIAGNOSIS — N186 End stage renal disease: Secondary | ICD-10-CM | POA: Diagnosis not present

## 2023-12-12 DIAGNOSIS — D631 Anemia in chronic kidney disease: Secondary | ICD-10-CM | POA: Diagnosis not present

## 2023-12-12 DIAGNOSIS — Z992 Dependence on renal dialysis: Secondary | ICD-10-CM | POA: Diagnosis not present

## 2023-12-12 DIAGNOSIS — N2581 Secondary hyperparathyroidism of renal origin: Secondary | ICD-10-CM | POA: Diagnosis not present

## 2023-12-15 DIAGNOSIS — D631 Anemia in chronic kidney disease: Secondary | ICD-10-CM | POA: Diagnosis not present

## 2023-12-15 DIAGNOSIS — Z992 Dependence on renal dialysis: Secondary | ICD-10-CM | POA: Diagnosis not present

## 2023-12-15 DIAGNOSIS — N186 End stage renal disease: Secondary | ICD-10-CM | POA: Diagnosis not present

## 2023-12-15 DIAGNOSIS — N2581 Secondary hyperparathyroidism of renal origin: Secondary | ICD-10-CM | POA: Diagnosis not present

## 2023-12-16 DIAGNOSIS — M25551 Pain in right hip: Secondary | ICD-10-CM | POA: Diagnosis not present

## 2023-12-16 DIAGNOSIS — M25651 Stiffness of right hip, not elsewhere classified: Secondary | ICD-10-CM | POA: Diagnosis not present

## 2023-12-16 DIAGNOSIS — M25661 Stiffness of right knee, not elsewhere classified: Secondary | ICD-10-CM | POA: Diagnosis not present

## 2023-12-16 DIAGNOSIS — M25561 Pain in right knee: Secondary | ICD-10-CM | POA: Diagnosis not present

## 2023-12-17 DIAGNOSIS — Z992 Dependence on renal dialysis: Secondary | ICD-10-CM | POA: Diagnosis not present

## 2023-12-17 DIAGNOSIS — N2581 Secondary hyperparathyroidism of renal origin: Secondary | ICD-10-CM | POA: Diagnosis not present

## 2023-12-17 DIAGNOSIS — N186 End stage renal disease: Secondary | ICD-10-CM | POA: Diagnosis not present

## 2023-12-17 DIAGNOSIS — D631 Anemia in chronic kidney disease: Secondary | ICD-10-CM | POA: Diagnosis not present

## 2023-12-18 DIAGNOSIS — M6281 Muscle weakness (generalized): Secondary | ICD-10-CM | POA: Diagnosis not present

## 2023-12-19 DIAGNOSIS — D631 Anemia in chronic kidney disease: Secondary | ICD-10-CM | POA: Diagnosis not present

## 2023-12-19 DIAGNOSIS — N186 End stage renal disease: Secondary | ICD-10-CM | POA: Diagnosis not present

## 2023-12-19 DIAGNOSIS — Z992 Dependence on renal dialysis: Secondary | ICD-10-CM | POA: Diagnosis not present

## 2023-12-19 DIAGNOSIS — N2581 Secondary hyperparathyroidism of renal origin: Secondary | ICD-10-CM | POA: Diagnosis not present

## 2023-12-22 DIAGNOSIS — Z992 Dependence on renal dialysis: Secondary | ICD-10-CM | POA: Diagnosis not present

## 2023-12-22 DIAGNOSIS — N2581 Secondary hyperparathyroidism of renal origin: Secondary | ICD-10-CM | POA: Diagnosis not present

## 2023-12-22 DIAGNOSIS — D631 Anemia in chronic kidney disease: Secondary | ICD-10-CM | POA: Diagnosis not present

## 2023-12-22 DIAGNOSIS — N186 End stage renal disease: Secondary | ICD-10-CM | POA: Diagnosis not present

## 2023-12-23 DIAGNOSIS — M6281 Muscle weakness (generalized): Secondary | ICD-10-CM | POA: Diagnosis not present

## 2023-12-24 DIAGNOSIS — N186 End stage renal disease: Secondary | ICD-10-CM | POA: Diagnosis not present

## 2023-12-24 DIAGNOSIS — Z992 Dependence on renal dialysis: Secondary | ICD-10-CM | POA: Diagnosis not present

## 2023-12-24 DIAGNOSIS — N2581 Secondary hyperparathyroidism of renal origin: Secondary | ICD-10-CM | POA: Diagnosis not present

## 2023-12-24 DIAGNOSIS — D631 Anemia in chronic kidney disease: Secondary | ICD-10-CM | POA: Diagnosis not present

## 2023-12-26 DIAGNOSIS — Z992 Dependence on renal dialysis: Secondary | ICD-10-CM | POA: Diagnosis not present

## 2023-12-26 DIAGNOSIS — N186 End stage renal disease: Secondary | ICD-10-CM | POA: Diagnosis not present

## 2023-12-26 DIAGNOSIS — N2581 Secondary hyperparathyroidism of renal origin: Secondary | ICD-10-CM | POA: Diagnosis not present

## 2023-12-26 DIAGNOSIS — D631 Anemia in chronic kidney disease: Secondary | ICD-10-CM | POA: Diagnosis not present

## 2023-12-29 DIAGNOSIS — D631 Anemia in chronic kidney disease: Secondary | ICD-10-CM | POA: Diagnosis not present

## 2023-12-29 DIAGNOSIS — N186 End stage renal disease: Secondary | ICD-10-CM | POA: Diagnosis not present

## 2023-12-29 DIAGNOSIS — Z992 Dependence on renal dialysis: Secondary | ICD-10-CM | POA: Diagnosis not present

## 2023-12-29 DIAGNOSIS — N2581 Secondary hyperparathyroidism of renal origin: Secondary | ICD-10-CM | POA: Diagnosis not present

## 2023-12-31 DIAGNOSIS — D631 Anemia in chronic kidney disease: Secondary | ICD-10-CM | POA: Diagnosis not present

## 2023-12-31 DIAGNOSIS — N2581 Secondary hyperparathyroidism of renal origin: Secondary | ICD-10-CM | POA: Diagnosis not present

## 2023-12-31 DIAGNOSIS — Z992 Dependence on renal dialysis: Secondary | ICD-10-CM | POA: Diagnosis not present

## 2023-12-31 DIAGNOSIS — N186 End stage renal disease: Secondary | ICD-10-CM | POA: Diagnosis not present

## 2024-01-02 DIAGNOSIS — N186 End stage renal disease: Secondary | ICD-10-CM | POA: Diagnosis not present

## 2024-01-02 DIAGNOSIS — N2581 Secondary hyperparathyroidism of renal origin: Secondary | ICD-10-CM | POA: Diagnosis not present

## 2024-01-02 DIAGNOSIS — D631 Anemia in chronic kidney disease: Secondary | ICD-10-CM | POA: Diagnosis not present

## 2024-01-02 DIAGNOSIS — Z992 Dependence on renal dialysis: Secondary | ICD-10-CM | POA: Diagnosis not present

## 2024-01-04 DIAGNOSIS — N186 End stage renal disease: Secondary | ICD-10-CM | POA: Diagnosis not present

## 2024-01-04 DIAGNOSIS — Z992 Dependence on renal dialysis: Secondary | ICD-10-CM | POA: Diagnosis not present

## 2024-01-04 DIAGNOSIS — I129 Hypertensive chronic kidney disease with stage 1 through stage 4 chronic kidney disease, or unspecified chronic kidney disease: Secondary | ICD-10-CM | POA: Diagnosis not present

## 2024-01-05 DIAGNOSIS — N2581 Secondary hyperparathyroidism of renal origin: Secondary | ICD-10-CM | POA: Diagnosis not present

## 2024-01-05 DIAGNOSIS — D631 Anemia in chronic kidney disease: Secondary | ICD-10-CM | POA: Diagnosis not present

## 2024-01-05 DIAGNOSIS — Z992 Dependence on renal dialysis: Secondary | ICD-10-CM | POA: Diagnosis not present

## 2024-01-05 DIAGNOSIS — N186 End stage renal disease: Secondary | ICD-10-CM | POA: Diagnosis not present

## 2024-01-07 DIAGNOSIS — N186 End stage renal disease: Secondary | ICD-10-CM | POA: Diagnosis not present

## 2024-01-07 DIAGNOSIS — Z992 Dependence on renal dialysis: Secondary | ICD-10-CM | POA: Diagnosis not present

## 2024-01-07 DIAGNOSIS — D631 Anemia in chronic kidney disease: Secondary | ICD-10-CM | POA: Diagnosis not present

## 2024-01-07 DIAGNOSIS — N2581 Secondary hyperparathyroidism of renal origin: Secondary | ICD-10-CM | POA: Diagnosis not present

## 2024-01-09 DIAGNOSIS — Z992 Dependence on renal dialysis: Secondary | ICD-10-CM | POA: Diagnosis not present

## 2024-01-09 DIAGNOSIS — D631 Anemia in chronic kidney disease: Secondary | ICD-10-CM | POA: Diagnosis not present

## 2024-01-09 DIAGNOSIS — N2581 Secondary hyperparathyroidism of renal origin: Secondary | ICD-10-CM | POA: Diagnosis not present

## 2024-01-09 DIAGNOSIS — N186 End stage renal disease: Secondary | ICD-10-CM | POA: Diagnosis not present

## 2024-01-12 DIAGNOSIS — N2581 Secondary hyperparathyroidism of renal origin: Secondary | ICD-10-CM | POA: Diagnosis not present

## 2024-01-12 DIAGNOSIS — D631 Anemia in chronic kidney disease: Secondary | ICD-10-CM | POA: Diagnosis not present

## 2024-01-12 DIAGNOSIS — N186 End stage renal disease: Secondary | ICD-10-CM | POA: Diagnosis not present

## 2024-01-12 DIAGNOSIS — Z992 Dependence on renal dialysis: Secondary | ICD-10-CM | POA: Diagnosis not present

## 2024-01-14 DIAGNOSIS — Z992 Dependence on renal dialysis: Secondary | ICD-10-CM | POA: Diagnosis not present

## 2024-01-14 DIAGNOSIS — D631 Anemia in chronic kidney disease: Secondary | ICD-10-CM | POA: Diagnosis not present

## 2024-01-14 DIAGNOSIS — N186 End stage renal disease: Secondary | ICD-10-CM | POA: Diagnosis not present

## 2024-01-14 DIAGNOSIS — N2581 Secondary hyperparathyroidism of renal origin: Secondary | ICD-10-CM | POA: Diagnosis not present

## 2024-01-16 DIAGNOSIS — D631 Anemia in chronic kidney disease: Secondary | ICD-10-CM | POA: Diagnosis not present

## 2024-01-16 DIAGNOSIS — N2581 Secondary hyperparathyroidism of renal origin: Secondary | ICD-10-CM | POA: Diagnosis not present

## 2024-01-16 DIAGNOSIS — Z992 Dependence on renal dialysis: Secondary | ICD-10-CM | POA: Diagnosis not present

## 2024-01-16 DIAGNOSIS — N186 End stage renal disease: Secondary | ICD-10-CM | POA: Diagnosis not present

## 2024-01-19 DIAGNOSIS — Z992 Dependence on renal dialysis: Secondary | ICD-10-CM | POA: Diagnosis not present

## 2024-01-19 DIAGNOSIS — D631 Anemia in chronic kidney disease: Secondary | ICD-10-CM | POA: Diagnosis not present

## 2024-01-19 DIAGNOSIS — N186 End stage renal disease: Secondary | ICD-10-CM | POA: Diagnosis not present

## 2024-01-19 DIAGNOSIS — N2581 Secondary hyperparathyroidism of renal origin: Secondary | ICD-10-CM | POA: Diagnosis not present

## 2024-01-21 DIAGNOSIS — Z992 Dependence on renal dialysis: Secondary | ICD-10-CM | POA: Diagnosis not present

## 2024-01-21 DIAGNOSIS — N2581 Secondary hyperparathyroidism of renal origin: Secondary | ICD-10-CM | POA: Diagnosis not present

## 2024-01-21 DIAGNOSIS — N186 End stage renal disease: Secondary | ICD-10-CM | POA: Diagnosis not present

## 2024-01-21 DIAGNOSIS — D631 Anemia in chronic kidney disease: Secondary | ICD-10-CM | POA: Diagnosis not present

## 2024-01-23 DIAGNOSIS — N2581 Secondary hyperparathyroidism of renal origin: Secondary | ICD-10-CM | POA: Diagnosis not present

## 2024-01-23 DIAGNOSIS — N186 End stage renal disease: Secondary | ICD-10-CM | POA: Diagnosis not present

## 2024-01-23 DIAGNOSIS — Z992 Dependence on renal dialysis: Secondary | ICD-10-CM | POA: Diagnosis not present

## 2024-01-23 DIAGNOSIS — D631 Anemia in chronic kidney disease: Secondary | ICD-10-CM | POA: Diagnosis not present

## 2024-01-26 DIAGNOSIS — N186 End stage renal disease: Secondary | ICD-10-CM | POA: Diagnosis not present

## 2024-01-26 DIAGNOSIS — Z992 Dependence on renal dialysis: Secondary | ICD-10-CM | POA: Diagnosis not present

## 2024-01-26 DIAGNOSIS — N2581 Secondary hyperparathyroidism of renal origin: Secondary | ICD-10-CM | POA: Diagnosis not present

## 2024-01-26 DIAGNOSIS — D631 Anemia in chronic kidney disease: Secondary | ICD-10-CM | POA: Diagnosis not present

## 2024-01-28 DIAGNOSIS — Z992 Dependence on renal dialysis: Secondary | ICD-10-CM | POA: Diagnosis not present

## 2024-01-28 DIAGNOSIS — N186 End stage renal disease: Secondary | ICD-10-CM | POA: Diagnosis not present

## 2024-01-28 DIAGNOSIS — D631 Anemia in chronic kidney disease: Secondary | ICD-10-CM | POA: Diagnosis not present

## 2024-01-28 DIAGNOSIS — N2581 Secondary hyperparathyroidism of renal origin: Secondary | ICD-10-CM | POA: Diagnosis not present

## 2024-01-30 DIAGNOSIS — N186 End stage renal disease: Secondary | ICD-10-CM | POA: Diagnosis not present

## 2024-01-30 DIAGNOSIS — D631 Anemia in chronic kidney disease: Secondary | ICD-10-CM | POA: Diagnosis not present

## 2024-01-30 DIAGNOSIS — Z992 Dependence on renal dialysis: Secondary | ICD-10-CM | POA: Diagnosis not present

## 2024-01-30 DIAGNOSIS — N2581 Secondary hyperparathyroidism of renal origin: Secondary | ICD-10-CM | POA: Diagnosis not present

## 2024-02-02 DIAGNOSIS — N186 End stage renal disease: Secondary | ICD-10-CM | POA: Diagnosis not present

## 2024-02-02 DIAGNOSIS — Z992 Dependence on renal dialysis: Secondary | ICD-10-CM | POA: Diagnosis not present

## 2024-02-02 DIAGNOSIS — N2581 Secondary hyperparathyroidism of renal origin: Secondary | ICD-10-CM | POA: Diagnosis not present

## 2024-02-02 DIAGNOSIS — D631 Anemia in chronic kidney disease: Secondary | ICD-10-CM | POA: Diagnosis not present

## 2024-02-03 DIAGNOSIS — Z992 Dependence on renal dialysis: Secondary | ICD-10-CM | POA: Diagnosis not present

## 2024-02-03 DIAGNOSIS — N186 End stage renal disease: Secondary | ICD-10-CM | POA: Diagnosis not present

## 2024-02-03 DIAGNOSIS — I129 Hypertensive chronic kidney disease with stage 1 through stage 4 chronic kidney disease, or unspecified chronic kidney disease: Secondary | ICD-10-CM | POA: Diagnosis not present

## 2024-02-04 DIAGNOSIS — N2581 Secondary hyperparathyroidism of renal origin: Secondary | ICD-10-CM | POA: Diagnosis not present

## 2024-02-04 DIAGNOSIS — N186 End stage renal disease: Secondary | ICD-10-CM | POA: Diagnosis not present

## 2024-02-04 DIAGNOSIS — Z992 Dependence on renal dialysis: Secondary | ICD-10-CM | POA: Diagnosis not present

## 2024-02-04 DIAGNOSIS — D631 Anemia in chronic kidney disease: Secondary | ICD-10-CM | POA: Diagnosis not present

## 2024-02-06 DIAGNOSIS — N186 End stage renal disease: Secondary | ICD-10-CM | POA: Diagnosis not present

## 2024-02-06 DIAGNOSIS — Z992 Dependence on renal dialysis: Secondary | ICD-10-CM | POA: Diagnosis not present

## 2024-02-06 DIAGNOSIS — D631 Anemia in chronic kidney disease: Secondary | ICD-10-CM | POA: Diagnosis not present

## 2024-02-06 DIAGNOSIS — N2581 Secondary hyperparathyroidism of renal origin: Secondary | ICD-10-CM | POA: Diagnosis not present

## 2024-02-09 DIAGNOSIS — N2581 Secondary hyperparathyroidism of renal origin: Secondary | ICD-10-CM | POA: Diagnosis not present

## 2024-02-09 DIAGNOSIS — N186 End stage renal disease: Secondary | ICD-10-CM | POA: Diagnosis not present

## 2024-02-09 DIAGNOSIS — D631 Anemia in chronic kidney disease: Secondary | ICD-10-CM | POA: Diagnosis not present

## 2024-02-09 DIAGNOSIS — Z992 Dependence on renal dialysis: Secondary | ICD-10-CM | POA: Diagnosis not present

## 2024-02-11 DIAGNOSIS — Z992 Dependence on renal dialysis: Secondary | ICD-10-CM | POA: Diagnosis not present

## 2024-02-11 DIAGNOSIS — N2581 Secondary hyperparathyroidism of renal origin: Secondary | ICD-10-CM | POA: Diagnosis not present

## 2024-02-11 DIAGNOSIS — N186 End stage renal disease: Secondary | ICD-10-CM | POA: Diagnosis not present

## 2024-02-11 DIAGNOSIS — D631 Anemia in chronic kidney disease: Secondary | ICD-10-CM | POA: Diagnosis not present

## 2024-02-13 DIAGNOSIS — N186 End stage renal disease: Secondary | ICD-10-CM | POA: Diagnosis not present

## 2024-02-13 DIAGNOSIS — Z992 Dependence on renal dialysis: Secondary | ICD-10-CM | POA: Diagnosis not present

## 2024-02-13 DIAGNOSIS — N2581 Secondary hyperparathyroidism of renal origin: Secondary | ICD-10-CM | POA: Diagnosis not present

## 2024-02-13 DIAGNOSIS — D631 Anemia in chronic kidney disease: Secondary | ICD-10-CM | POA: Diagnosis not present

## 2024-02-16 DIAGNOSIS — N186 End stage renal disease: Secondary | ICD-10-CM | POA: Diagnosis not present

## 2024-02-16 DIAGNOSIS — N2581 Secondary hyperparathyroidism of renal origin: Secondary | ICD-10-CM | POA: Diagnosis not present

## 2024-02-16 DIAGNOSIS — Z992 Dependence on renal dialysis: Secondary | ICD-10-CM | POA: Diagnosis not present

## 2024-02-16 DIAGNOSIS — D631 Anemia in chronic kidney disease: Secondary | ICD-10-CM | POA: Diagnosis not present

## 2024-02-17 DIAGNOSIS — Z961 Presence of intraocular lens: Secondary | ICD-10-CM | POA: Diagnosis not present

## 2024-02-17 DIAGNOSIS — H348312 Tributary (branch) retinal vein occlusion, right eye, stable: Secondary | ICD-10-CM | POA: Diagnosis not present

## 2024-02-17 DIAGNOSIS — H52203 Unspecified astigmatism, bilateral: Secondary | ICD-10-CM | POA: Diagnosis not present

## 2024-02-18 DIAGNOSIS — N2581 Secondary hyperparathyroidism of renal origin: Secondary | ICD-10-CM | POA: Diagnosis not present

## 2024-02-18 DIAGNOSIS — N186 End stage renal disease: Secondary | ICD-10-CM | POA: Diagnosis not present

## 2024-02-18 DIAGNOSIS — D631 Anemia in chronic kidney disease: Secondary | ICD-10-CM | POA: Diagnosis not present

## 2024-02-18 DIAGNOSIS — Z992 Dependence on renal dialysis: Secondary | ICD-10-CM | POA: Diagnosis not present

## 2024-02-20 DIAGNOSIS — Z992 Dependence on renal dialysis: Secondary | ICD-10-CM | POA: Diagnosis not present

## 2024-02-20 DIAGNOSIS — N186 End stage renal disease: Secondary | ICD-10-CM | POA: Diagnosis not present

## 2024-02-20 DIAGNOSIS — N2581 Secondary hyperparathyroidism of renal origin: Secondary | ICD-10-CM | POA: Diagnosis not present

## 2024-02-20 DIAGNOSIS — D631 Anemia in chronic kidney disease: Secondary | ICD-10-CM | POA: Diagnosis not present

## 2024-02-23 DIAGNOSIS — Z992 Dependence on renal dialysis: Secondary | ICD-10-CM | POA: Diagnosis not present

## 2024-02-23 DIAGNOSIS — N186 End stage renal disease: Secondary | ICD-10-CM | POA: Diagnosis not present

## 2024-02-23 DIAGNOSIS — N2581 Secondary hyperparathyroidism of renal origin: Secondary | ICD-10-CM | POA: Diagnosis not present

## 2024-02-23 DIAGNOSIS — D631 Anemia in chronic kidney disease: Secondary | ICD-10-CM | POA: Diagnosis not present

## 2024-02-25 DIAGNOSIS — D631 Anemia in chronic kidney disease: Secondary | ICD-10-CM | POA: Diagnosis not present

## 2024-02-25 DIAGNOSIS — N2581 Secondary hyperparathyroidism of renal origin: Secondary | ICD-10-CM | POA: Diagnosis not present

## 2024-02-25 DIAGNOSIS — Z992 Dependence on renal dialysis: Secondary | ICD-10-CM | POA: Diagnosis not present

## 2024-02-25 DIAGNOSIS — N186 End stage renal disease: Secondary | ICD-10-CM | POA: Diagnosis not present

## 2024-02-27 DIAGNOSIS — N2581 Secondary hyperparathyroidism of renal origin: Secondary | ICD-10-CM | POA: Diagnosis not present

## 2024-02-27 DIAGNOSIS — N186 End stage renal disease: Secondary | ICD-10-CM | POA: Diagnosis not present

## 2024-02-27 DIAGNOSIS — Z992 Dependence on renal dialysis: Secondary | ICD-10-CM | POA: Diagnosis not present

## 2024-02-27 DIAGNOSIS — D631 Anemia in chronic kidney disease: Secondary | ICD-10-CM | POA: Diagnosis not present

## 2024-03-01 DIAGNOSIS — N186 End stage renal disease: Secondary | ICD-10-CM | POA: Diagnosis not present

## 2024-03-01 DIAGNOSIS — D631 Anemia in chronic kidney disease: Secondary | ICD-10-CM | POA: Diagnosis not present

## 2024-03-01 DIAGNOSIS — N2581 Secondary hyperparathyroidism of renal origin: Secondary | ICD-10-CM | POA: Diagnosis not present

## 2024-03-01 DIAGNOSIS — Z992 Dependence on renal dialysis: Secondary | ICD-10-CM | POA: Diagnosis not present

## 2024-03-03 DIAGNOSIS — N186 End stage renal disease: Secondary | ICD-10-CM | POA: Diagnosis not present

## 2024-03-03 DIAGNOSIS — Z992 Dependence on renal dialysis: Secondary | ICD-10-CM | POA: Diagnosis not present

## 2024-03-03 DIAGNOSIS — N2581 Secondary hyperparathyroidism of renal origin: Secondary | ICD-10-CM | POA: Diagnosis not present

## 2024-03-03 DIAGNOSIS — D631 Anemia in chronic kidney disease: Secondary | ICD-10-CM | POA: Diagnosis not present

## 2024-03-05 DIAGNOSIS — E875 Hyperkalemia: Secondary | ICD-10-CM | POA: Diagnosis not present

## 2024-03-05 DIAGNOSIS — I129 Hypertensive chronic kidney disease with stage 1 through stage 4 chronic kidney disease, or unspecified chronic kidney disease: Secondary | ICD-10-CM | POA: Diagnosis not present

## 2024-03-05 DIAGNOSIS — D631 Anemia in chronic kidney disease: Secondary | ICD-10-CM | POA: Diagnosis not present

## 2024-03-05 DIAGNOSIS — N2581 Secondary hyperparathyroidism of renal origin: Secondary | ICD-10-CM | POA: Diagnosis not present

## 2024-03-05 DIAGNOSIS — Z992 Dependence on renal dialysis: Secondary | ICD-10-CM | POA: Diagnosis not present

## 2024-03-05 DIAGNOSIS — N186 End stage renal disease: Secondary | ICD-10-CM | POA: Diagnosis not present

## 2024-03-08 DIAGNOSIS — N2581 Secondary hyperparathyroidism of renal origin: Secondary | ICD-10-CM | POA: Diagnosis not present

## 2024-03-08 DIAGNOSIS — N186 End stage renal disease: Secondary | ICD-10-CM | POA: Diagnosis not present

## 2024-03-08 DIAGNOSIS — D631 Anemia in chronic kidney disease: Secondary | ICD-10-CM | POA: Diagnosis not present

## 2024-03-08 DIAGNOSIS — E875 Hyperkalemia: Secondary | ICD-10-CM | POA: Diagnosis not present

## 2024-03-08 DIAGNOSIS — Z992 Dependence on renal dialysis: Secondary | ICD-10-CM | POA: Diagnosis not present

## 2024-03-10 DIAGNOSIS — E875 Hyperkalemia: Secondary | ICD-10-CM | POA: Diagnosis not present

## 2024-03-10 DIAGNOSIS — D631 Anemia in chronic kidney disease: Secondary | ICD-10-CM | POA: Diagnosis not present

## 2024-03-10 DIAGNOSIS — N186 End stage renal disease: Secondary | ICD-10-CM | POA: Diagnosis not present

## 2024-03-10 DIAGNOSIS — Z992 Dependence on renal dialysis: Secondary | ICD-10-CM | POA: Diagnosis not present

## 2024-03-10 DIAGNOSIS — N2581 Secondary hyperparathyroidism of renal origin: Secondary | ICD-10-CM | POA: Diagnosis not present

## 2024-03-12 DIAGNOSIS — D631 Anemia in chronic kidney disease: Secondary | ICD-10-CM | POA: Diagnosis not present

## 2024-03-12 DIAGNOSIS — E875 Hyperkalemia: Secondary | ICD-10-CM | POA: Diagnosis not present

## 2024-03-12 DIAGNOSIS — N186 End stage renal disease: Secondary | ICD-10-CM | POA: Diagnosis not present

## 2024-03-12 DIAGNOSIS — Z992 Dependence on renal dialysis: Secondary | ICD-10-CM | POA: Diagnosis not present

## 2024-03-12 DIAGNOSIS — N2581 Secondary hyperparathyroidism of renal origin: Secondary | ICD-10-CM | POA: Diagnosis not present

## 2024-03-15 DIAGNOSIS — E875 Hyperkalemia: Secondary | ICD-10-CM | POA: Diagnosis not present

## 2024-03-15 DIAGNOSIS — N186 End stage renal disease: Secondary | ICD-10-CM | POA: Diagnosis not present

## 2024-03-15 DIAGNOSIS — N2581 Secondary hyperparathyroidism of renal origin: Secondary | ICD-10-CM | POA: Diagnosis not present

## 2024-03-15 DIAGNOSIS — D631 Anemia in chronic kidney disease: Secondary | ICD-10-CM | POA: Diagnosis not present

## 2024-03-15 DIAGNOSIS — Z992 Dependence on renal dialysis: Secondary | ICD-10-CM | POA: Diagnosis not present

## 2024-03-17 DIAGNOSIS — Z992 Dependence on renal dialysis: Secondary | ICD-10-CM | POA: Diagnosis not present

## 2024-03-17 DIAGNOSIS — D631 Anemia in chronic kidney disease: Secondary | ICD-10-CM | POA: Diagnosis not present

## 2024-03-17 DIAGNOSIS — E875 Hyperkalemia: Secondary | ICD-10-CM | POA: Diagnosis not present

## 2024-03-17 DIAGNOSIS — N186 End stage renal disease: Secondary | ICD-10-CM | POA: Diagnosis not present

## 2024-03-17 DIAGNOSIS — N2581 Secondary hyperparathyroidism of renal origin: Secondary | ICD-10-CM | POA: Diagnosis not present

## 2024-03-19 DIAGNOSIS — D631 Anemia in chronic kidney disease: Secondary | ICD-10-CM | POA: Diagnosis not present

## 2024-03-19 DIAGNOSIS — N2581 Secondary hyperparathyroidism of renal origin: Secondary | ICD-10-CM | POA: Diagnosis not present

## 2024-03-19 DIAGNOSIS — N186 End stage renal disease: Secondary | ICD-10-CM | POA: Diagnosis not present

## 2024-03-19 DIAGNOSIS — Z992 Dependence on renal dialysis: Secondary | ICD-10-CM | POA: Diagnosis not present

## 2024-03-19 DIAGNOSIS — E875 Hyperkalemia: Secondary | ICD-10-CM | POA: Diagnosis not present

## 2024-03-22 DIAGNOSIS — Z992 Dependence on renal dialysis: Secondary | ICD-10-CM | POA: Diagnosis not present

## 2024-03-22 DIAGNOSIS — D631 Anemia in chronic kidney disease: Secondary | ICD-10-CM | POA: Diagnosis not present

## 2024-03-22 DIAGNOSIS — N186 End stage renal disease: Secondary | ICD-10-CM | POA: Diagnosis not present

## 2024-03-22 DIAGNOSIS — N2581 Secondary hyperparathyroidism of renal origin: Secondary | ICD-10-CM | POA: Diagnosis not present

## 2024-03-22 DIAGNOSIS — E875 Hyperkalemia: Secondary | ICD-10-CM | POA: Diagnosis not present

## 2024-03-24 DIAGNOSIS — N2581 Secondary hyperparathyroidism of renal origin: Secondary | ICD-10-CM | POA: Diagnosis not present

## 2024-03-24 DIAGNOSIS — D631 Anemia in chronic kidney disease: Secondary | ICD-10-CM | POA: Diagnosis not present

## 2024-03-24 DIAGNOSIS — Z992 Dependence on renal dialysis: Secondary | ICD-10-CM | POA: Diagnosis not present

## 2024-03-24 DIAGNOSIS — E875 Hyperkalemia: Secondary | ICD-10-CM | POA: Diagnosis not present

## 2024-03-24 DIAGNOSIS — N186 End stage renal disease: Secondary | ICD-10-CM | POA: Diagnosis not present

## 2024-03-26 DIAGNOSIS — D631 Anemia in chronic kidney disease: Secondary | ICD-10-CM | POA: Diagnosis not present

## 2024-03-26 DIAGNOSIS — E875 Hyperkalemia: Secondary | ICD-10-CM | POA: Diagnosis not present

## 2024-03-26 DIAGNOSIS — Z992 Dependence on renal dialysis: Secondary | ICD-10-CM | POA: Diagnosis not present

## 2024-03-26 DIAGNOSIS — N186 End stage renal disease: Secondary | ICD-10-CM | POA: Diagnosis not present

## 2024-03-26 DIAGNOSIS — N2581 Secondary hyperparathyroidism of renal origin: Secondary | ICD-10-CM | POA: Diagnosis not present

## 2024-03-28 DIAGNOSIS — Z992 Dependence on renal dialysis: Secondary | ICD-10-CM | POA: Diagnosis not present

## 2024-03-28 DIAGNOSIS — N186 End stage renal disease: Secondary | ICD-10-CM | POA: Diagnosis not present

## 2024-03-28 DIAGNOSIS — D631 Anemia in chronic kidney disease: Secondary | ICD-10-CM | POA: Diagnosis not present

## 2024-03-28 DIAGNOSIS — N2581 Secondary hyperparathyroidism of renal origin: Secondary | ICD-10-CM | POA: Diagnosis not present

## 2024-03-28 DIAGNOSIS — E875 Hyperkalemia: Secondary | ICD-10-CM | POA: Diagnosis not present

## 2024-03-30 DIAGNOSIS — D631 Anemia in chronic kidney disease: Secondary | ICD-10-CM | POA: Diagnosis not present

## 2024-03-30 DIAGNOSIS — Z992 Dependence on renal dialysis: Secondary | ICD-10-CM | POA: Diagnosis not present

## 2024-03-30 DIAGNOSIS — E875 Hyperkalemia: Secondary | ICD-10-CM | POA: Diagnosis not present

## 2024-03-30 DIAGNOSIS — N2581 Secondary hyperparathyroidism of renal origin: Secondary | ICD-10-CM | POA: Diagnosis not present

## 2024-03-30 DIAGNOSIS — N186 End stage renal disease: Secondary | ICD-10-CM | POA: Diagnosis not present

## 2024-04-02 DIAGNOSIS — E875 Hyperkalemia: Secondary | ICD-10-CM | POA: Diagnosis not present

## 2024-04-02 DIAGNOSIS — D631 Anemia in chronic kidney disease: Secondary | ICD-10-CM | POA: Diagnosis not present

## 2024-04-02 DIAGNOSIS — Z992 Dependence on renal dialysis: Secondary | ICD-10-CM | POA: Diagnosis not present

## 2024-04-02 DIAGNOSIS — N2581 Secondary hyperparathyroidism of renal origin: Secondary | ICD-10-CM | POA: Diagnosis not present

## 2024-04-02 DIAGNOSIS — N186 End stage renal disease: Secondary | ICD-10-CM | POA: Diagnosis not present

## 2024-06-08 ENCOUNTER — Ambulatory Visit

## 2024-06-08 ENCOUNTER — Encounter: Payer: Self-pay | Admitting: Nurse Practitioner

## 2024-06-08 VITALS — BP 160/80 | HR 71 | Ht 64.0 in | Wt 115.0 lb

## 2024-06-08 DIAGNOSIS — N186 End stage renal disease: Secondary | ICD-10-CM

## 2024-06-08 DIAGNOSIS — R011 Cardiac murmur, unspecified: Secondary | ICD-10-CM

## 2024-06-08 DIAGNOSIS — I5032 Chronic diastolic (congestive) heart failure: Secondary | ICD-10-CM

## 2024-06-08 DIAGNOSIS — I10 Essential (primary) hypertension: Secondary | ICD-10-CM

## 2024-06-08 DIAGNOSIS — Z992 Dependence on renal dialysis: Secondary | ICD-10-CM

## 2024-06-08 NOTE — Patient Instructions (Signed)
 Medication Instructions:  Your physician recommends that you continue on your current medications as directed. Please refer to the Current Medication list given to you today.  *If you need a refill on your cardiac medications before your next appointment, please call your pharmacy*  Lab Work: NONE ordered at this time of appointment   Testing/Procedures: NONE ordered at this time of appointment   Follow-Up: At [facility], you and your health needs are our priority.  As part of our continuing mission to provide you with exceptional heart care, our providers are all part of one team.  This team includes your primary Cardiologist (physician) and Advanced Practice Providers or APPs (Physician Assistants and Nurse Practitioners) who all work together to provide you with the care you need, when you need it.  Your next appointment:   6 month(s)  Provider:   Annabella Scarce, MD    We recommend signing up for the patient portal called MyChart.  Sign up information is provided on this After Visit Summary.  MyChart is used to connect with patients for Virtual Visits (Telemedicine).  Patients are able to view lab/test results, encounter notes, upcoming appointments, etc.  Non-urgent messages can be sent to your provider as well.   To learn more about what you can do with MyChart, go to [email or web address].   Other Instructions
